# Patient Record
Sex: Female | Born: 1935 | Race: White | Hispanic: No | Smoking: Never smoker
Health system: Southern US, Community
[De-identification: ages and names within clinical notes are randomized; demographics above are authoritative.]

## PROBLEM LIST (undated history)

## (undated) DIAGNOSIS — I10 Essential (primary) hypertension: Secondary | ICD-10-CM

## (undated) DIAGNOSIS — R079 Chest pain, unspecified: Secondary | ICD-10-CM

## (undated) DIAGNOSIS — D126 Benign neoplasm of colon, unspecified: Secondary | ICD-10-CM

## (undated) DIAGNOSIS — R0602 Shortness of breath: Secondary | ICD-10-CM

## (undated) DIAGNOSIS — T8859XA Other complications of anesthesia, initial encounter: Secondary | ICD-10-CM

## (undated) DIAGNOSIS — K219 Gastro-esophageal reflux disease without esophagitis: Secondary | ICD-10-CM

## (undated) DIAGNOSIS — G4730 Sleep apnea, unspecified: Secondary | ICD-10-CM

## (undated) DIAGNOSIS — Z87442 Personal history of urinary calculi: Secondary | ICD-10-CM

## (undated) DIAGNOSIS — Z79899 Other long term (current) drug therapy: Secondary | ICD-10-CM

## (undated) DIAGNOSIS — L719 Rosacea, unspecified: Secondary | ICD-10-CM

## (undated) DIAGNOSIS — I519 Heart disease, unspecified: Secondary | ICD-10-CM

## (undated) DIAGNOSIS — I509 Heart failure, unspecified: Secondary | ICD-10-CM

## (undated) DIAGNOSIS — N200 Calculus of kidney: Principal | ICD-10-CM

## (undated) DIAGNOSIS — E785 Hyperlipidemia, unspecified: Secondary | ICD-10-CM

## (undated) DIAGNOSIS — J45909 Unspecified asthma, uncomplicated: Secondary | ICD-10-CM

## (undated) DIAGNOSIS — G5600 Carpal tunnel syndrome, unspecified upper limb: Secondary | ICD-10-CM

## (undated) DIAGNOSIS — IMO0002 Reserved for concepts with insufficient information to code with codable children: Secondary | ICD-10-CM

## (undated) DIAGNOSIS — E669 Obesity, unspecified: Secondary | ICD-10-CM

## (undated) HISTORY — DX: Heart disease, unspecified: I51.9

## (undated) HISTORY — DX: Carpal tunnel syndrome, unspecified upper limb: G56.00

## (undated) HISTORY — DX: Hyperlipidemia, unspecified: E78.5

## (undated) HISTORY — DX: Calculus of kidney: N20.0

## (undated) HISTORY — PX: TONSILLECTOMY: SUR1361

## (undated) HISTORY — DX: Reserved for concepts with insufficient information to code with codable children: IMO0002

## (undated) HISTORY — PX: ABDOMINAL HYSTERECTOMY: SHX81

## (undated) HISTORY — DX: Sleep apnea, unspecified: G47.30

## (undated) HISTORY — DX: Essential (primary) hypertension: I10

## (undated) HISTORY — DX: Gastro-esophageal reflux disease without esophagitis: K21.9

## (undated) HISTORY — DX: Benign neoplasm of colon, unspecified: D12.6

## (undated) HISTORY — DX: Unspecified asthma, uncomplicated: J45.909

## (undated) HISTORY — DX: Other long term (current) drug therapy: Z79.899

## (undated) HISTORY — DX: Obesity, unspecified: E66.9

## (undated) HISTORY — DX: Heart failure, unspecified: I50.9

## (undated) HISTORY — DX: Shortness of breath: R06.02

## (undated) HISTORY — DX: Rosacea, unspecified: L71.9

## (undated) HISTORY — DX: Chest pain, unspecified: R07.9

---

## 1998-03-27 HISTORY — PX: ESOPHAGOGASTRODUODENOSCOPY: SHX1529

## 1999-03-28 LAB — HM DEXA SCAN: HM Dexa Scan: NORMAL

## 2001-03-27 DIAGNOSIS — D126 Benign neoplasm of colon, unspecified: Secondary | ICD-10-CM

## 2001-03-27 HISTORY — DX: Benign neoplasm of colon, unspecified: D12.6

## 2002-04-27 ENCOUNTER — Encounter: Payer: Self-pay | Admitting: Family Medicine

## 2002-04-27 LAB — CONVERTED CEMR LAB

## 2002-05-27 LAB — HM MAMMOGRAPHY: HM Mammogram: NORMAL

## 2004-01-26 HISTORY — PX: UMBILICAL HERNIA REPAIR: SHX196

## 2004-01-28 ENCOUNTER — Ambulatory Visit: Payer: Self-pay | Admitting: General Surgery

## 2004-01-28 ENCOUNTER — Other Ambulatory Visit: Payer: Self-pay

## 2004-02-04 ENCOUNTER — Ambulatory Visit: Payer: Self-pay | Admitting: General Surgery

## 2004-03-03 ENCOUNTER — Ambulatory Visit: Payer: Self-pay | Admitting: Family Medicine

## 2004-03-31 ENCOUNTER — Ambulatory Visit: Payer: Self-pay | Admitting: Family Medicine

## 2004-05-17 ENCOUNTER — Ambulatory Visit: Payer: Self-pay | Admitting: Internal Medicine

## 2004-06-03 ENCOUNTER — Ambulatory Visit: Payer: Self-pay | Admitting: Internal Medicine

## 2004-10-03 ENCOUNTER — Ambulatory Visit: Payer: Self-pay | Admitting: Family Medicine

## 2005-02-15 ENCOUNTER — Ambulatory Visit: Payer: Self-pay | Admitting: Family Medicine

## 2005-02-27 ENCOUNTER — Ambulatory Visit: Payer: Self-pay | Admitting: Family Medicine

## 2005-03-27 ENCOUNTER — Ambulatory Visit: Payer: Self-pay | Admitting: Family Medicine

## 2005-04-12 ENCOUNTER — Ambulatory Visit: Payer: Self-pay | Admitting: Family Medicine

## 2005-04-27 ENCOUNTER — Ambulatory Visit: Payer: Self-pay | Admitting: Family Medicine

## 2005-05-26 ENCOUNTER — Ambulatory Visit: Payer: Self-pay | Admitting: Family Medicine

## 2005-06-07 ENCOUNTER — Ambulatory Visit: Payer: Self-pay | Admitting: Family Medicine

## 2005-07-20 ENCOUNTER — Ambulatory Visit: Payer: Self-pay | Admitting: Family Medicine

## 2005-10-03 ENCOUNTER — Ambulatory Visit: Payer: Self-pay | Admitting: Family Medicine

## 2005-10-12 ENCOUNTER — Ambulatory Visit: Payer: Self-pay | Admitting: Cardiology

## 2005-10-25 ENCOUNTER — Encounter: Payer: Self-pay | Admitting: Cardiology

## 2005-10-25 ENCOUNTER — Ambulatory Visit: Payer: Self-pay

## 2006-02-01 ENCOUNTER — Ambulatory Visit: Payer: Self-pay | Admitting: Family Medicine

## 2006-05-03 ENCOUNTER — Ambulatory Visit: Payer: Self-pay | Admitting: Family Medicine

## 2006-05-03 LAB — CONVERTED CEMR LAB
Albumin: 3.7 g/dL (ref 3.5–5.2)
Chloride: 101 meq/L (ref 96–112)
GFR calc Af Amer: 127 mL/min
GFR calc non Af Amer: 105 mL/min
Glucose, Bld: 126 mg/dL — ABNORMAL HIGH (ref 70–99)
Hgb A1c MFr Bld: 7.1 %
LDL Cholesterol: 53 mg/dL (ref 0–99)
Phosphorus: 2.8 mg/dL (ref 2.3–4.6)
Potassium: 3.6 meq/L (ref 3.5–5.1)
Sodium: 143 meq/L (ref 135–145)
Total CHOL/HDL Ratio: 2.9
Triglycerides: 151 mg/dL — ABNORMAL HIGH (ref 0–149)
VLDL: 30 mg/dL (ref 0–40)

## 2006-07-12 ENCOUNTER — Ambulatory Visit: Payer: Self-pay | Admitting: Family Medicine

## 2006-09-06 ENCOUNTER — Ambulatory Visit: Payer: Self-pay | Admitting: Family Medicine

## 2006-09-06 DIAGNOSIS — E1129 Type 2 diabetes mellitus with other diabetic kidney complication: Secondary | ICD-10-CM | POA: Insufficient documentation

## 2006-09-06 DIAGNOSIS — E119 Type 2 diabetes mellitus without complications: Secondary | ICD-10-CM

## 2006-09-10 ENCOUNTER — Encounter: Payer: Self-pay | Admitting: Family Medicine

## 2006-09-10 DIAGNOSIS — G4733 Obstructive sleep apnea (adult) (pediatric): Secondary | ICD-10-CM

## 2006-09-10 DIAGNOSIS — M5137 Other intervertebral disc degeneration, lumbosacral region: Secondary | ICD-10-CM

## 2006-09-10 DIAGNOSIS — J45909 Unspecified asthma, uncomplicated: Secondary | ICD-10-CM | POA: Insufficient documentation

## 2006-09-10 DIAGNOSIS — Z860100 Personal history of colon polyps, unspecified: Secondary | ICD-10-CM | POA: Insufficient documentation

## 2006-09-10 DIAGNOSIS — K219 Gastro-esophageal reflux disease without esophagitis: Secondary | ICD-10-CM

## 2006-09-10 DIAGNOSIS — E6609 Other obesity due to excess calories: Secondary | ICD-10-CM | POA: Insufficient documentation

## 2006-09-10 DIAGNOSIS — L719 Rosacea, unspecified: Secondary | ICD-10-CM

## 2006-09-10 DIAGNOSIS — E8770 Fluid overload, unspecified: Secondary | ICD-10-CM | POA: Insufficient documentation

## 2006-09-10 DIAGNOSIS — I10 Essential (primary) hypertension: Secondary | ICD-10-CM | POA: Insufficient documentation

## 2006-09-10 DIAGNOSIS — E66811 Obesity, class 1: Secondary | ICD-10-CM | POA: Insufficient documentation

## 2006-09-10 DIAGNOSIS — G5600 Carpal tunnel syndrome, unspecified upper limb: Secondary | ICD-10-CM

## 2006-09-10 DIAGNOSIS — I519 Heart disease, unspecified: Secondary | ICD-10-CM

## 2006-09-10 DIAGNOSIS — M51379 Other intervertebral disc degeneration, lumbosacral region without mention of lumbar back pain or lower extremity pain: Secondary | ICD-10-CM | POA: Insufficient documentation

## 2006-09-10 LAB — CONVERTED CEMR LAB
Calcium: 9.9 mg/dL (ref 8.4–10.5)
Chloride: 103 meq/L (ref 96–112)
Creatinine, Ser: 0.8 mg/dL (ref 0.4–1.2)
Glucose, Bld: 141 mg/dL — ABNORMAL HIGH (ref 70–99)
Sodium: 140 meq/L (ref 135–145)

## 2006-09-13 ENCOUNTER — Ambulatory Visit: Payer: Self-pay | Admitting: Family Medicine

## 2006-09-13 DIAGNOSIS — E1169 Type 2 diabetes mellitus with other specified complication: Secondary | ICD-10-CM

## 2006-09-13 DIAGNOSIS — E785 Hyperlipidemia, unspecified: Secondary | ICD-10-CM

## 2006-12-20 ENCOUNTER — Ambulatory Visit: Payer: Self-pay | Admitting: Family Medicine

## 2007-01-01 LAB — CONVERTED CEMR LAB
ALT: 23 units/L (ref 0–35)
AST: 18 units/L (ref 0–37)
Cholesterol: 111 mg/dL (ref 0–200)
Creatinine,U: 178.9 mg/dL
Hgb A1c MFr Bld: 6.7 % — ABNORMAL HIGH (ref 4.6–6.0)
Microalb, Ur: 2.4 mg/dL — ABNORMAL HIGH (ref 0.0–1.9)
Total CHOL/HDL Ratio: 2.6
Triglycerides: 112 mg/dL (ref 0–149)

## 2007-05-17 ENCOUNTER — Telehealth: Payer: Self-pay | Admitting: Family Medicine

## 2007-05-17 ENCOUNTER — Encounter: Payer: Self-pay | Admitting: Family Medicine

## 2007-10-07 ENCOUNTER — Ambulatory Visit: Payer: Self-pay | Admitting: Family Medicine

## 2007-10-08 LAB — CONVERTED CEMR LAB
ALT: 29 units/L (ref 0–35)
Albumin: 4.2 g/dL (ref 3.5–5.2)
Chloride: 108 meq/L (ref 96–112)
Creatinine,U: 128 mg/dL
Eosinophils Absolute: 0.2 10*3/uL (ref 0.0–0.7)
Eosinophils Relative: 2.6 % (ref 0.0–5.0)
GFR calc Af Amer: 106 mL/min
GFR calc non Af Amer: 88 mL/min
HCT: 41.4 % (ref 36.0–46.0)
HDL: 48.5 mg/dL (ref >39.0)
Hemoglobin: 14.4 g/dL (ref 12.0–15.0)
MCV: 93.1 fL (ref 78.0–100.0)
Microalb Creat Ratio: 37.5 mg/g — ABNORMAL HIGH (ref 0.0–30.0)
Microalb, Ur: 4.8 mg/dL — ABNORMAL HIGH (ref 0.0–1.9)
Monocytes Absolute: 0.4 10*3/uL (ref 0.1–1.0)
Monocytes Relative: 6.5 % (ref 3.0–12.0)
Neutro Abs: 3.9 10*3/uL (ref 1.4–7.7)
Phosphorus: 4.3 mg/dL (ref 2.3–4.6)
Platelets: 194 10*3/uL (ref 150–400)
Potassium: 4.1 meq/L (ref 3.5–5.1)
Sodium: 143 meq/L (ref 135–145)
Total CHOL/HDL Ratio: 2.5
Total Protein: 7 g/dL (ref 6.0–8.3)
Triglycerides: 101 mg/dL (ref 0–149)
WBC: 5.8 10*3/uL (ref 4.5–10.5)

## 2007-10-22 ENCOUNTER — Encounter: Payer: Self-pay | Admitting: Family Medicine

## 2007-10-31 ENCOUNTER — Telehealth (INDEPENDENT_AMBULATORY_CARE_PROVIDER_SITE_OTHER): Payer: Self-pay | Admitting: *Deleted

## 2007-12-10 ENCOUNTER — Ambulatory Visit: Payer: Self-pay | Admitting: Family Medicine

## 2008-01-14 ENCOUNTER — Ambulatory Visit: Payer: Self-pay | Admitting: Family Medicine

## 2008-01-15 LAB — CONVERTED CEMR LAB
Creatinine,U: 23.3 mg/dL
Hgb A1c MFr Bld: 6.7 % — ABNORMAL HIGH (ref 4.6–6.0)

## 2008-02-28 ENCOUNTER — Telehealth: Payer: Self-pay | Admitting: Family Medicine

## 2008-04-02 ENCOUNTER — Encounter: Payer: Self-pay | Admitting: Family Medicine

## 2008-05-25 ENCOUNTER — Encounter: Payer: Self-pay | Admitting: Family Medicine

## 2008-11-03 ENCOUNTER — Telehealth: Payer: Self-pay | Admitting: Family Medicine

## 2008-11-04 ENCOUNTER — Ambulatory Visit: Payer: Self-pay | Admitting: Family Medicine

## 2008-11-04 DIAGNOSIS — D126 Benign neoplasm of colon, unspecified: Secondary | ICD-10-CM | POA: Insufficient documentation

## 2008-11-05 LAB — CONVERTED CEMR LAB
ALT: 31 units/L (ref 0–35)
Bilirubin, Direct: 0.2 mg/dL (ref 0.0–0.3)
CO2: 32 meq/L (ref 19–32)
Chloride: 107 meq/L (ref 96–112)
Cholesterol: 123 mg/dL (ref 0–200)
Creatinine, Ser: 0.8 mg/dL (ref 0.4–1.2)
LDL Cholesterol: 44 mg/dL (ref 0–99)
Phosphorus: 3.4 mg/dL (ref 2.3–4.6)
Sodium: 144 meq/L (ref 135–145)
Total Bilirubin: 1.3 mg/dL — ABNORMAL HIGH (ref 0.3–1.2)
Total CHOL/HDL Ratio: 3
Triglycerides: 169 mg/dL — ABNORMAL HIGH (ref 0.0–149.0)

## 2008-11-06 ENCOUNTER — Telehealth: Payer: Self-pay | Admitting: Family Medicine

## 2008-12-04 ENCOUNTER — Encounter: Payer: Self-pay | Admitting: Family Medicine

## 2009-02-15 ENCOUNTER — Telehealth (INDEPENDENT_AMBULATORY_CARE_PROVIDER_SITE_OTHER): Payer: Self-pay | Admitting: *Deleted

## 2009-03-24 ENCOUNTER — Telehealth: Payer: Self-pay | Admitting: Family Medicine

## 2009-05-10 ENCOUNTER — Ambulatory Visit: Payer: Self-pay | Admitting: Family Medicine

## 2009-05-11 ENCOUNTER — Encounter (INDEPENDENT_AMBULATORY_CARE_PROVIDER_SITE_OTHER): Payer: Self-pay | Admitting: *Deleted

## 2009-05-11 LAB — CONVERTED CEMR LAB
Albumin: 3.9 g/dL (ref 3.5–5.2)
BUN: 13 mg/dL (ref 6–23)
Chloride: 109 meq/L (ref 96–112)
Creatinine, Ser: 0.8 mg/dL (ref 0.4–1.2)
GFR calc non Af Amer: 74.83 mL/min (ref >60)
Glucose, Bld: 156 mg/dL — ABNORMAL HIGH (ref 70–99)
Hgb A1c MFr Bld: 7.3 % — ABNORMAL HIGH (ref 4.6–6.5)
LDL Cholesterol: 44 mg/dL (ref 0–99)
Phosphorus: 3.5 mg/dL (ref 2.3–4.6)
Total CHOL/HDL Ratio: 2
Triglycerides: 113 mg/dL (ref 0.0–149.0)
VLDL: 22.6 mg/dL (ref 0.0–40.0)

## 2009-05-12 ENCOUNTER — Ambulatory Visit: Payer: Self-pay | Admitting: Internal Medicine

## 2009-05-12 ENCOUNTER — Encounter (INDEPENDENT_AMBULATORY_CARE_PROVIDER_SITE_OTHER): Payer: Self-pay | Admitting: *Deleted

## 2009-05-26 ENCOUNTER — Ambulatory Visit: Payer: Self-pay | Admitting: Internal Medicine

## 2009-05-28 ENCOUNTER — Encounter: Payer: Self-pay | Admitting: Internal Medicine

## 2009-06-24 ENCOUNTER — Telehealth: Payer: Self-pay | Admitting: Family Medicine

## 2009-07-21 ENCOUNTER — Telehealth: Payer: Self-pay | Admitting: Family Medicine

## 2009-07-22 ENCOUNTER — Encounter: Payer: Self-pay | Admitting: Family Medicine

## 2009-07-23 ENCOUNTER — Ambulatory Visit: Payer: Self-pay | Admitting: Family Medicine

## 2009-07-23 DIAGNOSIS — R079 Chest pain, unspecified: Secondary | ICD-10-CM

## 2009-07-23 DIAGNOSIS — R0602 Shortness of breath: Secondary | ICD-10-CM

## 2009-07-23 LAB — HM DIABETES FOOT EXAM

## 2009-07-29 ENCOUNTER — Ambulatory Visit: Payer: Self-pay | Admitting: Cardiovascular Disease

## 2009-07-29 DIAGNOSIS — I5032 Chronic diastolic (congestive) heart failure: Secondary | ICD-10-CM

## 2009-08-04 ENCOUNTER — Ambulatory Visit: Payer: Self-pay | Admitting: Family Medicine

## 2009-08-05 LAB — CONVERTED CEMR LAB: Potassium: 4.5 meq/L (ref 3.5–5.1)

## 2009-08-20 ENCOUNTER — Encounter: Payer: Self-pay | Admitting: Cardiovascular Disease

## 2009-08-20 ENCOUNTER — Ambulatory Visit: Payer: Self-pay

## 2009-08-20 ENCOUNTER — Ambulatory Visit (HOSPITAL_COMMUNITY): Admission: RE | Admit: 2009-08-20 | Discharge: 2009-08-20 | Payer: Self-pay | Admitting: Cardiovascular Disease

## 2009-08-20 ENCOUNTER — Ambulatory Visit: Payer: Self-pay | Admitting: Cardiovascular Disease

## 2009-08-25 ENCOUNTER — Encounter (INDEPENDENT_AMBULATORY_CARE_PROVIDER_SITE_OTHER): Payer: Self-pay | Admitting: *Deleted

## 2009-08-25 ENCOUNTER — Telehealth: Payer: Self-pay | Admitting: Family Medicine

## 2009-09-10 ENCOUNTER — Telehealth: Payer: Self-pay | Admitting: Family Medicine

## 2009-11-25 ENCOUNTER — Encounter: Payer: Self-pay | Admitting: Family Medicine

## 2010-04-24 LAB — CONVERTED CEMR LAB
Albumin: 3.8 g/dL (ref 3.5–5.2)
BUN: 12 mg/dL (ref 6–23)
CO2: 28 meq/L (ref 19–32)
Calcium: 9.5 mg/dL (ref 8.4–10.5)
Chloride: 107 meq/L (ref 96–112)
Creatinine, Ser: 0.7 mg/dL (ref 0.4–1.2)

## 2010-04-26 NOTE — Letter (Signed)
Summary: [facility] Instructions  [facility]  [address] [place], [place] [address]   Phone: [phone]  Fax: [phone]       Gabriella Howe    Aug 01, 1934    MRN: [id number]       Procedure Day Dorna Bloom:Wednesday 05/26/2009     Arrival Time: 8:00 am     Procedure Time:  9:00 am    Location of Procedure:                    _ x_  [facility] (4th Floor)   PREPARATION FOR COLONOSCOPY WITH MIRALAX  Starting 5 days prior to your procedure Friday 2/25 do not eat nuts, seeds, popcorn, corn, beans, peas,  salads, or any raw vegetables.  Do not take any fiber supplements (e.g. Metamucil, Citrucel, and Benefiber). ____________________________________________________________________________________________________   THE DAY BEFORE YOUR PROCEDURE         DATE:Tuesday 3/1  1   Drink clear liquids the entire day-NO SOLID FOOD  2   Do not drink anything colored red or purple.  Avoid juices with pulp.  No orange juice.  3   Drink at least 64 oz. (8 glasses) of fluid/clear liquids during the day to prevent dehydration and help the prep work efficiently.  CLEAR LIQUIDS INCLUDE: Water Jello Ice Popsicles Tea (sugar ok, no milk/cream) Powdered fruit flavored drinks Coffee (sugar ok, no milk/cream) Gatorade Juice: apple, white grape, white cranberry  Lemonade Clear bullion, consomm, broth Carbonated beverages (any kind) Strained chicken noodle soup Hard Candy  4   Mix the entire bottle of Miralax with 64 oz. of Gatorade/Powerade in the morning and put in the refrigerator to chill.  5   At 3:00 pm take 2 Dulcolax/Bisacodyl tablets.  6   At 4:30 pm take one Reglan/Metoclopramide tablet.  7  Starting at 5:00 pm drink one 8 oz glass of the Miralax mixture every 15-20 minutes until you have finished drinking the entire 64 oz.  You should finish drinking prep around 7:30 or 8:00 pm.  8   If you are nauseated, you may take the 2nd Reglan/Metoclopramide tablet at  6:30 pm.        9    At 8:00 pm take 2 more DULCOLAX/Bisacodyl tablets.     THE DAY OF YOUR PROCEDURE      DATE:  Wednesday 3/2  You may drink clear liquids until 7:00 am   (2 HOURS BEFORE PROCEDURE).   MEDICATION INSTRUCTIONS  Unless otherwise instructed, you should take regular prescription medications with a small sip of water as early as possible the morning of your procedure.  Diabetic patients - see separate instructions.    Additional medication instructions:  do not take Lasix morning of procedure.         OTHER INSTRUCTIONS  You will need a responsible adult at least 74 years of age to accompany you and drive you home.   This person must remain in the waiting room during your procedure.  Wear loose fitting clothing that is easily removed.  Leave jewelry and other valuables at home.  However, you may wish to bring a book to read or an iPod/MP3 player to listen to music as you wait for your procedure to start.  Remove all body piercing jewelry and leave at home.  Total time from sign-in until discharge is approximately 2-3 hours.  You should go home directly after your procedure and rest.  You can resume normal activities the day  after your procedure.  The day of your procedure you should not:   Drive   Make legal decisions   Operate machinery   Drink alcohol   Return to work  You will receive specific instructions about eating, activities and medications before you leave.   The above instructions have been reviewed and explained to me by  Ezra Sites RN  May 12, 2009 2:58 PM   I fully understand and can verbalize these instructions _____________________________ Date _______

## 2010-04-26 NOTE — Assessment & Plan Note (Signed)
Summary: RETAINING FLUID/DLO   Vital Signs:  Patient profile:   76 year old female Height:      63 inches Weight:      228.25 pounds BMI:     40.58 O2 Sat:      95 % on Room air Temp:     98.4 degrees F oral Pulse rate:   76 / minute Pulse rhythm:   regular Resp:     20 per minute BP sitting:   110 / 70  (left arm) Cuff size:   large  Vitals Entered By: Lewanda Rife LPN (July 23, 2009 11:32 AM)  O2 Flow:  Room air CC: retaining fluid in legs and chest. Pt states upon exertion trouble breathing and feels pressure in chest like elephant sitting on chest.   History of Present Illness: here for fluid retention and sob  this started slowly for at least 3 weeks -- kept building and building  big ankles and feet - fingers are swollen  gained 10 lb by her scales (4 by ours)  chest is feeling heavy - no pain at all   sob gets worse when she exerts herself   has hx of chf - takes 20 lasix per day-- did take 40 yesterday - and has helped some - breathing and pressure are quite improved  has not taken any lasix today - has to bring mother home from the hospital (kidney fxn )  last echol 07 mild diast dysf, with nl nuc study (with [place])  no sweating , nausea, dizziness or other symptoms    last lipids LDL 44 on simvastatin   bp good 110/70  wt is up 4 lb  DM - last AIC 7.3 in feb  sugars run very well when not stressed    does not know what brought this on  did not eat salty food  has not been drinking enough water    Allergies: 1)  ! * Td Shot 2)  * Antihistamines 3)  Vioxx  Past History:  Past Medical History: Last updated: 09/10/2006 Colonic polyps, hx of Congestive heart failure Diabetes mellitus, type II GERD Hypertension  Past Surgical History: Last updated: 09/10/2006 Hysterectomy- partial, fibroids EGD (2000) Colonoscopy- polyps (2000,  2003),   negative (05/2004) Dexa- ok per pt (2001) Umbiical hernia repair (01/2004) [place]- fatty liver  (2005) Echo- normal EF 65-70%, mild diastolic dys, nuclear study normal (10/2005)  Family History: Last updated: 09/10/2006 Father: killed in accident Mother: HTN, DM Siblings:   Social History: Last updated: 10/07/2007 Marital Status: widowed Children: 1 daughter Occupation: retired non smoker no alcohol  Risk Factors: Smoking Status: never (09/10/2006)  Review of Systems General:  Complains of fatigue; denies chills, fever, loss of appetite, malaise, sweats, weakness, and weight loss. Eyes:  Denies blurring and eye pain. CV:  Denies chest pain or discomfort, palpitations, and shortness of breath with exertion. Resp:  Complains of shortness of breath; denies cough, pleuritic, sputum productive, and wheezing. GI:  Denies abdominal pain, bloody stools, change in bowel habits, indigestion, loss of appetite, and nausea. MS:  Denies muscle aches, cramps, and muscle weakness. Derm:  Denies lesion(s), poor wound healing, and rash. Neuro:  Denies headaches, inability to speak, numbness, tingling, and weakness. Psych:  is stressed about caring for her mother . Endo:  Denies cold intolerance, excessive thirst, excessive urination, and heat intolerance. Heme:  Denies abnormal bruising and bleeding.  Physical Exam  General:  overwt but well app not sob on exam  Head:  normocephalic, atraumatic, and no abnormalities observed.   Eyes:  vision grossly intact, pupils equal, pupils round, and pupils reactive to light.  no conjunctival pallor, injection or icterus  Neck:  supple with full rom and no masses or thyromegally, no JVD or carotid bruit  Chest Wall:  No deformities, masses, or tenderness noted. Lungs:  Normal respiratory effort, chest expands symmetrically. Lungs are clear to auscultation, no crackles or wheezes. Heart:  Normal rate and regular rhythm. S1 and S2 normal without gallop, murmur, click, rub or other extra sounds. Abdomen:  Bowel sounds positive,abdomen soft and  non-tender without masses, organomegaly or hernias noted. no renal bruits  Msk:  No deformity or scoliosis noted of thoracic or lumbar spine.   Pulses:  R and L carotid,radial,femoral,dorsalis pedis and posterior tibial pulses are full and equal bilaterally Extremities:  one plus pedal edema - per pt this is improved from yesterday Neurologic:  sensation intact to light touch, gait normal, and DTRs symmetrical and normal.   Skin:  Intact without suspicious lesions or rashes Cervical Nodes:  No lymphadenopathy noted Psych:  normal affect, talkative and pleasant  is stressed over getting to hospital to take care of her mom- she voices this but does not feel overly anx  Diabetes Management Exam:    Foot Exam (with socks and/or shoes not present):       Sensory-Pinprick/Light touch:          Left medial foot (L-4): normal          Left dorsal foot (L-5): normal          Left lateral foot (S-1): normal          Right medial foot (L-4): normal          Right dorsal foot (L-5): normal          Right lateral foot (S-1): normal       Sensory-Monofilament:          Left foot: normal          Right foot: normal       Inspection:          Left foot: normal          Right foot: normal       Nails:          Left foot: normal          Right foot: normal   Impression & Recommendations:  Problem # 1:  DYSPNEA (ICD-786.05) Assessment New with leg swelling and hx of diastolic dysf in 07 (nl nuc stress test )  pt has multiple cardiovasc risk factors  going on for 3 weeks with some chest pressure overal improved after inc her lasix for one day --will continue the 40 mg dose over wkend lungs sound fairly clear with re- assuring cxr lab now incl bnp no acute changes on ekg  ref to cardiol asap (pt taking her mother home from hosp- cannot go today)  Orders: CXR- 2view (CXR) Venipuncture (45409) TLB-Renal Function Panel (80069-RENAL) TLB-BNP (B-Natriuretic Peptide) (83880-BNPR) Cardiology  Referral (Cardiology) EKG w/ Interpretation (93000)  Problem # 2:  CHEST PAIN (ICD-786.50) Assessment: New see above - pressure that is rel to her sob -- improved after dose of lasix  Orders: Cardiology Referral (Cardiology) EKG w/ Interpretation (93000)  Problem # 3:  HYPERTENSION (ICD-401.9) Assessment: Unchanged  good control today  labs done  Her updated medication list for this problem includes:    Lasix 40 Mg Tabs (  Furosemide) .Marland Kitchen... Take one  by mouth daily    Benazepril Hcl 20 Mg Tabs (Benazepril hcl) .Marland Kitchen... 1 by mouth once daily  BP today: 110/70 Prior BP: 128/76 (05/10/2009)  Labs Reviewed: K+: 4.0 (05/10/2009) Creat: : 0.8 (05/10/2009)   Chol: 122 (05/10/2009)   HDL: 55.60 (05/10/2009)   LDL: 44 (05/10/2009)   TG: 113.0 (05/10/2009)  Complete Medication List: 1)  Prilosec 20 Mg Cpdr (Omeprazole) .... Take one by mouth daily 2)  Lasix 40 Mg Tabs (Furosemide) .... Take one  by mouth daily 3)  Simvastatin 40 Mg Tabs (Simvastatin) .... Take one by mouth daily 4)  Fish Oil Caps (Omega-3 fatty acids caps) .... Take by mouth as directed 5)  Calcium-vitamin D Tabs (Calcium-vitamin d tabs) .... Take by mouth as directed 6)  Metformin Hcl 500 Mg Tabs (Metformin hcl) .Marland Kitchen.. 1 by mouth two times a day 7)  Multivitamins Tabs (Multiple vitamin) .... Take one by mouth daily 8)  Vitamin C 500 Mg Tabs (Ascorbic acid) .... Take one by mouth daily 9)  Vitamin E 400 Unit Caps (Vitamin e) .... Take one by mouth daily 10)  Benazepril Hcl 20 Mg Tabs (Benazepril hcl) .Marland Kitchen.. 1 by mouth once daily 11)  Metrogel 1 % Gel (Metronidazole) .... Apply to affected area on face once daily for rosacea  Patient Instructions: 1)  we will do cardiology referral at check out  2)  increase your lasix to 40 mg daily -- take it when you get home today 3)  labs today 4)  if you worsen or chest pain or fever or other symptoms - go to ER   Current Allergies (reviewed today): ! * TD SHOT *  ANTIHISTAMINES VIOXX   EKG  Procedure date:  07/23/2009  Findings:      NSR with few PVCs- rate of 79 no acute changes   CXR  Procedure date:  07/23/2009  Findings:      CXR pa and lat  no acute changes  scoliosis noted  no fluid seen in fissures/ no infiltrate seen pend rad eval

## 2010-04-26 NOTE — Letter (Signed)
Summary: Diabetic Instructions  [facility]  [address] [place], [place] [address]   Phone: [phone]  Fax: [phone]    SAMIAH RICKLEFS 1934-05-17 MRN: [id number]   _  _   ORAL DIABETIC MEDICATION INSTRUCTIONS  The day before your procedure:   Take your diabetic pill as you do normally  The day of your procedure:   Do not take your diabetic pill    We will check your blood sugar levels during the admission process and again in Recovery before discharging you home  ________________________________________________________________________

## 2010-04-26 NOTE — Letter (Signed)
Summary: Patient Notice- Polyp Results  [facility]  [address] [place], [place] [address]   Phone: [phone]  Fax: [phone]        May 28, 2009 MRN: [id number]    [facility] [address] [place], [place]  [address]    Dear Ms. Berzins,  I am pleased to inform you that the colon polyp(s) removed during your recent colonoscopy was (were) found to be benign (no cancer detected) upon pathologic examination.The polyp was adenomatous ( precancerous)  I recommend you have a repeat colonoscopy examination in 5_ years to look for recurrent polyps, as having colon polyps increases your risk for having recurrent polyps or even colon cancer in the future.  Should you develop new or worsening symptoms of abdominal pain, bowel habit changes or bleeding from the rectum or bowels, please schedule an evaluation with either your primary care physician or with me.  Additional information/recommendations:  _x_ No further action with gastroenterology is needed at this time. Please      follow-up with your primary care physician for your other healthcare      needs.  __ Please call [phone] to schedule a return visit to review your      situation.  __ Please keep your follow-up visit as already scheduled.  __ Continue treatment plan as outlined the day of your exam.  Please call us if you are having persistent problems or have questions about your condition that have not been fully answered at this time.  Sincerely,  Hart Carwin MD  This letter has been electronically signed by your physician.  Appended Document: Patient Notice- Polyp Results letter mailed 3.7.11

## 2010-04-26 NOTE — Progress Notes (Signed)
Summary: furosemide  Phone Note From Pharmacy   Caller: [facility] Summary of Call: Form from [facility] is on your desk.  They are requesting a new script for furosemide, Dr. Royden Purl pt. Initial call taken by: Lowella Petties CMA,  September 10, 2009 10:52 AM  Follow-up for Phone Call        on my desk. Ruthe Mannan MD  September 10, 2009 10:58 AM     Prescriptions: LASIX 40 MG  TABS (FUROSEMIDE) take one  by mouth daily  #90 x 0   Entered by:   Lowella Petties CMA   Authorized by:   Ruthe Mannan MD   Signed by:   Lowella Petties CMA on 09/10/2009   Method used:   Telephoned to ...       [facility]* (mail-order)       [address]       [place], [place]  [address]       Ph: [phone]       Fax: [phone]   RxID:   [id number]

## 2010-04-26 NOTE — Letter (Signed)
Summary: [facility]   Imported By: Sherian Rein 12/10/2009 11:42:37  _____________________________________________________________________  External Attachment:    Type:   Image     Comment:   External Document  Appended Document: [facility]    Clinical Lists Changes  Observations: Added new observation of DMEYEEXAMNXT: 11/2010 (12/12/2009 17:28) Added new observation of DMEYEEXMRES: normal (11/25/2009 17:28) Added new observation of EYE EXAM BY: Dr Druscilla Brownie (11/25/2009 17:28) Added new observation of DIAB EYE EX: normal (11/25/2009 17:28)        Diabetes Management Exam:    Eye Exam:       Eye Exam done elsewhere          Date: 11/25/2009          Results: normal          Done by: Dr Druscilla Brownie

## 2010-04-26 NOTE — Assessment & Plan Note (Signed)
Summary: np6/chest pain    Visit Type:  Initial Consult Primary Provider:  Colon Flattery Tower MD  CC:  Chest pressure and Sob. Legs and ankles edema.  History of Present Illness: 76 year-old woman presenting for initial evaluation of of chest pressure, edema, and shortness of breath. She was seen last week by Dr Milinda Antis, and the pt was felt to be volume overloaded. Treatment with lasix was started (dose increased from 20 to 40 mg) and the pt has noted some improvement since then.  She also noted a weight gain of approximately 10 pounds.  She describes a 3 week history of worsening leg edema before starting furosemide. She also had worsening shortness of breath and chest pressure, now improved with increased diuretics.   She was hospitalized in 2000 in [place] with CHF - reports she was taking Vioxx at that time and improved after diuresis in the hospital and discontinuing Vioxx. She had been on furosemide 20 mg daily for the past 8 or 9 years.  Review of records shows cardiac eval by Dr Antoine Poche in 2007 for similar symptoms - Myoview at that time showed no ischemia and echo showed preserved LV systolic function but did show Grade II diastolic dysfunction.  Current Medications (verified): 1)  Prilosec 20 Mg  Cpdr (Omeprazole) .... Take One By Mouth Daily 2)  Lasix 40 Mg  Tabs (Furosemide) .... Take One  By Mouth Daily 3)  Simvastatin 40 Mg  Tabs (Simvastatin) .... Take One By Mouth Daily 4)  Fish Oil   Caps (Omega-3 Fatty Acids Caps) .... Take By Mouth As Directed 5)  Calcium-Vitamin D   Tabs (Calcium-Vitamin D Tabs) .... Take By Mouth As Directed 6)  Metformin Hcl 500 Mg  Tabs (Metformin Hcl) .Marland Kitchen.. 1 By Mouth Two Times A Day 7)  Multivitamins   Tabs (Multiple Vitamin) .... Take One By Mouth Daily 8)  Vitamin C 500 Mg  Tabs (Ascorbic Acid) .... Take One By Mouth Daily 9)  Vitamin E 400 Unit  Caps (Vitamin E) .... Take One By Mouth Daily 10)  Benazepril Hcl 20 Mg  Tabs (Benazepril Hcl) .Marland Kitchen.. 1 By  Mouth Once Daily 11)  Metrogel 1 % Gel (Metronidazole) .... Apply To Affected Area On Face Once Daily For Rosacea  Allergies: 1)  ! * Td Shot 2)  * Antihistamines 3)  Vioxx  Past History:  Past medical, surgical, family and social histories (including risk factors) reviewed, and no changes noted (except as noted below).  Past Medical History: Current Problems:  CONGESTIVE HEART FAILURE (ICD-428.0), diastolic Hx of DIASTOLIC DYSFUNCTION (ICD-429.9) HYPERTENSION (ICD-401.9) HYPERCHOLESTEROLEMIA, PURE (ICD-272.0) CHEST PAIN (ICD-786.50) DYSPNEA (ICD-786.05) Hx of FLUID RETENTION (ICD-276.6) GERD (ICD-530.81) Hx of OBESITY (ICD-278.00) DIABETES MELLITUS, TYPE II (ICD-250.00) Hx of REACTIVE AIRWAY DISEASE (ICD-493.90) COLONIC POLYPS (ICD-211.3) Hx of SLEEP APNEA (ICD-780.57) Hx of CARPAL TUNNEL SYNDROME (ICD-354.0) Hx of ROSACEA (ICD-695.3) Hx of DEGENERATIVE DISC DISEASE (ICD-722.6) COLONIC POLYPS, HX OF (ICD-V12.72) AFTERCARE, LONG-TERM USE, MEDICATIONS NEC (ICD-V58.69)  Past Surgical History: Reviewed history from 07/28/2009 and no changes required. Hysterectomy- partial, fibroids EGD (2000) Colonoscopy- polyps (2000,  2003),   negative (05/2004) Dexa- ok per pt (2001) Umbiical hernia repair (01/2004) [place]- fatty liver (2005) Echo- normal EF 65-70%, mild diastolic dys, nuclear study normal (10/2005)  Family History: Reviewed history from 07/28/2009 and no changes required.  Noncontributory for early coronary artery disease.  All of  her relatives have diabetes and hypertension.  Her father was killed at 76, hit by a train.  Social History: Reviewed history from 07/28/2009 and no changes required. Marital Status: widowed Children: 1 daughter Occupation: retired non smoker no alcohol  Review of Systems       Negative except as per HPI   Vital Signs:  Patient profile:   76 year old female Height:      63 inches Weight:      224.75 pounds BMI:      39.96 Pulse rate:   77 / minute Pulse rhythm:   regular Resp:     20 per minute BP sitting:   138 / 79  (left arm) Cuff size:   large  Vitals Entered By: Vikki Ports (Jul 29, 2009 3:01 PM)  Physical Exam  General:  Pt is alert and oriented, obese woman, in no acute distress. HEENT: normal Neck: normal carotid upstrokes without bruits, JVP normal Lungs: CTA CV: RRR without murmur or gallop Abd: soft, NT, positive BS, no bruit, no organomegaly Ext: no clubbing, cyanosis, or edema. peripheral pulses 2+ and equal Skin: warm and dry without rash    EKG  Procedure date:  07/29/2009  Findings:      NSR, within normal limits, HR 77 bpm.  Echocardiogram  Procedure date:  10/25/2005  Findings:      SUMMARY   -  Overall left ventricular systolic function was vigorous. Left         ventricular ejection fraction was estimated , range being 65         % to 70 %. There was no diagnostic evidence of left         ventricular regional wall motion abnormalities. Left         ventricular wall thickness was mildly to moderately         increased. There was an increased relative contribution of         atrial contraction to left ventricular filling consitent with         grade 1-2 diastolic dysfunction.   -  The left atrium was mildly dilated.  Impression & Recommendations:  Problem # 1:  DIASTOLIC HEART FAILURE, CHRONIC (ICD-428.32) The patient has a typical history of CHF, with symptoms improving with diuretics. Her LVEF has been preserved in the past and ischemia eval was negative (normal Myoview scan 2007). Risk factors for diastolic HF include obesity, HTN, and Diabetes. She clinically appears well-compensated at present with controlled BP and appears euvolemic on exam. Recommend continue current medical therapy with daily lasix and benazepril. Would evaluate with an exercise stress echo to rule out inducible ischemia as it has been 4 years since her last ischemic eval and she has  diabetes.  She states she can walk on a treadmill for 15 minutes, so will try an exercise stress rather than pharmacologic.  Her updated medication list for this problem includes:    Lasix 40 Mg Tabs (Furosemide) .Marland Kitchen... Take one  by mouth daily    Benazepril Hcl 20 Mg Tabs (Benazepril hcl) .Marland Kitchen... 1 by mouth once daily  Problem # 2:  HYPERTENSION (ICD-401.9) BP high-normal today but was in good range at recent office visit with Dr Milinda Antis. Continue to follow.  Her updated medication list for this problem includes:    Lasix 40 Mg Tabs (Furosemide) .Marland Kitchen... Take one  by mouth daily    Benazepril Hcl 20 Mg Tabs (Benazepril hcl) .Marland Kitchen... 1 by mouth once daily  Orders: Stress Echo (Stress Echo)  BP today: 138/79 Prior BP: 110/70 (07/23/2009)  Labs Reviewed: K+:  4.0 (07/23/2009) Creat: : 0.7 (07/23/2009)   Chol: 122 (05/10/2009)   HDL: 55.60 (05/10/2009)   LDL: 44 (05/10/2009)   TG: 113.0 (05/10/2009)  Patient Instructions: 1)  Your physician has requested that you have a stress echocardiogram. For further information please visit [email or web address]  Please follow instruction sheet as given. 2)  Your physician recommends that you continue on your current medications as directed. Please refer to the Current Medication list given to you today. 3)  Your physician wants you to follow-up in:   6 MONTHS. You will receive a reminder letter in the mail two months in advance. If you don't receive a letter, please call our office to schedule the follow-up appointment.

## 2010-04-26 NOTE — Progress Notes (Signed)
Summary: refill request for lasix  Phone Note Refill Request Message from:  Fax from Pharmacy  Refills Requested: Medication #1:  LASIX 40 MG  TABS take one half by mouth daily Faxed form from [facility] is on your shelf.  Initial call taken by: Lowella Petties CMA,  June 24, 2009 9:54 AM  Follow-up for Phone Call        form done and in nurse in box   Follow-up by: Judith Part MD,  June 24, 2009 1:26 PM  Additional Follow-up for Phone Call Additional follow up Details #1::        Completed form faxed to [phone] by Lyla Son.Lewanda Rife LPN  June 25, 7369 8:26 AM     Prescriptions: LASIX 40 MG  TABS (FUROSEMIDE) take one half by mouth daily  #45 x 3   Entered and Authorized by:   Judith Part MD   Signed by:   Lewanda Rife LPN on 09/20/9483   Method used:   Historical   RxID:   [id number]

## 2010-04-26 NOTE — Progress Notes (Signed)
Summary: regarding metrogel  Phone Note From Pharmacy   Caller: [facility]  [phone] Summary of Call: Pharmacy is asking if they can change pt's metrogel script from 1% to .75%.  The one percent doesnt come in generic and is quite expensive, the .75 per cent does come in generic.  OK to change? Initial call taken by: Lowella Petties CMA,  August 25, 2009 12:47 PM  Follow-up for Phone Call        that is fine with me please change on med list  Follow-up by: Judith Part MD,  August 25, 2009 1:00 PM  Additional Follow-up for Phone Call Additional follow up Details #1::        Pharmacy advised. Additional Follow-up by: Delilah Shan CMA Duncan Dull),  August 25, 2009 2:47 PM

## 2010-04-26 NOTE — Miscellaneous (Signed)
  Clinical Lists Changes  Medications: Added new medication of METROGEL-VAGINAL 0.75 % GEL (METRONIDAZOLE) Apply to affected area on face once daily for Rosacea Removed medication of METROGEL 1 % GEL (METRONIDAZOLE) apply to affected area on face once daily for rosacea

## 2010-04-26 NOTE — Assessment & Plan Note (Signed)
Summary: F/U DLO   Vital Signs:  Patient profile:   76 year old female Height:      63 inches Weight:      224.25 pounds BMI:     39.87 Temp:     98.3 degrees F oral Pulse rate:   76 / minute Pulse rhythm:   regular BP sitting:   128 / 76  (left arm) Cuff size:   large  Vitals Entered By: Lewanda Rife LPN (May 10, 2009 9:19 AM)  History of Present Illness: here for f/u of lipids/ DM and HTN  is feeling ok - nothing new   wt is up 3 lb today over the holidays diet was not good  is better now - staying away from sweets- but that is her downfall   bp 128/76- well controlled/ no ha or swelling   gets on treadmills 3-4 times per week 15-20 min    imms ? last Td -- 20 years ago  is interested in flu shot  thinks she had pneumovax 6 years ago  opthy is up to date   sugars in am 120s-150  needs px for metrogel for rosacea  Allergies: 1)  ! * Td Shot 2)  * Antihistamines 3)  Vioxx  Past History:  Past Medical History: Last updated: 09/10/2006 Colonic polyps, hx of Congestive heart failure Diabetes mellitus, type II GERD Hypertension  Past Surgical History: Last updated: 09/10/2006 Hysterectomy- partial, fibroids EGD (2000) Colonoscopy- polyps (2000,  2003),   negative (05/2004) Dexa- ok per pt (2001) Umbiical hernia repair (01/2004) [place]- fatty liver (2005) Echo- normal EF 65-70%, mild diastolic dys, nuclear study normal (10/2005)  Family History: Last updated: 09/10/2006 Father: killed in accident Mother: HTN, DM Siblings:   Social History: Last updated: 10/07/2007 Marital Status: widowed Children: 1 daughter Occupation: retired non smoker no alcohol  Risk Factors: Smoking Status: never (09/10/2006)  Review of Systems General:  Denies fatigue, fever, loss of appetite, and malaise. Eyes:  Denies blurring and eye pain. CV:  Denies chest pain or discomfort, lightheadness, and palpitations. Resp:  Denies cough and wheezing. GI:  Denies  abdominal pain, bloody stools, change in bowel habits, and indigestion. GU:  Denies abnormal vaginal bleeding and dysuria. MS:  Denies joint pain, joint redness, joint swelling, and muscle aches. Derm:  Denies itching, lesion(s), poor wound healing, and rash. Neuro:  Denies numbness and tingling. Psych:  Denies anxiety and depression. Endo:  Denies cold intolerance, excessive thirst, excessive urination, and heat intolerance.  Physical Exam  General:  overwt but well app Head:  normocephalic, atraumatic, and no abnormalities observed.   Eyes:  vision grossly intact, pupils equal, pupils round, and pupils reactive to light.  no conjunctival pallor, injection or icterus  Mouth:  pharynx pink and moist.   Neck:  supple with full rom and no masses or thyromegally, no JVD or carotid bruit  Lungs:  Normal respiratory effort, chest expands symmetrically. Lungs are clear to auscultation, no crackles or wheezes. Heart:  Normal rate and regular rhythm. S1 and S2 normal without gallop, murmur, click, rub or other extra sounds. Abdomen:  soft and non-tender.  no renal bruits  Msk:  No deformity or scoliosis noted of thoracic or lumbar spine.   Pulses:  R and L carotid,radial,femoral,dorsalis pedis and posterior tibial pulses are full and equal bilaterally Extremities:  No clubbing, cyanosis, edema, or deformity noted with normal full range of motion of all joints.   Neurologic:  sensation intact to light touch, gait  normal, and DTRs symmetrical and normal.   Skin:  Intact without suspicious lesions or rashes Cervical Nodes:  No lymphadenopathy noted Psych:  normal affect, talkative and pleasant   Diabetes Management Exam:    Foot Exam (with socks and/or shoes not present):       Sensory-Pinprick/Light touch:          Left medial foot (L-4): normal          Left dorsal foot (L-5): normal          Left lateral foot (S-1): normal          Right medial foot (L-4): normal          Right dorsal foot  (L-5): normal          Right lateral foot (S-1): normal       Sensory-Monofilament:          Left foot: normal          Right foot: normal       Inspection:          Left foot: normal          Right foot: normal       Nails:          Left foot: normal          Right foot: normal   Impression & Recommendations:  Problem # 1:  HYPERCHOLESTEROLEMIA, PURE (ICD-272.0) Assessment Unchanged  has been well controlled with statin and diet rev low sat fat diet  urged to continue exercise  Her updated medication list for this problem includes:    Simvastatin 40 Mg Tabs (Simvastatin) .Marland Kitchen... Take one by mouth daily  Orders: Venipuncture ([address]) TLB-Lipid Panel (80061-LIPID) TLB-Renal Function Panel (80069-RENAL) TLB-ALT (SGPT) (84460-ALT) TLB-AST (SGOT) (84450-SGOT) TLB-A1C / Hgb A1C (Glycohemoglobin) (83036-A1C)  Labs Reviewed: SGOT: 27 (11/04/2008)   SGPT: 31 (11/04/2008)   HDL:45.30 (11/04/2008), 48.5 (10/07/2007)  LDL:44 (11/04/2008), 54 (10/07/2007)  Chol:123 (11/04/2008), 123 (10/07/2007)  Trig:169.0 (11/04/2008), 101 (10/07/2007)  Problem # 2:  HYPERTENSION (ICD-401.9) Assessment: Unchanged  bp continues to be well controlled lab and f/u 3 mo  lab today Her updated medication list for this problem includes:    Lasix 40 Mg Tabs (Furosemide) .Marland Kitchen... Take one half by mouth daily    Benazepril Hcl 20 Mg Tabs (Benazepril hcl) .Marland Kitchen... 1 by mouth once daily  Orders: Venipuncture ([address]) TLB-Lipid Panel (80061-LIPID) TLB-Renal Function Panel (80069-RENAL) TLB-ALT (SGPT) (84460-ALT) TLB-AST (SGOT) (84450-SGOT) TLB-A1C / Hgb A1C (Glycohemoglobin) (83036-A1C)  BP today: 128/76 Prior BP: 120/68 (11/04/2008)  Labs Reviewed: K+: 4.9 (11/04/2008) Creat: : 0.8 (11/04/2008)   Chol: 123 (11/04/2008)   HDL: 45.30 (11/04/2008)   LDL: 44 (11/04/2008)   TG: 169.0 (11/04/2008)  Problem # 3:  DIABETES MELLITUS, TYPE II (ICD-250.00) Assessment: Deteriorated overall - sugars are up a bit --  checkAIC and update asked pt to check 2 h pp sugars on some days and am on other days -- update at next visit disc healthy diet (low simple sugar/ choose complex carbs/ low sat fat) diet and exercise in detail  must stop sweets keep up exercise opthy is up to date wt loss needed  flu shot today Her updated medication list for this problem includes:    Metformin Hcl 500 Mg Tabs (Metformin hcl) .Marland Kitchen... 1 by mouth two times a day    Benazepril Hcl 20 Mg Tabs (Benazepril hcl) .Marland Kitchen... 1 by mouth once daily  Orders: Venipuncture ([address]) TLB-Lipid Panel (80061-LIPID) TLB-Renal Function Panel (  80069-RENAL) TLB-ALT (SGPT) (84460-ALT) TLB-AST (SGOT) (84450-SGOT) TLB-A1C / Hgb A1C (Glycohemoglobin) (83036-A1C)  Problem # 4:  Preventive Health Care (ICD-V70.0) Assessment: Comment Only pt cannot have Td due to rxn in past  Problem # 5:  Hx of ROSACEA (ICD-695.3) Assessment: Unchanged refil of metrogel- which has been helpful to her reminded to use her sunscreen  Complete Medication List: 1)  Prilosec 20 Mg Cpdr (Omeprazole) .... Take one by mouth daily 2)  Lasix 40 Mg Tabs (Furosemide) .... Take one half by mouth daily 3)  Simvastatin 40 Mg Tabs (Simvastatin) .... Take one by mouth daily 4)  Fish Oil Caps (Omega-3 fatty acids caps) .... Take by mouth as directed 5)  Calcium-vitamin D Tabs (Calcium-vitamin d tabs) .... Take by mouth as directed 6)  Metformin Hcl 500 Mg Tabs (Metformin hcl) .Marland Kitchen.. 1 by mouth two times a day 7)  Multivitamins Tabs (Multiple vitamin) .... Take one by mouth daily 8)  Vitamin C 500 Mg Tabs (Ascorbic acid) .... Take one by mouth daily 9)  Vitamin E 400 Unit Caps (Vitamin e) .... Take one by mouth daily 10)  Benazepril Hcl 20 Mg Tabs (Benazepril hcl) .Marland Kitchen.. 1 by mouth once daily 11)  Metrogel 1 % Gel (Metronidazole) .... Apply to affected area on face once daily for rosacea  Other Orders: Flu Vaccine 46yrs + ([address]) Admin 1st Vaccine ([address]) Admin 1st Vaccine [facility])  [id number])  Patient Instructions: 1)  flu shot today 2)  keep working on low sugar diet and weight loss 3)  keep up the good exercise  4)  schedule fasting labs and then follow up (30 min check up ) in 6 months  5)  lipid/ast/alt/AIC Judeth Porch Alyson Reedy 250.0, 272  Prescriptions: METROGEL 1 % GEL (METRONIDAZOLE) apply to affected area on face once daily for rosacea  #90 days x 3   Entered and Authorized by:   Judith Part MD   Signed by:   Judith Part MD on 05/10/2009   Method used:   Print then Give to Patient   RxID:   [id number]   Current Allergies (reviewed today): ! * TD SHOT * ANTIHISTAMINES VIOXX   Influenza Vaccine    Vaccine Type: Fluvax 3+    Site: left deltoid    Mfr: GlaxoSmithKline    Dose: 0.5 ml    Route: IM    Given by: Lewanda Rife LPN    Exp. Date: 09/23/2009    Lot #: [id number]    VIS given: 10/18/06 version given May 10, 2009.  Flu Vaccine Consent Questions    Do you have a history of severe allergic reactions to this vaccine? no    Any prior history of allergic reactions to egg and/or gelatin? no    Do you have a sensitivity to the preservative Thimersol? no    Do you have a past history of Guillan-Barre Syndrome? no    Do you currently have an acute febrile illness? no    Have you ever had a severe reaction to latex? no    Vaccine information given and explained to patient? yes    Are you currently pregnant? no    Preventive Care Screening  Last Pneumovax:    Date:  03/28/2003    Results:  given      had reaction to TD

## 2010-04-26 NOTE — Progress Notes (Signed)
Summary: Retaining fluid  Phone Note Call from Patient Call back at Home Phone [phone]   Caller: Patient Call For: Judith Part MD Summary of Call: Patient says that she feels tired all of the time and if she walks across the floor she is out of breath.  This had been going on for about two weeks now.  Wants to know if she can increase her Lasix from 20mg  daily to 40mg  daily until she comes in on Friday to see Dr. Milinda Antis.  She is retaining a lot fluid in her ankles, hands, and fingers.  Feeling a lot of pressure in her chest as well. Initial call taken by: Linde Gillis CMA Duncan Dull),  July 21, 2009 11:04 AM  Follow-up for Phone Call        go ahead and increase lasix until appt- but if symptoms worsen update me or go to ER if after hours  Follow-up by: Judith Part MD,  July 21, 2009 12:21 PM  Additional Follow-up for Phone Call Additional follow up Details #1::        Left message for patient to call back. Lewanda Rife LPN  July 21, 2009 12:51 PM   Patient notified as instructed via telephone. Additional Follow-up by: Linde Gillis CMA Duncan Dull),  July 21, 2009 3:15 PM

## 2010-04-26 NOTE — Miscellaneous (Signed)
Summary: [facility]  Clinical Lists Changes  Medications: Added new medication of MIRALAX   POWD (POLYETHYLENE GLYCOL 3350) As per prep  instructions. - Signed Added new medication of DULCOLAX 5 MG  TBEC (BISACODYL) Day before procedure take 2 at 3pm and 2 at 8pm. - Signed Added new medication of REGLAN 10 MG  TABS (METOCLOPRAMIDE HCL) As per prep instructions. - Signed Rx of MIRALAX   POWD (POLYETHYLENE GLYCOL 3350) As per prep  instructions.;  #255gm x 0;  Signed;  Entered by: Ezra Sites RN;  Authorized by: Hart Carwin MD;  Method used: Electronically to [facility] [address], [place], [place], [place]  [address], Ph: [phone], Fax: [phone] Rx of DULCOLAX 5 MG  TBEC (BISACODYL) Day before procedure take 2 at 3pm and 2 at 8pm.;  #4 x 0;  Signed;  Entered by: Ezra Sites RN;  Authorized by: Hart Carwin MD;  Method used: Electronically to [facility]  [phone], [place], [place], [place]  [address], Ph: [phone], Fax: [phone] Rx of REGLAN 10 MG  TABS (METOCLOPRAMIDE HCL) As per prep instructions.;  #2 x 0;  Signed;  Entered by: Ezra Sites RN;  Authorized by: Hart Carwin MD;  Method used: Electronically to [facility] [address], [place], [place], [place]  [address], Ph: [phone], Fax: [phone] Observations: Added new observation of ALLERGY REV: Done (05/12/2009 14:33)    Prescriptions: REGLAN 10 MG  TABS (METOCLOPRAMIDE HCL) As per prep instructions.  #2 x 0   Entered by:   Ezra Sites RN   Authorized by:   Hart Carwin MD   Signed by:   Ezra Sites RN on 05/12/2009   Method used:   Electronically to        [facility] [address] (retail)       [address], [address]       [place], [place]  [address]       Ph: [phone]       Fax: [phone]   RxID:   [id number] DULCOLAX 5 MG  TBEC (BISACODYL) Day before procedure take  2 at 3pm and 2 at 8pm.  #4 x 0   Entered by:   Ezra Sites RN   Authorized by:   Hart Carwin MD   Signed by:   Ezra Sites RN on 05/12/2009   Method used:   Electronically to        [facility] [address] (retail)       [address], [address]       [place], [place]  [address]       Ph: [phone]       Fax: [phone]   RxID:   [id number] MIRALAX   POWD (POLYETHYLENE GLYCOL 3350) As per prep  instructions.  #255gm x 0   Entered by:   Ezra Sites RN   Authorized by:   Hart Carwin MD   Signed by:   Ezra Sites RN on 05/12/2009   Method used:   Electronically to        [facility] [address] (retail)       [address]       [place], [place]  [address]       Ph: [phone]       Fax: [phone]   RxID:   [id number]

## 2010-04-26 NOTE — Procedures (Signed)
Summary: Colonoscopy  Patient: Gabriella Howe Note: All result statuses are Final unless otherwise noted.  Tests: (1) Colonoscopy (COL)   COL Colonoscopy           DONE     [facility]     [address]     [place], [place]  [address]           COLONOSCOPY PROCEDURE REPORT           PATIENT:  [name], [name]  MR#:  [id number]     BIRTHDATE:  May 15, 1934, 74 yrs. old  GENDER:  female           ENDOSCOPIST:  Hedwig Morton. Juanda Chance, MD     Referred by:           PROCEDURE DATE:  05/26/2009     PROCEDURE:  Colonoscopy [address]     ASA CLASS:  Class II     INDICATIONS:  colorectal cancer screening, average risk     adenomatous polyp 2003           MEDICATIONS:   Versed 7 mg, Fentanyl 50 mcg           DESCRIPTION OF PROCEDURE:   After the risks benefits and     alternatives of the procedure were thoroughly explained, informed     consent was obtained.  Digital rectal exam was performed and     revealed no rectal masses.   The LB [id number]-[id number] endoscope     was introduced through the anus and advanced to the cecum, which     was identified by both the appendix and ileocecal valve, without     limitations.  The quality of the prep was good, using MiraLax.     The instrument was then slowly withdrawn as the colon was fully     examined.     <<PROCEDUREIMAGES>>           FINDINGS:  There were multiple polyps identified and removed. in     the ascending colon. 4 diminutive polyps removed from right colon     The polyps were removed using cold biopsy forceps (see image2,     image1, and image6).  This was otherwise a normal examination of     the colon (see image7, image5, and image4).   Retroflexed views in     the rectum revealed no abnormalities.    The scope was then     withdrawn from the patient and the procedure completed.           COMPLICATIONS:  None           ENDOSCOPIC IMPRESSION:     1) Polyps, multiple in the ascending colon     2) Otherwise normal examination  RECOMMENDATIONS:     1) Await pathology results           REPEAT EXAM:  In 7 year(s) for.           ______________________________     Hedwig Morton. Juanda Chance, MD           CC:           n.     eSIGNED:   Hedwig Morton. Aubreigh Fuerte at 05/26/2009 09:43 AM           Everlean Alstrom, [id number]  Note: An exclamation mark (!) indicates a result that was not dispersed into the flowsheet. Document Creation Date: 05/26/2009 9:44 AM _______________________________________________________________________  (1) Order result status: Final Collection or  observation date-time: 05/26/2009 09:36 Requested date-time:  Receipt date-time:  Reported date-time:  Referring Physician:   Ordering Physician: Lina Sar [phone]) Specimen Source:  Source: Launa Grill Order Number: [id number] Lab site:   Appended Document: Colonoscopy recall in 5 yrs     Procedures Next Due Date:    Colonoscopy: 04/2014

## 2010-06-20 LAB — GLUCOSE, CAPILLARY: Glucose-Capillary: 134 mg/dL — ABNORMAL HIGH (ref 70–99)

## 2010-07-23 ENCOUNTER — Encounter: Payer: Self-pay | Admitting: Family Medicine

## 2010-08-02 ENCOUNTER — Telehealth: Payer: Self-pay | Admitting: Family Medicine

## 2010-08-02 DIAGNOSIS — E119 Type 2 diabetes mellitus without complications: Secondary | ICD-10-CM

## 2010-08-02 DIAGNOSIS — K219 Gastro-esophageal reflux disease without esophagitis: Secondary | ICD-10-CM

## 2010-08-02 DIAGNOSIS — E7800 Pure hypercholesterolemia, unspecified: Secondary | ICD-10-CM

## 2010-08-02 DIAGNOSIS — I10 Essential (primary) hypertension: Secondary | ICD-10-CM

## 2010-08-02 NOTE — Telephone Encounter (Signed)
Message copied by Roxy Manns on Tue Aug 02, 2010  9:16 PM ------      Message from: Liane Comber      Created: Tue Aug 02, 2010 11:40 AM      Regarding: Cpx labs Fri       Please order  future cpx labs for pt's upcomming lab appt.      Thanks      Rodney Booze

## 2010-08-05 ENCOUNTER — Other Ambulatory Visit (INDEPENDENT_AMBULATORY_CARE_PROVIDER_SITE_OTHER): Payer: Medicare Other | Admitting: Family Medicine

## 2010-08-05 DIAGNOSIS — E7800 Pure hypercholesterolemia, unspecified: Secondary | ICD-10-CM

## 2010-08-05 DIAGNOSIS — K219 Gastro-esophageal reflux disease without esophagitis: Secondary | ICD-10-CM

## 2010-08-05 DIAGNOSIS — E119 Type 2 diabetes mellitus without complications: Secondary | ICD-10-CM

## 2010-08-05 DIAGNOSIS — I10 Essential (primary) hypertension: Secondary | ICD-10-CM

## 2010-08-05 LAB — COMPREHENSIVE METABOLIC PANEL
AST: 23 U/L (ref 0–37)
Albumin: 3.9 g/dL (ref 3.5–5.2)
Alkaline Phosphatase: 63 U/L (ref 39–117)
BUN: 14 mg/dL (ref 6–23)
Calcium: 9.4 mg/dL (ref 8.4–10.5)
Chloride: 108 mEq/L (ref 96–112)
Creatinine, Ser: 0.8 mg/dL (ref 0.4–1.2)
Glucose, Bld: 148 mg/dL — ABNORMAL HIGH (ref 70–99)
Potassium: 4.5 mEq/L (ref 3.5–5.1)

## 2010-08-05 LAB — CBC WITH DIFFERENTIAL/PLATELET
Basophils Relative: 0.4 % (ref 0.0–3.0)
Eosinophils Absolute: 0.2 10*3/uL (ref 0.0–0.7)
Eosinophils Relative: 3.7 % (ref 0.0–5.0)
Hemoglobin: 14.3 g/dL (ref 12.0–15.0)
MCHC: 34.6 g/dL (ref 30.0–36.0)
MCV: 93.5 fl (ref 78.0–100.0)
Monocytes Absolute: 0.4 10*3/uL (ref 0.1–1.0)
Neutro Abs: 3.8 10*3/uL (ref 1.4–7.7)
RBC: 4.4 Mil/uL (ref 3.87–5.11)
WBC: 6.2 10*3/uL (ref 4.5–10.5)

## 2010-08-05 LAB — LIPID PANEL
Cholesterol: 117 mg/dL (ref 0–200)
LDL Cholesterol: 48 mg/dL (ref 0–99)
Triglycerides: 132 mg/dL (ref 0.0–149.0)

## 2010-08-05 LAB — TSH: TSH: 1.14 u[IU]/mL (ref 0.35–5.50)

## 2010-08-10 ENCOUNTER — Ambulatory Visit (INDEPENDENT_AMBULATORY_CARE_PROVIDER_SITE_OTHER): Payer: Medicare Other | Admitting: Family Medicine

## 2010-08-10 ENCOUNTER — Encounter: Payer: Self-pay | Admitting: Family Medicine

## 2010-08-10 DIAGNOSIS — I10 Essential (primary) hypertension: Secondary | ICD-10-CM

## 2010-08-10 DIAGNOSIS — Z860100 Personal history of colon polyps, unspecified: Secondary | ICD-10-CM

## 2010-08-10 DIAGNOSIS — E7800 Pure hypercholesterolemia, unspecified: Secondary | ICD-10-CM

## 2010-08-10 DIAGNOSIS — Z1231 Encounter for screening mammogram for malignant neoplasm of breast: Secondary | ICD-10-CM

## 2010-08-10 DIAGNOSIS — E119 Type 2 diabetes mellitus without complications: Secondary | ICD-10-CM

## 2010-08-10 MED ORDER — OMEPRAZOLE 20 MG PO CPDR: 20.0000 mg | DELAYED_RELEASE_CAPSULE | Freq: Every day | ORAL | Status: DC

## 2010-08-10 MED ORDER — METFORMIN HCL 1000 MG PO TABS: 1000.0000 mg | ORAL_TABLET | Freq: Two times a day (BID) | ORAL | Status: DC

## 2010-08-10 MED ORDER — METRONIDAZOLE 0.75 % EX GEL: Freq: Every day | CUTANEOUS | Status: DC

## 2010-08-10 MED ORDER — BENAZEPRIL HCL 20 MG PO TABS: 20.0000 mg | ORAL_TABLET | Freq: Every day | ORAL | Status: DC

## 2010-08-10 MED ORDER — SIMVASTATIN 40 MG PO TABS: 40.0000 mg | ORAL_TABLET | Freq: Every day | ORAL | Status: DC

## 2010-08-10 MED ORDER — FUROSEMIDE 40 MG PO TABS: 20.0000 mg | ORAL_TABLET | Freq: Every day | ORAL | Status: DC

## 2010-08-10 NOTE — Assessment & Plan Note (Signed)
Fair control Expect imp with more exercise Refilled meds

## 2010-08-10 NOTE — Patient Instructions (Addendum)
If you are interested in shingles vaccine in future - call your insurance company to see how coverage is and call us to schedule  We will schedule mammogram at check out  We will do diabetes education referral at check out  Schedule follow up in 3 months with labs prior  If you are interested in shingles vaccine in future - call your insurance company to see how coverage is and call us to schedule  Increase metformin to 1000 mg twice daily

## 2010-08-10 NOTE — Progress Notes (Signed)
Subjective:    Patient ID: Gabriella Howe, female    DOB: Oct 25, 1934, 76 y.o.   MRN: [id number]  HPI Here for check up of chronic medical problems and to review health mt list  Has been feeling pretty good  Nothing new   Her eyes are driving her nuts with cataracts - about ready to get them off    Cannot take tetnus shot   Zoster status- has had shingles 3 times - long time ago  Forgot flu shot  This year  Pneumovax 05   No pap - since she had hysterectomy No symtpoms or problems   Mam 04-- does not get them  Self exam - no lumps or problems   opthy in 9/11    dexa 01  - in [place] and it was fine -- and does not want any more  Is taking ca and vit D Some exercise - walks on treadmill and some exercises  [facility] to [facility] [employer]    DM is worse with a1c up from 7.3 to 8.1 This is bad  Thinks it is from her diet -- is eating junk and bread -- now getting off of it  Ate for boredom  Knows what she is supposed to do  Does not want to go on insulin Is interested in DM teaching  150-160s in ams and 200 in eve pp   Wt is up 3 lb with bmi of 42   Lipids very well controlled  Lab Results  Component Value Date   CHOL 117 08/05/2010   CHOL 122 05/10/2009   CHOL 123 11/04/2008   Lab Results  Component Value Date   HDL 42.90 08/05/2010   HDL 82.95 05/10/2009   HDL 62.13 11/04/2008   Lab Results  Component Value Date   LDLCALC 48 08/05/2010   LDLCALC 44 05/10/2009   LDLCALC 44 11/04/2008   Lab Results  Component Value Date   TRIG 132.0 08/05/2010   TRIG 113.0 05/10/2009   TRIG 169.0* 11/04/2008   Lab Results  Component Value Date   CHOLHDL 3 08/05/2010   CHOLHDL 2 05/10/2009   CHOLHDL 3 11/04/2008   No results found for this basename: LDLDIRECT    HTN in good control 134/76 No recent cp or sob or edema   Past Medical History  Diagnosis Date  . CHF (congestive heart failure)     diastolic  . Heart disease, unspecified   . Hypertension   . Hyperlipidemia     . Chest pain, unspecified   . Shortness of breath   . Fluid overload   . GERD (gastroesophageal reflux disease)   . Obesity, unspecified   . Diabetes mellitus     type II  . Unspecified asthma   . Benign neoplasm of colon   . Unspecified sleep apnea   . Carpal tunnel syndrome   . Rosacea   . Degeneration of intervertebral disc, site unspecified   . Personal history of colonic polyps   . Encounter for long-term (current) use of other medications     History   Social History  . Marital Status: Widowed    Spouse Name: N/A    Number of Children: N/A  . Years of Education: N/A   Occupational History  . Not on file.   Social History Main Topics  . Smoking status: Never Smoker   . Smokeless tobacco: Not on file  . Alcohol Use: No  . Drug Use:   . Sexually Active:  Other Topics Concern  . Not on file   Social History Narrative  . No narrative on file   No family history on file. . Allergies  Allergen Reactions  . Rofecoxib     REACTION: reaction not known  . Tetanus-Diphtheria Toxoids     REACTION: sick with fever         Review of Systems Review of Systems  Constitutional: Negative for fever, appetite change,  and unexpected weight change. pos for fatigue  Eyes: Negative for pain and pos for blurred vision from cataract  Respiratory: Negative for cough and shortness of breath.   Cardiovascular: Negative.  for cp or sob  Gastrointestinal: Negative for nausea, diarrhea and constipation. or blood in stool  Genitourinary: Negative for urgency and frequency.  Skin: Negative for pallor.  Neurological: Negative for weakness, light-headedness, numbness and headaches.  Hematological: Negative for adenopathy. Does not bruise/bleed easily.  Psychiatric/Behavioral: Negative for dysphoric mood. The patient is not nervous/anxious.          Objective:   Physical Exam  Constitutional: She appears well-developed and well-nourished. No distress.       Obese and well  appearing   HENT:  Head: Normocephalic and atraumatic.  Right Ear: External ear normal.  Left Ear: External ear normal.  Nose: Nose normal.  Mouth/Throat: Oropharynx is clear and moist.  Eyes: Conjunctivae and EOM are normal. Pupils are equal, round, and reactive to light.  Neck: Normal range of motion. Neck supple. No JVD present. Carotid bruit is not present. No thyromegaly present.  Cardiovascular: Normal rate, regular rhythm and normal heart sounds.   Pulmonary/Chest: Effort normal and breath sounds normal. No respiratory distress. She has no wheezes.  Abdominal: Soft. Bowel sounds are normal. She exhibits no distension, no abdominal bruit and no mass. There is no tenderness.  Genitourinary: No breast swelling, tenderness, discharge or bleeding.  Musculoskeletal: Normal range of motion. She exhibits no edema and no tenderness.  Lymphadenopathy:    She has no cervical adenopathy.  Neurological: She is alert. She displays normal reflexes. No cranial nerve deficit. She exhibits normal muscle tone. Coordination normal.  Skin: Skin is warm and dry. No rash noted. No erythema. No pallor.  Psychiatric: She has a normal mood and affect.          Assessment & Plan:

## 2010-08-10 NOTE — Assessment & Plan Note (Signed)
Mam sched Breast exam done-nl  Enc self exams

## 2010-08-10 NOTE — Assessment & Plan Note (Signed)
Good control with statin Rev labs Rev low satfat diet

## 2010-08-10 NOTE — Assessment & Plan Note (Signed)
Out of control Inc metformin to 1000 bid Ref to DM teaching  Rev low glycemic diet Wt loss disc a1c and f/u 3 mo  opthy up to date

## 2010-08-12 NOTE — Assessment & Plan Note (Signed)
[facility]                              CARDIOLOGY OFFICE NOTE   Gabriella, Howe                      MRN:          [id number]  DATE:10/12/2005                            DOB:          1935/03/18    PRIMARY:  Marne A. Tower, MD.   REASON FOR REFERRAL:  Evaluate patient with chest pressure and shortness of  breath.   HISTORY OF PRESENT ILLNESS:  The patient is a pleasant 76 year old white  female with a history of heart failure apparently with a preserved ejection  fraction.  She was hospitalized in 2000 in [place].  She reports a negative  stress test and echocardiogram at that time and has not seen a cardiologist  since.  She did relatively well, though she is very sedentary just because  of lack of motivation.  She still works in office work.  With activities  over the last 3-4 weeks she has noticed increasing dyspnea.  She has had a  cough that has been nonproductive.  She has noticed her ankles swelling.  She felt like she could not take a deep breath.  She has had chest pressure,  some of it reproducible with exertion and some of it there sporadically  through the day.  She is not describing any jaw or arm discomfort.  The  pressure seems to be mild or moderate.  She had this back in 2000 as well.  It goes away on its own.  She did see Dr. Milinda Antis and had Lodine stopped and a  dose of diuretic increased.  She did have blood work to include a BNP which  was normal at 21.   PAST MEDICAL HISTORY:  1.  Sleep apnea.  2.  Hypertension since 1998.  3.  Diabetes mellitus, diet controlled.  4.  Apparent reactive airway disease, per the patient.  5.  Rosacea.  6.  Lumbar spine degenerative disease.  7.  Colonic polyps.  8.  Gastroesophageal reflux disease.   PAST SURGICAL HISTORY:  1.  Partial hysterectomy.  2.  Umbilical hernia repair.   ALLERGIES:  1.  SULFA.  2.  VIOXX.  3.  ANTIHISTAMINES.   MEDICATIONS:  1.  Prilosec 20 mg a  day.  2.  Lasix 40 daily.  3.  Benicar 20 mg daily.  4.  Lipitor 10 mg daily.  5.  Multivitamin.  6.  Fish oil.  7.  Vitamin B6 and B12.  8.  Folic acid.  9.  Vitamin C.  10. Calcium.   SOCIAL HISTORY:  The patient works in office work.  She is a widow.  She has  1 daughter, 3 grandchildren and 3 great-grandchildren (one was born  yesterday).   FAMILY HISTORY:  Noncontributory for early coronary artery disease.  All of  her relatives have diabetes and hypertension.  Her father was killed at 36,  hit by a train.   REVIEW OF SYSTEMS:  Are stated in the HPI and positive for rhinitis and back  pain.  Negative for other systems.   PHYSICAL EXAMINATION:  Patient is in no distress.  Blood pressure 124/79,  heart rate 90 and regular, weight 226 pounds.  HEENT:  Eyes unremarkable, pupils equal, round, react to light, fundi not  well visualized secondary to lens opacifications, probable cataracts, oral  mucosa unremarkable.  NECK:  No jugular venous distention, wave form within normal limits, carotid  upstroke brisk and symmetric, no bruits, no thyromegaly.  LYMPHATICS:  No cervical, axillary or inguinal adenopathy.  LUNGS:  Clear to auscultation bilaterally.  BACK:  No costovertebral angle tenderness.  CHEST:  Unremarkable.  HEART:  PMI not displaced or sustained, S1 and S2 within normal limits, no  S3, no S4, no murmurs.  ABDOMEN:  Obese, positive bowel sounds, normal in frequency and pitch, no  bruits, no rebound, no guarding, no midline pulses or masses, no  hepatomegaly, splenomegaly.  SKIN:  No rashes, __________.  EXTREMITIES:  Pulses 2+, trace bilateral lower extremity edema.  NEURO:  Oriented to person, place and time, cranial nerves II-XII grossly  intact, motor grossly intact.   EKG:  Sinus rhythm, rate 86, incomplete __________ V1 and V2, no acute ST-T  wave changes.   ASSESSMENT AND PLAN:  1.  Dyspnea.  The patient's dyspnea might be cardiac and there is a       possibility of ischemia.  She would not be able to walk on a treadmill      because of this.  Therefore, she will have adenosine Cardiolite.  The      BNP would argue against diastolic or systolic heart failure, though it      can be low in patients with obesity.  Will get an echocardiogram to look      for both of these possibilities.  If her echo and Cardiolite are      unremarkable then I would suggest referral to a pulmonologist.      Certainly, some of her complaints are related to a sedentary      lifestyle and obesity.  She will remain on the medications as listed.  2.  Followup will be based on the results of the above.                               Rollene Rotunda, MD, [facility]    JH/MedQ  DD:  10/12/2005  DT:  10/12/2005  Job #:  [id number]   cc:   Marne A. Milinda Antis, MD

## 2010-08-23 ENCOUNTER — Telehealth: Payer: Self-pay | Admitting: *Deleted

## 2010-08-23 MED ORDER — FUROSEMIDE 40 MG PO TABS: 20.0000 mg | ORAL_TABLET | Freq: Every day | ORAL | Status: DC

## 2010-08-23 NOTE — Telephone Encounter (Signed)
Completed form and rx faxed to [phone] as instructed.

## 2010-08-23 NOTE — Telephone Encounter (Signed)
Pharmacy is asking for clarification on furosemide script from 5/16.  Form is on your shelf.

## 2010-08-23 NOTE — Telephone Encounter (Signed)
That is right- I had 90 instead  Of 45 I changed and printed it -thanks

## 2010-08-31 ENCOUNTER — Ambulatory Visit: Payer: Self-pay | Admitting: Family Medicine

## 2010-09-02 ENCOUNTER — Ambulatory Visit: Payer: Self-pay | Admitting: Family Medicine

## 2010-09-02 LAB — HM MAMMOGRAPHY: HM Mammogram: NEGATIVE

## 2010-09-10 ENCOUNTER — Telehealth: Payer: Self-pay

## 2010-09-10 NOTE — Telephone Encounter (Signed)
Quick abstraction for add'l views of mammogram. Sent to be scanned. Patient notified as instructed by telephone.

## 2010-09-30 ENCOUNTER — Telehealth: Payer: Self-pay | Admitting: *Deleted

## 2010-09-30 NOTE — Telephone Encounter (Signed)
Form faxed

## 2010-09-30 NOTE — Telephone Encounter (Signed)
Form for diabetic supplies is on your shelf.  I checked with the patient and she does want these supplies.

## 2010-09-30 NOTE — Telephone Encounter (Signed)
Form done and in IN box 

## 2010-11-03 LAB — HM DIABETES EYE EXAM

## 2010-11-07 ENCOUNTER — Other Ambulatory Visit (INDEPENDENT_AMBULATORY_CARE_PROVIDER_SITE_OTHER): Payer: Medicare Other | Admitting: Family Medicine

## 2010-11-07 DIAGNOSIS — E119 Type 2 diabetes mellitus without complications: Secondary | ICD-10-CM

## 2010-11-09 ENCOUNTER — Other Ambulatory Visit: Payer: Medicare Other

## 2010-11-14 ENCOUNTER — Ambulatory Visit (INDEPENDENT_AMBULATORY_CARE_PROVIDER_SITE_OTHER): Payer: Medicare Other | Admitting: Family Medicine

## 2010-11-14 ENCOUNTER — Encounter: Payer: Self-pay | Admitting: Family Medicine

## 2010-11-14 DIAGNOSIS — I10 Essential (primary) hypertension: Secondary | ICD-10-CM

## 2010-11-14 DIAGNOSIS — E7800 Pure hypercholesterolemia, unspecified: Secondary | ICD-10-CM

## 2010-11-14 DIAGNOSIS — E119 Type 2 diabetes mellitus without complications: Secondary | ICD-10-CM

## 2010-11-14 NOTE — Assessment & Plan Note (Signed)
bp better on 2nd check Pt is a bit nervous Hope with exercise will come down further Re check 6 mo  If not at goal consider dose adj

## 2010-11-14 NOTE — Assessment & Plan Note (Signed)
Much improved  Tolerating metformin / better diet and also wt loss  opthy up to date  No change in meds Rev low glycemic diet  F/u 6 mo after labs

## 2010-11-14 NOTE — Patient Instructions (Signed)
Sugar control is much better-good job Keep up the good diet and [facility] and [facility] for [facility] is 5 days per week  Blood pressure was better on 2nd check  Lab and then follow up in 6 months

## 2010-11-14 NOTE — Progress Notes (Signed)
Subjective:    Patient ID: Gabriella Howe, female    DOB: 03-27-1935, 76 y.o.   MRN: [id number]  HPI Here for f/u of DM and HTN   Last visit inc metformin to 1000 bid for high a1c of 8.1 No side effects  Is eating a lot better - no more sweets or junk food or sodas or processed dinners  Feels a lot better  Back even feels better  Since then lost 13 lb Also a1c down to 6.7!-- great!! opthy was on aug 9th- no retinopathy  13 lb wt loss By her scale -- about the same   Some exercise- -not enough  [facility] - will [facility] to [facility]  Has a treadmill at home     Lipids good with statin in may- is at goal  bp high 142/76 first check today on ace  No cp or palpitations or edema or headache Feels fine  Patient Active Problem List  Diagnoses  . DIABETES MELLITUS, TYPE II  . HYPERCHOLESTEROLEMIA, PURE  . FLUID RETENTION  . OBESITY  . CARPAL TUNNEL SYNDROME  . HYPERTENSION  . DIASTOLIC HEART FAILURE, CHRONIC  . DIASTOLIC DYSFUNCTION  . REACTIVE AIRWAY DISEASE  . GERD  . ROSACEA  . DEGENERATIVE DISC DISEASE  . SLEEP APNEA  . DYSPNEA  . COLONIC POLYPS, HX OF  . Other screening mammogram   Past Medical History  Diagnosis Date  . CHF (congestive heart failure)     diastolic  . Heart disease, unspecified   . Hypertension   . Hyperlipidemia   . Chest pain, unspecified   . Shortness of breath   . Fluid overload   . GERD (gastroesophageal reflux disease)   . Obesity, unspecified   . Diabetes mellitus     type II  . Unspecified asthma   . Benign neoplasm of colon   . Unspecified sleep apnea   . Carpal tunnel syndrome   . Rosacea   . Degeneration of intervertebral disc, site unspecified   . Personal history of colonic polyps   . Encounter for long-term (current) use of other medications    Past Surgical History  Procedure Date  . Abdominal hysterectomy     partial, fibroids  . Esophagogastroduodenoscopy 2000  . Umbilical hernia repair  01/2004   History  Substance Use Topics  . Smoking status: Never Smoker   . Smokeless tobacco: Not on file  . Alcohol Use: No   No family history on file. Allergies  Allergen Reactions  . Rofecoxib     REACTION: reaction not known  . Tetanus-Diphtheria Toxoids     REACTION: sick with fever   Current Outpatient Prescriptions on File Prior to Visit  Medication Sig Dispense Refill  . Ascorbic Acid (VITAMIN C) 500 MG tablet Take 500 mg by mouth daily.        . benazepril (LOTENSIN) 20 MG tablet Take 1 tablet (20 mg total) by mouth daily.  90 tablet  3  . CALCIUM-VITAMIN D PO Take 1 tablet by mouth 2 (two) times daily.       . furosemide (LASIX) 40 MG tablet Take 0.5 tablets (20 mg total) by mouth daily.  45 tablet  3  . metFORMIN (GLUCOPHAGE) 1000 MG tablet Take 1 tablet (1,000 mg total) by mouth 2 (two) times daily with a meal.  180 tablet  3  . metroNIDAZOLE (METROGEL) 0.75 % gel Apply topically daily. To affected area on face.Rosacea.  45 g  3  . Multiple Vitamin (MULTIVITAMIN) capsule Take 1 capsule by mouth daily.        . Omega-3 Fatty Acids (FISH OIL PO) Take 1 capsule by mouth daily.       Marland Kitchen omeprazole (PRILOSEC) 20 MG capsule Take 1 capsule (20 mg total) by mouth daily.  90 capsule  3  . Pseudoephedrine HCl (SINUS & ALLERGY 12 HOUR PO) Take by mouth as directed.        . simvastatin (ZOCOR) 40 MG tablet Take 1 tablet (40 mg total) by mouth at bedtime.  90 tablet  3  . vitamin E 400 UNIT capsule Take 400 Units by mouth daily.            Review of Systems Review of Systems  Constitutional: Negative for fever, appetite change, fatigue and unexpected weight change.  Eyes: Negative for pain and visual disturbance.  Respiratory: Negative for cough and shortness of breath.   Cardiovascular: Negative. For cp or palpitations  Gastrointestinal: Negative for nausea, diarrhea and constipation.  Genitourinary: Negative for urgency and frequency.  Skin: Negative for pallor. or rash    Neurological: Negative for weakness, light-headedness, numbness and headaches.  Hematological: Negative for adenopathy. Does not bruise/bleed easily.  Psychiatric/Behavioral: Negative for dysphoric mood. The patient is not nervous/anxious.          Objective:   Physical Exam  Constitutional: She appears well-developed and well-nourished. No distress.       overwt and well appearing   HENT:  Head: Normocephalic and atraumatic.  Mouth/Throat: Oropharynx is clear and moist.  Eyes: Conjunctivae and EOM are normal. Pupils are equal, round, and reactive to light. No scleral icterus.  Neck: Normal range of motion. Neck supple. No JVD present. Carotid bruit is not present. No thyromegaly present.  Cardiovascular: Normal rate, regular rhythm, normal heart sounds and intact distal pulses.   Pulmonary/Chest: Effort normal and breath sounds normal. No respiratory distress. She has no wheezes.  Abdominal: Soft. Bowel sounds are normal. She exhibits no distension, no abdominal bruit and no mass. There is no tenderness.  Musculoskeletal: She exhibits no edema.  Lymphadenopathy:    She has no cervical adenopathy.  Neurological: She is alert. She has normal reflexes. Coordination normal.  Skin: Skin is warm and dry. No rash noted. No erythema. No pallor.  Psychiatric: She has a normal mood and affect.          Assessment & Plan:

## 2011-01-02 ENCOUNTER — Encounter: Payer: Self-pay | Admitting: Family Medicine

## 2011-01-24 ENCOUNTER — Ambulatory Visit (INDEPENDENT_AMBULATORY_CARE_PROVIDER_SITE_OTHER)
Admission: RE | Admit: 2011-01-24 | Discharge: 2011-01-24 | Disposition: A | Discharge status: Home / Self Care | Payer: Medicare Other | Source: Ambulatory Visit | Attending: Family Medicine | Admitting: Family Medicine

## 2011-01-24 ENCOUNTER — Ambulatory Visit (INDEPENDENT_AMBULATORY_CARE_PROVIDER_SITE_OTHER): Payer: Medicare Other | Admitting: Family Medicine

## 2011-01-24 ENCOUNTER — Encounter: Payer: Self-pay | Admitting: Family Medicine

## 2011-01-24 VITALS — BP 140/84 | HR 68 | Temp 98.1°F | Ht 61.5 in | Wt 209.2 lb

## 2011-01-24 DIAGNOSIS — M545 Low back pain, unspecified: Secondary | ICD-10-CM

## 2011-01-24 DIAGNOSIS — M25559 Pain in unspecified hip: Secondary | ICD-10-CM

## 2011-01-24 DIAGNOSIS — M25552 Pain in left hip: Secondary | ICD-10-CM

## 2011-01-24 DIAGNOSIS — Z23 Encounter for immunization: Secondary | ICD-10-CM

## 2011-01-24 MED ORDER — TRAMADOL HCL 50 MG PO TABS: 50.0000 mg | ORAL_TABLET | Freq: Three times a day (TID) | ORAL | Status: AC | PRN

## 2011-01-24 NOTE — Progress Notes (Signed)
Subjective:    Patient ID: Gabriella Howe, female    DOB: [birth year], 76 y.o.   MRN: [id number]  HPI Here with low back pain - (chronic)- that has been worse for 4 days- now running down her L leg when weight bearing Has not done that before  In remote past - happened in R leg and got better   Feels like muscle pain -- more of a dull ache  Sitting is ok  Walking hurts  Does not keep her up at night  No trauma or falls  No numbness in leg or foot ? Weakness- feels like it could possibly give way   Does have hx of deg disk dz Never seen ortho or neurosurg  Last x ray was probably 14 years ago  Patient Active Problem List  Diagnoses  . DIABETES MELLITUS, TYPE II  . HYPERCHOLESTEROLEMIA, PURE  . FLUID RETENTION  . OBESITY  . CARPAL TUNNEL SYNDROME  . HYPERTENSION  . DIASTOLIC HEART FAILURE, CHRONIC  . DIASTOLIC DYSFUNCTION  . REACTIVE AIRWAY DISEASE  . GERD  . ROSACEA  . DEGENERATIVE DISC DISEASE  . SLEEP APNEA  . DYSPNEA  . COLONIC POLYPS, HX OF  . Other screening mammogram  . Low back pain  . Left hip pain   Past Medical History  Diagnosis Date  . CHF (congestive heart failure)     diastolic  . Heart disease, unspecified   . Hypertension   . Hyperlipidemia   . Chest pain, unspecified   . Shortness of breath   . Fluid overload   . GERD (gastroesophageal reflux disease)   . Obesity, unspecified   . Diabetes mellitus     type II  . Unspecified asthma   . Benign neoplasm of colon   . Unspecified sleep apnea   . Carpal tunnel syndrome   . Rosacea   . Degeneration of intervertebral disc, site unspecified   . Personal history of colonic polyps   . Encounter for long-term (current) use of other medications    Past Surgical History  Procedure Date  . Abdominal hysterectomy     partial, fibroids  . Esophagogastroduodenoscopy 2000  . Umbilical hernia repair 01/2004   History  Substance Use Topics  . Smoking status: Never Smoker   . Smokeless tobacco: Not  on file  . Alcohol Use: No   No family history on file. Allergies  Allergen Reactions  . Rofecoxib     REACTION: reaction not known  . Tetanus-Diphtheria Toxoids     REACTION: sick with fever   Current Outpatient Prescriptions on File Prior to Visit  Medication Sig Dispense Refill  . Ascorbic Acid (VITAMIN C) 500 MG tablet Take 500 mg by mouth daily.        . benazepril (LOTENSIN) 20 MG tablet Take 1 tablet (20 mg total) by mouth daily.  90 tablet  3  . CALCIUM-VITAMIN D PO Take 1 tablet by mouth 2 (two) times daily.       . furosemide (LASIX) 40 MG tablet Take 0.5 tablets (20 mg total) by mouth daily.  45 tablet  3  . metFORMIN (GLUCOPHAGE) 1000 MG tablet Take 1 tablet (1,000 mg total) by mouth 2 (two) times daily with a meal.  180 tablet  3  . metroNIDAZOLE (METROGEL) 0.75 % gel Apply topically daily. To affected area on face.Rosacea.  45 g  3  . Multiple Vitamin (MULTIVITAMIN) capsule Take 1 capsule by mouth daily.        Marland Kitchen  Omega-3 Fatty Acids (FISH OIL PO) Take 1 capsule by mouth daily.       Marland Kitchen omeprazole (PRILOSEC) 20 MG capsule Take 1 capsule (20 mg total) by mouth daily.  90 capsule  3  . Pseudoephedrine HCl (SINUS & ALLERGY 12 HOUR PO) Take by mouth as directed.        . simvastatin (ZOCOR) 40 MG tablet Take 1 tablet (40 mg total) by mouth at bedtime.  90 tablet  3  . vitamin E 400 UNIT capsule Take 400 Units by mouth daily.             Review of Systems Review of Systems  Constitutional: Negative for fever, appetite change, fatigue and unexpected weight change.  Eyes: Negative for pain and visual disturbance.  Respiratory: Negative for cough and shortness of breath.   Cardiovascular: Negative for cp or palpitations    Gastrointestinal: Negative for nausea, diarrhea and constipation.  Genitourinary: Negative for urgency and frequency.  Skin: Negative for pallor or rash  MSK pos for back and L leg pain , no acute joint changes   Neurological: Negative for weakness,  light-headedness, numbness and headaches.  Hematological: Negative for adenopathy. Does not bruise/bleed easily.  Psychiatric/Behavioral: Negative for dysphoric mood. The patient is not nervous/anxious.          Objective:   Physical Exam  Constitutional: She appears well-developed and well-nourished. No distress.  HENT:  Head: Normocephalic and atraumatic.  Mouth/Throat: Oropharynx is clear and moist.  Eyes: Conjunctivae and EOM are normal. Pupils are equal, round, and reactive to light.  Neck: Normal range of motion. Neck supple.  Cardiovascular: Normal rate, regular rhythm, normal heart sounds and intact distal pulses.   Pulmonary/Chest: Effort normal and breath sounds normal. No respiratory distress. She has no wheezes.  Abdominal: Soft. Bowel sounds are normal. She exhibits no distension. There is no tenderness.       No suprapubic tenderness    Musculoskeletal: She exhibits tenderness. She exhibits no edema.       Some lower spinal bony tenderness  L4- S1 Tight/ tender paraspinal muscles on the L  Pos SLR to pain in leg  Nl rom R hip, int of L hip is painful Spine- flex 30 deg/ ext none/ L lat bend is painful  Lymphadenopathy:    She has no cervical adenopathy.  Neurological: She is alert. She has normal strength and normal reflexes. No sensory deficit. She exhibits normal muscle tone. Coordination normal.  Skin: Skin is warm and dry. No rash noted. No erythema. No pallor.  Psychiatric: She has a normal mood and affect.          Assessment & Plan:

## 2011-01-24 NOTE — Patient Instructions (Signed)
Xray of low back and hip today  Will update you with results and plan  Use tramadol for pain as needed -sent to [facility] Update me if pain worsens

## 2011-01-25 ENCOUNTER — Telehealth: Payer: Self-pay

## 2011-01-25 NOTE — Telephone Encounter (Signed)
Pt would like lt hip and LS spine results done on 01/25/11. Pt said call back on 01/26/11 would be OK.

## 2011-01-26 ENCOUNTER — Telehealth: Payer: Self-pay | Admitting: Family Medicine

## 2011-01-26 DIAGNOSIS — M545 Low back pain: Secondary | ICD-10-CM

## 2011-01-26 DIAGNOSIS — M25552 Pain in left hip: Secondary | ICD-10-CM

## 2011-01-26 NOTE — Telephone Encounter (Signed)
Patient notified as instructed by telephone. Please see result note for more details.

## 2011-01-26 NOTE — Assessment & Plan Note (Signed)
With back and leg pain  In light of pain in the groin with rom of hip- will check x ray

## 2011-01-26 NOTE — Telephone Encounter (Signed)
Message copied by Judy Pimple on Thu Jan 26, 2011  8:43 PM ------      Message from: Patience Musca      Created: Thu Jan 26, 2011  2:56 PM       Patient notified as instructed by telephone. Pt is agreeable to see orthopedic as soon as possible; pt said she is still hurting. Pt will wait to hear from pt care coordinator. Pt does not care if referral is in [place] or [place].

## 2011-01-26 NOTE — Assessment & Plan Note (Signed)
Ongoing but worse lately , now with some radicular symptoms but no neurologic red flags Xray today  Tramadol for pain  Gentle heat/ walking recommended  Update if worse Will make plan based on xray

## 2011-01-26 NOTE — Telephone Encounter (Signed)
See comments on x rays themselves- routed to [facility]

## 2011-01-26 NOTE — Telephone Encounter (Signed)
Left message for pt to call back  °

## 2011-02-03 ENCOUNTER — Telehealth: Payer: Self-pay | Admitting: Internal Medicine

## 2011-02-03 NOTE — Telephone Encounter (Signed)
Velna Hatchet at [facility] notified as instructed by telephone. Velna Hatchet said she would let pt know about sugar going up on Prednisone because they have not called med in yet.

## 2011-02-03 NOTE — Telephone Encounter (Signed)
Shelia from [facility] called and stated patient was seen for hip pain today and the MD wanted to know if patients blood sugar was under good enough control to be given a 6 day dose pack of Prednisone.  Please advise.

## 2011-02-03 NOTE — Telephone Encounter (Signed)
Good question- yes her a1c was pretty good in august -- just let pt know beforehand that her sugar will go up with the prednisone and then return to baseline thanks

## 2011-04-28 HISTORY — PX: URETERAL STENT PLACEMENT: SHX822

## 2011-05-11 ENCOUNTER — Other Ambulatory Visit: Payer: Medicare Other

## 2011-05-15 ENCOUNTER — Other Ambulatory Visit (INDEPENDENT_AMBULATORY_CARE_PROVIDER_SITE_OTHER): Payer: Medicare Other

## 2011-05-15 DIAGNOSIS — E119 Type 2 diabetes mellitus without complications: Secondary | ICD-10-CM

## 2011-05-15 DIAGNOSIS — E7800 Pure hypercholesterolemia, unspecified: Secondary | ICD-10-CM

## 2011-05-15 DIAGNOSIS — I10 Essential (primary) hypertension: Secondary | ICD-10-CM

## 2011-05-15 LAB — RENAL FUNCTION PANEL
CO2: 30 mEq/L (ref 19–32)
Creatinine, Ser: 0.8 mg/dL (ref 0.4–1.2)
GFR: 78.96 mL/min (ref >60.00)
Potassium: 4.2 mEq/L (ref 3.5–5.1)
Sodium: 142 mEq/L (ref 135–145)

## 2011-05-15 LAB — LIPID PANEL
Cholesterol: 118 mg/dL (ref 0–200)
Triglycerides: 97 mg/dL (ref 0.0–149.0)
VLDL: 19.4 mg/dL (ref 0.0–40.0)

## 2011-05-15 LAB — HEMOGLOBIN A1C: Hgb A1c MFr Bld: 6.7 % — ABNORMAL HIGH (ref 4.6–6.5)

## 2011-05-17 ENCOUNTER — Ambulatory Visit (INDEPENDENT_AMBULATORY_CARE_PROVIDER_SITE_OTHER): Payer: Medicare Other | Admitting: Family Medicine

## 2011-05-17 ENCOUNTER — Encounter: Payer: Self-pay | Admitting: Family Medicine

## 2011-05-17 VITALS — BP 120/68 | HR 68 | Temp 98.2°F | Ht 61.5 in | Wt 204.2 lb

## 2011-05-17 DIAGNOSIS — I10 Essential (primary) hypertension: Secondary | ICD-10-CM

## 2011-05-17 DIAGNOSIS — E119 Type 2 diabetes mellitus without complications: Secondary | ICD-10-CM

## 2011-05-17 DIAGNOSIS — E669 Obesity, unspecified: Secondary | ICD-10-CM

## 2011-05-17 DIAGNOSIS — E7800 Pure hypercholesterolemia, unspecified: Secondary | ICD-10-CM

## 2011-05-17 MED ORDER — BENAZEPRIL HCL 20 MG PO TABS: 20.0000 mg | ORAL_TABLET | Freq: Every day | ORAL | Status: DC

## 2011-05-17 MED ORDER — OMEPRAZOLE 20 MG PO CPDR: 20.0000 mg | DELAYED_RELEASE_CAPSULE | Freq: Every day | ORAL | Status: DC

## 2011-05-17 MED ORDER — FUROSEMIDE 40 MG PO TABS: 20.0000 mg | ORAL_TABLET | Freq: Every day | ORAL | Status: DC

## 2011-05-17 MED ORDER — METFORMIN HCL 1000 MG PO TABS: 1000.0000 mg | ORAL_TABLET | Freq: Two times a day (BID) | ORAL | Status: DC

## 2011-05-17 MED ORDER — SIMVASTATIN 40 MG PO TABS: 40.0000 mg | ORAL_TABLET | Freq: Every day | ORAL | Status: DC

## 2011-05-17 NOTE — Assessment & Plan Note (Signed)
Good control zocor and diet Disc goals for lipids and reasons to control them Rev labs with pt Rev low sat fat diet in detail

## 2011-05-17 NOTE — Assessment & Plan Note (Signed)
Stable and well controlled with metformin and good diet  Enc to keep up wt loss and add exercise  Rev low glycemic diet Rev labs

## 2011-05-17 NOTE — Patient Instructions (Addendum)
Keep up the good work with weight loss Add exercise 5 days per week - work up to 30 minutes  No change in medicines  Follow up in 6 months for annual exam with labs prior

## 2011-05-17 NOTE — Assessment & Plan Note (Signed)
Discussed how this problem influences overall health and the risks it imposes  Reviewed plan for weight loss with lower calorie diet (via better food choices and also portion control or program like weight watchers) and exercise building up to or more than 30 minutes 5 days per week including some aerobic activity    Good effort so far- needs to exercise

## 2011-05-17 NOTE — Assessment & Plan Note (Signed)
bp in fair control at this time  No changes needed  Disc lifstyle change with low sodium diet and exercise   

## 2011-05-17 NOTE — Progress Notes (Signed)
Subjective:    Patient ID: Gabriella Howe, female    DOB: 14-Aug-1934, 77 y.o.   MRN: [id number]  HPI Here for f/u of chronic issues Is doing well overall   bp is  120/68   Today No cp or palpitations or headaches or edema  No side effects to medicines    Obesity Wt is down 5 lb ! - she is working on it , by her scale has lost total over 30 lb  bmi 37 Diet-eating better and smarter- quit sweets and cut portions  Exercise-- not much ,  Has a treadmill, just not motivated  Hit a wall with wt loss    Diabetes Home sugar results -- pretty good - as low as 80s-105 , highest 125 -- just about perfect  Feels a little shaky at 10 am  DM diet - getting better  Exercise -- will get back to Symptoms-no thirst or numbness  A1C last 6.7- stable from last time  No problems with medications -metformin Renal protection- is on ace  Last eye exam 8/12 normal    Lipids Takes zocor and watches diet Lab Results  Component Value Date   CHOL 118 05/15/2011   CHOL 117 08/05/2010   CHOL 122 05/10/2009   Lab Results  Component Value Date   HDL 52.40 05/15/2011   HDL 01.02 08/05/2010   HDL 72.53 05/10/2009   Lab Results  Component Value Date   LDLCALC 46 05/15/2011   LDLCALC 48 08/05/2010   LDLCALC 44 05/10/2009   Lab Results  Component Value Date   TRIG 97.0 05/15/2011   TRIG 132.0 08/05/2010   TRIG 113.0 05/10/2009   Lab Results  Component Value Date   CHOLHDL 2 05/15/2011   CHOLHDL 3 08/05/2010   CHOLHDL 2 05/10/2009   No results found for this basename: LDLDIRECT   good control  Zoster status -- ins[employer]ace will not pay - so cannot afford   Patient Active Problem List  Diagnoses  . DIABETES MELLITUS, TYPE II  . HYPERCHOLESTEROLEMIA, PURE  . FLUID RETENTION  . OBESITY  . CARPAL TUNNEL SYNDROME  . HYPERTENSION  . DIASTOLIC HEART FAILURE, CHRONIC  . DIASTOLIC DYSFUNCTION  . REACTIVE AIRWAY DISEASE  . GERD  . ROSACEA  . DEGENERATIVE DISC DISEASE  . SLEEP APNEA  . DYSPNEA  .  COLONIC POLYPS, HX OF  . Other screening mammogram  . Low back pain  . Left hip pain   Past Medical History  Diagnosis Date  . CHF (congestive heart failure)     diastolic  . Heart disease, unspecified   . Hypertension   . Hyperlipidemia   . Chest pain, unspecified   . Shortness of breath   . Fluid overload   . GERD (gastroesophageal reflux disease)   . Obesity, unspecified   . Diabetes mellitus     type II  . Unspecified asthma   . Benign neoplasm of colon   . Unspecified sleep apnea   . Carpal tunnel syndrome   . Rosacea   . Degeneration of intervertebral disc, site unspecified   . Personal history of colonic polyps   . Encounter for long-term (current) use of other medications    Past Surgical History  Procedure Date  . Abdominal hysterectomy     partial, fibroids  . Esophagogastroduodenoscopy 2000  . Umbilical hernia repair 01/2004   History  Substance Use Topics  . Smoking status: Never Smoker   . Smokeless tobacco: Not on file  . Alcohol  Use: No   No family history on file. Allergies  Allergen Reactions  . Rofecoxib     REACTION: reaction not known  . Tetanus-Diphtheria Toxoids     REACTION: sick with fever   Current Outpatient Prescriptions on File Prior to Visit  Medication Sig Dispense Refill  . Ascorbic Acid (VITAMIN C) 500 MG tablet Take 500 mg by mouth daily.        . Aspirin-Caffeine (BC FAST PAIN RELIEF PO) Take by mouth as directed.        Marland Kitchen CALCIUM-VITAMIN D PO Take 1 tablet by mouth 2 (two) times daily.       Marland Kitchen ibuprofen (ADVIL,MOTRIN) 200 MG tablet Take 400 mg by mouth every 6 (six) hours as needed.        . metroNIDAZOLE (METROGEL) 0.75 % gel Apply topically daily. To affected area on face.Rosacea.  45 g  3  . Multiple Vitamin (MULTIVITAMIN) capsule Take 1 capsule by mouth daily.        . Omega-3 Fatty Acids (FISH OIL PO) Take 1 capsule by mouth daily.       . Pseudoephedrine HCl (SINUS & ALLERGY 12 HOUR PO) Take by mouth as directed.         . traMADol (ULTRAM) 50 MG tablet Take 1 tablet (50 mg total) by mouth every 8 (eight) hours as needed for pain (watch out for sedation ). Maximum dose= 8 tablets per day  30 tablet  0  . vitamin E 400 UNIT capsule Take 400 Units by mouth daily.             Review of Systems Review of Systems  Constitutional: Negative for fever, appetite change, fatigue and unexpected weight change.  Eyes: Negative for pain and visual disturbance.  Respiratory: Negative for cough and shortness of breath.   Cardiovascular: Negative for cp or palpitations    Gastrointestinal: Negative for nausea, diarrhea and constipation.  Genitourinary: Negative for urgency and frequency.  Skin: Negative for pallor or rash   Neurological: Negative for weakness, light-headedness, numbness and headaches.  Hematological: Negative for adenopathy. Does not bruise/bleed easily.  Psychiatric/Behavioral: Negative for dysphoric mood. The patient is not nervous/anxious.          Objective:   Physical Exam  Constitutional: She appears well-developed and well-nourished. No distress.       Obese and well appearing   HENT:  Head: Normocephalic and atraumatic.  Mouth/Throat: Oropharynx is clear and moist.  Eyes: Conjunctivae and EOM are normal. Pupils are equal, round, and reactive to light. No scleral icterus.  Neck: Normal range of motion. Neck supple. No JVD present. Carotid bruit is not present. No thyromegaly present.  Cardiovascular: Normal rate, regular rhythm, normal heart sounds and intact distal pulses.  Exam reveals no gallop.   Pulmonary/Chest: Effort normal and breath sounds normal. No respiratory distress. She has no wheezes.  Abdominal: Soft. Bowel sounds are normal. She exhibits no distension, no abdominal bruit and no mass. There is no tenderness.       Obese abdomen  Musculoskeletal: Normal range of motion. She exhibits no edema and no tenderness.  Lymphadenopathy:    She has no cervical adenopathy.    Neurological: She is alert. She has normal reflexes. No cranial nerve deficit. She exhibits normal muscle tone. Coordination normal.  Skin: Skin is warm and dry. No rash noted. No erythema. No pallor.  Psychiatric: She has a normal mood and affect.          Assessment &  Plan:

## 2011-05-19 ENCOUNTER — Emergency Department: Payer: Self-pay | Admitting: Unknown Physician Specialty

## 2011-05-19 LAB — URINALYSIS, COMPLETE
Bilirubin,UR: NEGATIVE
Glucose,UR: NEGATIVE mg/dL (ref 0–75)
Hyaline Cast: 1
Protein: NEGATIVE
RBC,UR: 16 /HPF (ref 0–5)
Squamous Epithelial: NONE SEEN
WBC UR: 41 /HPF (ref 0–5)

## 2011-05-19 LAB — COMPREHENSIVE METABOLIC PANEL
Alkaline Phosphatase: 58 U/L (ref 50–136)
Anion Gap: 13 (ref 7–16)
BUN: 19 mg/dL — ABNORMAL HIGH (ref 7–18)
Calcium, Total: 10.1 mg/dL (ref 8.5–10.1)
Chloride: 104 mmol/L (ref 98–107)
Co2: 26 mmol/L (ref 21–32)
EGFR (African American): >60
EGFR (Non-African Amer.): 50 — ABNORMAL LOW
Potassium: 3.9 mmol/L (ref 3.5–5.1)
SGOT(AST): 25 U/L (ref 15–37)
SGPT (ALT): 29 U/L
Total Protein: 7.6 g/dL (ref 6.4–8.2)

## 2011-05-19 LAB — MAGNESIUM: Magnesium: 1.5 mg/dL — ABNORMAL LOW

## 2011-05-19 LAB — CBC
MCV: 92 fL (ref 80–100)
WBC: 13.4 10*3/uL — ABNORMAL HIGH (ref 3.6–11.0)

## 2011-05-21 LAB — URINE CULTURE

## 2011-05-22 ENCOUNTER — Inpatient Hospital Stay: Payer: Self-pay | Admitting: Internal Medicine

## 2011-05-22 LAB — COMPREHENSIVE METABOLIC PANEL
Albumin: 2.9 g/dL — ABNORMAL LOW (ref 3.4–5.0)
Bilirubin,Total: 1.9 mg/dL — ABNORMAL HIGH (ref 0.2–1.0)
Calcium, Total: 9.8 mg/dL (ref 8.5–10.1)
Chloride: 102 mmol/L (ref 98–107)
EGFR (African American): >60
Glucose: 164 mg/dL — ABNORMAL HIGH (ref 65–99)
Osmolality: 281 (ref 275–301)
Potassium: 3.7 mmol/L (ref 3.5–5.1)
SGOT(AST): 18 U/L (ref 15–37)
Sodium: 137 mmol/L (ref 136–145)
Total Protein: 6.9 g/dL (ref 6.4–8.2)

## 2011-05-22 LAB — URINALYSIS, COMPLETE
Bilirubin,UR: NEGATIVE
Glucose,UR: NEGATIVE mg/dL (ref 0–75)
Nitrite: POSITIVE
RBC,UR: 1 /HPF (ref 0–5)
Specific Gravity: 1.012 (ref 1.003–1.030)
Squamous Epithelial: <1
WBC UR: 33 /HPF (ref 0–5)

## 2011-05-22 LAB — CBC
Platelet: 152 10*3/uL (ref 150–440)
RBC: 4.16 10*6/uL (ref 3.80–5.20)
WBC: 12.6 10*3/uL — ABNORMAL HIGH (ref 3.6–11.0)

## 2011-05-22 LAB — CULTURE, BLOOD (SINGLE)

## 2011-05-23 LAB — COMPREHENSIVE METABOLIC PANEL
Alkaline Phosphatase: 65 U/L (ref 50–136)
BUN: 13 mg/dL (ref 7–18)
Bilirubin,Total: 1 mg/dL (ref 0.2–1.0)
Calcium, Total: 9.2 mg/dL (ref 8.5–10.1)
Chloride: 107 mmol/L (ref 98–107)
Co2: 27 mmol/L (ref 21–32)
EGFR (African American): >60
EGFR (Non-African Amer.): 53 — ABNORMAL LOW
SGOT(AST): 10 U/L — ABNORMAL LOW (ref 15–37)
SGPT (ALT): 12 U/L
Total Protein: 6.2 g/dL — ABNORMAL LOW (ref 6.4–8.2)

## 2011-05-23 LAB — CBC WITH DIFFERENTIAL/PLATELET
Basophil #: 0 10*3/uL (ref 0.0–0.1)
Eosinophil #: 0 10*3/uL (ref 0.0–0.7)
Eosinophil %: 0.5 %
HCT: 34.6 % — ABNORMAL LOW (ref 35.0–47.0)
Lymphocyte #: 0.6 10*3/uL — ABNORMAL LOW (ref 1.0–3.6)
MCH: 31 pg (ref 26.0–34.0)
MCHC: 33.5 g/dL (ref 32.0–36.0)
MCV: 92 fL (ref 80–100)
Monocyte #: 0.5 10*3/uL (ref 0.0–0.7)
Monocyte %: 6.4 %
Neutrophil #: 6.6 10*3/uL — ABNORMAL HIGH (ref 1.4–6.5)
Neutrophil %: 85 %
RBC: 3.75 10*6/uL — ABNORMAL LOW (ref 3.80–5.20)
RDW: 12.9 % (ref 11.5–14.5)

## 2011-05-23 LAB — HEMOGLOBIN A1C: Hemoglobin A1C: 7.2 % — ABNORMAL HIGH (ref 4.2–6.3)

## 2011-06-01 ENCOUNTER — Ambulatory Visit: Payer: Self-pay | Admitting: Urology

## 2011-06-06 ENCOUNTER — Ambulatory Visit: Payer: Self-pay | Admitting: Urology

## 2011-06-07 ENCOUNTER — Ambulatory Visit (INDEPENDENT_AMBULATORY_CARE_PROVIDER_SITE_OTHER): Payer: Medicare Other | Admitting: Family Medicine

## 2011-06-07 ENCOUNTER — Encounter: Payer: Self-pay | Admitting: Family Medicine

## 2011-06-07 VITALS — BP 120/64 | HR 88 | Temp 97.9°F | Ht 61.5 in | Wt 196.5 lb

## 2011-06-07 DIAGNOSIS — R5383 Other fatigue: Secondary | ICD-10-CM | POA: Insufficient documentation

## 2011-06-07 DIAGNOSIS — N390 Urinary tract infection, site not specified: Secondary | ICD-10-CM

## 2011-06-07 DIAGNOSIS — N200 Calculus of kidney: Secondary | ICD-10-CM

## 2011-06-07 DIAGNOSIS — N1330 Unspecified hydronephrosis: Secondary | ICD-10-CM | POA: Insufficient documentation

## 2011-06-07 LAB — RENAL FUNCTION PANEL
BUN: 17 mg/dL (ref 6–23)
CO2: 30 mEq/L (ref 19–32)
Creatinine, Ser: 0.9 mg/dL (ref 0.4–1.2)
GFR: 64.13 mL/min (ref >60.00)
Glucose, Bld: 129 mg/dL — ABNORMAL HIGH (ref 70–99)
Sodium: 139 mEq/L (ref 135–145)

## 2011-06-07 LAB — CBC WITH DIFFERENTIAL/PLATELET
Basophils Absolute: 0.1 10*3/uL (ref 0.0–0.1)
HCT: 41.1 % (ref 36.0–46.0)
Hemoglobin: 13.6 g/dL (ref 12.0–15.0)
Lymphs Abs: 1.7 10*3/uL (ref 0.7–4.0)
MCHC: 33.1 g/dL (ref 30.0–36.0)
MCV: 92.6 fl (ref 78.0–100.0)
Monocytes Absolute: 0.5 10*3/uL (ref 0.1–1.0)
Monocytes Relative: 5.2 % (ref 3.0–12.0)
Neutro Abs: 7.2 10*3/uL (ref 1.4–7.7)
Platelets: 328 10*3/uL (ref 150.0–400.0)
RDW: 13.6 % (ref 11.5–14.6)

## 2011-06-07 NOTE — Assessment & Plan Note (Signed)
L ureteral stone is almost passed For stent removal with Dr Achilles Dunk on 3/22 Well hydrated and doing better

## 2011-06-07 NOTE — Assessment & Plan Note (Signed)
S/p hosp with ecoli sepsis (?per report neg BC) Finishing cipro - which is making her feel blah Overall improved  Re check cbc with diff today - esp in light of fatigue  For f/u Dr Achilles Dunk to remove stent/ kidney stone

## 2011-06-07 NOTE — Assessment & Plan Note (Signed)
Suspect due to recent hosp and sepsis  Check cbc - slt anemic in past  Will update  Also renal panel

## 2011-06-07 NOTE — Patient Instructions (Signed)
Keep drinking lots of water and continue to follow up with Dr Achilles Dunk Keep eating yogurt and also use over the counter monistat for yeast (Update if not starting to improve in a week or if worsening  ) Labs today  Gradually increase your activity -it will take a while to get back to normal

## 2011-06-07 NOTE — Progress Notes (Signed)
Subjective:    Patient ID: Gabriella Howe, female    DOB: 03-20-1935, 77 y.o.   MRN: [id number]  HPI Here for f/u after hosp 2/25-2/27 at [facility] for uri with sepsis (though BC were neg)  (e coli uti) as well as kidney stone in ureter that requred stenting  Still has the stent and the stone did not pass yet    Sees Dr Achilles Dunk- had an xray yesterday - stone moved some but not all the way to the bladder  Stone passed through the stent -- almost to the bladder Drinking lots of water Will remove the stent 3/22  Hopes stone will pass by then   Still feeling very tired 2 more abx doses to go  cipro ua was clear last Thursday   Has beginning of yeast infection - little vaginal itching - will use otc meds Needs to get strength back   Nothing tastes right-? From abx   Sugars are running ok- 120s-130s   Patient Active Problem List  Diagnoses  . DIABETES MELLITUS, TYPE II  . HYPERCHOLESTEROLEMIA, PURE  . FLUID RETENTION  . OBESITY  . CARPAL TUNNEL SYNDROME  . HYPERTENSION  . DIASTOLIC HEART FAILURE, CHRONIC  . DIASTOLIC DYSFUNCTION  . REACTIVE AIRWAY DISEASE  . GERD  . ROSACEA  . DEGENERATIVE DISC DISEASE  . SLEEP APNEA  . DYSPNEA  . COLONIC POLYPS, HX OF  . Other screening mammogram  . Low back pain  . Left hip pain  . Kidney stone  . UTI (lower urinary tract infection)  . Fatigue   Past Medical History  Diagnosis Date  . CHF (congestive heart failure)     diastolic  . Heart disease, unspecified   . Hypertension   . Hyperlipidemia   . Chest pain, unspecified   . Shortness of breath   . Fluid overload   . GERD (gastroesophageal reflux disease)   . Obesity, unspecified   . Diabetes mellitus     type II  . Unspecified asthma   . Benign neoplasm of colon   . Unspecified sleep apnea   . Carpal tunnel syndrome   . Rosacea   . Degeneration of intervertebral disc, site unspecified   . Personal history of colonic polyps   . Encounter for long-term (current) use of  other medications   . Kidney stone 3/12    hosp/ with stent   Past Surgical History  Procedure Date  . Abdominal hysterectomy     partial, fibroids  . Esophagogastroduodenoscopy 2000  . Umbilical hernia repair 01/2004  . Ureteral stent placement 2/13    for L sided stone/ ecoli sepsis   History  Substance Use Topics  . Smoking status: Never Smoker   . Smokeless tobacco: Not on file  . Alcohol Use: No   No family history on file. Allergies  Allergen Reactions  . Rofecoxib     REACTION: reaction not known  . Tetanus-Diphtheria Toxoids     REACTION: sick with fever   Current Outpatient Prescriptions on File Prior to Visit  Medication Sig Dispense Refill  . Ascorbic Acid (VITAMIN C) 500 MG tablet Take 500 mg by mouth daily.        . Aspirin-Caffeine (BC FAST PAIN RELIEF PO) Take by mouth as directed.        . benazepril (LOTENSIN) 20 MG tablet Take 1 tablet (20 mg total) by mouth daily.  90 tablet  3  . CALCIUM-VITAMIN D PO Take 1 tablet by mouth 2 (two)  times daily.       . furosemide (LASIX) 40 MG tablet Take 0.5 tablets (20 mg total) by mouth daily.  45 tablet  3  . ibuprofen (ADVIL,MOTRIN) 200 MG tablet Take 400 mg by mouth every 6 (six) hours as needed.        . metFORMIN (GLUCOPHAGE) 1000 MG tablet Take 1 tablet (1,000 mg total) by mouth 2 (two) times daily with a meal.  180 tablet  3  . metroNIDAZOLE (METROGEL) 0.75 % gel Apply topically daily. To affected area on face.Rosacea.  45 g  3  . Multiple Vitamin (MULTIVITAMIN) capsule Take 1 capsule by mouth daily.        . Omega-3 Fatty Acids (FISH OIL PO) Take 1 capsule by mouth daily.       Marland Kitchen omeprazole (PRILOSEC) 20 MG capsule Take 1 capsule (20 mg total) by mouth daily.  90 capsule  3  . Pseudoephedrine HCl (SINUS & ALLERGY 12 HOUR PO) Take by mouth as directed.        . simvastatin (ZOCOR) 40 MG tablet Take 1 tablet (40 mg total) by mouth at bedtime.  90 tablet  3  . traMADol (ULTRAM) 50 MG tablet Take 1 tablet (50 mg  total) by mouth every 8 (eight) hours as needed for pain (watch out for sedation ). Maximum dose= 8 tablets per day  30 tablet  0  . vitamin E 400 UNIT capsule Take 400 Units by mouth daily.                  Review of Systems Review of Systems  Constitutional: Negative for fever, appetite change,  and unexpected weight change. pos for fatigue  Eyes: Negative for pain and visual disturbance.  Respiratory: Negative for cough and shortness of breath.   Cardiovascular: Negative for cp or palpitations    Gastrointestinal: Negative for nausea, diarrhea and constipation.  Genitourinary: Negative for urgency and frequency. pos for mild flank pain that is overall much improved  Skin: Negative for pallor or rash   Neurological: Negative for weakness, light-headedness, numbness and headaches.  Hematological: Negative for adenopathy. Does not bruise/bleed easily.  Psychiatric/Behavioral: Negative for dysphoric mood. The patient is not nervous/anxious.          Objective:   Physical Exam  Constitutional: She appears well-developed and well-nourished. No distress.       Obese and well appearing   HENT:  Head: Normocephalic and atraumatic.  Right Ear: External ear normal.  Left Ear: External ear normal.  Mouth/Throat: Oropharynx is clear and moist.  Eyes: Conjunctivae and EOM are normal. Pupils are equal, round, and reactive to light. No scleral icterus.  Neck: Normal range of motion. Neck supple. No JVD present. Carotid bruit is not present. No thyromegaly present.  Cardiovascular: Normal rate, regular rhythm, normal heart sounds and intact distal pulses.  Exam reveals no gallop.   Pulmonary/Chest: Effort normal and breath sounds normal. No respiratory distress. She has no wheezes.  Abdominal: Soft. Bowel sounds are normal. She exhibits no distension, no abdominal bruit and no mass. There is tenderness. There is no rebound and no guarding.       Mild LLQ and L flank tenderness - no M felt    Musculoskeletal: She exhibits no edema.  Lymphadenopathy:    She has no cervical adenopathy.  Neurological: She is alert. She has normal reflexes. No cranial nerve deficit. She exhibits normal muscle tone. Coordination normal.  Skin: Skin is warm and dry. No  rash noted. No erythema. No pallor.  Psychiatric: She has a normal mood and affect.          Assessment & Plan:

## 2011-06-16 ENCOUNTER — Ambulatory Visit: Payer: Self-pay | Admitting: Urology

## 2011-10-19 ENCOUNTER — Ambulatory Visit: Payer: Self-pay | Admitting: Urology

## 2011-11-13 ENCOUNTER — Telehealth: Payer: Self-pay | Admitting: Family Medicine

## 2011-11-13 DIAGNOSIS — E7800 Pure hypercholesterolemia, unspecified: Secondary | ICD-10-CM

## 2011-11-13 DIAGNOSIS — I10 Essential (primary) hypertension: Secondary | ICD-10-CM

## 2011-11-13 DIAGNOSIS — E119 Type 2 diabetes mellitus without complications: Secondary | ICD-10-CM

## 2011-11-13 DIAGNOSIS — K219 Gastro-esophageal reflux disease without esophagitis: Secondary | ICD-10-CM

## 2011-11-13 LAB — HM DIABETES EYE EXAM: HM Diabetic Eye Exam: NORMAL

## 2011-11-13 NOTE — Telephone Encounter (Signed)
Message copied by Judy Pimple on Mon Nov 13, 2011 10:10 PM ------      Message from: Alvina Chou      Created: Thu Nov 09, 2011  4:15 PM      Regarding: lab orders for Tue, 8.20.13       Patient is scheduled for CPX labs, please order future labs, Thanks , Camelia Eng

## 2011-11-14 ENCOUNTER — Other Ambulatory Visit (INDEPENDENT_AMBULATORY_CARE_PROVIDER_SITE_OTHER): Payer: Medicare Other

## 2011-11-14 DIAGNOSIS — I10 Essential (primary) hypertension: Secondary | ICD-10-CM

## 2011-11-14 DIAGNOSIS — E119 Type 2 diabetes mellitus without complications: Secondary | ICD-10-CM

## 2011-11-14 DIAGNOSIS — K219 Gastro-esophageal reflux disease without esophagitis: Secondary | ICD-10-CM

## 2011-11-14 DIAGNOSIS — E7800 Pure hypercholesterolemia, unspecified: Secondary | ICD-10-CM

## 2011-11-14 LAB — CBC WITH DIFFERENTIAL/PLATELET
Basophils Absolute: 0 10*3/uL (ref 0.0–0.1)
Eosinophils Relative: 3.5 % (ref 0.0–5.0)
MCV: 92.5 fl (ref 78.0–100.0)
Monocytes Absolute: 0.3 10*3/uL (ref 0.1–1.0)
Monocytes Relative: 5.8 % (ref 3.0–12.0)
Neutrophils Relative %: 55.4 % (ref 43.0–77.0)
Platelets: 184 10*3/uL (ref 150.0–400.0)
RDW: 13.2 % (ref 11.5–14.6)
WBC: 5.7 10*3/uL (ref 4.5–10.5)

## 2011-11-14 LAB — COMPREHENSIVE METABOLIC PANEL
AST: 19 U/L (ref 0–37)
Albumin: 4.1 g/dL (ref 3.5–5.2)
Alkaline Phosphatase: 65 U/L (ref 39–117)
Potassium: 4.1 mEq/L (ref 3.5–5.1)
Sodium: 142 mEq/L (ref 135–145)
Total Protein: 7.1 g/dL (ref 6.0–8.3)

## 2011-11-14 LAB — LIPID PANEL
HDL: 56.8 mg/dL (ref >39.00)
Total CHOL/HDL Ratio: 2
Triglycerides: 93 mg/dL (ref 0.0–149.0)
VLDL: 18.6 mg/dL (ref 0.0–40.0)

## 2011-11-17 ENCOUNTER — Encounter: Payer: Self-pay | Admitting: Family Medicine

## 2011-11-17 ENCOUNTER — Ambulatory Visit (INDEPENDENT_AMBULATORY_CARE_PROVIDER_SITE_OTHER): Payer: Medicare Other | Admitting: Family Medicine

## 2011-11-17 VITALS — BP 118/64 | HR 68 | Temp 97.9°F | Ht 62.0 in | Wt 200.2 lb

## 2011-11-17 DIAGNOSIS — Z1231 Encounter for screening mammogram for malignant neoplasm of breast: Secondary | ICD-10-CM

## 2011-11-17 DIAGNOSIS — Z860100 Personal history of colon polyps, unspecified: Secondary | ICD-10-CM

## 2011-11-17 DIAGNOSIS — E119 Type 2 diabetes mellitus without complications: Secondary | ICD-10-CM

## 2011-11-17 DIAGNOSIS — I10 Essential (primary) hypertension: Secondary | ICD-10-CM

## 2011-11-17 DIAGNOSIS — E7800 Pure hypercholesterolemia, unspecified: Secondary | ICD-10-CM

## 2011-11-17 NOTE — Assessment & Plan Note (Signed)
Mam ordered  She declines exam today- states she does her own and no changes

## 2011-11-17 NOTE — Patient Instructions (Addendum)
If you are interested in a shingles/zoster vaccine - call your insurance to check on coverage,( you should not get it within 1 month of other vaccines) , then call us for a prescription  for it to take to a pharmacy that gives the shot   We will refer you for a mammogram at check out  Call and let me know what brand of diabetic supplies (what you need) -- to check sugar several times per week (or call the pharmacy and have them send [place] a request) Take metformin 500 mg twice daily  Follow up with me in 6 months with labs prior

## 2011-11-17 NOTE — Assessment & Plan Note (Signed)
utd colonosc 

## 2011-11-17 NOTE — Assessment & Plan Note (Signed)
Good control  Pt will start splitting metformin to 500 bid and let me know if problems Disc low glycemic diet and exercise Nl foot exam  opthy utd Will call with brand of DM supplies - checks qd or less often

## 2011-11-17 NOTE — Assessment & Plan Note (Signed)
bp in fair control at this time  No changes needed  Disc lifstyle change with low sodium diet and exercise  Labs rev with pt 

## 2011-11-17 NOTE — Assessment & Plan Note (Signed)
Disc goals for lipids and reasons to control them Rev labs with pt Rev low sat fat diet in detail  Good control with statin and diet  

## 2011-11-17 NOTE — Progress Notes (Signed)
Subjective:    Patient ID: Gabriella Howe, female    DOB: [birth year], 77 y.o.   MRN: [id number]  HPI Here for check up of chronic medical conditions and to review health mt list   Has been feeling pretty good   bp is stable today  No cp or palpitations or headaches or edema  No side effects to medicines  BP Readings from Last 3 Encounters:  11/17/11 118/64  06/07/11 120/64  05/17/11 120/68      Wt is up 5 lb with bmi of 36 On her scale at home has not gained at all  ? Some fluid retention - has not taken her fluid pill yet today  Diabetes Home sugar results  DM diet - watches her diet very carefully  Exercise -- not regular , is generally active , likes to walk on treadmill- back really bothers her Symptoms A1C last  Lab Results  Component Value Date   HGBA1C 6.6* 11/14/2011  that is down from 6.7  No problems with medications - does want to cut metformin to 500 bid  Renal protection- on ace Last eye exam - was this mo, no retiop or changes   dexa nl 01- no problems  No fam hx of bone loss Takes her ca and D Does not think she needs another dexa   Hx of kidney stones Takes vit C- ? About that   Lipids- on zocor Lab Results  Component Value Date   CHOL 128 11/14/2011   CHOL 118 05/15/2011   CHOL 117 08/05/2010   Lab Results  Component Value Date   HDL 56.80 11/14/2011   HDL 59.56 05/15/2011   HDL 38.75 08/05/2010   Lab Results  Component Value Date   LDLCALC 53 11/14/2011   LDLCALC 46 05/15/2011   LDLCALC 48 08/05/2010   Lab Results  Component Value Date   TRIG 93.0 11/14/2011   TRIG 97.0 05/15/2011   TRIG 132.0 08/05/2010   Lab Results  Component Value Date   CHOLHDL 2 11/14/2011   CHOLHDL 2 05/15/2011   CHOLHDL 3 08/05/2010   No results found for this basename: LDLDIRECT  at goal Has changed her diet a lot -- no sat fats   Allergic to Td utd ptx Zoster status- has had shingles 3 times  Ins covers part of her shot - will consider that colonosc - up  to date   Patient Active Problem List  Diagnosis  . DIABETES MELLITUS, TYPE II  . HYPERCHOLESTEROLEMIA, PURE  . FLUID RETENTION  . OBESITY  . CARPAL TUNNEL SYNDROME  . HYPERTENSION  . DIASTOLIC HEART FAILURE, CHRONIC  . DIASTOLIC DYSFUNCTION  . REACTIVE AIRWAY DISEASE  . GERD  . ROSACEA  . DEGENERATIVE DISC DISEASE  . SLEEP APNEA  . DYSPNEA  . COLONIC POLYPS, HX OF  . Other screening mammogram  . Low back pain  . Left hip pain  . Kidney stone  . UTI (lower urinary tract infection)  . Fatigue   Past Medical History  Diagnosis Date  . CHF (congestive heart failure)     diastolic  . Heart disease, unspecified   . Hypertension   . Hyperlipidemia   . Chest pain, unspecified   . Shortness of breath   . Fluid overload   . GERD (gastroesophageal reflux disease)   . Obesity, unspecified   . Diabetes mellitus     type II  . Unspecified asthma   . Benign neoplasm of colon   . Unspecified  sleep apnea   . Carpal tunnel syndrome   . Rosacea   . Degeneration of intervertebral disc, site unspecified   . Personal history of colonic polyps   . Encounter for long-term (current) use of other medications   . Kidney stone 3/12    hosp/ with stent   Past Surgical History  Procedure Date  . Abdominal hysterectomy     partial, fibroids  . Esophagogastroduodenoscopy 2000  . Umbilical hernia repair 01/2004  . Ureteral stent placement 2/13    for L sided stone/ ecoli sepsis   History  Substance Use Topics  . Smoking status: Never Smoker   . Smokeless tobacco: Not on file  . Alcohol Use: No   No family history on file. Allergies  Allergen Reactions  . Rofecoxib     REACTION: reaction not known  . Tetanus-Diphtheria Toxoids Td     REACTION: sick with fever  . Antihistamines, Chlorpheniramine-Type Palpitations  . Sulfa Antibiotics Rash   Current Outpatient Prescriptions on File Prior to Visit  Medication Sig Dispense Refill  . Ascorbic Acid (VITAMIN C) 500 MG tablet  Take 500 mg by mouth daily.        . Aspirin-Caffeine (BC FAST PAIN RELIEF PO) Take by mouth as directed.        . benazepril (LOTENSIN) 20 MG tablet Take 1 tablet (20 mg total) by mouth daily.  90 tablet  3  . CALCIUM-VITAMIN D PO Take 1 tablet by mouth 2 (two) times daily.       . furosemide (LASIX) 40 MG tablet Take 0.5 tablets (20 mg total) by mouth daily.  45 tablet  3  . ibuprofen (ADVIL,MOTRIN) 200 MG tablet Take 400 mg by mouth every 6 (six) hours as needed.        . metFORMIN (GLUCOPHAGE) 1000 MG tablet Take 500 mg by mouth 2 (two) times daily with a meal.      . metroNIDAZOLE (METROGEL) 0.75 % gel Apply topically daily. To affected area on face.Rosacea.  45 g  3  . Multiple Vitamin (MULTIVITAMIN) capsule Take 1 capsule by mouth daily.        . Omega-3 Fatty Acids (FISH OIL PO) Take 1 capsule by mouth daily.       Marland Kitchen omeprazole (PRILOSEC) 20 MG capsule Take 1 capsule (20 mg total) by mouth daily.  90 capsule  3  . Pseudoephedrine HCl (SINUS & ALLERGY 12 HOUR PO) Take by mouth as directed.        . simvastatin (ZOCOR) 40 MG tablet Take 1 tablet (40 mg total) by mouth at bedtime.  90 tablet  3  . vitamin E 400 UNIT capsule Take 400 Units by mouth daily.        . ciprofloxacin (CIPRO) 750 MG tablet Take 1 tablet by mouth Twice daily.      Marland Kitchen HYDROcodone-acetaminophen (NORCO) 5-325 MG per tablet Take 1 tablet by mouth. Every  8 hours as needed      . traMADol (ULTRAM) 50 MG tablet Take 1 tablet (50 mg total) by mouth every 8 (eight) hours as needed for pain (watch out for sedation ). Maximum dose= 8 tablets per day  30 tablet  0        Review of Systems Review of Systems  Constitutional: Negative for fever, appetite change, fatigue and unexpected weight change.  Eyes: Negative for pain and visual disturbance.  Respiratory: Negative for cough and shortness of breath.   Cardiovascular: Negative for cp or palpitations  Gastrointestinal: Negative for nausea, diarrhea and constipation.    Genitourinary: Negative for urgency and frequency.  Skin: Negative for pallor or rash   Neurological: Negative for weakness, light-headedness, numbness and headaches.  Hematological: Negative for adenopathy. Does not bruise/bleed easily.  Psychiatric/Behavioral: Negative for dysphoric mood. The patient is not nervous/anxious.         Objective:   Physical Exam  Constitutional: She appears well-developed and well-nourished. No distress.       obese and well appearing   HENT:  Head: Normocephalic and atraumatic.  Right Ear: External ear normal.  Left Ear: External ear normal.  Nose: Nose normal.  Mouth/Throat: Oropharynx is clear and moist.  Eyes: Conjunctivae and EOM are normal. Pupils are equal, round, and reactive to light. No scleral icterus.  Neck: Normal range of motion. Neck supple. No JVD present. Carotid bruit is not present. No thyromegaly present.  Cardiovascular: Normal rate, regular rhythm, normal heart sounds and intact distal pulses.  Exam reveals no gallop.   Pulmonary/Chest: Effort normal and breath sounds normal. No respiratory distress. She has no wheezes.  Abdominal: Soft. Bowel sounds are normal. She exhibits no distension, no abdominal bruit and no mass. There is no tenderness.  Genitourinary:       Declines breast exam today  Musculoskeletal: Normal range of motion. She exhibits no edema and no tenderness.  Lymphadenopathy:    She has no cervical adenopathy.  Neurological: She is alert. She has normal reflexes. No cranial nerve deficit. Coordination normal.  Skin: Skin is warm and dry. No rash noted. No erythema. No pallor.  Psychiatric: She has a normal mood and affect.          Assessment & Plan:

## 2011-11-20 ENCOUNTER — Encounter: Payer: Medicare Other | Admitting: Family Medicine

## 2011-12-12 ENCOUNTER — Ambulatory Visit
Admission: RE | Admit: 2011-12-12 | Discharge: 2011-12-12 | Disposition: A | Discharge status: Home / Self Care | Payer: Medicare Other | Source: Ambulatory Visit | Attending: Family Medicine | Admitting: Family Medicine

## 2011-12-12 DIAGNOSIS — Z1231 Encounter for screening mammogram for malignant neoplasm of breast: Secondary | ICD-10-CM

## 2011-12-21 ENCOUNTER — Telehealth: Payer: Self-pay | Admitting: Family Medicine

## 2011-12-21 DIAGNOSIS — R928 Other abnormal and inconclusive findings on diagnostic imaging of breast: Secondary | ICD-10-CM | POA: Insufficient documentation

## 2011-12-21 NOTE — Telephone Encounter (Signed)
Ref for [place] and mam L breast done

## 2011-12-21 NOTE — Telephone Encounter (Signed)
Message copied by Judy Pimple on Thu Dec 21, 2011  5:22 PM ------      Message from: Shon Millet      Created: Thu Dec 21, 2011  8:50 AM       Notified pt follow up mammo and ultrasound of L breast is reccomended, pt hasn't made an appt. and she said she needs a referral

## 2011-12-28 ENCOUNTER — Ambulatory Visit
Admission: RE | Admit: 2011-12-28 | Discharge: 2011-12-28 | Disposition: A | Discharge status: Home / Self Care | Payer: Medicare Other | Source: Ambulatory Visit | Attending: Family Medicine | Admitting: Family Medicine

## 2011-12-28 DIAGNOSIS — R928 Other abnormal and inconclusive findings on diagnostic imaging of breast: Secondary | ICD-10-CM

## 2012-03-01 ENCOUNTER — Other Ambulatory Visit: Payer: Self-pay | Admitting: *Deleted

## 2012-03-01 MED ORDER — SIMVASTATIN 40 MG PO TABS: 40.0000 mg | ORAL_TABLET | Freq: Every day | ORAL | Status: DC

## 2012-03-01 MED ORDER — FUROSEMIDE 40 MG PO TABS: 20.0000 mg | ORAL_TABLET | Freq: Every day | ORAL | Status: DC

## 2012-03-01 MED ORDER — BENAZEPRIL HCL 20 MG PO TABS: 20.0000 mg | ORAL_TABLET | Freq: Every day | ORAL | Status: DC

## 2012-03-01 MED ORDER — OMEPRAZOLE 20 MG PO CPDR: 20.0000 mg | DELAYED_RELEASE_CAPSULE | Freq: Every day | ORAL | Status: DC

## 2012-05-13 ENCOUNTER — Telehealth: Payer: Self-pay | Admitting: Family Medicine

## 2012-05-13 DIAGNOSIS — I10 Essential (primary) hypertension: Secondary | ICD-10-CM

## 2012-05-13 DIAGNOSIS — K219 Gastro-esophageal reflux disease without esophagitis: Secondary | ICD-10-CM

## 2012-05-13 DIAGNOSIS — E119 Type 2 diabetes mellitus without complications: Secondary | ICD-10-CM

## 2012-05-13 DIAGNOSIS — E7800 Pure hypercholesterolemia, unspecified: Secondary | ICD-10-CM

## 2012-05-13 NOTE — Telephone Encounter (Signed)
Message copied by Judy Pimple on Mon May 13, 2012  9:15 PM ------      Message from: Joaquim Nam      Created: Wed May 08, 2012  2:22 PM      Regarding: FW: Lab orders for Tuesday, 2.18.14       To PCP      ----- Message -----         From: Alvina Chou         Sent: 05/07/2012   3:32 PM           To: Joaquim Nam, MD      Subject: Lab orders for Tuesday, 2.18.14                          Patient is scheduled for CPX labs, please order future labs, Thanks , Terri             ------

## 2012-05-13 NOTE — Telephone Encounter (Signed)
Message copied by Judy Pimple on Mon May 13, 2012  9:17 PM ------      Message from: Alvina Chou      Created: Tue May 07, 2012  3:31 PM      Regarding: Lab orders for Tuesday, 2.18.14       Labs for a f/u appt ------

## 2012-05-14 ENCOUNTER — Other Ambulatory Visit (INDEPENDENT_AMBULATORY_CARE_PROVIDER_SITE_OTHER): Payer: Medicare Other

## 2012-05-14 DIAGNOSIS — E7800 Pure hypercholesterolemia, unspecified: Secondary | ICD-10-CM

## 2012-05-14 DIAGNOSIS — I10 Essential (primary) hypertension: Secondary | ICD-10-CM

## 2012-05-14 DIAGNOSIS — E119 Type 2 diabetes mellitus without complications: Secondary | ICD-10-CM

## 2012-05-14 DIAGNOSIS — K219 Gastro-esophageal reflux disease without esophagitis: Secondary | ICD-10-CM

## 2012-05-14 LAB — LIPID PANEL
Cholesterol: 129 mg/dL (ref 0–200)
LDL Cholesterol: 48 mg/dL (ref 0–99)
Triglycerides: 130 mg/dL (ref 0.0–149.0)

## 2012-05-14 LAB — COMPREHENSIVE METABOLIC PANEL
AST: 21 U/L (ref 0–37)
BUN: 24 mg/dL — ABNORMAL HIGH (ref 6–23)
Calcium: 9.8 mg/dL (ref 8.4–10.5)
Chloride: 106 mEq/L (ref 96–112)
Creatinine, Ser: 0.8 mg/dL (ref 0.4–1.2)
GFR: 76.42 mL/min (ref >60.00)

## 2012-05-14 LAB — CBC WITH DIFFERENTIAL/PLATELET
Basophils Relative: 0.4 % (ref 0.0–3.0)
Eosinophils Absolute: 0.2 10*3/uL (ref 0.0–0.7)
HCT: 42.1 % (ref 36.0–46.0)
Hemoglobin: 14.3 g/dL (ref 12.0–15.0)
Lymphocytes Relative: 27.6 % (ref 12.0–46.0)
Lymphs Abs: 1.8 10*3/uL (ref 0.7–4.0)
MCHC: 33.8 g/dL (ref 30.0–36.0)
MCV: 91.4 fl (ref 78.0–100.0)
Neutro Abs: 4 10*3/uL (ref 1.4–7.7)
RBC: 4.6 Mil/uL (ref 3.87–5.11)

## 2012-05-20 ENCOUNTER — Encounter: Payer: Self-pay | Admitting: Family Medicine

## 2012-05-20 ENCOUNTER — Ambulatory Visit (INDEPENDENT_AMBULATORY_CARE_PROVIDER_SITE_OTHER): Payer: Medicare Other | Admitting: Family Medicine

## 2012-05-20 VITALS — BP 136/68 | HR 88 | Temp 97.9°F | Ht 62.0 in | Wt 206.2 lb

## 2012-05-20 DIAGNOSIS — I10 Essential (primary) hypertension: Secondary | ICD-10-CM

## 2012-05-20 DIAGNOSIS — E119 Type 2 diabetes mellitus without complications: Secondary | ICD-10-CM

## 2012-05-20 MED ORDER — SIMVASTATIN 40 MG PO TABS: 40.0000 mg | ORAL_TABLET | Freq: Every day | ORAL | Status: DC

## 2012-05-20 MED ORDER — METFORMIN HCL 1000 MG PO TABS: 500.0000 mg | ORAL_TABLET | Freq: Two times a day (BID) | ORAL | Status: DC

## 2012-05-20 MED ORDER — FUROSEMIDE 40 MG PO TABS: 20.0000 mg | ORAL_TABLET | Freq: Every day | ORAL | Status: DC

## 2012-05-20 MED ORDER — BENAZEPRIL HCL 20 MG PO TABS: 20.0000 mg | ORAL_TABLET | Freq: Every day | ORAL | Status: DC

## 2012-05-20 MED ORDER — OMEPRAZOLE 20 MG PO CPDR: 20.0000 mg | DELAYED_RELEASE_CAPSULE | Freq: Every day | ORAL | Status: DC

## 2012-05-20 NOTE — Patient Instructions (Addendum)
Work hard on diet and exercise for weight loss and diabetes  Labs are stable  Follow up in 6 months for annual exam with labs prior

## 2012-05-20 NOTE — Progress Notes (Signed)
Subjective:    Patient ID: Gabriella Howe, female    DOB: Jan 23, 1935, 78 y.o.   MRN: [id number]  HPI Here for f/u of chronic conditions  Has been feeling pretty good   Wt is up 6 lb with bmi of 37- is trying to get back on track with diet  She was working a lot and eating out for a while   Lipid control is good zocor and diet Lab Results  Component Value Date   CHOL 129 05/14/2012   HDL 54.80 05/14/2012   LDLCALC 48 05/14/2012   TRIG 130.0 05/14/2012   CHOLHDL 2 05/14/2012    Diabetes Home sugar results - doing well/ doing fine , no hypoglycemia if she eats properly and does not skip meals  DM diet -not the best  Exercise not a lot - tries to walk , she does housework and chores  Symptoms-none  A1C last  Lab Results  Component Value Date   HGBA1C 6.7* 05/14/2012   That is up from 6.6 No problems with medications - splitting metformin 500 bid now  Renal protection- ace Last eye exam up to date   bp is stable today  No cp or palpitations or headaches or edema  No side effects to medicines  BP Readings from Last 3 Encounters:  05/20/12 136/68  11/17/11 118/64  06/07/11 120/64       Patient Active Problem List  Diagnosis  . DIABETES MELLITUS, TYPE II  . HYPERCHOLESTEROLEMIA, PURE  . FLUID RETENTION  . OBESITY  . CARPAL TUNNEL SYNDROME  . HYPERTENSION  . DIASTOLIC HEART FAILURE, CHRONIC  . DIASTOLIC DYSFUNCTION  . REACTIVE AIRWAY DISEASE  . GERD  . ROSACEA  . DEGENERATIVE DISC DISEASE  . SLEEP APNEA  . DYSPNEA  . COLONIC POLYPS, HX OF  . Other screening mammogram  . Kidney stone  . Abnormal mammogram   Past Medical History  Diagnosis Date  . CHF (congestive heart failure)     diastolic  . Heart disease, unspecified   . Hypertension   . Hyperlipidemia   . Chest pain, unspecified   . Shortness of breath   . Fluid overload   . GERD (gastroesophageal reflux disease)   . Obesity, unspecified   . Diabetes mellitus     type II  . Unspecified asthma    . Benign neoplasm of colon   . Unspecified sleep apnea   . Carpal tunnel syndrome   . Rosacea   . Degeneration of intervertebral disc, site unspecified   . Personal history of colonic polyps   . Encounter for long-term (current) use of other medications   . Kidney stone 3/12    hosp/ with stent   Past Surgical History  Procedure Laterality Date  . Abdominal hysterectomy      partial, fibroids  . Esophagogastroduodenoscopy  2000  . Umbilical hernia repair  01/2004  . Ureteral stent placement  2/13    for L sided stone/ ecoli sepsis   History  Substance Use Topics  . Smoking status: Never Smoker   . Smokeless tobacco: Not on file  . Alcohol Use: No   No family history on file. Allergies  Allergen Reactions  . Rofecoxib     REACTION: reaction not known  . Tetanus-Diphtheria Toxoids Td     REACTION: sick with fever  . Antihistamines, Chlorpheniramine-Type Palpitations  . Sulfa Antibiotics Rash   Current Outpatient Prescriptions on File Prior to Visit  Medication Sig Dispense Refill  . Ascorbic Acid (  VITAMIN C) 500 MG tablet Take 500 mg by mouth daily.        . Aspirin-Caffeine (BC FAST PAIN RELIEF PO) Take by mouth as directed.        . benazepril (LOTENSIN) 20 MG tablet Take 1 tablet (20 mg total) by mouth daily.  90 tablet  0  . CALCIUM-VITAMIN D PO Take 1 tablet by mouth 2 (two) times daily.       . furosemide (LASIX) 40 MG tablet Take 0.5 tablets (20 mg total) by mouth daily.  45 tablet  1  . HYDROcodone-acetaminophen (NORCO) 5-325 MG per tablet Take 1 tablet by mouth. Every  8 hours as needed      . ibuprofen (ADVIL,MOTRIN) 200 MG tablet Take 400 mg by mouth every 6 (six) hours as needed.        . Magnesium 250 MG TABS Take by mouth daily.      . metFORMIN (GLUCOPHAGE) 1000 MG tablet Take 500 mg by mouth 2 (two) times daily with a meal.      . metroNIDAZOLE (METROGEL) 0.75 % gel Apply topically daily. To affected area on face.Rosacea.  45 g  3  . Multiple Vitamin  (MULTIVITAMIN) capsule Take 1 capsule by mouth daily.        . Omega-3 Fatty Acids (FISH OIL PO) Take 1 capsule by mouth daily.       Marland Kitchen omeprazole (PRILOSEC) 20 MG capsule Take 1 capsule (20 mg total) by mouth daily.  90 capsule  1  . Pseudoephedrine HCl (SINUS & ALLERGY 12 HOUR PO) Take by mouth as directed.        . simvastatin (ZOCOR) 40 MG tablet Take 1 tablet (40 mg total) by mouth at bedtime.  90 tablet  1  . vitamin E 400 UNIT capsule Take 400 Units by mouth daily.         No current facility-administered medications on file prior to visit.      Review of Systems Review of Systems  Constitutional: Negative for fever, appetite change, fatigue and unexpected weight change.  Eyes: Negative for pain and visual disturbance.  Respiratory: Negative for cough and shortness of breath.   Cardiovascular: Negative for cp or palpitations    Gastrointestinal: Negative for nausea, diarrhea and constipation.  Genitourinary: Negative for urgency and frequency.  Skin: Negative for pallor or rash   MSK pos for chronic back pain  Neurological: Negative for weakness, light-headedness, numbness and headaches.  Hematological: Negative for adenopathy. Does not bruise/bleed easily.  Psychiatric/Behavioral: Negative for dysphoric mood. The patient is not nervous/anxious.         Objective:   Physical Exam  Constitutional: She appears well-developed and well-nourished. No distress.  obese and well appearing   HENT:  Head: Normocephalic and atraumatic.  Mouth/Throat: Oropharynx is clear and moist.  Eyes: Conjunctivae and EOM are normal. Pupils are equal, round, and reactive to light. No scleral icterus.  Neck: Normal range of motion. Neck supple. Carotid bruit is not present. No thyromegaly present.  Cardiovascular: Normal rate, regular rhythm, normal heart sounds and intact distal pulses.  Exam reveals no gallop.   Pulmonary/Chest: Breath sounds normal. No respiratory distress. She has no wheezes.   Abdominal: She exhibits no abdominal bruit.  Musculoskeletal: She exhibits no edema.  Lymphadenopathy:    She has no cervical adenopathy.  Neurological: She is alert. She has normal reflexes.  Skin: Skin is warm and dry. No rash noted. No erythema. No pallor.  Psychiatric: She has  a normal mood and affect.          Assessment & Plan:

## 2012-05-20 NOTE — Assessment & Plan Note (Signed)
Stable control on 500 metformin bid Did gain wt  Rev low glycemic diet and exercise for wt loss-pt is motivated F/u 6 mo

## 2012-05-20 NOTE — Assessment & Plan Note (Signed)
bp in fair control at this time  No changes needed  Disc lifstyle change with low sodium diet and exercise   Labs reviewed F/u 6 mo

## 2012-11-08 ENCOUNTER — Inpatient Hospital Stay: Payer: Self-pay | Admitting: Internal Medicine

## 2012-11-08 ENCOUNTER — Ambulatory Visit: Payer: Self-pay | Admitting: Urology

## 2012-11-08 LAB — BASIC METABOLIC PANEL
Anion Gap: 9 (ref 7–16)
Chloride: 107 mmol/L (ref 98–107)
Co2: 22 mmol/L (ref 21–32)
EGFR (African American): 55 — ABNORMAL LOW
EGFR (Non-African Amer.): 48 — ABNORMAL LOW
Glucose: 172 mg/dL — ABNORMAL HIGH (ref 65–99)
Osmolality: 282 (ref 275–301)
Potassium: 4.1 mmol/L (ref 3.5–5.1)

## 2012-11-08 LAB — URINALYSIS, COMPLETE
Bilirubin,UR: NEGATIVE
Blood: NEGATIVE
Glucose,UR: 50 mg/dL (ref 0–75)
Squamous Epithelial: 1
WBC UR: 24 /HPF (ref 0–5)

## 2012-11-08 LAB — CBC
HCT: 42.1 % (ref 35.0–47.0)
HGB: 14.6 g/dL (ref 12.0–16.0)
MCH: 31.1 pg (ref 26.0–34.0)
MCHC: 34.7 g/dL (ref 32.0–36.0)
MCV: 90 fL (ref 80–100)
Platelet: 145 10*3/uL — ABNORMAL LOW (ref 150–440)
RDW: 13.2 % (ref 11.5–14.5)
WBC: 4.8 10*3/uL (ref 3.6–11.0)

## 2012-11-08 LAB — HEPATIC FUNCTION PANEL A (ARMC)
Alkaline Phosphatase: 85 U/L (ref 50–136)
Bilirubin,Total: 1.2 mg/dL — ABNORMAL HIGH (ref 0.2–1.0)
SGOT(AST): 27 U/L (ref 15–37)
SGPT (ALT): 27 U/L (ref 12–78)
Total Protein: 7.2 g/dL (ref 6.4–8.2)

## 2012-11-08 LAB — HEMOGLOBIN A1C: Hemoglobin A1C: 7.4 % — ABNORMAL HIGH (ref 4.2–6.3)

## 2012-11-09 LAB — CBC WITH DIFFERENTIAL/PLATELET
Basophil #: 0 10*3/uL (ref 0.0–0.1)
Basophil %: 0.2 %
Eosinophil %: 0 %
HGB: 12.8 g/dL (ref 12.0–16.0)
Lymphocyte %: 3.7 %
MCH: 30.9 pg (ref 26.0–34.0)
MCHC: 34.4 g/dL (ref 32.0–36.0)
Monocyte #: 0.5 x10 3/mm (ref 0.2–0.9)
Monocyte %: 2.9 %
Neutrophil %: 93.2 %
Platelet: 149 10*3/uL — ABNORMAL LOW (ref 150–440)
RBC: 4.12 10*6/uL (ref 3.80–5.20)
RDW: 13.4 % (ref 11.5–14.5)
WBC: 17.7 10*3/uL — ABNORMAL HIGH (ref 3.6–11.0)

## 2012-11-09 LAB — BASIC METABOLIC PANEL
Anion Gap: 5 — ABNORMAL LOW (ref 7–16)
BUN: 25 mg/dL — ABNORMAL HIGH (ref 7–18)
Chloride: 106 mmol/L (ref 98–107)
Co2: 27 mmol/L (ref 21–32)
Creatinine: 1.49 mg/dL — ABNORMAL HIGH (ref 0.60–1.30)
EGFR (African American): 39 — ABNORMAL LOW
EGFR (Non-African Amer.): 34 — ABNORMAL LOW
Glucose: 174 mg/dL — ABNORMAL HIGH (ref 65–99)
Potassium: 4.3 mmol/L (ref 3.5–5.1)
Sodium: 138 mmol/L (ref 136–145)

## 2012-11-10 LAB — BASIC METABOLIC PANEL
Anion Gap: 7 (ref 7–16)
Calcium, Total: 8.2 mg/dL — ABNORMAL LOW (ref 8.5–10.1)
Chloride: 108 mmol/L — ABNORMAL HIGH (ref 98–107)
Co2: 23 mmol/L (ref 21–32)
Creatinine: 1.55 mg/dL — ABNORMAL HIGH (ref 0.60–1.30)
Osmolality: 283 (ref 275–301)
Potassium: 4 mmol/L (ref 3.5–5.1)
Sodium: 138 mmol/L (ref 136–145)

## 2012-11-10 LAB — CBC WITH DIFFERENTIAL/PLATELET
Eosinophil #: 0 10*3/uL (ref 0.0–0.7)
Eosinophil %: 0.2 %
Lymphocyte #: 1.5 10*3/uL (ref 1.0–3.6)
Lymphocyte %: 7.7 %
MCH: 31 pg (ref 26.0–34.0)
MCV: 90 fL (ref 80–100)
Monocyte #: 0.7 x10 3/mm (ref 0.2–0.9)
Monocyte %: 3.6 %
Neutrophil #: 17.2 10*3/uL — ABNORMAL HIGH (ref 1.4–6.5)
Neutrophil %: 88.3 %
Platelet: 102 10*3/uL — ABNORMAL LOW (ref 150–440)
RDW: 13.9 % (ref 11.5–14.5)

## 2012-11-11 ENCOUNTER — Telehealth: Payer: Self-pay | Admitting: Family Medicine

## 2012-11-11 DIAGNOSIS — K219 Gastro-esophageal reflux disease without esophagitis: Secondary | ICD-10-CM

## 2012-11-11 DIAGNOSIS — E7800 Pure hypercholesterolemia, unspecified: Secondary | ICD-10-CM

## 2012-11-11 DIAGNOSIS — E119 Type 2 diabetes mellitus without complications: Secondary | ICD-10-CM

## 2012-11-11 DIAGNOSIS — I10 Essential (primary) hypertension: Secondary | ICD-10-CM

## 2012-11-11 LAB — BASIC METABOLIC PANEL
Anion Gap: 5 — ABNORMAL LOW (ref 7–16)
BUN: 19 mg/dL — ABNORMAL HIGH (ref 7–18)
Calcium, Total: 8.7 mg/dL (ref 8.5–10.1)
Chloride: 109 mmol/L — ABNORMAL HIGH (ref 98–107)
Co2: 26 mmol/L (ref 21–32)
Creatinine: 0.94 mg/dL (ref 0.60–1.30)
EGFR (Non-African Amer.): 59 — ABNORMAL LOW
Glucose: 128 mg/dL — ABNORMAL HIGH (ref 65–99)
Osmolality: 283 (ref 275–301)
Potassium: 3.7 mmol/L (ref 3.5–5.1)
Sodium: 140 mmol/L (ref 136–145)

## 2012-11-11 LAB — URINE CULTURE

## 2012-11-11 NOTE — Telephone Encounter (Signed)
Message copied by Judy Pimple on Mon Nov 11, 2012  7:43 PM ------      Message from: Alvina Chou      Created: Thu Nov 07, 2012  9:23 AM      Regarding: Lab orders for Tuesday, 8.19.14       Patient is scheduled for CPX labs, please order future labs, Thanks , Terri       ------

## 2012-11-12 ENCOUNTER — Other Ambulatory Visit: Payer: Medicare Other

## 2012-11-12 LAB — BASIC METABOLIC PANEL
Anion Gap: 5 — ABNORMAL LOW (ref 7–16)
BUN: 13 mg/dL (ref 7–18)
Calcium, Total: 9.1 mg/dL (ref 8.5–10.1)
Chloride: 111 mmol/L — ABNORMAL HIGH (ref 98–107)
Co2: 26 mmol/L (ref 21–32)
Creatinine: 0.79 mg/dL (ref 0.60–1.30)
EGFR (African American): >60
EGFR (Non-African Amer.): >60
Osmolality: 285 (ref 275–301)

## 2012-11-12 LAB — WBC: WBC: 10.5 10*3/uL (ref 3.6–11.0)

## 2012-11-13 LAB — CULTURE, BLOOD (SINGLE)

## 2012-11-15 ENCOUNTER — Emergency Department: Payer: Self-pay | Admitting: Emergency Medicine

## 2012-11-15 LAB — COMPREHENSIVE METABOLIC PANEL
Albumin: 3 g/dL — ABNORMAL LOW (ref 3.4–5.0)
Alkaline Phosphatase: 94 U/L (ref 50–136)
Anion Gap: 8 (ref 7–16)
BUN: 13 mg/dL (ref 7–18)
Chloride: 109 mmol/L — ABNORMAL HIGH (ref 98–107)
Co2: 24 mmol/L (ref 21–32)
EGFR (African American): >60
EGFR (Non-African Amer.): >60
Glucose: 161 mg/dL — ABNORMAL HIGH (ref 65–99)
Osmolality: 285 (ref 275–301)
Potassium: 3.8 mmol/L (ref 3.5–5.1)
Sodium: 141 mmol/L (ref 136–145)
Total Protein: 6.5 g/dL (ref 6.4–8.2)

## 2012-11-15 LAB — CBC
HCT: 36.2 % (ref 35.0–47.0)
HGB: 12.5 g/dL (ref 12.0–16.0)
MCHC: 34.5 g/dL (ref 32.0–36.0)
RBC: 4.09 10*6/uL (ref 3.80–5.20)

## 2012-11-15 LAB — TROPONIN I: Troponin-I: 0.02 ng/mL

## 2012-11-19 ENCOUNTER — Other Ambulatory Visit (INDEPENDENT_AMBULATORY_CARE_PROVIDER_SITE_OTHER): Payer: Medicare Other

## 2012-11-19 ENCOUNTER — Ambulatory Visit (INDEPENDENT_AMBULATORY_CARE_PROVIDER_SITE_OTHER): Payer: Medicare Other | Admitting: Family Medicine

## 2012-11-19 ENCOUNTER — Encounter: Payer: Self-pay | Admitting: Family Medicine

## 2012-11-19 VITALS — BP 124/64 | HR 85 | Temp 98.0°F | Ht 61.0 in | Wt 208.2 lb

## 2012-11-19 DIAGNOSIS — I10 Essential (primary) hypertension: Secondary | ICD-10-CM

## 2012-11-19 DIAGNOSIS — E119 Type 2 diabetes mellitus without complications: Secondary | ICD-10-CM

## 2012-11-19 DIAGNOSIS — Z0000 Encounter for general adult medical examination without abnormal findings: Secondary | ICD-10-CM

## 2012-11-19 DIAGNOSIS — E669 Obesity, unspecified: Secondary | ICD-10-CM

## 2012-11-19 DIAGNOSIS — E8779 Other fluid overload: Secondary | ICD-10-CM

## 2012-11-19 DIAGNOSIS — E7800 Pure hypercholesterolemia, unspecified: Secondary | ICD-10-CM

## 2012-11-19 DIAGNOSIS — I5032 Chronic diastolic (congestive) heart failure: Secondary | ICD-10-CM

## 2012-11-19 DIAGNOSIS — IMO0002 Reserved for concepts with insufficient information to code with codable children: Secondary | ICD-10-CM

## 2012-11-19 DIAGNOSIS — Z87442 Personal history of urinary calculi: Secondary | ICD-10-CM

## 2012-11-19 DIAGNOSIS — I519 Heart disease, unspecified: Secondary | ICD-10-CM

## 2012-11-19 LAB — LIPID PANEL
Cholesterol: 120 mg/dL (ref 0–200)
LDL Cholesterol: 47 mg/dL (ref 0–99)
Triglycerides: 174 mg/dL — ABNORMAL HIGH (ref 0.0–149.0)
VLDL: 34.8 mg/dL (ref 0.0–40.0)

## 2012-11-19 LAB — CBC WITH DIFFERENTIAL/PLATELET
Basophils Relative: 0.3 % (ref 0.0–3.0)
Eosinophils Absolute: 0.2 10*3/uL (ref 0.0–0.7)
Hemoglobin: 12.7 g/dL (ref 12.0–15.0)
Lymphs Abs: 1.8 10*3/uL (ref 0.7–4.0)
MCHC: 33.5 g/dL (ref 30.0–36.0)
MCV: 90.3 fl (ref 78.0–100.0)
Monocytes Absolute: 0.3 10*3/uL (ref 0.1–1.0)
Neutro Abs: 5.3 10*3/uL (ref 1.4–7.7)
RBC: 4.19 Mil/uL (ref 3.87–5.11)

## 2012-11-19 LAB — COMPREHENSIVE METABOLIC PANEL
AST: 19 U/L (ref 0–37)
Alkaline Phosphatase: 63 U/L (ref 39–117)
BUN: 18 mg/dL (ref 6–23)
Creatinine, Ser: 0.8 mg/dL (ref 0.4–1.2)

## 2012-11-19 NOTE — Progress Notes (Signed)
Subjective:    Patient ID: Gabriella Howe, female    DOB: July 09, 1934, 78 y.o.   MRN: [id number]  HPI I have personally reviewed the Medicare Annual Wellness questionnaire and have noted 1. The patient's medical and social history 2. Their use of alcohol, tobacco or illicit drugs 3. Their current medications and supplements 4. The patient's functional ability including ADL's, fall risks, home safety risks and hearing or visual             impairment. 5. Diet and physical activities 6. Evidence for depression or mood disorders  The patients weight, height, BMI have been recorded in the chart and visual acuity is per eye clinic.  I have made referrals, counseling and provided education to the patient based review of the above and I have provided the pt with a written personalized care plan for preventive services.  See scanned forms.  Routine anticipatory guidance given to patient.  See health maintenance. Flu vaccine - last fall  Shingles declines vaccine (insurance does not pay for it )- she has had shingles before  PNA- was 05 vaccine  Tetanus- allergic to vaccine  Colon 3/11- 5 year recall (was 7 year originally)  Breast cancer screening 9/13 - was stable - will make own appt  No lumps on self exam Advance directive- has a living will set up  Cognitive function addressed- see scanned forms- and if abnormal then additional documentation follows.  Her memory is not as good as it used to be -just for names - otherwise no problems She still works (for [employer])  Falls  -none in the past year   Mood - is good overall - has a positive outlook (has frustration when computer crashes) - working at home  Recent hosp for kidney stone/ stent and pyelonephritis at [facility]- has urology f/u upcoming  Feels better- just tired   Diabetes Home sugar results  DM diet - is fairly good  Exercise -- not enough overall - is going to really work on that (back is her biggest problem)   Symptoms-none  A1C last  Lab Results  Component Value Date   HGBA1C 6.7* 05/14/2012  up from 6.6   No problems with medications  Renal protection-on ace  Last eye exam 8/13- has it planned on Thursday   bp is stable today  No cp or palpitations or headaches or edema  No side effects to medicines  BP Readings from Last 3 Encounters:  11/19/12 124/64  05/20/12 136/68  11/17/11 118/64      Hyperlipidemia zocor and diet Lab Results  Component Value Date   CHOL 129 05/14/2012   CHOL 128 11/14/2011   CHOL 118 05/15/2011   Lab Results  Component Value Date   HDL 54.80 05/14/2012   HDL 40.98 11/14/2011   HDL 11.91 05/15/2011   Lab Results  Component Value Date   LDLCALC 48 05/14/2012   LDLCALC 53 11/14/2011   LDLCALC 46 05/15/2011   Lab Results  Component Value Date   TRIG 130.0 05/14/2012   TRIG 93.0 11/14/2011   TRIG 97.0 05/15/2011   Lab Results  Component Value Date   CHOLHDL 2 05/14/2012   CHOLHDL 2 11/14/2011   CHOLHDL 2 05/15/2011   No results found for this basename: LDLDIRECT     very well controlled   PMH and SH reviewed  Meds, vitals, and allergies reviewed.   ROS: See HPI.  Otherwise negative.     Patient Active Problem List   Diagnosis  Date Noted  . Encounter for Medicare annual wellness exam 11/19/2012  . History of kidney stones 11/19/2012  . Abnormal mammogram 12/21/2011  . Kidney stone 06/07/2011  . Other screening mammogram 08/10/2010  . DIASTOLIC HEART FAILURE, CHRONIC 07/29/2009  . DYSPNEA 07/23/2009  . HYPERCHOLESTEROLEMIA, PURE 09/13/2006  . FLUID RETENTION 09/10/2006  . OBESITY 09/10/2006  . CARPAL TUNNEL SYNDROME 09/10/2006  . HYPERTENSION 09/10/2006  . DIASTOLIC DYSFUNCTION 09/10/2006  . REACTIVE AIRWAY DISEASE 09/10/2006  . GERD 09/10/2006  . ROSACEA 09/10/2006  . DEGENERATIVE DISC DISEASE 09/10/2006  . SLEEP APNEA 09/10/2006  . COLONIC POLYPS, HX OF 09/10/2006  . DIABETES MELLITUS, TYPE II 09/06/2006   Past Medical History   Diagnosis Date  . CHF (congestive heart failure)     diastolic  . Heart disease, unspecified   . Hypertension   . Hyperlipidemia   . Chest pain, unspecified   . Shortness of breath   . Fluid overload   . GERD (gastroesophageal reflux disease)   . Obesity, unspecified   . Diabetes mellitus     type II  . Unspecified asthma(493.90)   . Benign neoplasm of colon   . Unspecified sleep apnea   . Carpal tunnel syndrome   . Rosacea   . Degeneration of intervertebral disc, site unspecified   . Personal history of colonic polyps   . Encounter for long-term (current) use of other medications   . Kidney stone 3/12    hosp/ with stent   Past Surgical History  Procedure Laterality Date  . Abdominal hysterectomy      partial, fibroids  . Esophagogastroduodenoscopy  2000  . Umbilical hernia repair  01/2004  . Ureteral stent placement  2/13    for L sided stone/ ecoli sepsis   History  Substance Use Topics  . Smoking status: Never Smoker   . Smokeless tobacco: Not on file  . Alcohol Use: No   No family history on file. Allergies  Allergen Reactions  . Rofecoxib     REACTION: reaction not known  . Tetanus-Diphtheria Toxoids Td     REACTION: sick with fever  . Antihistamines, Chlorpheniramine-Type Palpitations  . Sulfa Antibiotics Rash   Current Outpatient Prescriptions on File Prior to Visit  Medication Sig Dispense Refill  . Ascorbic Acid (VITAMIN C) 500 MG tablet Take 500 mg by mouth daily.        . Aspirin-Caffeine (BC FAST PAIN RELIEF PO) Take by mouth as directed.        . benazepril (LOTENSIN) 20 MG tablet Take 1 tablet (20 mg total) by mouth daily.  90 tablet  3  . CALCIUM-VITAMIN D PO Take 1 tablet by mouth 2 (two) times daily.       . furosemide (LASIX) 40 MG tablet Take 0.5 tablets (20 mg total) by mouth daily.  45 tablet  3  . Magnesium 250 MG TABS Take by mouth daily.      . metFORMIN (GLUCOPHAGE) 1000 MG tablet Take 0.5 tablets (500 mg total) by mouth 2 (two)  times daily with a meal.  90 tablet  3  . metroNIDAZOLE (METROGEL) 0.75 % gel Apply topically daily. To affected area on face.Rosacea.  45 g  3  . Multiple Vitamin (MULTIVITAMIN) capsule Take 1 capsule by mouth daily.        . Omega-3 Fatty Acids (FISH OIL PO) Take 1 capsule by mouth daily.       Marland Kitchen omeprazole (PRILOSEC) 20 MG capsule Take 1 capsule (20 mg total)  by mouth daily.  90 capsule  3  . Pseudoephedrine HCl (SINUS & ALLERGY 12 HOUR PO) Take by mouth as directed.        . simvastatin (ZOCOR) 40 MG tablet Take 1 tablet (40 mg total) by mouth at bedtime.  90 tablet  3  . vitamin E 400 UNIT capsule Take 400 Units by mouth daily.         No current facility-administered medications on file prior to visit.    Review of Systems    Review of Systems  Constitutional: Negative for fever, appetite change,  and unexpected weight change. pos for fatigue post hospital that is improving  Eyes: Negative for pain and visual disturbance.  Respiratory: Negative for cough and shortness of breath.   Cardiovascular: Negative for cp or palpitations    Gastrointestinal: Negative for nausea, diarrhea and constipation.  Genitourinary: Negative for urgency and frequency. neg for hematuria or flank pain (resolved) Skin: Negative for pallor or rash   Neurological: Negative for weakness, light-headedness, numbness and headaches.  Hematological: Negative for adenopathy. Does not bruise/bleed easily.  Psychiatric/Behavioral: Negative for dysphoric mood. The patient is not nervous/anxious.      Objective:   Physical Exam  Constitutional: She appears well-developed and well-nourished. No distress.  obese and well appearing   HENT:  Head: Normocephalic and atraumatic.  Right Ear: External ear normal.  Left Ear: External ear normal.  Nose: Nose normal.  Mouth/Throat: Oropharynx is clear and moist.  Eyes: Conjunctivae and EOM are normal. Pupils are equal, round, and reactive to light. No scleral icterus.   Neck: Normal range of motion. Neck supple. No JVD present. Carotid bruit is not present. No thyromegaly present.  Cardiovascular: Normal rate, regular rhythm, normal heart sounds and intact distal pulses.  Exam reveals no gallop.   Pulmonary/Chest: Effort normal and breath sounds normal. No respiratory distress. She has no wheezes. She has no rales.  Abdominal: Soft. Bowel sounds are normal. She exhibits no distension, no abdominal bruit and no mass. There is no tenderness.  Genitourinary: No breast swelling, tenderness, discharge or bleeding.  Breast exam: No mass, nodules, thickening, tenderness, bulging, retraction, inflamation, nipple discharge or skin changes noted.  No axillary or clavicular LA.  Chaperoned exam.    Musculoskeletal: Normal range of motion. She exhibits no edema and no tenderness.  Lymphadenopathy:    She has no cervical adenopathy.  Neurological: She is alert. She has normal reflexes. No cranial nerve deficit. She exhibits normal muscle tone. Coordination normal.  Skin: Skin is warm and dry. No rash noted. No erythema. No pallor.  SKs noted with lentigos  Psychiatric: She has a normal mood and affect.          Assessment & Plan:

## 2012-11-19 NOTE — Assessment & Plan Note (Signed)
Rev recent hosp visit  Has stent and doing much better  Has f/u with urologist planned

## 2012-11-19 NOTE — Assessment & Plan Note (Signed)
Overall stable Lab Results  Component Value Date   HGBA1C 7.4* 11/19/2012    opth exam planned next week Disc lifestyle change and goals for tx F/u 6 mo

## 2012-11-19 NOTE — Assessment & Plan Note (Signed)
More problems lately affecting her mobility and ability to exercise Offered to refer her for orthopedic eval - she will wait a bit and then call back

## 2012-11-19 NOTE — Patient Instructions (Addendum)
Don't forget to get your flu shot in the fall  Take care of yourself Don't forget to schedule your mammogram for next month  Stay as active as you can be and try to loose weight  Follow up with me in 6 months with labs prior  Call us when you are ready to get an orthopedic referral for your back

## 2012-11-19 NOTE — Assessment & Plan Note (Signed)
Disc goals for lipids and reasons to control them Stable on zocor and diet  Rev labs with pt Rev low sat fat diet in detail

## 2012-11-19 NOTE — Assessment & Plan Note (Signed)
Discussed how this problem influences overall health and the risks it imposes  Reviewed plan for weight loss with lower calorie diet (via better food choices and also portion control or program like weight watchers) and exercise building up to or more than 30 minutes 5 days per week including some aerobic activity    

## 2012-11-19 NOTE — Assessment & Plan Note (Signed)
Reviewed health habits including diet and exercise and skin cancer prevention Also reviewed health mt list, fam hx and immunizations  See HPI Enc to work on living will Wellness labs reviewed

## 2012-11-19 NOTE — Assessment & Plan Note (Signed)
bp in fair control at this time  No changes needed  Disc lifstyle change with low sodium diet and exercise  Labs reviewed  

## 2012-11-28 DIAGNOSIS — R350 Frequency of micturition: Secondary | ICD-10-CM | POA: Insufficient documentation

## 2012-12-04 ENCOUNTER — Other Ambulatory Visit: Payer: Self-pay | Admitting: *Deleted

## 2012-12-05 MED ORDER — GLUCOSE BLOOD VI STRP: ORAL_STRIP | Status: DC

## 2012-12-05 MED ORDER — MICROLET LANCETS MISC: Status: DC

## 2012-12-09 ENCOUNTER — Ambulatory Visit: Payer: Self-pay | Admitting: Urology

## 2012-12-12 ENCOUNTER — Ambulatory Visit: Payer: Self-pay | Admitting: Urology

## 2012-12-16 ENCOUNTER — Ambulatory Visit: Payer: Self-pay | Admitting: Urology

## 2012-12-19 ENCOUNTER — Ambulatory Visit: Payer: Self-pay | Admitting: Urology

## 2012-12-25 ENCOUNTER — Ambulatory Visit: Payer: Self-pay | Admitting: Urology

## 2013-05-13 ENCOUNTER — Telehealth: Payer: Self-pay | Admitting: Family Medicine

## 2013-05-13 DIAGNOSIS — E119 Type 2 diabetes mellitus without complications: Secondary | ICD-10-CM

## 2013-05-13 NOTE — Telephone Encounter (Signed)
Message copied by Abner Greenspan on Tue May 13, 2013  4:15 PM ------      Message from: Ellamae Sia      Created: Fri May 09, 2013  4:43 PM      Regarding: Lab orders for Friday, 2.20.15       Lab orders for a 6 month f/u ------

## 2013-05-16 ENCOUNTER — Other Ambulatory Visit: Payer: Medicare Other

## 2013-05-19 ENCOUNTER — Other Ambulatory Visit (INDEPENDENT_AMBULATORY_CARE_PROVIDER_SITE_OTHER): Payer: Medicare Other

## 2013-05-19 DIAGNOSIS — E119 Type 2 diabetes mellitus without complications: Secondary | ICD-10-CM

## 2013-05-19 LAB — HEMOGLOBIN A1C: HEMOGLOBIN A1C: 7.6 % — AB (ref 4.6–6.5)

## 2013-05-23 ENCOUNTER — Ambulatory Visit: Payer: Medicare Other | Admitting: Family Medicine

## 2013-05-26 ENCOUNTER — Encounter: Payer: Self-pay | Admitting: Family Medicine

## 2013-05-26 ENCOUNTER — Ambulatory Visit (INDEPENDENT_AMBULATORY_CARE_PROVIDER_SITE_OTHER): Payer: Medicare Other | Admitting: Family Medicine

## 2013-05-26 VITALS — BP 118/78 | HR 70 | Temp 98.1°F | Ht 61.0 in | Wt 208.0 lb

## 2013-05-26 DIAGNOSIS — I10 Essential (primary) hypertension: Secondary | ICD-10-CM

## 2013-05-26 DIAGNOSIS — IMO0002 Reserved for concepts with insufficient information to code with codable children: Secondary | ICD-10-CM

## 2013-05-26 DIAGNOSIS — E119 Type 2 diabetes mellitus without complications: Secondary | ICD-10-CM

## 2013-05-26 DIAGNOSIS — E669 Obesity, unspecified: Secondary | ICD-10-CM

## 2013-05-26 MED ORDER — FUROSEMIDE 40 MG PO TABS: 20.0000 mg | ORAL_TABLET | Freq: Every day | ORAL | Status: DC

## 2013-05-26 MED ORDER — BENAZEPRIL HCL 20 MG PO TABS: 20.0000 mg | ORAL_TABLET | Freq: Every day | ORAL | Status: DC

## 2013-05-26 MED ORDER — SIMVASTATIN 40 MG PO TABS: 40.0000 mg | ORAL_TABLET | Freq: Every day | ORAL | Status: DC

## 2013-05-26 MED ORDER — OMEPRAZOLE 20 MG PO CPDR: 20.0000 mg | DELAYED_RELEASE_CAPSULE | Freq: Every day | ORAL | Status: DC

## 2013-05-26 MED ORDER — METFORMIN HCL 1000 MG PO TABS: 1000.0000 mg | ORAL_TABLET | Freq: Two times a day (BID) | ORAL | Status: DC

## 2013-05-26 NOTE — Assessment & Plan Note (Signed)
Symptoms have become worse and affect gait Ref to orthopedics for eval

## 2013-05-26 NOTE — Progress Notes (Signed)
Pre visit review using our clinic review tool, if applicable. No additional management support is needed unless otherwise documented below in the visit note. 

## 2013-05-26 NOTE — Progress Notes (Signed)
Subjective:    Patient ID: Gabriella Howe, female    DOB: 1934/05/02, 79 y.o.   MRN: [id number]  HPI Here for f/u of chronic medical problems  Is feeling ok overall   Has chronic back and leg pain  Hx of deg disc dz  Now ref pain to R leg  She does not have orthopedic doctor  She cannot exercise / walk or stand anymore   Wt is stable with bmi of 39- obese   bp is stable today  No cp or palpitations or headaches or edema  No side effects to medicines  BP Readings from Last 3 Encounters:  05/26/13 118/78  11/19/12 124/64  05/20/12 136/68     Diabetes Home sugar results - 2 hours after meals 140-150 and in am she is around 105-110  DM diet - she follows DM diet except for too much bread  Exercise -none  Symptoms-none  A1C last  Lab Results  Component Value Date   HGBA1C 7.6* 05/19/2013  this is up from 7.4  No problems with medications -- taking metformin - is at 500 , can go up to 1000  In the past loosing wt helps  Renal protection on ace  Last eye exam - opthy was in aug  Did get flu vaccine in the fall    Patient Active Problem List   Diagnosis Date Noted  . Encounter for Medicare annual wellness exam 11/19/2012  . History of kidney stones 11/19/2012  . Abnormal mammogram 12/21/2011  . Kidney stone 06/07/2011  . Other screening mammogram 08/10/2010  . DIASTOLIC HEART FAILURE, CHRONIC 07/29/2009  . DYSPNEA 07/23/2009  . HYPERCHOLESTEROLEMIA, PURE 09/13/2006  . FLUID RETENTION 09/10/2006  . OBESITY 09/10/2006  . CARPAL TUNNEL SYNDROME 09/10/2006  . HYPERTENSION 09/10/2006  . DIASTOLIC DYSFUNCTION 27/74/1287  . REACTIVE AIRWAY DISEASE 09/10/2006  . GERD 09/10/2006  . ROSACEA 09/10/2006  . DEGENERATIVE DISC DISEASE 09/10/2006  . SLEEP APNEA 09/10/2006  . COLONIC POLYPS, HX OF 09/10/2006  . DIABETES MELLITUS, TYPE II 09/06/2006   Past Medical History  Diagnosis Date  . CHF (congestive heart failure)     diastolic  . Heart disease, unspecified   .  Hypertension   . Hyperlipidemia   . Chest pain, unspecified   . Shortness of breath   . Fluid overload   . GERD (gastroesophageal reflux disease)   . Obesity, unspecified   . Diabetes mellitus     type II  . Unspecified asthma(493.90)   . Benign neoplasm of colon   . Unspecified sleep apnea   . Carpal tunnel syndrome   . Rosacea   . Degeneration of intervertebral disc, site unspecified   . Personal history of colonic polyps   . Encounter for long-term (current) use of other medications   . Kidney stone 3/12    hosp/ with stent   Past Surgical History  Procedure Laterality Date  . Abdominal hysterectomy      partial, fibroids  . Esophagogastroduodenoscopy  2000  . Umbilical hernia repair  01/2004  . Ureteral stent placement  2/13    for L sided stone/ ecoli sepsis   History  Substance Use Topics  . Smoking status: Never Smoker   . Smokeless tobacco: Not on file  . Alcohol Use: No   No family history on file. Allergies  Allergen Reactions  . Rofecoxib     REACTION: reaction not known  . Tetanus-Diphtheria Toxoids Td     REACTION: sick with fever  .  Antihistamines, Chlorpheniramine-Type Palpitations  . Sulfa Antibiotics Rash   Current Outpatient Prescriptions on File Prior to Visit  Medication Sig Dispense Refill  . Ascorbic Acid (VITAMIN C) 500 MG tablet Take 500 mg by mouth daily.        . Aspirin-Caffeine (BC FAST PAIN RELIEF PO) Take by mouth as directed.        Marland Kitchen CALCIUM-VITAMIN D PO Take 1 tablet by mouth 2 (two) times daily.       Marland Kitchen glucose blood (BAYER CONTOUR TEST) test strip Use to check blood sugar once daily as directed for DM 250.0  100 each  1  . Magnesium 250 MG TABS Take by mouth daily.      . metroNIDAZOLE (METROGEL) 0.75 % gel Apply topically daily. To affected area on face.Rosacea.  45 g  3  . MICROLET LANCETS MISC Check blood sugar once daily as directed for DM 250.0  100 each  1  . Multiple Vitamin (MULTIVITAMIN) capsule Take 1 capsule by mouth  daily.        . Omega-3 Fatty Acids (FISH OIL PO) Take 1 capsule by mouth daily.       . Pseudoephedrine HCl (SINUS & ALLERGY 12 HOUR PO) Take by mouth as directed.        . vitamin E 400 UNIT capsule Take 400 Units by mouth daily.         No current facility-administered medications on file prior to visit.     Review of Systems Review of Systems  Constitutional: Negative for fever, appetite change, fatigue and unexpected weight change.  Eyes: Negative for pain and visual disturbance.  Respiratory: Negative for cough and shortness of breath.   Cardiovascular: Negative for cp or palpitations    Gastrointestinal: Negative for nausea, diarrhea and constipation.  Genitourinary: Negative for urgency and frequency.  Skin: Negative for pallor or rash  MSK pos for low back and leg pain   Neurological: Negative for weakness, light-headedness, numbness and headaches.  Hematological: Negative for adenopathy. Does not bruise/bleed easily.  Psychiatric/Behavioral: Negative for dysphoric mood. The patient is not nervous/anxious.         Objective:   Physical Exam  Constitutional: She appears well-developed and well-nourished. No distress.  obese and well appearing   HENT:  Head: Normocephalic and atraumatic.  Right Ear: External ear normal.  Left Ear: External ear normal.  Nose: Nose normal.  Mouth/Throat: Oropharynx is clear and moist.  Eyes: Conjunctivae and EOM are normal. Pupils are equal, round, and reactive to light. Right eye exhibits no discharge. Left eye exhibits no discharge. No scleral icterus.  Neck: Normal range of motion. Neck supple. No JVD present. Carotid bruit is not present. No thyromegaly present.  Cardiovascular: Normal rate, regular rhythm, normal heart sounds and intact distal pulses.  Exam reveals no gallop.   Pulmonary/Chest: Effort normal and breath sounds normal. No respiratory distress. She has no wheezes. She has no rales.  Abdominal: Soft. Bowel sounds are  normal. She exhibits no distension, no abdominal bruit and no mass. There is no tenderness.  Musculoskeletal: She exhibits no edema and no tenderness.  Lymphadenopathy:    She has no cervical adenopathy.  Neurological: She is alert. She has normal reflexes. No cranial nerve deficit. She exhibits normal muscle tone. Coordination normal.  Skin: Skin is warm and dry. No rash noted. No erythema. No pallor.  Psychiatric: She has a normal mood and affect.          Assessment & Plan:

## 2013-05-26 NOTE — Assessment & Plan Note (Signed)
BP: 118/78 mmHg  bp in fair control at this time  No changes needed Disc lifstyle change with low sodium diet and exercise  Lab rev F/u 3 mo

## 2013-05-26 NOTE — Assessment & Plan Note (Signed)
Discussed how this problem influences overall health and the risks it imposes  Reviewed plan for weight loss with lower calorie diet (via better food choices and also portion control or program like weight watchers) and exercise building up to or more than 30 minutes 5 days per week including some aerobic activity   Suggested chair exercise program at home

## 2013-05-26 NOTE — Patient Instructions (Addendum)
Increase metformin to 1000 mg twice daily  Eat a diabetic diet and work on weight loss  Stay as active as you can be  Stop up front for your orthopedic referral  Follow up in 3 months with labs prior

## 2013-05-26 NOTE — Assessment & Plan Note (Signed)
Lab Results  Component Value Date   HGBA1C 7.6* 05/19/2013   Inc metformin to 1000 mg bid  Low glycemic diet reviewed  Exercise disc -as tol/ chair exercise F/u 3 mo with lab prior

## 2013-05-27 ENCOUNTER — Telehealth: Payer: Self-pay | Admitting: Family Medicine

## 2013-05-27 NOTE — Telephone Encounter (Signed)
Relevant patient education assigned to patient using Emmi. ° °

## 2013-05-28 ENCOUNTER — Telehealth: Payer: Self-pay

## 2013-05-28 NOTE — Telephone Encounter (Signed)
Relevant patient education assigned to patient using Emmi. ° °

## 2013-06-10 ENCOUNTER — Ambulatory Visit: Payer: Self-pay | Admitting: Orthopedic Surgery

## 2013-07-15 ENCOUNTER — Ambulatory Visit: Payer: Self-pay | Admitting: Ophthalmology

## 2013-07-15 LAB — POTASSIUM: POTASSIUM: 4.4 mmol/L (ref 3.5–5.1)

## 2013-07-22 ENCOUNTER — Ambulatory Visit: Payer: Self-pay | Admitting: Ophthalmology

## 2013-08-05 ENCOUNTER — Ambulatory Visit: Payer: Self-pay | Admitting: Ophthalmology

## 2013-08-19 ENCOUNTER — Other Ambulatory Visit (INDEPENDENT_AMBULATORY_CARE_PROVIDER_SITE_OTHER): Payer: Medicare Other

## 2013-08-19 DIAGNOSIS — E119 Type 2 diabetes mellitus without complications: Secondary | ICD-10-CM

## 2013-08-19 LAB — HEMOGLOBIN A1C: Hgb A1c MFr Bld: 7.3 % — ABNORMAL HIGH (ref 4.6–6.5)

## 2013-08-26 ENCOUNTER — Ambulatory Visit: Payer: Medicare Other | Admitting: Family Medicine

## 2013-08-26 ENCOUNTER — Encounter: Payer: Self-pay | Admitting: Family Medicine

## 2013-08-26 ENCOUNTER — Ambulatory Visit (INDEPENDENT_AMBULATORY_CARE_PROVIDER_SITE_OTHER): Payer: Medicare Other | Admitting: Family Medicine

## 2013-08-26 VITALS — BP 118/70 | HR 79 | Temp 97.8°F | Ht 61.0 in | Wt 209.5 lb

## 2013-08-26 DIAGNOSIS — E119 Type 2 diabetes mellitus without complications: Secondary | ICD-10-CM

## 2013-08-26 DIAGNOSIS — E669 Obesity, unspecified: Secondary | ICD-10-CM

## 2013-08-26 NOTE — Progress Notes (Signed)
Subjective:    Patient ID: Gabriella Howe, female    DOB: July 09, 1934, 79 y.o.   MRN: [id number]  HPI  Here for f/u of DM  Diabetes Home sugar results - max 160-170 after meal/ in am usually 100-110  DM diet - she has changed the way she eats - but still too much junk food (occ sweets)  Exercise - sometimes tries chair exercise programs/ she can walk on treadmill if holding on  Symptoms- none  A1C last  Lab Results  Component Value Date   HGBA1C 7.3* 08/19/2013  down from 7.6  No problems with medications - inc her metformin to 1000 mg bid (no side eff)  Renal protection ace  Last eye exam 8/14 and then had cataract surgery on both eyes   Patient Active Problem List   Diagnosis Date Noted  . Encounter for Medicare annual wellness exam 11/19/2012  . History of kidney stones 11/19/2012  . Abnormal mammogram 12/21/2011  . Kidney stone 06/07/2011  . Other screening mammogram 08/10/2010  . DIASTOLIC HEART FAILURE, CHRONIC 07/29/2009  . DYSPNEA 07/23/2009  . HYPERCHOLESTEROLEMIA, PURE 09/13/2006  . FLUID RETENTION 09/10/2006  . OBESITY 09/10/2006  . CARPAL TUNNEL SYNDROME 09/10/2006  . HYPERTENSION 09/10/2006  . DIASTOLIC DYSFUNCTION 27/78/2423  . REACTIVE AIRWAY DISEASE 09/10/2006  . GERD 09/10/2006  . ROSACEA 09/10/2006  . DEGENERATIVE DISC DISEASE 09/10/2006  . SLEEP APNEA 09/10/2006  . COLONIC POLYPS, HX OF 09/10/2006  . DIABETES MELLITUS, TYPE II 09/06/2006   Past Medical History  Diagnosis Date  . CHF (congestive heart failure)     diastolic  . Heart disease, unspecified   . Hypertension   . Hyperlipidemia   . Chest pain, unspecified   . Shortness of breath   . Fluid overload   . GERD (gastroesophageal reflux disease)   . Obesity, unspecified   . Diabetes mellitus     type II  . Unspecified asthma(493.90)   . Benign neoplasm of colon   . Unspecified sleep apnea   . Carpal tunnel syndrome   . Rosacea   . Degeneration of intervertebral disc, site  unspecified   . Personal history of colonic polyps   . Encounter for long-term (current) use of other medications   . Kidney stone 3/12    hosp/ with stent   Past Surgical History  Procedure Laterality Date  . Abdominal hysterectomy      partial, fibroids  . Esophagogastroduodenoscopy  2000  . Umbilical hernia repair  01/2004  . Ureteral stent placement  2/13    for L sided stone/ ecoli sepsis   History  Substance Use Topics  . Smoking status: Never Smoker   . Smokeless tobacco: Not on file  . Alcohol Use: No   No family history on file. Allergies  Allergen Reactions  . Rofecoxib     REACTION: reaction not known  . Tetanus-Diphtheria Toxoids Td     REACTION: sick with fever  . Antihistamines, Chlorpheniramine-Type Palpitations  . Sulfa Antibiotics Rash   Current Outpatient Prescriptions on File Prior to Visit  Medication Sig Dispense Refill  . Ascorbic Acid (VITAMIN C) 500 MG tablet Take 500 mg by mouth daily.        . Aspirin-Caffeine (BC FAST PAIN RELIEF PO) Take by mouth as directed.        . benazepril (LOTENSIN) 20 MG tablet Take 1 tablet (20 mg total) by mouth daily.  90 tablet  3  . CALCIUM-VITAMIN D PO Take 1 tablet  by mouth 2 (two) times daily.       . furosemide (LASIX) 40 MG tablet Take 0.5 tablets (20 mg total) by mouth daily.  45 tablet  3  . glucose blood (BAYER CONTOUR TEST) test strip Use to check blood sugar once daily as directed for DM 250.0  100 each  1  . Magnesium 250 MG TABS Take by mouth daily.      . metFORMIN (GLUCOPHAGE) 1000 MG tablet Take 1 tablet (1,000 mg total) by mouth 2 (two) times daily with a meal.  90 tablet  3  . metroNIDAZOLE (METROGEL) 0.75 % gel Apply topically daily. To affected area on face.Rosacea.  45 g  3  . MICROLET LANCETS MISC Check blood sugar once daily as directed for DM 250.0  100 each  1  . Multiple Vitamin (MULTIVITAMIN) capsule Take 1 capsule by mouth daily.        . Omega-3 Fatty Acids (FISH OIL PO) Take 1 capsule by  mouth daily.       Marland Kitchen omeprazole (PRILOSEC) 20 MG capsule Take 1 capsule (20 mg total) by mouth daily.  90 capsule  3  . Pseudoephedrine HCl (SINUS & ALLERGY 12 HOUR PO) Take by mouth as directed.        . simvastatin (ZOCOR) 40 MG tablet Take 1 tablet (40 mg total) by mouth at bedtime.  90 tablet  3  . vitamin E 400 UNIT capsule Take 400 Units by mouth daily.         No current facility-administered medications on file prior to visit.     Review of Systems Review of Systems  Constitutional: Negative for fever, appetite change, fatigue and unexpected weight change.  Eyes: Negative for pain and visual disturbance. (is getting her glasses adj currently) Respiratory: Negative for cough and shortness of breath.   Cardiovascular: Negative for cp or palpitations    Gastrointestinal: Negative for nausea, diarrhea and constipation.  Genitourinary: Negative for urgency and frequency.  Skin: Negative for pallor or rash   Neurological: Negative for weakness, light-headedness, numbness and headaches.  Hematological: Negative for adenopathy. Does not bruise/bleed easily.  Psychiatric/Behavioral: Negative for dysphoric mood. The patient is not nervous/anxious.         Objective:   Physical Exam  Constitutional: She appears well-developed and well-nourished. No distress.  obese and well appearing   HENT:  Head: Normocephalic and atraumatic.  Mouth/Throat: Oropharynx is clear and moist.  Eyes: Conjunctivae and EOM are normal. Pupils are equal, round, and reactive to light. No scleral icterus.  Neck: Normal range of motion. Neck supple. No JVD present. Carotid bruit is not present. No thyromegaly present.  Cardiovascular: Normal rate and regular rhythm.   Pulmonary/Chest: Effort normal and breath sounds normal. No respiratory distress. She has no rales.  Musculoskeletal: She exhibits no edema.  Lymphadenopathy:    She has no cervical adenopathy.  Neurological: She is alert. She has normal  reflexes. No cranial nerve deficit. She exhibits normal muscle tone. Coordination normal.  Skin: Skin is warm and dry. No rash noted. No pallor.  Psychiatric: She has a normal mood and affect.          Assessment & Plan:

## 2013-08-26 NOTE — Progress Notes (Signed)
Pre visit review using our clinic review tool, if applicable. No additional management support is needed unless otherwise documented below in the visit note. 

## 2013-08-26 NOTE — Patient Instructions (Signed)
Take care of yourself  Your A1c is improved but not at goal  To get to goal- please work in healthy diet and exercise and weight loss  Please eliminate sweets except for special occasions  Work towards exercise 5 days per week  Follow up in about 3 months for annual exam with labs prior

## 2013-08-27 NOTE — Assessment & Plan Note (Signed)
Lab Results  Component Value Date   HGBA1C 7.3* 08/19/2013    This is imp with inc metformin but not at goal  Will work hard on wt loss and lifestyle change (with low glycemic diet and exercise as tolerated) F/u 3 mo with lab prior

## 2013-08-27 NOTE — Assessment & Plan Note (Signed)
Discussed how this problem influences overall health and the risks it imposes  Reviewed plan for weight loss with lower calorie diet (via better food choices and also portion control or program like weight watchers) and exercise building up to or more than 30 minutes 5 days per week including some aerobic activity   Pt is more serious about trying this time

## 2013-09-15 ENCOUNTER — Other Ambulatory Visit: Payer: Self-pay | Admitting: *Deleted

## 2013-09-15 MED ORDER — METFORMIN HCL 1000 MG PO TABS: 1000.0000 mg | ORAL_TABLET | Freq: Two times a day (BID) | ORAL | Status: DC

## 2013-09-15 NOTE — Telephone Encounter (Signed)
Pt needed refill of metformin, we sent in #90 with 3 refills back in March but it should have been #180 because the pt takes it BID. Rx changed and [facility] notified to fill the new Rx for #180, and cancel the Rx for March.  Left voicemail letting pt know new Rx was sent to [facility]

## 2013-11-13 ENCOUNTER — Telehealth: Payer: Self-pay | Admitting: Family Medicine

## 2013-11-13 DIAGNOSIS — E7800 Pure hypercholesterolemia, unspecified: Secondary | ICD-10-CM

## 2013-11-13 DIAGNOSIS — I10 Essential (primary) hypertension: Secondary | ICD-10-CM

## 2013-11-13 NOTE — Telephone Encounter (Signed)
Message copied by Abner Greenspan on Thu Nov 13, 2013  4:09 PM ------      Message from: Ellamae Sia      Created: Tue Nov 11, 2013  4:23 PM      Regarding: Lab orders for Friday, 8.28.15       Has some labs ordered, but coming in for a cpx. Just checking to see if you wanted to add anything. ------

## 2013-11-21 ENCOUNTER — Other Ambulatory Visit (INDEPENDENT_AMBULATORY_CARE_PROVIDER_SITE_OTHER): Payer: Medicare Other

## 2013-11-21 DIAGNOSIS — E7800 Pure hypercholesterolemia, unspecified: Secondary | ICD-10-CM

## 2013-11-21 DIAGNOSIS — E119 Type 2 diabetes mellitus without complications: Secondary | ICD-10-CM

## 2013-11-21 DIAGNOSIS — I10 Essential (primary) hypertension: Secondary | ICD-10-CM

## 2013-11-21 LAB — COMPREHENSIVE METABOLIC PANEL
ALT: 20 U/L (ref 0–35)
AST: 21 U/L (ref 0–37)
Albumin: 3.8 g/dL (ref 3.5–5.2)
Alkaline Phosphatase: 57 U/L (ref 39–117)
BILIRUBIN TOTAL: 0.8 mg/dL (ref 0.2–1.2)
BUN: 15 mg/dL (ref 6–23)
CO2: 27 mEq/L (ref 19–32)
CREATININE: 0.8 mg/dL (ref 0.4–1.2)
Calcium: 9.5 mg/dL (ref 8.4–10.5)
Chloride: 107 mEq/L (ref 96–112)
GFR: 71.84 mL/min (ref >60.00)
Glucose, Bld: 140 mg/dL — ABNORMAL HIGH (ref 70–99)
Potassium: 4.5 mEq/L (ref 3.5–5.1)
SODIUM: 141 meq/L (ref 135–145)
TOTAL PROTEIN: 6.7 g/dL (ref 6.0–8.3)

## 2013-11-21 LAB — CBC WITH DIFFERENTIAL/PLATELET
BASOS ABS: 0 10*3/uL (ref 0.0–0.1)
Basophils Relative: 0.5 % (ref 0.0–3.0)
EOS ABS: 0.2 10*3/uL (ref 0.0–0.7)
Eosinophils Relative: 2.7 % (ref 0.0–5.0)
HEMATOCRIT: 38 % (ref 36.0–46.0)
Hemoglobin: 12.6 g/dL (ref 12.0–15.0)
LYMPHS ABS: 1.7 10*3/uL (ref 0.7–4.0)
Lymphocytes Relative: 29 % (ref 12.0–46.0)
MCHC: 33.2 g/dL (ref 30.0–36.0)
MCV: 88.4 fl (ref 78.0–100.0)
MONO ABS: 0.3 10*3/uL (ref 0.1–1.0)
Monocytes Relative: 5.6 % (ref 3.0–12.0)
Neutro Abs: 3.7 10*3/uL (ref 1.4–7.7)
Neutrophils Relative %: 62.2 % (ref 43.0–77.0)
Platelets: 192 10*3/uL (ref 150.0–400.0)
RBC: 4.29 Mil/uL (ref 3.87–5.11)
RDW: 14.1 % (ref 11.5–15.5)
WBC: 5.9 10*3/uL (ref 4.0–10.5)

## 2013-11-21 LAB — HEMOGLOBIN A1C: Hgb A1c MFr Bld: 7.2 % — ABNORMAL HIGH (ref 4.6–6.5)

## 2013-11-21 LAB — LIPID PANEL
Cholesterol: 129 mg/dL (ref 0–200)
HDL: 49.9 mg/dL (ref >39.00)
LDL Cholesterol: 48 mg/dL (ref 0–99)
NONHDL: 79.1
Total CHOL/HDL Ratio: 3
Triglycerides: 155 mg/dL — ABNORMAL HIGH (ref 0.0–149.0)
VLDL: 31 mg/dL (ref 0.0–40.0)

## 2013-11-21 LAB — TSH: TSH: 0.94 u[IU]/mL (ref 0.35–4.50)

## 2013-11-28 ENCOUNTER — Encounter: Payer: Self-pay | Admitting: Family Medicine

## 2013-11-28 ENCOUNTER — Ambulatory Visit (INDEPENDENT_AMBULATORY_CARE_PROVIDER_SITE_OTHER): Payer: Medicare Other | Admitting: Family Medicine

## 2013-11-28 VITALS — BP 126/72 | HR 74 | Temp 98.3°F | Ht 61.0 in | Wt 209.5 lb

## 2013-11-28 DIAGNOSIS — Z23 Encounter for immunization: Secondary | ICD-10-CM

## 2013-11-28 DIAGNOSIS — Z0000 Encounter for general adult medical examination without abnormal findings: Secondary | ICD-10-CM

## 2013-11-28 DIAGNOSIS — E669 Obesity, unspecified: Secondary | ICD-10-CM

## 2013-11-28 DIAGNOSIS — I10 Essential (primary) hypertension: Secondary | ICD-10-CM

## 2013-11-28 DIAGNOSIS — E7800 Pure hypercholesterolemia, unspecified: Secondary | ICD-10-CM

## 2013-11-28 DIAGNOSIS — E119 Type 2 diabetes mellitus without complications: Secondary | ICD-10-CM

## 2013-11-28 MED ORDER — METRONIDAZOLE 0.75 % EX GEL: Freq: Every day | CUTANEOUS | Status: DC

## 2013-11-28 NOTE — Assessment & Plan Note (Signed)
Lab Results  Component Value Date   HGBA1C 7.2* 11/21/2013    Stable  Enc wt loss to help get this to goal  Good diet -need to reduce portions and move more overall Rev low glycemic diet  F/u 3 mo with lab prior

## 2013-11-28 NOTE — Progress Notes (Signed)
Subjective:    Patient ID: Gabriella Howe, female    DOB: 03/07/1935, 79 y.o.   MRN: [id number]  HPI I have personally reviewed the Medicare Annual Wellness questionnaire and have noted 1. The patient's medical and social history 2. Their use of alcohol, tobacco or illicit drugs 3. Their current medications and supplements 4. The patient's functional ability including ADL's, fall risks, home safety risks and hearing or visual             impairment. 5. Diet and physical activities 6. Evidence for depression or mood disorders  The patients weight, height, BMI have been recorded in the chart and visual acuity is per eye clinic.  I have made referrals, counseling and provided education to the patient based review of the above and I have provided the pt with a written personalized care plan for preventive services.  Feels pretty good overall  Gets tired more easily than she used to - thinks she is doing pretty well for her age    See scanned forms.  Routine anticipatory guidance given to patient.  See health maintenance. Colon cancer screening colonosc 3/11-had polyps , thinks she has had 5 year recall  Breast cancer screening last mammo 9/13- was abn- she did not get one this past year/ missed it  Self breast exam-no lumps on exam  Flu vaccine had today Tetanus vaccine - is allergic to Tetanus shots  Pneumovax  2005 - will get the prevnar vaccine  Zoster vaccine- declined in the past - she may reconsider - her copay is 95$- at the drug store   Advance directive- has a living will and adv dir and POA  Cognitive function addressed- see scanned forms- and if abnormal then additional documentation follows. She forgets names occas-otherwise no problems with it  She is learning more computer skills   PMH and [place] reviewed  Meds, vitals, and allergies reviewed.   ROS: See HPI.  Otherwise negative.     bp is stable today  No cp or palpitations or headaches or edema  No side effects  to medicines  BP Readings from Last 3 Encounters:  11/28/13 126/72  08/26/13 118/70  05/26/13 118/78     Hyperlipidemia  Simvastatin and diet Lab Results  Component Value Date   CHOL 129 11/21/2013   CHOL 120 11/19/2012   CHOL 129 05/14/2012   Lab Results  Component Value Date   HDL 49.90 11/21/2013   HDL 38.20* 11/19/2012   HDL 54.80 05/14/2012   Lab Results  Component Value Date   LDLCALC 48 11/21/2013   LDLCALC 47 11/19/2012   LDLCALC 48 05/14/2012   Lab Results  Component Value Date   TRIG 155.0* 11/21/2013   TRIG 174.0* 11/19/2012   TRIG 130.0 05/14/2012   Lab Results  Component Value Date   CHOLHDL 3 11/21/2013   CHOLHDL 3 11/19/2012   CHOLHDL 2 05/14/2012   No results found for this basename: LDLDIRECT  stable with better HDL  Eats "fairly: healthy --- no more fried foods overall  Eats some fish  Avoids grease and red meat   Diabetes Home sugar results - the same  DM diet - is the same  Exercise - no regular program but she is active as home -up and down the stairs all day at home  Carries 20-40 lb bags on a regular basis , does housework Symptoms-none  A1C last  Lab Results  Component Value Date   HGBA1C 7.2* 11/21/2013  down from  7.3   No problems with medications  Renal protection ace  Last eye exam 7/15   Patient Active Problem List   Diagnosis Date Noted  . Encounter for Medicare annual wellness exam 11/19/2012  . History of kidney stones 11/19/2012  . Abnormal mammogram 12/21/2011  . Kidney stone 06/07/2011  . Other screening mammogram 08/10/2010  . DIASTOLIC HEART FAILURE, CHRONIC 07/29/2009  . DYSPNEA 07/23/2009  . HYPERCHOLESTEROLEMIA, PURE 09/13/2006  . FLUID RETENTION 09/10/2006  . OBESITY 09/10/2006  . CARPAL TUNNEL SYNDROME 09/10/2006  . HYPERTENSION 09/10/2006  . DIASTOLIC DYSFUNCTION 08/67/6195  . REACTIVE AIRWAY DISEASE 09/10/2006  . GERD 09/10/2006  . ROSACEA 09/10/2006  . DEGENERATIVE DISC DISEASE 09/10/2006  . SLEEP APNEA  09/10/2006  . COLONIC POLYPS, HX OF 09/10/2006  . DIABETES MELLITUS, TYPE II 09/06/2006   Past Medical History  Diagnosis Date  . CHF (congestive heart failure)     diastolic  . Heart disease, unspecified   . Hypertension   . Hyperlipidemia   . Chest pain, unspecified   . Shortness of breath   . Fluid overload   . GERD (gastroesophageal reflux disease)   . Obesity, unspecified   . Diabetes mellitus     type II  . Unspecified asthma(493.90)   . Benign neoplasm of colon   . Unspecified sleep apnea   . Carpal tunnel syndrome   . Rosacea   . Degeneration of intervertebral disc, site unspecified   . Personal history of colonic polyps   . Encounter for long-term (current) use of other medications   . Kidney stone 3/12    hosp/ with stent   Past Surgical History  Procedure Laterality Date  . Abdominal hysterectomy      partial, fibroids  . Esophagogastroduodenoscopy  2000  . Umbilical hernia repair  01/2004  . Ureteral stent placement  2/13    for L sided stone/ ecoli sepsis   History  Substance Use Topics  . Smoking status: Never Smoker   . Smokeless tobacco: Not on file  . Alcohol Use: No   No family history on file. Allergies  Allergen Reactions  . Rofecoxib     REACTION: reaction not known  . Tetanus-Diphtheria Toxoids Td     REACTION: sick with fever  . Antihistamines, Chlorpheniramine-Type Palpitations  . Sulfa Antibiotics Rash   Current Outpatient Prescriptions on File Prior to Visit  Medication Sig Dispense Refill  . Ascorbic Acid (VITAMIN C) 500 MG tablet Take 500 mg by mouth daily.        . Aspirin-Caffeine (BC FAST PAIN RELIEF PO) Take by mouth as directed.        . benazepril (LOTENSIN) 20 MG tablet Take 1 tablet (20 mg total) by mouth daily.  90 tablet  3  . CALCIUM-VITAMIN D PO Take 1 tablet by mouth 2 (two) times daily.       . furosemide (LASIX) 40 MG tablet Take 0.5 tablets (20 mg total) by mouth daily.  45 tablet  3  . glucose blood (BAYER  CONTOUR TEST) test strip Use to check blood sugar once daily as directed for DM 250.0  100 each  1  . Magnesium 250 MG TABS Take by mouth daily.      . meloxicam (MOBIC) 15 MG tablet Take 1 tablet by mouth as needed.      . metFORMIN (GLUCOPHAGE) 1000 MG tablet Take 1 tablet (1,000 mg total) by mouth 2 (two) times daily with a meal.  180 tablet  3  .  metroNIDAZOLE (METROGEL) 0.75 % gel Apply topically daily. To affected area on face.Rosacea.  45 g  3  . MICROLET LANCETS MISC Check blood sugar once daily as directed for DM 250.0  100 each  1  . Multiple Vitamin (MULTIVITAMIN) capsule Take 1 capsule by mouth daily.        . Omega-3 Fatty Acids (FISH OIL PO) Take 1 capsule by mouth daily.       Marland Kitchen omeprazole (PRILOSEC) 20 MG capsule Take 1 capsule (20 mg total) by mouth daily.  90 capsule  3  . Pseudoephedrine HCl (SINUS & ALLERGY 12 HOUR PO) Take by mouth as directed.        . simvastatin (ZOCOR) 40 MG tablet Take 1 tablet (40 mg total) by mouth at bedtime.  90 tablet  3  . vitamin E 400 UNIT capsule Take 400 Units by mouth daily.         No current facility-administered medications on file prior to visit.     Review of Systems Review of Systems  Constitutional: Negative for fever, appetite change, and unexpected weight change. pos for fatigue  Eyes: Negative for pain and visual disturbance.  Respiratory: Negative for cough and shortness of breath.   Cardiovascular: Negative for cp or palpitations    Gastrointestinal: Negative for nausea, diarrhea and constipation.  Genitourinary: Negative for urgency and frequency.  Skin: Negative for pallor or rash   Neurological: Negative for weakness, light-headedness, numbness and headaches.  Hematological: Negative for adenopathy. Does not bruise/bleed easily.  Psychiatric/Behavioral: Negative for dysphoric mood. The patient is not nervous/anxious.         Objective:   Physical Exam  Constitutional: She appears well-developed and well-nourished.  No distress.  obese and well appearing   HENT:  Head: Normocephalic and atraumatic.  Right Ear: External ear normal.  Left Ear: External ear normal.  Mouth/Throat: Oropharynx is clear and moist.  Eyes: Conjunctivae and EOM are normal. Pupils are equal, round, and reactive to light. No scleral icterus.  Neck: Normal range of motion. Neck supple. No JVD present. Carotid bruit is not present. No thyromegaly present.  Cardiovascular: Normal rate, regular rhythm, normal heart sounds and intact distal pulses.  Exam reveals no gallop.   Pulmonary/Chest: Effort normal and breath sounds normal. No respiratory distress. She has no wheezes. She exhibits no tenderness.  Abdominal: Soft. Bowel sounds are normal. She exhibits no distension, no abdominal bruit and no mass. There is no tenderness.  Genitourinary: No breast swelling, tenderness, discharge or bleeding.  Breast exam: No mass, nodules, thickening, tenderness, bulging, retraction, inflamation, nipple discharge or skin changes noted.  No axillary or clavicular LA.      Musculoskeletal: Normal range of motion. She exhibits no edema and no tenderness.  Lymphadenopathy:    She has no cervical adenopathy.  Neurological: She is alert. She has normal reflexes. No cranial nerve deficit. She exhibits normal muscle tone. Coordination normal.  Skin: Skin is warm and dry. No rash noted. No erythema. No pallor.  Psychiatric: She has a normal mood and affect.          Assessment & Plan:   Problem List Items Addressed This Visit     Cardiovascular and Mediastinum   HYPERTENSION      bp is stable today  No cp or palpitations or headaches or edema  No side effects to medicines  bp in fair control at this time  BP Readings from Last 1 Encounters:  11/28/13 126/72   No  changes needed Disc lifstyle change with low sodium diet and exercise        Endocrine   DIABETES MELLITUS, TYPE II      Lab Results  Component Value Date   HGBA1C 7.2*  11/21/2013    Stable  Enc wt loss to help get this to goal  Good diet -need to reduce portions and move more overall Rev low glycemic diet  F/u 3 mo with lab prior       Other   HYPERCHOLESTEROLEMIA, PURE     Disc goals for lipids and reasons to control them Rev labs with pt Rev low sat fat diet in detail Will continue statin and diet     OBESITY     Discussed how this problem influences overall health and the risks it imposes  Reviewed plan for weight loss with lower calorie diet (via better food choices and also portion control or program like weight watchers) and exercise building up to or more than 30 minutes 5 days per week including some aerobic activity        Encounter for Medicare annual wellness exam - Primary     Reviewed health habits including diet and exercise and skin cancer prevention Reviewed appropriate screening tests for age  Also reviewed health mt list, fam hx and immunization status , as well as social and family history   See HPI Flu vaccine and prevnar today She will call about zostavax coverage  Labs reveiwed Wt loss enc     Other Visit Diagnoses   Need for prophylactic vaccination and inoculation against influenza        Relevant Orders       Flu Vaccine QUAD 36+ mos PF IM (Fluarix Quad PF) (Completed)    Need for vaccination with 13-polyvalent pneumococcal conjugate vaccine        Relevant Orders       Pneumococcal conjugate vaccine 13-valent (Completed)

## 2013-11-28 NOTE — Assessment & Plan Note (Signed)
Reviewed health habits including diet and exercise and skin cancer prevention Reviewed appropriate screening tests for age  Also reviewed health mt list, fam hx and immunization status , as well as social and family history   See HPI Flu vaccine and prevnar today She will call about zostavax coverage  Labs reveiwed Wt loss enc

## 2013-11-28 NOTE — Progress Notes (Signed)
Pre visit review using our clinic review tool, if applicable. No additional management support is needed unless otherwise documented below in the visit note. 

## 2013-11-28 NOTE — Patient Instructions (Addendum)
If you are interested in a shingles/zoster vaccine - call your insurance to check on coverage,( you should not get it within 1 month of other vaccines) , then call us for a prescription  for it to take to a pharmacy that gives the shot , or make a nurse visit to get it here depending on your coverage  Flu vaccine today prevnar vaccine today Don't forget to schedule your own mammogram   Your A1C is 7.2-goal is under 7  Eat a low sugar diet- work on weight loss (walk on treadmill more and decrease portions)  Even a small weight loss can help diabetes control   Follow up in 3 months with labs prior

## 2013-11-28 NOTE — Assessment & Plan Note (Signed)
bp is stable today  No cp or palpitations or headaches or edema  No side effects to medicines  bp in fair control at this time  BP Readings from Last 1 Encounters:  11/28/13 126/72   No changes needed Disc lifstyle change with low sodium diet and exercise

## 2013-11-28 NOTE — Assessment & Plan Note (Signed)
Discussed how this problem influences overall health and the risks it imposes  Reviewed plan for weight loss with lower calorie diet (via better food choices and also portion control or program like weight watchers) and exercise building up to or more than 30 minutes 5 days per week including some aerobic activity    

## 2013-11-28 NOTE — Assessment & Plan Note (Signed)
Disc goals for lipids and reasons to control them Rev labs with pt Rev low sat fat diet in detail Will continue statin and diet

## 2014-03-05 ENCOUNTER — Telehealth: Payer: Self-pay | Admitting: Family Medicine

## 2014-03-05 DIAGNOSIS — E119 Type 2 diabetes mellitus without complications: Secondary | ICD-10-CM

## 2014-03-05 NOTE — Telephone Encounter (Signed)
-----   Message from Ellamae Sia sent at 02/25/2014  3:04 PM EST ----- Regarding: Lab orders for Friday, 12.11.15 Lab orders for f/u appt

## 2014-03-06 ENCOUNTER — Other Ambulatory Visit (INDEPENDENT_AMBULATORY_CARE_PROVIDER_SITE_OTHER): Payer: Medicare Other

## 2014-03-06 DIAGNOSIS — E119 Type 2 diabetes mellitus without complications: Secondary | ICD-10-CM

## 2014-03-06 LAB — HEMOGLOBIN A1C: HEMOGLOBIN A1C: 7.5 % — AB (ref 4.6–6.5)

## 2014-03-11 ENCOUNTER — Ambulatory Visit (INDEPENDENT_AMBULATORY_CARE_PROVIDER_SITE_OTHER): Payer: Medicare Other | Admitting: Family Medicine

## 2014-03-11 ENCOUNTER — Encounter: Payer: Self-pay | Admitting: Family Medicine

## 2014-03-11 VITALS — BP 128/68 | HR 77 | Temp 98.1°F | Ht 61.0 in | Wt 208.2 lb

## 2014-03-11 DIAGNOSIS — E119 Type 2 diabetes mellitus without complications: Secondary | ICD-10-CM

## 2014-03-11 MED ORDER — METRONIDAZOLE 0.75 % EX GEL: Freq: Every day | CUTANEOUS | Status: DC

## 2014-03-11 NOTE — Progress Notes (Signed)
Pre visit review using our clinic review tool, if applicable. No additional management support is needed unless otherwise documented below in the visit note. 

## 2014-03-11 NOTE — Patient Instructions (Signed)
Your A1C is up  Follow your diabetic diet  Also work up to 30 minutes on the treadmill 5 days per week  See the eye doctor as planned  Follow up in 3 months with labs prior

## 2014-03-11 NOTE — Progress Notes (Signed)
Subjective:    Patient ID: Gabriella Howe, female    DOB: Nov 24, 1934, 79 y.o.   MRN: [id number]  HPI Here for f/u of DM  Feeling pretty good  Gets tired more easily   Working a lot but she likes her job  Always busy sept to [employer] is down 1 lb with bmi of 39  Diabetes Home sugar results - running about the same  DM diet - has been fair/ not the best (working a lot and no time for pre planning) -- eating fast food and eating on the run , also eating cookies , and a lot of "TV dinners"  Exercise - not a lot due to back (limited) - she can walk on a treadmill but just does not  Does some heavy lifting  Symptoms A1C last  Lab Results  Component Value Date   HGBA1C 7.5* 03/06/2014  this is up from 7.2   She has good intention for change   No problems with medications - metformin , and she refuses more medicine  Renal protection ace  Last eye exam 6/15, and she has an appt Monday (has also had cataract surgeries)     Patient Active Problem List   Diagnosis Date Noted  . Encounter for Medicare annual wellness exam 11/19/2012  . History of kidney stones 11/19/2012  . Abnormal mammogram 12/21/2011  . Kidney stone 06/07/2011  . Other screening mammogram 08/10/2010  . DIASTOLIC HEART FAILURE, CHRONIC 07/29/2009  . DYSPNEA 07/23/2009  . HYPERCHOLESTEROLEMIA, PURE 09/13/2006  . FLUID RETENTION 09/10/2006  . OBESITY 09/10/2006  . CARPAL TUNNEL SYNDROME 09/10/2006  . HYPERTENSION 09/10/2006  . DIASTOLIC DYSFUNCTION 84/66/5993  . REACTIVE AIRWAY DISEASE 09/10/2006  . GERD 09/10/2006  . ROSACEA 09/10/2006  . DEGENERATIVE DISC DISEASE 09/10/2006  . SLEEP APNEA 09/10/2006  . COLONIC POLYPS, HX OF 09/10/2006  . Diabetes type 2, controlled 09/06/2006   Past Medical History  Diagnosis Date  . CHF (congestive heart failure)     diastolic  . Heart disease, unspecified   . Hypertension   . Hyperlipidemia   . Chest pain, unspecified   . Shortness of breath   . Fluid  overload   . GERD (gastroesophageal reflux disease)   . Obesity, unspecified   . Diabetes mellitus     type II  . Unspecified asthma(493.90)   . Benign neoplasm of colon   . Unspecified sleep apnea   . Carpal tunnel syndrome   . Rosacea   . Degeneration of intervertebral disc, site unspecified   . Personal history of colonic polyps   . Encounter for long-term (current) use of other medications   . Kidney stone 3/12    hosp/ with stent   Past Surgical History  Procedure Laterality Date  . Abdominal hysterectomy      partial, fibroids  . Esophagogastroduodenoscopy  2000  . Umbilical hernia repair  01/2004  . Ureteral stent placement  2/13    for L sided stone/ ecoli sepsis   History  Substance Use Topics  . Smoking status: Never Smoker   . Smokeless tobacco: Not on file  . Alcohol Use: No   No family history on file. Allergies  Allergen Reactions  . Rofecoxib     REACTION: reaction not known  . Tetanus-Diphtheria Toxoids Td     REACTION: sick with fever  . Antihistamines, Chlorpheniramine-Type Palpitations  . Sulfa Antibiotics Rash   Current Outpatient Prescriptions on File Prior to Visit  Medication  Sig Dispense Refill  . Ascorbic Acid (VITAMIN C) 500 MG tablet Take 500 mg by mouth daily.      . Aspirin-Caffeine (BC FAST PAIN RELIEF PO) Take by mouth as directed.      . benazepril (LOTENSIN) 20 MG tablet Take 1 tablet (20 mg total) by mouth daily. 90 tablet 3  . CALCIUM-VITAMIN D PO Take 1 tablet by mouth 2 (two) times daily.     . furosemide (LASIX) 40 MG tablet Take 0.5 tablets (20 mg total) by mouth daily. 45 tablet 3  . glucose blood (BAYER CONTOUR TEST) test strip Use to check blood sugar once daily as directed for DM 250.0 100 each 1  . Magnesium 250 MG TABS Take by mouth daily.    . metFORMIN (GLUCOPHAGE) 1000 MG tablet Take 1 tablet (1,000 mg total) by mouth 2 (two) times daily with a meal. 180 tablet 3  . metroNIDAZOLE (METROGEL) 0.75 % gel Apply topically  daily. To affected area on face.Rosacea. 45 g 3  . MICROLET LANCETS MISC Check blood sugar once daily as directed for DM 250.0 100 each 1  . Multiple Vitamin (MULTIVITAMIN) capsule Take 1 capsule by mouth daily.      . Omega-3 Fatty Acids (FISH OIL PO) Take 1 capsule by mouth daily.     Marland Kitchen omeprazole (PRILOSEC) 20 MG capsule Take 1 capsule (20 mg total) by mouth daily. 90 capsule 3  . Pseudoephedrine HCl (SINUS & ALLERGY 12 HOUR PO) Take by mouth as directed.      . simvastatin (ZOCOR) 40 MG tablet Take 1 tablet (40 mg total) by mouth at bedtime. 90 tablet 3  . vitamin E 400 UNIT capsule Take 400 Units by mouth daily.       No current facility-administered medications on file prior to visit.      Review of Systems Review of Systems  Constitutional: Negative for fever, appetite change, fatigue and unexpected weight change.  Eyes: Negative for pain and visual disturbance.  Respiratory: Negative for cough and shortness of breath.   Cardiovascular: Negative for cp or palpitations    Gastrointestinal: Negative for nausea, diarrhea and constipation.  Genitourinary: Negative for urgency and frequency.  Skin: Negative for pallor or rash   Neurological: Negative for weakness, light-headedness, numbness and headaches.  Hematological: Negative for adenopathy. Does not bruise/bleed easily.  Psychiatric/Behavioral: Negative for dysphoric mood. The patient is not nervous/anxious.         Objective:   Physical Exam  Constitutional: She appears well-developed and well-nourished. No distress.  obese and well appearing   HENT:  Head: Normocephalic and atraumatic.  Mouth/Throat: Oropharynx is clear and moist.  Eyes: Conjunctivae and EOM are normal. Pupils are equal, round, and reactive to light. No scleral icterus.  Neck: Normal range of motion. Neck supple. No JVD present. Carotid bruit is not present. No thyromegaly present.  Cardiovascular: Normal rate, regular rhythm, normal heart sounds and  intact distal pulses.  Exam reveals no gallop.   Pulmonary/Chest: Effort normal and breath sounds normal. No respiratory distress. She has no wheezes. She has no rales.  Abdominal: Soft. Bowel sounds are normal.  Musculoskeletal: She exhibits no edema or tenderness.  Lymphadenopathy:    She has no cervical adenopathy.  Neurological: She is alert. She has normal reflexes. No cranial nerve deficit. She exhibits normal muscle tone. Coordination normal.  Skin: Skin is warm and dry. No rash noted. No erythema. No pallor.  Psychiatric: She has a normal mood and affect.  Assessment & Plan:   Problem List Items Addressed This Visit      Endocrine   Diabetes type 2, controlled - Primary    Lab Results  Component Value Date   HGBA1C 7.5* 03/06/2014   Noncompliant with diet and exercise  She promises to get back on track  Disc obstacles to this in detail Declines further med of any kind Lifestyle change plan made >25 minutes spent in face to face time with patient, >50% spent in counselling or coordination of care   Lab and f/u 3 mo     Relevant Orders      Hemoglobin A1c      Lipid panel

## 2014-03-12 ENCOUNTER — Telehealth: Payer: Self-pay | Admitting: Family Medicine

## 2014-03-12 NOTE — Assessment & Plan Note (Signed)
Lab Results  Component Value Date   HGBA1C 7.5* 03/06/2014   Noncompliant with diet and exercise  She promises to get back on track  Disc obstacles to this in detail Declines further med of any kind Lifestyle change plan made >25 minutes spent in face to face time with patient, >50% spent in counselling or coordination of care   Lab and f/u 3 mo

## 2014-03-12 NOTE — Telephone Encounter (Signed)
emmi emailed °

## 2014-03-16 LAB — HM DIABETES EYE EXAM

## 2014-04-21 ENCOUNTER — Encounter: Payer: Self-pay | Admitting: Family Medicine

## 2014-05-12 ENCOUNTER — Encounter: Payer: Self-pay | Admitting: Internal Medicine

## 2014-05-21 ENCOUNTER — Other Ambulatory Visit: Payer: Self-pay | Admitting: Family Medicine

## 2014-05-22 DIAGNOSIS — H04123 Dry eye syndrome of bilateral lacrimal glands: Secondary | ICD-10-CM | POA: Diagnosis not present

## 2014-05-26 ENCOUNTER — Encounter: Payer: Self-pay | Admitting: Internal Medicine

## 2014-06-03 ENCOUNTER — Other Ambulatory Visit (INDEPENDENT_AMBULATORY_CARE_PROVIDER_SITE_OTHER): Payer: Medicare Other

## 2014-06-03 DIAGNOSIS — E119 Type 2 diabetes mellitus without complications: Secondary | ICD-10-CM | POA: Diagnosis not present

## 2014-06-03 LAB — LIPID PANEL
Cholesterol: 115 mg/dL (ref 0–200)
HDL: 55.8 mg/dL (ref >39.00)
LDL Cholesterol: 42 mg/dL (ref 0–99)
NONHDL: 59.2
Total CHOL/HDL Ratio: 2
Triglycerides: 87 mg/dL (ref 0.0–149.0)
VLDL: 17.4 mg/dL (ref 0.0–40.0)

## 2014-06-03 LAB — HEMOGLOBIN A1C: HEMOGLOBIN A1C: 7.4 % — AB (ref 4.6–6.5)

## 2014-06-10 ENCOUNTER — Encounter: Payer: Self-pay | Admitting: Family Medicine

## 2014-06-10 ENCOUNTER — Ambulatory Visit (INDEPENDENT_AMBULATORY_CARE_PROVIDER_SITE_OTHER): Payer: Medicare Other | Admitting: Family Medicine

## 2014-06-10 VITALS — BP 132/68 | HR 89 | Temp 97.7°F | Ht 61.0 in | Wt 211.8 lb

## 2014-06-10 DIAGNOSIS — I10 Essential (primary) hypertension: Secondary | ICD-10-CM

## 2014-06-10 DIAGNOSIS — M5137 Other intervertebral disc degeneration, lumbosacral region: Secondary | ICD-10-CM

## 2014-06-10 DIAGNOSIS — E669 Obesity, unspecified: Secondary | ICD-10-CM | POA: Diagnosis not present

## 2014-06-10 DIAGNOSIS — E119 Type 2 diabetes mellitus without complications: Secondary | ICD-10-CM | POA: Diagnosis not present

## 2014-06-10 MED ORDER — BENAZEPRIL HCL 20 MG PO TABS: ORAL_TABLET | ORAL | Status: DC

## 2014-06-10 MED ORDER — OMEPRAZOLE 20 MG PO CPDR: DELAYED_RELEASE_CAPSULE | ORAL | Status: DC

## 2014-06-10 MED ORDER — METFORMIN HCL 1000 MG PO TABS: 1000.0000 mg | ORAL_TABLET | Freq: Two times a day (BID) | ORAL | Status: DC

## 2014-06-10 MED ORDER — FUROSEMIDE 40 MG PO TABS: ORAL_TABLET | ORAL | Status: DC

## 2014-06-10 MED ORDER — SIMVASTATIN 40 MG PO TABS: ORAL_TABLET | ORAL | Status: DC

## 2014-06-10 NOTE — Assessment & Plan Note (Signed)
Lab Results  Component Value Date   HGBA1C 7.4* 06/03/2014   Just started to work on lifestyle change 2 wk ago  Using a new program Disc need for wt loss and low impact exercise  Rev DM diet  F/u 3 mo lab prior

## 2014-06-10 NOTE — Patient Instructions (Addendum)
Keep using program to log in  Make a goal of 1-2 lb of weight loss per week  Aim to work up to 30 minutes of extra exercise per day  Stick with a diabetic diet  Follow up in 3 months with labs prior   When you are out -think about using a walker to prevent falls an support you /with back pain

## 2014-06-10 NOTE — Assessment & Plan Note (Signed)
bp in fair control at this time  BP Readings from Last 1 Encounters:  06/10/14 132/68   No changes needed Disc lifstyle change with low sodium diet and exercise

## 2014-06-10 NOTE — Progress Notes (Signed)
Pre visit review using our clinic review tool, if applicable. No additional management support is needed unless otherwise documented below in the visit note. 

## 2014-06-10 NOTE — Progress Notes (Signed)
Subjective:    Patient ID: Gabriella Howe, female    DOB: January 06, 1935, 80 y.o.   MRN: [id number]  HPI Here for f/u of chronic health problems  Wt is up 3 lb with bmi of 29 Feels ok most of the time   Self care is fair - trying to be better  She found a site from [place] [facility]- to track her diet and exercise -that is helpful (gets re imbursed)   bp is stable today  No cp or palpitations or headaches or edema  No side effects to medicines  BP Readings from Last 3 Encounters:  06/10/14 132/68  03/11/14 128/68  11/28/13 126/72     Diabetes- did not change any habits until 2 weeks ago -now trying accountability progam  Home sugar results  DM diet -now getting rid of sweets and decrease bread  Exercise started walking on treadmill "some"  Symptoms A1C last  Lab Results  Component Value Date   HGBA1C 7.4* 06/03/2014  last time it was 7.5  No problems with medications -metformin / declines any other treatment  Renal protection ace  Last eye exam 12/15   Lipids are controlled Lab Results  Component Value Date   CHOL 115 06/03/2014   HDL 55.80 06/03/2014   LDLCALC 42 06/03/2014   TRIG 87.0 06/03/2014   CHOLHDL 2 06/03/2014    On zocor and diet   Patient Active Problem List   Diagnosis Date Noted  . Encounter for Medicare annual wellness exam 11/19/2012  . History of kidney stones 11/19/2012  . Abnormal mammogram 12/21/2011  . Kidney stone 06/07/2011  . Other screening mammogram 08/10/2010  . DIASTOLIC HEART FAILURE, CHRONIC 07/29/2009  . DYSPNEA 07/23/2009  . HYPERCHOLESTEROLEMIA, PURE 09/13/2006  . FLUID RETENTION 09/10/2006  . OBESITY 09/10/2006  . CARPAL TUNNEL SYNDROME 09/10/2006  . HYPERTENSION 09/10/2006  . DIASTOLIC DYSFUNCTION 08/65/7846  . REACTIVE AIRWAY DISEASE 09/10/2006  . GERD 09/10/2006  . ROSACEA 09/10/2006  . DEGENERATIVE DISC DISEASE 09/10/2006  . SLEEP APNEA 09/10/2006  . COLONIC POLYPS, HX OF 09/10/2006  . Diabetes type 2, controlled  09/06/2006   Past Medical History  Diagnosis Date  . CHF (congestive heart failure)     diastolic  . Heart disease, unspecified   . Hypertension   . Hyperlipidemia   . Chest pain, unspecified   . Shortness of breath   . Fluid overload   . GERD (gastroesophageal reflux disease)   . Obesity, unspecified   . Diabetes mellitus     type II  . Unspecified asthma(493.90)   . Benign neoplasm of colon   . Unspecified sleep apnea   . Carpal tunnel syndrome   . Rosacea   . Degeneration of intervertebral disc, site unspecified   . Personal history of colonic polyps   . Encounter for long-term (current) use of other medications   . Kidney stone 3/12    hosp/ with stent   Past Surgical History  Procedure Laterality Date  . Abdominal hysterectomy      partial, fibroids  . Esophagogastroduodenoscopy  2000  . Umbilical hernia repair  01/2004  . Ureteral stent placement  2/13    for L sided stone/ ecoli sepsis   History  Substance Use Topics  . Smoking status: Never Smoker   . Smokeless tobacco: Not on file  . Alcohol Use: No   No family history on file. Allergies  Allergen Reactions  . Rofecoxib     REACTION: reaction not known  .  Tetanus-Diphtheria Toxoids Td     REACTION: sick with fever  . Antihistamines, Chlorpheniramine-Type Palpitations  . Sulfa Antibiotics Rash   Current Outpatient Prescriptions on File Prior to Visit  Medication Sig Dispense Refill  . Ascorbic Acid (VITAMIN C) 500 MG tablet Take 500 mg by mouth daily.      . Aspirin-Caffeine (BC FAST PAIN RELIEF PO) Take by mouth as directed.      . benazepril (LOTENSIN) 20 MG tablet Take 1 tablet by mouth  daily 90 tablet 0  . CALCIUM-VITAMIN D PO Take 1 tablet by mouth 2 (two) times daily.     . furosemide (LASIX) 40 MG tablet Take one-half tablet by  mouth daily 45 tablet 0  . glucose blood (BAYER CONTOUR TEST) test strip Use to check blood sugar once daily as directed for DM 250.0 100 each 1  . Magnesium 250 MG  TABS Take by mouth daily.    . metFORMIN (GLUCOPHAGE) 1000 MG tablet Take 1 tablet (1,000 mg total) by mouth 2 (two) times daily with a meal. 180 tablet 3  . metroNIDAZOLE (METROGEL) 0.75 % gel Apply topically daily. To affected area on face.Rosacea. 45 g 3  . MICROLET LANCETS MISC Check blood sugar once daily as directed for DM 250.0 100 each 1  . Multiple Vitamin (MULTIVITAMIN) capsule Take 1 capsule by mouth daily.      . Omega-3 Fatty Acids (FISH OIL PO) Take 1 capsule by mouth daily.     Marland Kitchen omeprazole (PRILOSEC) 20 MG capsule Take 1 capsule by mouth  daily 90 capsule 0  . Pseudoephedrine HCl (SINUS & ALLERGY 12 HOUR PO) Take by mouth as directed.      . simvastatin (ZOCOR) 40 MG tablet Take 1 tablet by mouth at  bedtime 90 tablet 0  . vitamin E 400 UNIT capsule Take 400 Units by mouth daily.       No current facility-administered medications on file prior to visit.     Review of Systems Review of Systems  Constitutional: Negative for fever, appetite change,  and unexpected weight change.  Eyes: Negative for pain and visual disturbance.  Respiratory: Negative for cough and shortness of breath.   Cardiovascular: Negative for cp or palpitations    Gastrointestinal: Negative for nausea, diarrhea and constipation.  Genitourinary: Negative for urgency and frequency.  Skin: Negative for pallor or rash   Neurological: Negative for weakness, light-headedness, numbness and headaches.  Hematological: Negative for adenopathy. Does not bruise/bleed easily.  Psychiatric/Behavioral: Negative for dysphoric mood. The patient is not nervous/anxious.         Objective:   Physical Exam  Constitutional: She appears well-developed and well-nourished. No distress.  obese and well appearing   HENT:  Head: Normocephalic and atraumatic.  Mouth/Throat: Oropharynx is clear and moist.  Eyes: Conjunctivae and EOM are normal. Pupils are equal, round, and reactive to light. No scleral icterus.  Neck: Normal  range of motion. Neck supple. No JVD present. Carotid bruit is not present. No thyromegaly present.  Cardiovascular: Normal rate, regular rhythm, normal heart sounds and intact distal pulses.  Exam reveals no gallop.   No murmur heard. Pulmonary/Chest: Effort normal and breath sounds normal. No respiratory distress. She has no wheezes. She has no rales.  Abdominal: Soft. Bowel sounds are normal. She exhibits no distension and no mass. There is no tenderness.  Musculoskeletal: She exhibits no edema.  Poor rom LS  Lymphadenopathy:    She has no cervical adenopathy.  Neurological: She is  alert. She has normal reflexes. No cranial nerve deficit. She exhibits normal muscle tone. Coordination normal.  Skin: Skin is warm and dry. No rash noted. No erythema. No pallor.  Psychiatric: She has a normal mood and affect.          Assessment & Plan:   Problem List Items Addressed This Visit      Cardiovascular and Mediastinum   Essential hypertension    bp in fair control at this time  BP Readings from Last 1 Encounters:  06/10/14 132/68   No changes needed Disc lifstyle change with low sodium diet and exercise        Relevant Medications   simvastatin (ZOCOR) tablet   benazepril (LOTENSIN) tablet   furosemide (LASIX) tablet     Endocrine   Diabetes type 2, controlled    Lab Results  Component Value Date   HGBA1C 7.4* 06/03/2014   Just started to work on lifestyle change 2 wk ago  Using a new program Disc need for wt loss and low impact exercise  Rev DM diet  F/u 3 mo lab prior       Relevant Medications   simvastatin (ZOCOR) tablet   benazepril (LOTENSIN) tablet   metFORMIN (GLUCOPHAGE) tablet     Musculoskeletal and Integument   Degeneration of lumbar or lumbosacral intervertebral disc - Primary    Pt needs handicapped sticker for car Form filled out  She cannot walk long distances Does not want to do epidural inj for her back         Other   Obesity     Discussed how this problem influences overall health and the risks it imposes  Reviewed plan for weight loss with lower calorie diet (via better food choices and also portion control or program like weight watchers) and exercise building up to or more than 30 minutes 5 days per week including some aerobic activity           Relevant Medications   metFORMIN (GLUCOPHAGE) tablet

## 2014-06-10 NOTE — Assessment & Plan Note (Signed)
Pt needs handicapped sticker for car Form filled out  She cannot walk long distances Does not want to do epidural inj for her back

## 2014-06-11 NOTE — Assessment & Plan Note (Signed)
Discussed how this problem influences overall health and the risks it imposes  Reviewed plan for weight loss with lower calorie diet (via better food choices and also portion control or program like weight watchers) and exercise building up to or more than 30 minutes 5 days per week including some aerobic activity    

## 2014-07-14 ENCOUNTER — Ambulatory Visit: Payer: Self-pay | Admitting: Internal Medicine

## 2014-07-17 NOTE — H&P (Signed)
PATIENT NAME:  Gabriella Howe, Gabriella Howe MR#:  [id number] DATE OF BIRTH:  10-Oct-1934  DATE OF ADMISSION:  11/08/2012  PRIMARY CARE PHYSICIAN:  Dr. Loura Pardon.   CHIEF COMPLAINT:  Left-sided flank pain, nausea and vomiting.   HISTORY OF PRESENT ILLNESS:  Ms. Ringler is a 80 year old pleasant Caucasian female with past medical history significant for hypertension, diabetes, hyperlipidemia and a history of left-sided ureteral lithiasis status post stent placement in 2013 by Dr. Jacqlyn Larsen, who was in her normal state of health up until this afternoon, comes to the hospital with the above-mentioned complaints.  The patient has had kidney stones in the past and had a stent placement on the left side for ureteral lithiasis By Dr. Jacqlyn Larsen last year, has not had any symptoms, was doing fine, this afternoon went to an appointment for 40 minutes, was coming back and then suddenly felt that she was having severe left-sided flank pain associated with nausea and vomiting, chills so presented to the ED.  CT of the abdomen and pelvis done here shows left-sided hydronephrosis and ureteropelvic junction stone 1.1 cm with possible pyelonephritis changes on the left kidney.  So, she is being admitted for the same.   PAST MEDICAL HISTORY: 1.  Hypertension.  2.  Diabetes.  3.  Hyperlipidemia.  4.  Gastroesophageal reflux disease.  5.  History of nephrolithiasis status post left ureteral stent placement and removal in the past.   PAST SURGICAL HISTORY: 1.  Left ureteral stent placement and removal.  2.  Umbilical hernia repair.  3.  Abdominal hysterectomy.   ALLERGIES TO MEDICATIONS:  ANTIHISTAMINES AND SULFA DRUGS.    CURRENT HOME MEDICATIONS:  1.  Simvastatin 40 mg by mouth daily.  2.  Omeprazole 20 mg by mouth daily.  3.  Benazepril 20 mg by mouth daily.  4.  Lasix 20 mg by mouth daily.  5.  BC Powders as needed for back pain.  6.  Metformin 1000 mg by mouth twice daily.  7.  Multivitamin 1 tablet by mouth daily.  8.   Omega 3 polyunsaturated fatty acids 1 capsule by mouth daily.  9.  Os-Cal 1 tablet by mouth twice daily.  10.  Pseudoephedrine 2 tablets once a day in the morning.  11.  Vitamin E 400 international units 1 capsule by mouth daily.  12.  Ascorbic acid 500 mg by mouth daily.   SOCIAL HISTORY:  Lives at home by herself, still works Surveyor, quantity.  No smoking or alcohol use.   FAMILY HISTORY:  Significant for diabetes, hypertension and nephrolithiasis in the family.   REVIEW OF SYSTEMS:  CONSTITUTIONAL:  No fever, fatigue or weakness.  EYES:  No blurred vision, double vision, pain, inflammation or glaucoma.  EARS, NOSE, THROAT:  No tinnitus, ear pain, hearing loss, epistaxis or discharge.  RESPIRATORY:  No cough, wheeze, hemoptysis or COPD.  CARDIOVASCULAR:  No chest pain, orthopnea, edema, arrhythmia, palpitations or syncope.  GASTROINTESTINAL:  Positive for nausea and vomiting.  No diarrhea, abdominal pain, hematemesis or melena.  GENITOURINARY:  No dysuria, hematuria, renal calculus, frequency or incontinence.  ENDOCRINE:  No polyuria, nocturia, thyroid problems, heat or cold intolerance.  HEMATOLOGY:  No anemia, easy bruising or bleeding.  SKIN:  No acne, rash or lesions.  MUSCULOSKELETAL:  No neck, back, shoulder pain, arthritis or gout.  NEUROLOGIC:  No numbness, weakness, CVA, TIA or seizures.  PSYCHOLOGICAL:  No anxiety, insomnia, depression.   PHYSICAL EXAMINATION: VITAL SIGNS:  Temperature 98.6 degrees Fahrenheit, pulse 107, respirations 24,  blood pressure 186/81, pulse ox 93% on room air.  GENERAL:  Well-built, well-nourished female lying in bed in mild distress secondary to left flank pain.  HEENT:  Normocephalic, atraumatic.  Pupils equal, round, reacting to light.  Anicteric sclerae.  Extraocular movements intact.  Oropharynx clear without erythema, mass or exudates.  NECK:  Supple.  No thyromegaly, JVD or carotid bruits.  No lymphadenopathy.  LUNGS:  Moving air  bilaterally.  No wheeze or crackles.  No use of accessory muscles for breathing.  CARDIOVASCULAR:  S1, S2, regular rate and rhythm.  No murmurs, rubs or gallops.  ABDOMEN:  Soft, nontender, nondistended.  No hepatosplenomegaly.  Mild CVA tenderness is present.  Normal bowel sounds.  EXTREMITIES:  No pedal edema.  No clubbing or cyanosis, 2+ dorsalis pedis pulses palpable bilaterally.  SKIN:  No acne, rash or lesions.  LYMPHATICS:  No cervical or inguinal lymphadenopathy.  NEUROLOGIC:  Cranial nerves II through XII are intact.  Motor strength is 5 out of 5 all 4 extremities.  Sensation is intact.  Deep tendon reflexes are 1+ symmetric both upper and lower extremities.  PSYCHOLOGICAL:  The patient is awake, alert, oriented x 3.   LABORATORY DATA:  WBC 4.8, hemoglobin 14.6, hematocrit 42.1, platelet count 145.  Sodium 138, potassium 4.1, chloride 107, bicarb 22, BUN 19, creatinine 1.1, glucose 172 and calcium of 9.7.  ALT 27, AST 27, alk phos 85, total bili 1.2.  Urinalysis with nitrite positive, leukocyte esterase 1+, WBCs 24 and 1+ bacteria.  CT of the abdomen and pelvis without contrast showing left-sided hydronephrosis secondary to left ureteropelvic junction stone with a second left pelvic stone in the lower pole and multiple stones in the left kidney with left para-ureteric stranding and perinephric stranding.   ASSESSMENT AND PLAN:  A 80 year old female with past medical history of hypertension, hyperlipidemia and diabetes, left renal stone status post stent placement last year by Dr. Jacqlyn Larsen, comes with nausea, vomiting, left flank pain and found to have left pyelonephritis.  1.  Acute left-sided pyelonephritis with left hydronephrosis secondary to obstructing ureteral stone and multiple stones in the lower pole.  Urology has been consulted.  Blood and urine cultures have been ordered and IV fluids started and Rocephin has been started.  We will keep patient nothing by mouth after midnight and likely  stent placement in a.m.  Nausea meds as needed.  2.  Hypertension.  Continue benazepril.  3.  Diabetes mellitus, on metformin.  4.  Hyperlipidemia, on statin.  5.  GI and DVT prophylaxis.   CODE STATUS:  FULL CODE.   Time spent on admission is 50 minutes.     ____________________________ Gladstone Lighter, MD rk:ea D: 11/08/2012 22:07:40 ET T: 11/08/2012 23:35:08 ET JOB#: [id number]  cc: Gladstone Lighter, MD, <Dictator> Marne A. Glori Bickers, MD Gladstone Lighter MD ELECTRONICALLY SIGNED 11/18/2012 22:16

## 2014-07-17 NOTE — Consult Note (Signed)
She had a left ureteral stent placed by Dr. Randa Spike on 8/16 for obstructive pyelonephritis.  She had a 1.1 cm left UPJ stone and a 9 mm stone in the dependent portion of the renal pelvis.  She has no complaints at present except for catheter irritation.  Urine grew Escherichia coli sensitive to current antibiotic therapy.  Impression: Obstructive pyelonephritis status post left ureteral stent placement Recommendation: Can discontinue Foley when no longer needed for output monitoring.  Would keep on at least 2 weeks of oral antibiotics.  She will need a follow-up appointment with Dr. Jacqlyn Larsen after discharge for scheduling shockwave lithotripsy.  A KUB was ordered.  Electronic Signatures: Abbie Sons (MD)  (Signed on 18-Aug-14 17:05)  Authored  Last Updated: 18-Aug-14 17:05 by Abbie Sons (MD)

## 2014-07-17 NOTE — Discharge Summary (Signed)
PATIENT NAME:  Gabriella Howe, Gabriella Howe MR#:  [id number] DATE OF BIRTH:  12-29-1934  DATE OF ADMISSION:  11/08/2012  ADMITTING PHYSICIAN:  Dr. Gladstone Lighter  DATE OF DISCHARGE:  11/12/2012  DISCHARGING PHYSICIAN:  Dr. Gladstone Lighter  PRIMARY CARE PHYSICIAN:  Dr. Loura Pardon  DISCHARGE DIAGNOSES:  1.  Sepsis.  2.  Acute obstructive pyelonephritis.  3.  E. coli urinary tract infection.  4.  Left-sided hydronephrosis secondary to stone obstruction, status post ureteral stent placement.  5.  Acute renal failure in the hospital.  6.  Hypertension.  7.  Diabetes mellitus.  8.  Gastroesophageal reflux disease.  9.  Hyperlipidemia.  10.  History of prior nephrolithiasis and left ureteral stent placement in the past.   DISCHARGE HOME MEDICATIONS:  1.  Metformin 1000 mg p.o. b.i.d.  2.  Metronidazole topical gel 0.75%, apply as needed once a day for rosacea.  3.  Simvastatin 40 mg p.o. daily.  4.  Os-Cal 500 mg with vitamin D 1 tablet p.o. b.i.d.  5.  Benazepril 20 mg p.o. daily.  6.  Omeprazole 20 mg p.o. daily.  7.  Ascorbic acid 500 mg p.o. daily.  8.  Multivitamin 1 tablet p.o. daily.  9.  Omega-3 polyunsaturated fatty acids 1 capsule p.o. daily.  10.  Vitamin E 400 international units p.o. daily.  11.  Pseudoephedrine 2 tablets once a day in the morning.  12.  Tylenol 650 mg p.o. q. 6 hours p.r.n. for pain or fever.  13.  Lasix 20 mg p.o. daily p.r.n.  14.  Levaquin 500 mg p.o. daily for 14 days.   DISCHARGE DIET:  Low-sodium ADA 1800 diet.   DISCHARGE ACTIVITY:  As tolerated.    FOLLOWUP INSTRUCTIONS: 1.  Follow up with Dr. Jacqlyn Larsen in two weeks.  2.  PCP followup in 2 to 3 weeks.   LABS AND IMAGING STUDIES PRIOR TO DISCHARGE: Sodium 142, potassium 3.7, chloride 111, bicarb 26, BUN 13, creatinine 0.79, glucose 139, calcium of 9.1. WBC normalized to 10.5. KUB done prior to discharge on 11/12/2012 showing left-sided nephrolithiasis and double-J ureteral stent on the left side  present, which is patent. WBC went as high as 19.5 during this hospital course. Hemoglobin is 12.0, hematocrit 34.7, platelet count is 102. Urine cultures on initial admission were growing E. coli sensitive to Levaquin. CT of the abdomen and pelvis on admission showing left hydronephrosis secondary to left ureteropelvic junction stone versus second left pelvic stone in the lower pole and small stones in the left kidney, with left periureteric and perinephric stranding seen. Blood cultures are negative.   BRIEF HOSPITAL COURSE:  Gabriella Howe is a 80 year old, very pleasant Caucasian female, with past medical history significant for hypertension, diabetes, and prior history of renal stones on the left side, with prior left ureteral stent placement by Dr. Jacqlyn Larsen last year, comes to the hospital secondary to left flank pain and fevers.   1.  Sepsis. The patient had a high fever of 103 after admission, with a white count as high as 19.5 and also associated with transient altered mental status. The cause for sepsis is acute left-sided obstructive pyelonephritis. She was seen by Urologist in the hospital, Dr. Randa Spike was the covering urologist over the weekend, who had taken the patient to OR and had left ureteral stent placed. She was broad-spectrum covered by Zosyn on admission. Cultures were growing E. coli sensitive to multiple oral antibiotics, so he is being changed over to Levaquin. Per urology doctor's recommendations,  she will be given 2 weeks supply of oral antibiotics, and she will follow up with Dr. Jacqlyn Larsen in his outpatient office in the next couple of weeks. The patient also required CCU admission for stent placement secondary to hypotension, and also required pressors. Her blood pressure has been doing very well at this point, and she has improved significantly since admission and is being discharged home.  KUB done prior to discharge shows patent stent, no dislodging of the stent.  2.  Acute renal failure  secondary to acute tubular necrosis from sepsis likely. Was given IV fluids, antibiotics. Nephrotoxins were held. Her benazepril and metformin were held in the hospital. Renal function improved, and is back to baseline at the time of discharge.   3. Hypertension. Benazepril was held in the hospital due to hypotension and renal failure. However, blood pressure improved and is being restarted at the time of discharge.   4.  Diabetes mellitus. Metformin is being restarted at the time of discharge.   6.  Her overall course has been otherwise uneventful in the hospital.   DISCHARGE CONDITION:   Stable.   DISCHARGE DISPOSITION:  Home.   Time spent on discharge:   45 minutes.      ____________________________ Gladstone Lighter, MD rk:mr D: 11/12/2012 14:31:16 ET T: 11/12/2012 19:59:14 ET JOB#: [id number]  cc: Gladstone Lighter, MD, <Dictator> Marne A. Glori Bickers, MD  Gladstone Lighter MD ELECTRONICALLY SIGNED 11/18/2012 22:17

## 2014-07-17 NOTE — Op Note (Signed)
PATIENT NAME:  Gabriella Howe, Gabriella Howe MR#:  [id number] DATE OF BIRTH:  Mar 05, 1935  DATE OF PROCEDURE:  11/09/2012  ATTENDING PHYSICIAN: Burnard Hawthorne, MD  PREOPERATIVE DIAGNOSIS: Ureteropelvic junction stone.  POSTOPERATIVE DIAGNOSIS: Ureteropelvic junction stone.  PROCEDURE: Cystoscopy and left ureteral stent placement. I was present for the entirety of the procedure.  ANESTHESIA: General.  ESTIMATED BLOOD LOSS: 5 mL.  COMPLICATIONS: None.  SPECIMEN: Urine culture from the left collecting system.  DRAINS: A 24 cm x 6-French left ureteral stent and an 18-French Foley catheter.  DESCRIPTION AND MEDICAL NECESSITY: This is a 80 year old female with a history of flank pain and nephrolithiasis in the past, who presented to the Emergency Room with flank pain for approximately 24 hours. She was found to have a urinalysis consistent with a UTI. Her vital signs were stable and the decision was made to admit the patient, start her on antibiotics and proceed with stent placement.  DESCRIPTION OF PROCEDURE: The patient was brought to the Operating Room and placed on the operating table in the supine position. General anesthesia was administered. She was then placed in the dorsal lithotomy position. Her genitals were prepped and draped in the usual sterile fashion. We began the procedure by placing a 21-French cystoscope into the bladder. The bladder was surveyed in its entirety. There was no evidence of any malignancies or abnormalities in the bladder. The ureteral orifices were intact in the orthotopic position. A Sensor wire was used to access the left ureteral orifice, and the Sensor wire was advanced into the renal pelvis. At this point, an open-ended catheter was advanced into the renal pelvis and Sensor wire was withdrawn. We then took a urine culture directly from the left renal pelvis in order to assist in treatment of the patient's urinary infection. At this point, the Sensor wire was replaced.  The ureteral catheter was withdrawn, and a 24 cm x 6-French left ureteral stent was advanced over the Sensor wire into the collecting system under direct vision. The wire was then withdrawn, and the stent was in excellent position in the collecting system fluoroscopically with a good coil in the bladder under direct vision. At this point, the bladder was emptied. An 18-French Foley catheter was placed. The patient was awakened, brought to the PACU in stable condition.  DISPOSITION: PACU.  CONDITION: Stable.   POSTOPERATIVE PLAN: The patient will be observed in the CCU due to hypotension after manipulation of the collecting system. We will await cultures and sensitivities to come back, and she will continue on broad-spectrum antibiotics in the meantime.   ____________________________ Burnard Hawthorne, MD [id number] D: 11/09/2012 15:22:06 ET T: 11/09/2012 16:42:19 ET JOB#: [id number]  cc: Burnard Hawthorne, MD, <Dictator> Burnard Hawthorne MD ELECTRONICALLY SIGNED 12/29/2012 21:41

## 2014-07-17 NOTE — H&P (Signed)
Subjective/Chief Complaint Left Flank pain   History of Present Illness This is a 80 yo female with a history of nephrolithiasis who presents with new onset left flank pain.  She has had some nausea and vomiting.  No fevers.  Endorses chills.  Her pain is well controlled currently.  No hematuria.  Urine is cloudy.  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  Her primary urologist is Dr. Jacqlyn Larsen.   Past Med/Surgical Hx:  Rosacea:   arthritis:   kidney stones:   Gastric Reflux:   Hypercholesterolemia:   Hypertension:   Diabetes:   kidney stent:   hysterectomy:   ALLERGIES:  Antihistamines: Other  Sulfa drugs: Swelling, Rash  Rofecoxib: Swelling, Rash  Tetanus-Diphtheria Toxoids, Adult (Td): N/V/Diarrhea  HOME MEDICATIONS: Medication Instructions Status  metformin 1000 mg oral tablet 1 tab(s) orally 2 times a day with a meal.  Active  metroNIDAZOLE 0.75% topical gel Apply to affected area on face topically once a day for rosacea.  **brand name metrogel** Active  simvastatin 40 mg oral tablet 1 tab(s) orally once a day (at bedtime).   Active  Os-Cal 500 with D 1 tab(s) orally 2 times a day Active  benazepril 20 mg oral tablet 1 tab(s) orally once a day (in the morning) Active  omeprazole 20 mg oral delayed release capsule 1 cap(s) orally once a day (in the morning) Active  ascorbic acid 500 mg oral tablet 1 tab(s) orally once a day (in the morning) Active  BC 845 mg-65 mg oral powder for reconstitution 1 packet(s) orally once a day, As Needed Active  furosemide 40 mg oral tablet 0.5 tab (21m) orally once a day (in the morning). Active  ibuprofen 200 mg oral tablet 2 tabs (4030m orally every 4 hours as needed for pain. Active  multivitamin 1 cap(s) orally once a day (in the morning) Active  omega-3 polyunsaturated fatty acids oral capsule 1 cap(s) orally once a day (in the morning) Active  vitamin E 400 intl units oral capsule 1  cap(s) orally once a day (in the morning) Active  pseudoephedrine 2 tab(s) orally once a day (in the morning) Active   Family and Social History:  Family History Non-Contributory   Social History negative Illicit drugs   Review of Systems:  Subjective/Chief Complaint Left flank pain   Fever/Chills Yes  Chills, no fever   Cough No   Sputum No   Abdominal Pain Yes  Flank pain   Diarrhea No   Constipation No   Nausea/Vomiting Yes   SOB/DOE No   Chest Pain No   Dysuria Yes   Tolerating PT Yes   Tolerating Diet Yes   Medications/Allergies Reviewed Medications/Allergies reviewed   Physical Exam:  GEN no acute distress   HEENT PERRL   NECK supple   RESP normal resp effort   CARD regular rate   ABD positive Flank Tenderness  no hernia  soft   GU no superpubic tenderness   EXTR negative cyanosis/clubbing   SKIN normal to palpation   NEURO cranial nerves intact   PSYCH alert   Lab Results: Hepatic:  15-Aug-14 16:06   Bilirubin, Total  1.2  Bilirubin, Direct 0.2 (Result(s) reported on 08 Nov 2012 at 08:01PM.)  Alkaline Phosphatase 85  SGPT (ALT) 27  SGOT (AST) 27  Total Protein, Serum 7.2  Albumin, Serum 3.9  Routine Chem:  15-Aug-14 16:06   Glucose, Serum  172  BUN  19  Creatinine (  comp) 1.13  Sodium, Serum 138  Potassium, Serum 4.1  Chloride, Serum 107  CO2, Serum 22  Calcium (Total), Serum 9.7  Anion Gap 9  Osmolality (calc) 282  eGFR (African American)  55  eGFR (Non-African American)  48 (eGFR values <70m/min/1.73 m2 may be an indication of chronic kidney disease (CKD). Calculated eGFR is useful in patients with stable renal function. The eGFR calculation will not be reliable in acutely ill patients when serum creatinine is changing rapidly. It is not useful in  patients on dialysis. The eGFR calculation may not be applicable to patients at the low and high extremes of body sizes, pregnant women, and vegetarians.)  Routine UA:   15-Aug-14 17:19   Color (UA) Yellow  Clarity (UA) Hazy  Glucose (UA) 50 mg/dL  Bilirubin (UA) Negative  Ketones (UA) Trace  Specific Gravity (UA) 1.017  Blood (UA) Negative  pH (UA) 5.0  Protein (UA) Negative  Nitrite (UA) Positive  Leukocyte Esterase (UA) 1+ (Result(s) reported on 08 Nov 2012 at 05:57PM.)  RBC (UA) 3 /HPF  WBC (UA) 24 /HPF  Bacteria (UA) 1+  Epithelial Cells (UA) 1 /HPF (Result(s) reported on 08 Nov 2012 at 05:57PM.)  Routine Hem:  15-Aug-14 16:06   WBC (CBC) 4.8  RBC (CBC) 4.69  Hemoglobin (CBC) 14.6  Hematocrit (CBC) 42.1  Platelet Count (CBC)  145 (Result(s) reported on 08 Nov 2012 at 04:26PM.)  MCV 90  MCH 31.1  MCHC 34.7  RDW 13.2   Radiology Results: CT:    15-Aug-14 18:32, CT Abdomen and Pelvis Without Contrast  CT Abdomen and Pelvis Without Contrast  REASON FOR EXAM:    (1) left flank pain h/o kidney stones; (2) left flank   pain h/o kidney stones  COMMENTS:   May transport without cardiac monitor    PROCEDURE: CT  - CT ABDOMEN AND PELVIS W0  - Nov 08 2012  6:32PM     RESULT: CT of the abdomen and pelvis without contrast is reconstructed at   3 mm slice thickness in the axial plane. The patient has a previous   similar study also through the emergency department from 05/19/2011. The   patient has a known history of kidney stones. There is moderate left   hydronephrosis secondary to left renal pelvic stone in the area of the   ureteropelvic junction the somewhat irregular margins measuring up to   1.13 cm. A second somewhat smaller stone is seen in the left renal   pelvis. Has the patient had recent lithotripsy? The second stone measures   9.0 mm. Smaller stones are seen in the lower pole the left kidney.     Perinephric stranding is present on the left. The right kidney is   unremarkable. No bladder calculi are evident a read the otherabdominal   viscera appear to be grossly normal for noncontrast exam.    IMPRESSION:   1. Left  hydronephrosis secondary to a left ureteropelvic junction stone   with a second left pelvic stone an lower pole small stones in the left   kidney with leftperiureteric stranding and perinephric stranding similar   to that seen on the previous study.    Dictation Site: 6        Verified By: GSundra Aland M.D., MD    Assessment/Admission Diagnosis Left UPJ stone   Plan -Rocephin administered in ED.  Previous culture with E.coli sensitive. Follow up urine culture -NPO -Plan for OR tomorrow for cystoscopy, left retrograde pyelography, left ureteral  stent placement.  Informed consent obtained.  Reviewed risks, benefits and alternatives and patient agrees to proceed.   Electronic Signatures: Burnard Hawthorne (MD)  (Signed 15-Aug-14 22:08)  Authored: CHIEF COMPLAINT and HISTORY, PAST MEDICAL/SURGIAL HISTORY, ALLERGIES, HOME MEDICATIONS, FAMILY AND SOCIAL HISTORY, REVIEW OF SYSTEMS, PHYSICAL EXAM, LABS, Radiology, ASSESSMENT AND PLAN   Last Updated: 15-Aug-14 22:08 by Burnard Hawthorne (MD)

## 2014-07-18 NOTE — Op Note (Signed)
PATIENT NAME:  Gabriella Howe, Gabriella Howe MR#:  [id number] DATE OF BIRTH:  11-19-1934  DATE OF PROCEDURE:  07/22/2013  PREOPERATIVE DIAGNOSIS: Visually significant cataract of the right eye.   POSTOPERATIVE DIAGNOSIS: Visually significant cataract of the right eye.   OPERATIVE PROCEDURE: Cataract extraction by phacoemulsification with implant of intraocular lens to right eye.   SURGEON: Birder Robson, MD.   ANESTHESIA:  1. Managed anesthesia care.  2. Topical tetracaine drops followed by 2% Xylocaine jelly applied in the preoperative holding area.   COMPLICATIONS: None.   TECHNIQUE:  Stop and chop.   DESCRIPTION OF PROCEDURE: The patient was examined and consented in the preoperative holding area where the aforementioned topical anesthesia was applied to the right eye.  The patient was brought back to the Operating Room where he was sat upright on the gurney and given a target to fixate upon while the eye was marked at the 3:00 and 9:00 position.  The patient was then reclined on the operating table, and lidocaine jelly was applied to the right eye.  The eye was prepped and draped in the usual sterile ophthalmic fashion and a lid speculum was placed. A paracentesis was created with the side port blade and the anterior chamber was filled with viscoelastic. A near clear corneal incision was performed with the steel keratome. A continuous curvilinear capsulorrhexis was performed with a cystotome followed by the capsulorrhexis forceps. Hydrodissection and hydrodelineation were carried out with BSS on a blunt cannula. The lens was removed in a stop and chop technique and the remaining cortical material was removed with the irrigation-aspiration handpiece. The eye was inflated with viscoelastic and the Tecnis Toric  ZCT225, 15.0-diopter lens, serial number [id number] was placed in the eye and rotated to within a few degrees of the predetermined orientation at __105_________ degrees.  The remaining viscoelastic  was removed from the eye.  The Sinskey hook was used to rotate the toric lens into its final resting place at 90 degrees.  0.1 mL of cefuroxime concentration 10 mg/mL was placed in the anterior chamber. The eye was inflated to a physiologic pressure and found to be watertight.  The eye was dressed with Vigamox. The patient was given protective glasses to wear throughout the day and a shield with which to sleep tonight. The patient was also given drops with which to begin a drop regimen today and will follow-up with me in one day.   ____________________________ Gabriella Howe. Margel Joens, MD wlp:dmm D: 07/22/2013 21:23:34 ET T: 07/22/2013 21:56:29 ET JOB#: [id number]  cc: Gabriella Mello L. Jorja Empie, MD, <Dictator> Gabriella Howe Aharon Carriere MD ELECTRONICALLY SIGNED 07/23/2013 14:07

## 2014-07-18 NOTE — Op Note (Signed)
PATIENT NAME:  Gabriella Howe, Gabriella Howe MR#:  [id number] DATE OF BIRTH:  1935/03/17  DATE OF PROCEDURE:  08/05/2013  PREOPERATIVE DIAGNOSIS: Visually significant cataract of the left eye.   POSTOPERATIVE DIAGNOSIS: Visually significant cataract of the left eye.   OPERATIVE PROCEDURE: Cataract extraction by phacoemulsification with implant of intraocular lens to left eye.   SURGEON: Birder Robson, MD.   ANESTHESIA:  1. Managed anesthesia care.  2. Topical tetracaine drops followed by 2% Xylocaine jelly applied in the preoperative holding area.   COMPLICATIONS: None.   TECHNIQUE:  Stop and chop.  DESCRIPTION OF PROCEDURE: The patient was examined and consented in the preoperative holding area where the aforementioned topical anesthesia was applied to the left eye.  The patient was brought back to the Operating Room where he was sat upright on the gurney and given a target to fixate upon while the eye was marked at the 3:00 and 9:00 position.  The patient was then reclined on the operating table, and lidocaine jelly was applied to the left eye.  The eye was prepped and draped in the usual sterile ophthalmic fashion and a lid speculum was placed. A paracentesis was created with the side port blade and the anterior chamber was filled with viscoelastic. A near clear corneal incision was performed with the steel keratome. A continuous curvilinear capsulorrhexis was performed with a cystotome followed by the capsulorrhexis forceps. Hydrodissection and hydrodelineation were carried out with BSS on a blunt cannula. The lens was removed in a stop and chop technique and the remaining cortical material was removed with the irrigation-aspiration handpiece. The eye was inflated with viscoelastic and the Tecnis Toric ZCT225, 14.5-diopter lens, serial number [id number] was placed in the eye and rotated to within a few degrees of the predetermined orientation at 92 degrees.  The remaining viscoelastic was removed from  the eye.  The Sinskey hook was used to rotate the toric lens into its final resting place at 92 degrees.  0.1 mL of cefuroxime concentration 10 mg/mL was placed in the anterior chamber. The eye was inflated to a physiologic pressure and found to be watertight.  The eye was dressed with Vigamox. The patient was given protective glasses to wear throughout the day and a shield with which to sleep tonight. The patient was also given drops with which to begin a drop regimen today and will follow-up with me in one day.      ____________________________ Livingston Diones. Talik Casique, MD wlp:dmm D: 08/05/2013 22:01:07 ET T: 08/05/2013 22:18:34 ET JOB#: [id number]  cc: Waunita Sandstrom L. Caress Reffitt, MD, <Dictator> Livingston Diones Gredmarie Delange MD ELECTRONICALLY SIGNED 08/07/2013 13:29

## 2014-07-19 NOTE — Op Note (Signed)
PATIENT NAME:  Gabriella Howe, Gabriella Howe MR#:  [id number] DATE OF BIRTH:  08-09-1934  DATE OF PROCEDURE:  05/23/2011  PREOPERATIVE DIAGNOSIS: Left ureterolithiasis, hydronephrosis, sepsis.   POSTOPERATIVE DIAGNOSIS: Left ureterolithiasis, hydronephrosis, sepsis.   PROCEDURE: Cystoscopy, left double-J ureteral stent placement.   SURGEON: Edrick Oh, M.D.   ANESTHESIA: Laryngeal mask airway anesthesia.   INDICATIONS: The patient is a 80 year old white female who presented to the Emergency Room recently with left-sided flank pain. A CT scan demonstrated a 4-mm proximal left ureteral calculus with some obstruction. She had blood cultures and urine cultures obtained. She was discharged without antibiotic therapy. She returned to the Emergency Room with high fever. She was noted to have positive blood and urine cultures for E. coli. She was admitted for sepsis. She was febrile at the time of her initial presentation. She promptly defervesced with antibiotic therapy. She presents for cystoscopy and stent placement.   DESCRIPTION OF PROCEDURE: After informed consent was obtained, the patient was taken to the operating room and placed in the dorsal lithotomy position under laryngeal mask airway anesthesia. The patient was then prepped and draped in the usual standard fashion. A 22 French rigid cystoscope was introduced into the urethra under direct vision with no urethral abnormalities noted. Upon entering the bladder, the mucosa was inspected in its entirety. Multiple areas of cystitis cystica and cystitis glandularis were noted throughout the bladder. Bilateral ureteral orifices were well visualized with no lesions noted. A flexible tip Glidewire was introduced into the left ureteral orifice. Some resistance was met just inside of the opening. No stone was visualized. The guidewire was successfully navigated into the upper pole collecting system. A 6 French x 24-cm double-J ureteral stent was advanced over the  guidewire. Upon withdrawal of the guidewire no significant curl of the stent was noted. Multiple attempts were made at manipulating the stent without adequate curl. The guidewire was then completely removed. Good curl was noted within the bladder. Grasping forceps were utilized to try to manipulate the stent further to achieve an adequate curl. This was unsuccessful. The stent was subsequently removed. The stent was noted to have no curl on its upper end. The flexible portion of the stent was not noted. This is consistent with a defective stent. The decision was made, however, to proceed with a retrograde pyelogram to confirm the anatomy and placement of the guidewire into the collecting system. The guidewire was replaced back into the urinary bladder. A 6 French open-ended catheter was inserted over the guidewire. It was inserted into the ureteral orifice approximately 5 cm. The guidewire was removed. A retrograde pyelogram was performed which demonstrated filling of the entire ureter and collecting system. There was no current hydronephrosis noted. The contrast was noted to drain promptly upon withdrawal of the open-ended catheter. No definitive filling defect consistent with a stent was appreciated. We elected to replace a stent. The guidewire was replaced through the open-ended catheter. The open-ended catheter was removed. A new 6 French x 24-cm double-J ureteral stent was advanced over the guidewire. Upon withdrawal of the guidewire, adequate curl was noted within the renal pelvis. Adequate curl was also noted within the urinary bladder. The bladder was drained. The cystoscope was removed. The patient was returned to the supine position and awakened from laryngeal mask airway anesthesia. She was taken to the recovery room in stable condition. There were no problems or complications. The patient tolerated the procedure well.     ____________________________ Denice Bors. Jacqlyn Larsen, MD bsc:bjt D: 05/24/2011  08:15:59  ET T: 05/24/2011 10:06:41 ET JOB#: [id number]  cc: Denice Bors. Jacqlyn Larsen, MD, <Dictator> Denice Bors Brees Hounshell MD ELECTRONICALLY SIGNED 05/30/2011 7:18

## 2014-07-19 NOTE — Discharge Summary (Signed)
PATIENT NAME:  Gabriella Howe, Gabriella Howe MR#:  [id number] DATE OF BIRTH:  30-Mar-1934  DATE OF ADMISSION:  05/22/2011 DATE OF DISCHARGE:  05/24/2011  DIAGNOSES:  1. Sepsis due to Escherichia coli bacteremia.  2. Left ureteral stone status post cystoscopy and stent placement. 3. Hypertension. 4. Diabetes. 5. Hyperlipidemia. 6. Gastroesophageal reflux disease.   DISPOSITION: The patient is being discharged home.   FOLLOW-UP: Follow-up with Dr. Jacqlyn Larsen and primary care physician, Dr. Glori Bickers, in 1 to 2 weeks after discharge.   DIET: Low sodium, 1800 calorie ADA diet.   ACTIVITY: As tolerated.   DISCHARGE MEDICATIONS:  1. Zofran ODT 4 mg q.6 hours p.r.n. nausea, vomiting. 2. Cipro 750 mg b.i.d. for 14 days.  3. Vitamin C 500 mg daily.  4. Benazepril 20 mg daily.  5. Metformin 1000 mg b.i.d.  6. Flagyl 0.75% topical gel apply to affected area once a day for rosacea.  7. Multivitamin 1 tablet daily.  8. Omega-3 polyunsaturated fatty acid 1 tablet once a day.  9. Omeprazole 20 mg daily.  10. Simvastatin 40 mg daily.  11. Os-Cal 500 with Vitamin D b.i.d.   CONSULTATION: Urology consultation with Dr. Jacqlyn Larsen   LABORATORY, DIAGNOSTIC, AND RADIOLOGICAL DATA: Blood and urine cultures drawn on February 22nd showed Escherichia coli. Blood cultures drawn on February 25th showed no growth in 48 hours. White count 12.6 on admission, 7.8 by the time discharge. Hemoglobin 13.1 on admission, 11.6 by the time of discharge. Platelet count normal. Normal renal function. Hemoglobin A1c 7.2. Normal electrolytes. Normal LFTs.   HOSPITAL COURSE: The patient is a 80 year old female with past medical history of diabetes, hypertension, hyperlipidemia, and gastroesophageal reflux disease who initially presented to the ER with complaints of abdominal pain, nausea, and vomiting and she was diagnosed with a ureteral stone and sent home. Subsequently she returned with nausea, vomiting, and fever. Her urine culture from 05/19/2011  was growing Escherichia coli, therefore, she was admitted to the hospital and started on empiric antibiotics. Repeat blood cultures were drawn on 05/22/2011 which have been negative so far. A Urology consultation with Dr. Jacqlyn Larsen was obtained and the patient underwent cystoscopy and stent placement on 05/23/2011. KUB was done on the day of discharge which showed the stent but no evidence of stone so there is a possibility that the patient may have passed a stone. The patient's diabetes remained well controlled during the hospitalization. Her hemoglobin A1c is 7.2. Initially the patient's hypertensive medications were held in view of sepsis. However, at the time of discharge her Lotensin has been restarted. She is being discharged home in a stable condition.   TIME SPENT: 45 minutes.  ____________________________ Cherre Huger, MD sp:drc D: 05/24/2011 17:12:12 ET T: 05/25/2011 08:31:31 ET JOB#: [id number]  cc: Cherre Huger, MD, <Dictator> Denice Bors. Jacqlyn Larsen, MD Wynelle Fanny Glori Bickers, MD Cherre Huger MD ELECTRONICALLY SIGNED 05/25/2011 15:47

## 2014-07-19 NOTE — Consult Note (Signed)
Patient seen, chart reviewed, films reviewed, note dictated. LEFT ureterolithiasis, sepsis, renal colic Given the current findings, she technically should have been admitted at the time of her initial presentation to the emergency room.  She is overall very lucky that the sepsis did not progress.  She did have a fever earlier this morning.  She is currently afebrile.  She is having minimal pain and discomfort.  She states she overall feels better than she was previous.  She should have resolution of the obstruction.  We have therefore elected to proceed with cystoscopy and LEFT stent placement.  Risks and benefits have been discussed in detail.  She has been consented for the procedure.  She agrees to proceed.  This will be scheduled as an add-on tomorrow morning.  If she develops any high fever or evidence progressive sepsis in the meantime, more urgent intervention would then be indicated.  She will need an eventual 24 hour urine metabolic stone evaluation.  She will also need an eventual definitive treatment for the stone..  There is a slight chance that it may pass spontaneously after stent placement.  Electronic Signatures: Murrell Redden (MD)  (Signed on 25-Feb-13 21:15)  Authored  Last Updated: 25-Feb-13 21:15 by Murrell Redden (MD)

## 2014-07-19 NOTE — Consult Note (Signed)
PATIENT NAME:  Gabriella Howe, Gabriella Howe MR#:  [id number] DATE OF BIRTH:  09-07-1934  DATE OF CONSULTATION:  05/22/2011  REFERRING PHYSICIAN:   CONSULTING PHYSICIAN:  Denice Bors. Jacqlyn Larsen, MD  HISTORY: Mrs. Calaway is a 80 year old female with no prior history of nephrolithiasis. She has been experiencing some intermittent left-sided flank pain for approximately two weeks. She presented to the Emergency Room last Friday. A CT scan was obtained demonstrating a 4 mm proximal left ureteral calculus with partial obstruction. She had urine and blood cultures obtained. She was discharged to home with pain medication but no antibiotic. The patient had subsequent improvement in the flank discomfort. She developed some nausea and a fever to 101. She returned back to the Emergency Room. Her cultures were noted to demonstrate Escherichia coli in both the blood and urine. She is admitted for IV hydration, IV antibiotic therapy, and treatment for her stone. Her current white blood cell count is only mildly elevated at 12.6. Her serum creatinine is excellent at 1.1. She is currently afebrile. The findings have been discussed with Mrs. Welborn. The need for proceeding with unblocking the ureter was discussed. Risks and benefits as well as treatment options were discussed in detail. We have elected to proceed with cystoscopy and stent placement to allow decompression and recovery from the infection before proceeding with definitive treatment for the stones. She was also noted to have several stones in the lower pole portion of the left kidney. These findings were also discussed.   PAST MEDICAL HISTORY:  1. Hypertension.  2. Diabetes mellitus.  3. Hyperlipidemia.  4. Gastroesophageal reflux.   PAST SURGICAL HISTORY:  1. Umbilical hernia repair.  2. Total abdominal hysterectomy.   ALLERGIES: Antihistamines and sulfa.   SOCIAL HISTORY: Noncontributory for tobacco, alcohol, or drugs.     MEDICATIONS ON ADMISSION:   1. Simvastatin 40 mg daily.  2. Omeprazole 20 mg daily.  3. Metformin 1 gram p.o. twice daily.  4. Lasix 40 mg daily.  5. Benazepril 20 mg daily.  6. Tramadol as needed.   PHYSICAL EXAMINATION:  VITAL SIGNS: Currently afebrile, vital signs stable.   HEENT: Within normal limits.   CHEST: Clear to auscultation bilaterally.   CARDIOVASCULAR: Regular rate and rhythm.   ABDOMEN: Soft, nontender, nondistended. No appreciable CVA tenderness at this time.   EXTREMITIES: Free range of motion times four.  NEUROLOGIC: Motor and sensory grossly intact.   ASSESSMENT:  1. Left ureterolithiasis with obstruction.  2. Sepsis.  3. Renal colic.   RECOMMENDATION: The patient is aware of the potential risks and benefits. She has elected to proceed with cystoscopy and stent placement. We will perform this on a more elective basis tomorrow morning. If she does develop any progression of the sepsis-type symptoms, more urgent intervention would then be indicated. She will need eventual definitive treatment for the ureteral stone. She will also need additional therapy likely for the remaining renal calculi.       She will also need eventual 60-FUXN urine metabolic stone evaluation to determine the etiology of stone formation.    ____________________________ Denice Bors Jacqlyn Larsen, MD bsc:bjt D: 05/22/2011 21:31:56 ET T: 05/23/2011 10:57:24 ET JOB#: [id number]  cc: Denice Bors. Jacqlyn Larsen, MD, <Dictator> Denice Bors Adilee Lemme MD ELECTRONICALLY SIGNED 05/30/2011 7:17

## 2014-07-19 NOTE — Consult Note (Signed)
Pt doing better.UOPstent irritation with frequencystone more distalobstruction noted on retrograde yesterdaychance of stone passage with stentget KUB todayto discharge when ready from medical standpointschedule F/U base on KUB  Electronic Signatures: Murrell Redden (MD)  (Signed on 27-Feb-13 07:40)  Authored  Last Updated: 27-Feb-13 07:40 by Murrell Redden (MD)

## 2014-07-19 NOTE — Consult Note (Signed)
AF/VSSUOPproceed with Cysto/Left Stent Placement as scheduled for today  Electronic Signatures: Murrell Redden (MD)  (Signed on 26-Feb-13 07:40)  Authored  Last Updated: 26-Feb-13 07:40 by Murrell Redden (MD)

## 2014-07-19 NOTE — H&P (Signed)
PATIENT NAME:  Gabriella Howe, ATHA MR#:  [id number] DATE OF BIRTH:  12-03-1934  DATE OF ADMISSION:  05/22/2011  PRIMARY CARE PHYSICIAN: Loura Pardon, MD  EMERGENCY ROOM PHYSICIAN: Lenise Arena, MD  CHIEF COMPLAINT: Generalized weakness.   HISTORY OF PRESENT ILLNESS: The patient is a 80 year old female with hypertension, diabetes, and hyperlipidemia who came in because of generalized weakness and fever. The patient was seen on Friday in the ER and was found to have a 4 mm calculus in the left ureter. The patient was discharged home with pain medications. The patient went home. The patient's pain has subsided, but because of nausea and decreased p.o. intake the patient was feeling very weak and this morning she had a fever of 101, then she came back. She denies any pain in the groin area, denies abdominal pain, has nausea and generalized weakness and unable to walk. The patient denies any chest pain, no trouble breathing, and no trouble urinating. She did not take medications also for the last two days because of nausea. The patient was found to have positive blood cultures from the last visit, on 05/19/2011, with Escherichia coli in the blood and also urine. I was asked to admit for sepsis with Escherichia coli from urine. The patient was seen at the bedside. She has some nausea but denies any complaints.   PAST MEDICAL HISTORY:  1. Hypertension.  2. Diabetes.  3. Hyperlipidemia.  4. Gastroesophageal reflux disease.   ALLERGIES: Antihistamines  and sulfa.  SOCIAL HISTORY: No smoking. No drinking. Lives alone.   PAST SURGICAL HISTORY:  1. Total abdominal hysterectomy. 2. Umbilical hernia repair.   FAMILY HISTORY: Significant for diabetes, hypertension, and also strong family history of kidney stones in all the family members, in sisters and brothers.  MEDICATIONS: 1. Simvastatin 40 mg daily. 2. Omeprazole 20 mg daily. 3. Metformin 1 gram p.o. twice a day. 4. Lasix 40 mg  daily. 5. Benazepril 20 mg daily. 6. Tramadol.  REVIEW OF SYSTEMS: CONSTITUTIONAL: Fever, fatigue, and generalized weakness. EYES: No blurred vision. ENT: No tinnitus. No ear pain. No epistaxis. No difficulty swallowing. RESPIRATORY: Complains of dry cough since last two days, but no trouble breathing. No wheezing. CARDIOVASCULAR: No chest pain. No orthopnea. No palpitations. GASTROINTESTINAL: Nausea. No vomiting. No abdominal pain. Has gastroesophageal reflux disease. GENITOURINARY: The patient has no dysuria. ENDOCRINE: No polyuria or nocturia. INTEGUMENT: No skin rashes. MUSCULOSKELETAL: Has arthritis pains on and off. NEUROLOGIC: No numbness. No history of strokes. PSYCH: No anxiety or insomnia.   PHYSICAL EXAMINATION:   VITAL SIGNS: Temperature 98.2 in the ER, pulse 86, respirations 18, blood pressure 149/80, and saturations 98% on room air.   GENERAL: She is alert, awake, and oriented.   HEENT: Head atraumatic, normocephalic. Pupils are equally reacting to light. No conjunctivitis. Hearing is intact. No pharyngeal erythema. Mucosa is dry.  NECK: Thyroid nontender, not enlarged. No lymphadenopathy. No JVD. No carotid bruits.   LUNGS: Clear to auscultation. No wheeze. No rales.   ABDOMEN: Soft, nontender, and nondistended. Bowel sounds present. No CVA tenderness. No hernias.   HEART:  S1 and S2 regular. No murmurs.   MUSCULOSKELETAL: Strength 5/5 in upper and lower extremities.   SKIN: No skin rashes.   NEUROLOGIC: Cranial nerves intact. No dysarthria. Deep tendon reflexes 2+ bilaterally. Sensations are intact.   PSYCH: Oriented to time, place, and person.  LABS/STUDIES: WBC 12.6, hemoglobin 13.1, hematocrit 38.1, platelets 152.   Electrolytes: Sodium 137, potassium 3.7, chloride 102, bicarbonate 24, BUN 23, creatinine  1, glucose 164.   Urinalysis showed hazy urine with 1+ leukocyte esterase and 1+ bacteria.   Urine cultures from 05/19/2011 showed Escherichia coli sensitive to  Rocephin.   Abdominal CAT scan on 05/19/2011 showed a 4 mm calculus in the proximal left ureter and small soft tissue density in the right perirectal fat which is nonspecific and mildly enlarged lymph node that is a differential possibility. They recommend comparison with previous scans and also evaluation with colonoscopy to evaluate for an underlying cause. The patient has several 5 mm calculi in the inferior pole of the left kidney and a 4 mm calculus in the left ureter.   Blood cultures showed Escherichia coli, from 05/19/2011, sensitive to Rocephin.  ASSESSMENT AND PLAN:  54. A 80 year old female with sepsis secondary to Escherichia coli in the urine, has now Escherichia coli bacteremia. Admit to hospitalist service, on telemetry. Continue hydration with normal saline at 100 mL/hour and check BMP tomorrow. Broad-spectrum antibiotics with Rocephin 1 gram IV daily. Repeat blood cultures in two days. The patient will be getting Tylenol as needed for pain and fever. Dr. Jacqlyn Larsen, urologist, has been informed by the ER physician because of active sepsis. No intervention is planned. The patient may need stenting once the infection resolves for ureteral stone.  2. High blood pressure. Hold the Lasix and lisinopril due to sepsis and avoiding renal failure. Monitor the patient on telemetry and give p.r.n. hydralazine as needed for blood pressure. 3. Diabetes mellitus type-2. The patient's p.o. intake has been poor for the last two days, but she  said she wants to eat today and we will start metformin.  4. Hyperlipidemia. Continue statins.  5. Gastroesophageal reflux disease. Continue PPIs.   Her condition is stable. I discussed the plan with the patient at bedside.   TIME SPENT: Approximately 60 minutes.  ____________________________ Epifanio Lesches, MD sk:slb D: 05/22/2011 10:52:48 ET T: 05/22/2011 11:14:28 ET JOB#: [id number]  cc: Epifanio Lesches, MD, <Dictator> Marne A. Glori Bickers, MD Epifanio Lesches MD ELECTRONICALLY SIGNED 05/24/2011 8:17

## 2014-07-23 DIAGNOSIS — M1711 Unilateral primary osteoarthritis, right knee: Secondary | ICD-10-CM | POA: Diagnosis not present

## 2014-07-23 DIAGNOSIS — M25561 Pain in right knee: Secondary | ICD-10-CM | POA: Diagnosis not present

## 2014-07-23 DIAGNOSIS — G8929 Other chronic pain: Secondary | ICD-10-CM | POA: Diagnosis not present

## 2014-07-27 ENCOUNTER — Telehealth: Payer: Self-pay | Admitting: Family Medicine

## 2014-07-27 DIAGNOSIS — M545 Low back pain: Secondary | ICD-10-CM

## 2014-07-27 DIAGNOSIS — M25562 Pain in left knee: Secondary | ICD-10-CM

## 2014-07-27 DIAGNOSIS — M25561 Pain in right knee: Secondary | ICD-10-CM

## 2014-07-27 NOTE — Telephone Encounter (Signed)
No problem, please ask for what diagnosis or symptom?

## 2014-07-27 NOTE — Telephone Encounter (Signed)
Dr Jefm [place] is a rheumatologist- he treats autoimmune diseases of joints - she would be better off with orthopedics (they treat osteoarthritis and spine dz, etc) is she ok with ortho referral?

## 2014-07-27 NOTE — Telephone Encounter (Signed)
Pt called wanting to get a referral to dr Precious Reel

## 2014-07-27 NOTE — Telephone Encounter (Signed)
Pt said she went to the [facility] on Friday 07/24/14 and was Dx. with Degenerative bone ds. of lower back and arthritis, and Degenerative bone ds. of right knee pt said she is in a lot of pain, she had left over norco from a kidney stone last year and the norco isn't even helping, pt has also tried taking BC's  and nothing is helping

## 2014-07-28 ENCOUNTER — Ambulatory Visit (INDEPENDENT_AMBULATORY_CARE_PROVIDER_SITE_OTHER): Payer: Medicare Other | Admitting: Internal Medicine

## 2014-07-28 ENCOUNTER — Encounter: Payer: Self-pay | Admitting: Internal Medicine

## 2014-07-28 VITALS — BP 132/70 | HR 80 | Ht 61.0 in | Wt 209.2 lb

## 2014-07-28 DIAGNOSIS — M25569 Pain in unspecified knee: Secondary | ICD-10-CM | POA: Insufficient documentation

## 2014-07-28 DIAGNOSIS — Z86010 Personal history of colonic polyps: Secondary | ICD-10-CM | POA: Diagnosis not present

## 2014-07-28 DIAGNOSIS — M549 Dorsalgia, unspecified: Secondary | ICD-10-CM | POA: Insufficient documentation

## 2014-07-28 DIAGNOSIS — Z1211 Encounter for screening for malignant neoplasm of colon: Secondary | ICD-10-CM

## 2014-07-28 MED ORDER — NA SULFATE-K SULFATE-MG SULF 17.5-3.13-1.6 GM/177ML PO SOLN: 1.0000 | Freq: Once | ORAL | Status: DC

## 2014-07-28 NOTE — Patient Instructions (Addendum)
We have sent the following medications to your pharmacy for you to pick up at your convenience: Suprep Dr Glori Bickers

## 2014-07-28 NOTE — Progress Notes (Signed)
Gabriella Howe Aug 09, 1934 [id number]  Note: This dictation was prepared with Dragon digital system. Any transcriptional errors that result from this procedure are unintentional.   History of Present Illness: This is a 80 year old, white female here to discuss recall colonoscopy, she had numerous colonoscopies in the past first one in 1991. Colon polyps were removed  On most of the colonoscopies.. Last 2 colonoscopies were in the 2003 when she had adenoma and in March 2011 but she had tubular adenoma removed. She has loose stools but denies diarrhea or rectal bleeding. There is no abdominal pain. She works as an Medical illustrator. She has been on metformin and also on magnesium supplements which may be causing her stools to be loose. Her brother has ulcerative colitis    Past Medical History  Diagnosis Date  . CHF (congestive heart failure)     diastolic  . Heart disease, unspecified   . Hypertension   . Hyperlipidemia   . Chest pain, unspecified   . Shortness of breath   . Fluid overload   . GERD (gastroesophageal reflux disease)   . Obesity, unspecified   . Diabetes mellitus     type II  . Unspecified asthma(493.90)   . Benign neoplasm of colon   . Unspecified sleep apnea   . Carpal tunnel syndrome   . Rosacea   . Degeneration of intervertebral disc, site unspecified   . Adenomatous colon polyp 2003  . Encounter for long-term (current) use of other medications   . Kidney stone 3/12    hosp/ with stent    Past Surgical History  Procedure Laterality Date  . Abdominal hysterectomy      partial, fibroids  . Esophagogastroduodenoscopy  2000  . Umbilical hernia repair  01/2004  . Ureteral stent placement  2/13    for L sided stone/ ecoli sepsis    Allergies  Allergen Reactions  . Rofecoxib     REACTION: reaction not known  . Tetanus-Diphtheria Toxoids Td     REACTION: sick with fever  . Antihistamines, Chlorpheniramine-Type Palpitations  . Sulfa Antibiotics Rash     Family history and social history have been reviewed.  Review of Systems: Loose stools. Negative for rectal bleeding abdominal pain  The remainder of the 10 point ROS is negative except as outlined in the H&P  Physical Exam: General Appearance Well developed, in no distress, overweight Eyes  Non icteric  HEENT  Non traumatic, normocephalic  Mouth No lesion, tongue papillated, no cheilosis Neck Supple without adenopathy, thyroid not enlarged, no carotid bruits, no JVD Lungs Clear to auscultation bilaterally COR Normal S1, normal S2, regular rhythm, no murmur, quiet precordium Abdomen obese soft, nontender normoactive bowel sounds Rectal small amount of Hemoccult negative stool Extremities  No pedal edema Skin No lesions Neurological Alert and oriented x 3 Psychological Normal mood and affect  Assessment and Plan:    80 year old white female with history of adenomatous polyps of the colon on several colonoscopies and she is due for repeat colonoscopy and would like to schedule. She is a diabetic. We will adjust her medications. We talked about the prep, the procedure and the sedation  Loose stools. Likely related to magnesium supplements as well as to metformin., We will proceed with the random colon biopsies to rule out microscopic colitis   Delfin Edis 07/28/2014    .

## 2014-07-28 NOTE — Telephone Encounter (Signed)
Ref done  

## 2014-07-28 NOTE — Telephone Encounter (Signed)
Pt notified of Dr. Marliss Coots comments/recommendations. Pt agrees to see an ortho doc, pt would like to see someone in [place] if possible. Pt did say she was referred to ortho last yr. in [place] and doesn't want to go back to that office, pt said if possible please put referral in as urgent because pt is in a lot of pain

## 2014-07-29 ENCOUNTER — Encounter: Payer: Self-pay | Admitting: Internal Medicine

## 2014-07-29 ENCOUNTER — Ambulatory Visit (AMBULATORY_SURGERY_CENTER): Payer: Medicare Other | Admitting: Internal Medicine

## 2014-07-29 VITALS — BP 148/87 | HR 76 | Temp 97.9°F | Resp 17 | Ht 61.0 in | Wt 209.0 lb

## 2014-07-29 DIAGNOSIS — Z86010 Personal history of colonic polyps: Secondary | ICD-10-CM

## 2014-07-29 DIAGNOSIS — D123 Benign neoplasm of transverse colon: Secondary | ICD-10-CM

## 2014-07-29 DIAGNOSIS — D122 Benign neoplasm of ascending colon: Secondary | ICD-10-CM | POA: Diagnosis not present

## 2014-07-29 DIAGNOSIS — I509 Heart failure, unspecified: Secondary | ICD-10-CM | POA: Diagnosis not present

## 2014-07-29 MED ORDER — SODIUM CHLORIDE 0.9 % IV SOLN: 500.0000 mL | INTRAVENOUS | Status: DC

## 2014-07-29 NOTE — Progress Notes (Signed)
Called to room to assist during endoscopic procedure.  Patient ID and intended procedure confirmed with present staff. Received instructions for my participation in the procedure from the performing physician.  

## 2014-07-29 NOTE — Patient Instructions (Signed)
YOU HAD AN ENDOSCOPIC PROCEDURE TODAY AT [facility]:   Refer to the procedure report that was given to you for any specific questions about what was found during the examination.  If the procedure report does not answer your questions, please call your gastroenterologist to clarify.  If you requested that your care partner not be given the details of your procedure findings, then the procedure report has been included in a sealed envelope for you to review at your convenience later.  YOU SHOULD EXPECT: Some feelings of bloating in the abdomen. Passage of more gas than usual.  Walking can help get rid of the air that was put into your GI tract during the procedure and reduce the bloating. If you had a lower endoscopy (such as a colonoscopy or flexible sigmoidoscopy) you may notice spotting of blood in your stool or on the toilet paper. If you underwent a bowel prep for your procedure, you may not have a normal bowel movement for a few days.  Please Note:  You might notice some irritation and congestion in your nose or some drainage.  This is from the oxygen used during your procedure.  There is no need for concern and it should clear up in a day or so.  SYMPTOMS TO REPORT IMMEDIATELY:   Following lower endoscopy (colonoscopy or flexible sigmoidoscopy):  Excessive amounts of blood in the stool  Significant tenderness or worsening of abdominal pains  Swelling of the abdomen that is new, acute  Fever of 100F or higher   For urgent or emergent issues, a gastroenterologist can be reached at any hour by calling [phone].   DIET: Your first meal following the procedure should be a small meal and then it is ok to progress to your normal diet. Heavy or fried foods are harder to digest and may make you feel nauseous or bloated.  Likewise, meals heavy in dairy and vegetables can increase bloating.  Drink plenty of fluids but you should avoid alcoholic beverages for 24  hours.  ACTIVITY:  You should plan to take it easy for the rest of today and you should NOT DRIVE or use heavy machinery until tomorrow (because of the sedation medicines used during the test).    FOLLOW UP: Our staff will call the number listed on your records the next business day following your procedure to check on you and address any questions or concerns that you may have regarding the information given to you following your procedure. If we do not reach you, we will leave a message.  However, if you are feeling well and you are not experiencing any problems, there is no need to return our call.  We will assume that you have returned to your regular daily activities without incident.  If any biopsies were taken you will be contacted by phone or by letter within the next 1-3 weeks.  Please call us at [phone] if you have not heard about the biopsies in 3 weeks.    SIGNATURES/CONFIDENTIALITY: You and/or your care partner have signed paperwork which will be entered into your electronic medical record.  These signatures attest to the fact that that the information above on your After Visit Summary has been reviewed and is understood.  Full responsibility of the confidentiality of this discharge information lies with you and/or your care-partner.  Polyps, diverticulosis, and high fiber diet information given.

## 2014-07-29 NOTE — Op Note (Signed)
[place]  [address] [place], [id number]   COLONOSCOPY PROCEDURE REPORT  PATIENT: [name], [name]  MR#: [id number] BIRTHDATE: [birth year] , 79  yrs. old GENDER: female ENDOSCOPIST: Lafayette Dragon, MD REFERRED FI:EPPIR Vernell Morgans, M.D. PROCEDURE DATE:  07/29/2014 PROCEDURE:   Colonoscopy, surveillance and Colonoscopy with snare polypectomy First Screening Colonoscopy - Avg.  risk and is 50 yrs.  old or older - No.  Prior Negative Screening - Now for repeat screening. N/A  History of Adenoma - Now for follow-up colonoscopy & has been > or = to 3 yrs.  Yes hx of adenoma.  Has been 3 or more years since last colonoscopy.  Polyps Removed Today ASA CLASS:   Class II INDICATIONS:FH Colon or Rectal Adenocarcinoma, PH Colon Adenoma, and positive family history of colon cancer in direct relatives. Personal history of adenomatous polyps on the last several colonoscopies starting in 1991.  Last one in 2011. MEDICATIONS: Monitored anesthesia care and Propofol 200 mg IV  DESCRIPTION OF PROCEDURE:   After the risks benefits and alternatives of the procedure were thoroughly explained, informed consent was obtained.  The digital rectal exam revealed no abnormalities of the rectum.   The LB [id number]  endoscope was introduced through the anus and advanced to the cecum, which was identified by both the appendix and ileocecal valve. No adverse events experienced.   The quality of the prep was good.  (MoviPrep was used)  The instrument was then slowly withdrawn as the colon was fully examined.      COLON FINDINGS: Two firm sessile polyps measuring 15 mm in size were found in the ascending colon and transverse colon.  A polypectomy was performed using snare cautery.  The resection was complete, the polyp tissue was completely retrieved and sent to histology.  A polypectomy was performed with a cold snare.  The resection was complete, the polyp tissue was completely  retrieved and sent to histology.   There was mild diverticulosis noted throughout the entire examined colon.  Retroflexed views revealed no abnormalities. The time to cecum = 9.16 Withdrawal time = 11.43 The scope was withdrawn and the procedure completed. COMPLICATIONS: There were no immediate complications.  ENDOSCOPIC IMPRESSION: 1.   Two sessile polyps were found in the ascending colon x 15 mm and transverse colon x 9 mm, polypectomy was performed using snare cautery in the ascending colon  polypectomy was performed with a cold snare in transverse colon 2.   Mild diverticulosis was noted throughout the entire examined colon  RECOMMENDATIONS: 1.  Await pathology results 2.  High fiber diet Recall colonoscopy pending path report  eSigned:  Lafayette Dragon, MD 07/29/2014 12:08 PM   cc:   PATIENT NAME:  Gabriella Howe, Gabriella Howe MR#: [id number]

## 2014-07-29 NOTE — Progress Notes (Signed)
Report to PACU, RN, vss, BBS= Clear.  

## 2014-07-30 ENCOUNTER — Telehealth: Payer: Self-pay | Admitting: *Deleted

## 2014-07-30 NOTE — Telephone Encounter (Signed)
  Follow up Call-  Call back number 07/29/2014  Post procedure Call Back phone  # [phone]  Permission to leave phone message Yes   [facility]

## 2014-08-03 ENCOUNTER — Encounter: Payer: Self-pay | Admitting: Internal Medicine

## 2014-08-13 DIAGNOSIS — M5136 Other intervertebral disc degeneration, lumbar region: Secondary | ICD-10-CM | POA: Diagnosis not present

## 2014-08-13 DIAGNOSIS — M1711 Unilateral primary osteoarthritis, right knee: Secondary | ICD-10-CM | POA: Diagnosis not present

## 2014-08-13 DIAGNOSIS — M41126 Adolescent idiopathic scoliosis, lumbar region: Secondary | ICD-10-CM | POA: Diagnosis not present

## 2014-09-02 ENCOUNTER — Other Ambulatory Visit (INDEPENDENT_AMBULATORY_CARE_PROVIDER_SITE_OTHER): Payer: Medicare Other

## 2014-09-02 DIAGNOSIS — E119 Type 2 diabetes mellitus without complications: Secondary | ICD-10-CM

## 2014-09-02 LAB — HEMOGLOBIN A1C: HEMOGLOBIN A1C: 7.2 % — AB (ref 4.6–6.5)

## 2014-09-03 ENCOUNTER — Encounter: Payer: Self-pay | Admitting: Internal Medicine

## 2014-09-11 ENCOUNTER — Ambulatory Visit (INDEPENDENT_AMBULATORY_CARE_PROVIDER_SITE_OTHER): Payer: Medicare Other | Admitting: Family Medicine

## 2014-09-11 ENCOUNTER — Encounter: Payer: Self-pay | Admitting: Family Medicine

## 2014-09-11 VITALS — BP 128/70 | HR 80 | Temp 98.0°F | Ht 61.0 in | Wt 213.0 lb

## 2014-09-11 DIAGNOSIS — I10 Essential (primary) hypertension: Secondary | ICD-10-CM

## 2014-09-11 DIAGNOSIS — E669 Obesity, unspecified: Secondary | ICD-10-CM | POA: Diagnosis not present

## 2014-09-11 DIAGNOSIS — E119 Type 2 diabetes mellitus without complications: Secondary | ICD-10-CM | POA: Diagnosis not present

## 2014-09-11 NOTE — Progress Notes (Signed)
Pre visit review using our clinic review tool, if applicable. No additional management support is needed unless otherwise documented below in the visit note. 

## 2014-09-11 NOTE — Progress Notes (Signed)
Subjective:    Patient ID: Gabriella Howe, female    DOB: [birth year], 80 y.o.   MRN: [id number]  HPI Here for f/u of chronic medical problems   Feeling pretty good overall  Had a bout with her arthritis in leg/knee and went to ortho - given meloxicam It really help   Wt is up 4 lb with bmi of 40 Unsure if she is ready to commit to weight loss (will not join weight watchers) She thinks it is fluid retention-from heat and mobic  Does not drink enough water    bp is stable today  No cp or palpitations or headaches or edema  No side effects to medicines  BP Readings from Last 3 Encounters:  09/11/14 128/70  07/29/14 148/87  07/28/14 132/70      Diabetes Home sugar results -does not check often , usually  Low 100s in am               Highest 180s after eating  DM diet - eating less in general/less appetite, does not totally stick to a diabetic diet -occ sweets , too many sandwiches  Eats a lot frozen dinners  Exercise -has not been doing much until her leg got better, now goes up and down steps and walks on treadmill 15 min at at time  Symptoms-none  A1C last  Lab Results  Component Value Date   HGBA1C 7.2* 09/02/2014   This is down from 7.4  No problems with medications  Renal protection ace  Last eye exam 12/15   Patient Active Problem List   Diagnosis Date Noted  . Back pain 07/28/2014  . Knee pain 07/28/2014  . Encounter for Medicare annual wellness exam 11/19/2012  . History of kidney stones 11/19/2012  . Abnormal mammogram 12/21/2011  . Kidney stone 06/07/2011  . Other screening mammogram 08/10/2010  . DIASTOLIC HEART FAILURE, CHRONIC 07/29/2009  . DYSPNEA 07/23/2009  . HYPERCHOLESTEROLEMIA, PURE 09/13/2006  . FLUID RETENTION 09/10/2006  . Obesity 09/10/2006  . CARPAL TUNNEL SYNDROME 09/10/2006  . Essential hypertension 09/10/2006  . DIASTOLIC DYSFUNCTION 64/40/3474  . REACTIVE AIRWAY DISEASE 09/10/2006  . GERD 09/10/2006  . ROSACEA 09/10/2006  .  Degeneration of lumbar or lumbosacral intervertebral disc 09/10/2006  . SLEEP APNEA 09/10/2006  . COLONIC POLYPS, HX OF 09/10/2006  . Diabetes type 2, controlled 09/06/2006   Past Medical History  Diagnosis Date  . CHF (congestive heart failure)     diastolic  . Heart disease, unspecified   . Hypertension   . Hyperlipidemia   . Chest pain, unspecified   . Shortness of breath   . Fluid overload   . GERD (gastroesophageal reflux disease)   . Obesity, unspecified   . Diabetes mellitus     type II  . Unspecified asthma(493.90)   . Benign neoplasm of colon   . Unspecified sleep apnea   . Carpal tunnel syndrome   . Rosacea   . Degeneration of intervertebral disc, site unspecified   . Adenomatous colon polyp 2003  . Encounter for long-term (current) use of other medications   . Kidney stone 3/12    hosp/ with stent   Past Surgical History  Procedure Laterality Date  . Abdominal hysterectomy      partial, fibroids  . Esophagogastroduodenoscopy  2000  . Umbilical hernia repair  01/2004  . Ureteral stent placement  2/13    for L sided stone/ ecoli sepsis   History  Substance Use Topics  . Smoking  status: Never Smoker   . Smokeless tobacco: Not on file  . Alcohol Use: No   No family history on file. Allergies  Allergen Reactions  . Rofecoxib     REACTION: reaction not known  . Tetanus-Diphtheria Toxoids Td     REACTION: sick with fever  . Antihistamines, Chlorpheniramine-Type Palpitations  . Sulfa Antibiotics Rash   Current Outpatient Prescriptions on File Prior to Visit  Medication Sig Dispense Refill  . Ascorbic Acid (VITAMIN C) 500 MG tablet Take 500 mg by mouth daily.      . Aspirin-Caffeine (BC FAST PAIN RELIEF PO) Take by mouth as directed.      . benazepril (LOTENSIN) 20 MG tablet Take 1 tablet by mouth  daily 90 tablet 3  . CALCIUM-VITAMIN D PO Take 1 tablet by mouth 2 (two) times daily.     . furosemide (LASIX) 40 MG tablet Take one-half tablet by  mouth  daily 45 tablet 3  . glucose blood (BAYER CONTOUR TEST) test strip Use to check blood sugar once daily as directed for DM 250.0 100 each 1  . Magnesium 250 MG TABS Take by mouth daily.    . metFORMIN (GLUCOPHAGE) 1000 MG tablet Take 1 tablet (1,000 mg total) by mouth 2 (two) times daily with a meal. 180 tablet 3  . metroNIDAZOLE (METROGEL) 0.75 % gel Apply topically daily. To affected area on face.Rosacea. 45 g 3  . MICROLET LANCETS MISC Check blood sugar once daily as directed for DM 250.0 100 each 1  . Multiple Vitamin (MULTIVITAMIN) capsule Take 1 capsule by mouth daily.      Marland Kitchen olmesartan (BENICAR) 20 MG tablet Take 20 mg by mouth daily.     . Omega-3 Fatty Acids (FISH OIL PO) Take 1 capsule by mouth daily.     Marland Kitchen omeprazole (PRILOSEC) 20 MG capsule Take 1 capsule by mouth  daily 90 capsule 3  . simvastatin (ZOCOR) 40 MG tablet Take 1 tablet by mouth at  bedtime 90 tablet 3  . vitamin E 400 UNIT capsule Take 400 Units by mouth daily.      . Pseudoephedrine HCl (SINUS & ALLERGY 12 HOUR PO) Take by mouth as directed.       No current facility-administered medications on file prior to visit.     Review of Systems Review of Systems  Constitutional: Negative for fever, appetite change, fatigue and unexpected weight change.  Eyes: Negative for pain and visual disturbance.  Respiratory: Negative for cough and shortness of breath.   Cardiovascular: Negative for cp or palpitations    Gastrointestinal: Negative for nausea, diarrhea and constipation.  Genitourinary: Negative for urgency and frequency.  Skin: Negative for pallor or rash   Neurological: Negative for weakness, light-headedness, numbness and headaches.  Hematological: Negative for adenopathy. Does not bruise/bleed easily.  Psychiatric/Behavioral: Negative for dysphoric mood. The patient is not nervous/anxious.         Objective:   Physical Exam  Constitutional: She appears well-developed and well-nourished. No distress.  obese  and well appearing   HENT:  Head: Normocephalic and atraumatic.  Mouth/Throat: Oropharynx is clear and moist.  Eyes: Conjunctivae and EOM are normal. Pupils are equal, round, and reactive to light.  Neck: Normal range of motion. Neck supple. No JVD present. Carotid bruit is not present. No thyromegaly present.  Cardiovascular: Normal rate, regular rhythm, normal heart sounds and intact distal pulses.  Exam reveals no gallop.   Pulmonary/Chest: Effort normal and breath sounds normal. No respiratory distress.  She has no wheezes. She has no rales.  No crackles  Abdominal: Soft. Bowel sounds are normal. She exhibits no distension, no abdominal bruit and no mass. There is no tenderness.  Musculoskeletal: She exhibits no edema.  Lymphadenopathy:    She has no cervical adenopathy.  Neurological: She is alert. She has normal reflexes.  Skin: Skin is warm and dry. No rash noted.  Psychiatric: She has a normal mood and affect.          Assessment & Plan:   Problem List Items Addressed This Visit    Diabetes type 2, controlled    Lab Results  Component Value Date   HGBA1C 7.2* 09/02/2014   Goal is under 7  Improved but not at goal  Rev low glycemic diet and exercise to get to goal Pt declines further med Will work on wt loss F/u 3-6 mo after labs       Essential hypertension - Primary    bp in fair control at this time  BP Readings from Last 1 Encounters:  09/11/14 128/70   No changes needed Disc lifstyle change with low sodium diet and exercise  Labs reviewed       Obesity    Discussed how this problem influences overall health and the risks it imposes  Reviewed plan for weight loss with lower calorie diet (via better food choices and also portion control or program like weight watchers) and exercise building up to or more than 30 minutes 5 days per week including some aerobic activity   Made plan for DM diet/ portion cutting and exercise

## 2014-09-11 NOTE — Patient Instructions (Signed)
Please commit to loosing weight  Buy a calorie counting book and aim for 1500 calories a day  Aim for 30 minutes of exercise per day  Goal is A1C below 7  Weight loss will help all your pains , mobility and other health problems  Follow up for annual exam with labs prior in 3-6 months with labs prior

## 2014-09-12 NOTE — Assessment & Plan Note (Signed)
Lab Results  Component Value Date   HGBA1C 7.2* 09/02/2014   Goal is under 7  Improved but not at goal  Rev low glycemic diet and exercise to get to goal Pt declines further med Will work on wt loss F/u 3-6 mo after labs

## 2014-09-12 NOTE — Assessment & Plan Note (Signed)
Discussed how this problem influences overall health and the risks it imposes  Reviewed plan for weight loss with lower calorie diet (via better food choices and also portion control or program like weight watchers) and exercise building up to or more than 30 minutes 5 days per week including some aerobic activity   Made plan for DM diet/ portion cutting and exercise

## 2014-09-12 NOTE — Assessment & Plan Note (Signed)
bp in fair control at this time  BP Readings from Last 1 Encounters:  09/11/14 128/70   No changes needed Disc lifstyle change with low sodium diet and exercise  Labs reviewed

## 2014-12-14 ENCOUNTER — Ambulatory Visit (INDEPENDENT_AMBULATORY_CARE_PROVIDER_SITE_OTHER): Payer: Medicare Other | Admitting: Primary Care

## 2014-12-14 VITALS — BP 128/70 | HR 82 | Temp 97.7°F | Ht 61.0 in | Wt 208.8 lb

## 2014-12-14 DIAGNOSIS — R21 Rash and other nonspecific skin eruption: Secondary | ICD-10-CM | POA: Diagnosis not present

## 2014-12-14 MED ORDER — TRIAMCINOLONE ACETONIDE 0.1 % EX CREA: 1.0000 "application " | TOPICAL_CREAM | Freq: Two times a day (BID) | CUTANEOUS | Status: DC

## 2014-12-14 NOTE — Progress Notes (Signed)
Pre visit review using our clinic review tool, if applicable. No additional management support is needed unless otherwise documented below in the visit note. 

## 2014-12-14 NOTE — Progress Notes (Signed)
Subjective:    Patient ID: Gabriella Howe, female    DOB: March 13, 1935, 80 y.o.   MRN: [id number]  HPI  Gabriella Howe is a 80 year old female who presents today with a chief complaint of blisters. She first noticed these blisters on Saturday morning. There are numerous blisters, some open and some closed. Her blisters are located to her bilateral lower and upper extremities. She describes her blisters as burning and itchy in nature. She applied metronidazole gel yesterday afternoon, that she uses for rosesa, with some relief. Denies fevers. She's not been outside working in the yard, has not noticed any ticks on her body, and has two indoor cats.   Review of Systems  Constitutional: Negative for fever and chills.  HENT: Negative for sore throat.   Respiratory: Negative for cough.   Musculoskeletal: Negative for myalgias and arthralgias.  Skin: Positive for wound.       Past Medical History  Diagnosis Date  . CHF (congestive heart failure)     diastolic  . Heart disease, unspecified   . Hypertension   . Hyperlipidemia   . Chest pain, unspecified   . Shortness of breath   . Fluid overload   . GERD (gastroesophageal reflux disease)   . Obesity, unspecified   . Diabetes mellitus     type II  . Unspecified asthma(493.90)   . Benign neoplasm of colon   . Unspecified sleep apnea   . Carpal tunnel syndrome   . Rosacea   . Degeneration of intervertebral disc, site unspecified   . Adenomatous colon polyp 2003  . Encounter for long-term (current) use of other medications   . Kidney stone 3/12    hosp/ with stent    Social History   Social History  . Marital Status: Widowed    Spouse Name: N/A  . Number of Children: N/A  . Years of Education: N/A   Occupational History  . Not on file.   Social History Main Topics  . Smoking status: Never Smoker   . Smokeless tobacco: Not on file  . Alcohol Use: No  . Drug Use: No  . Sexual Activity: Not on file   Other Topics Concern    . Not on file   Social History Narrative    Past Surgical History  Procedure Laterality Date  . Abdominal hysterectomy      partial, fibroids  . Esophagogastroduodenoscopy  2000  . Umbilical hernia repair  01/2004  . Ureteral stent placement  2/13    for L sided stone/ ecoli sepsis    No family history on file.  Allergies  Allergen Reactions  . Rofecoxib     REACTION: reaction not known  . Tetanus-Diphtheria Toxoids Td     REACTION: sick with fever  . Antihistamines, Chlorpheniramine-Type Palpitations  . Sulfa Antibiotics Rash    Current Outpatient Prescriptions on File Prior to Visit  Medication Sig Dispense Refill  . Ascorbic Acid (VITAMIN C) 500 MG tablet Take 500 mg by mouth daily.      . Aspirin-Caffeine (BC FAST PAIN RELIEF PO) Take by mouth as directed.      . benazepril (LOTENSIN) 20 MG tablet Take 1 tablet by mouth  daily 90 tablet 3  . CALCIUM-VITAMIN D PO Take 1 tablet by mouth 2 (two) times daily.     . furosemide (LASIX) 40 MG tablet Take one-half tablet by  mouth daily 45 tablet 3  . glucose blood (BAYER CONTOUR TEST) test strip Use to  check blood sugar once daily as directed for DM 250.0 100 each 1  . Magnesium 250 MG TABS Take by mouth daily.    . metFORMIN (GLUCOPHAGE) 1000 MG tablet Take 1 tablet (1,000 mg total) by mouth 2 (two) times daily with a meal. 180 tablet 3  . metroNIDAZOLE (METROGEL) 0.75 % gel Apply topically daily. To affected area on face.Rosacea. 45 g 3  . MICROLET LANCETS MISC Check blood sugar once daily as directed for DM 250.0 100 each 1  . Multiple Vitamin (MULTIVITAMIN) capsule Take 1 capsule by mouth daily.      Marland Kitchen olmesartan (BENICAR) 20 MG tablet Take 20 mg by mouth daily.     . Omega-3 Fatty Acids (FISH OIL PO) Take 1 capsule by mouth daily.     Marland Kitchen omeprazole (PRILOSEC) 20 MG capsule Take 1 capsule by mouth  daily 90 capsule 3  . Pseudoephedrine HCl (SINUS & ALLERGY 12 HOUR PO) Take by mouth as directed.      . simvastatin (ZOCOR)  40 MG tablet Take 1 tablet by mouth at  bedtime 90 tablet 3  . vitamin E 400 UNIT capsule Take 400 Units by mouth daily.       No current facility-administered medications on file prior to visit.    BP 128/70 mmHg  Pulse 82  Temp(Src) 97.7 F (36.5 C) (Oral)  Ht 5\' 1"  (1.549 m)  Wt 208 lb 12.8 oz (94.711 kg)  BMI 39.47 kg/m2  SpO2 98%    Objective:   Physical Exam  Constitutional: She appears well-nourished.  Cardiovascular: Normal rate and regular rhythm.   Pulmonary/Chest: Effort normal and breath sounds normal.  Skin:  Multiple open and closed blisters representing bullous pemphigoid. Numerous stages of healing. Present to bilateral lower and upper extremities.          Assessment & Plan:  Blisters:  Multiple. Present to upper and lower extremities representing bullous pemphigoid. Itchy and burning in nature. RX for triamcinolone 0.1% cream to apply BID. Discussed care including keeping blisters intact and exposed ones clean. Follow up PRN if no improvement.

## 2014-12-14 NOTE — Patient Instructions (Signed)
Apply triamcinolone cream twice daily to affected areas.   Your blisters look like bullous pemphigoid.  These should clear up with the cream provided.  It's important not to open your blisters. Allow them to heal without popping them.  Please notify me if you develop fevers, extreme itching or pain.  It was a pleasure meeting you!

## 2015-01-27 LAB — HM DIABETES EYE EXAM

## 2015-03-29 ENCOUNTER — Telehealth: Payer: Self-pay | Admitting: Family Medicine

## 2015-03-29 DIAGNOSIS — I10 Essential (primary) hypertension: Secondary | ICD-10-CM

## 2015-03-29 DIAGNOSIS — E119 Type 2 diabetes mellitus without complications: Secondary | ICD-10-CM

## 2015-03-29 DIAGNOSIS — E7800 Pure hypercholesterolemia, unspecified: Secondary | ICD-10-CM

## 2015-03-29 NOTE — Telephone Encounter (Signed)
-----   Message from Ellamae Sia sent at 03/24/2015  3:41 PM EST ----- Regarding: Lab orders for Wednesday, 1.4.17 Patient is scheduled for CPX labs, please order future labs, Thanks , Karna Christmas

## 2015-03-31 ENCOUNTER — Other Ambulatory Visit (INDEPENDENT_AMBULATORY_CARE_PROVIDER_SITE_OTHER): Payer: Medicare Other

## 2015-03-31 DIAGNOSIS — E119 Type 2 diabetes mellitus without complications: Secondary | ICD-10-CM

## 2015-03-31 DIAGNOSIS — I10 Essential (primary) hypertension: Secondary | ICD-10-CM | POA: Diagnosis not present

## 2015-03-31 DIAGNOSIS — E7800 Pure hypercholesterolemia, unspecified: Secondary | ICD-10-CM

## 2015-03-31 LAB — COMPREHENSIVE METABOLIC PANEL
ALBUMIN: 4.1 g/dL (ref 3.5–5.2)
ALK PHOS: 64 U/L (ref 39–117)
ALT: 16 U/L (ref 0–35)
AST: 16 U/L (ref 0–37)
BILIRUBIN TOTAL: 0.7 mg/dL (ref 0.2–1.2)
BUN: 16 mg/dL (ref 6–23)
CALCIUM: 10 mg/dL (ref 8.4–10.5)
CO2: 24 meq/L (ref 19–32)
CREATININE: 0.75 mg/dL (ref 0.40–1.20)
Chloride: 107 mEq/L (ref 96–112)
GFR: 79.35 mL/min (ref >60.00)
Glucose, Bld: 144 mg/dL — ABNORMAL HIGH (ref 70–99)
Potassium: 4.1 mEq/L (ref 3.5–5.1)
Sodium: 142 mEq/L (ref 135–145)
TOTAL PROTEIN: 6.7 g/dL (ref 6.0–8.3)

## 2015-03-31 LAB — CBC WITH DIFFERENTIAL/PLATELET
BASOS ABS: 0 10*3/uL (ref 0.0–0.1)
Basophils Relative: 0.4 % (ref 0.0–3.0)
EOS ABS: 0.2 10*3/uL (ref 0.0–0.7)
Eosinophils Relative: 2.7 % (ref 0.0–5.0)
HEMATOCRIT: 38.4 % (ref 36.0–46.0)
HEMOGLOBIN: 12.5 g/dL (ref 12.0–15.0)
LYMPHS PCT: 34.8 % (ref 12.0–46.0)
Lymphs Abs: 2.4 10*3/uL (ref 0.7–4.0)
MCHC: 32.6 g/dL (ref 30.0–36.0)
MCV: 85 fl (ref 78.0–100.0)
MONOS PCT: 6.4 % (ref 3.0–12.0)
Monocytes Absolute: 0.5 10*3/uL (ref 0.1–1.0)
Neutro Abs: 3.9 10*3/uL (ref 1.4–7.7)
Neutrophils Relative %: 55.7 % (ref 43.0–77.0)
PLATELETS: 245 10*3/uL (ref 150.0–400.0)
RBC: 4.51 Mil/uL (ref 3.87–5.11)
RDW: 15.2 % (ref 11.5–15.5)
WBC: 7 10*3/uL (ref 4.0–10.5)

## 2015-03-31 LAB — LIPID PANEL
CHOL/HDL RATIO: 2
CHOLESTEROL: 129 mg/dL (ref 0–200)
HDL: 52.9 mg/dL (ref >39.00)
LDL CALC: 47 mg/dL (ref 0–99)
NonHDL: 76.41
Triglycerides: 148 mg/dL (ref 0.0–149.0)
VLDL: 29.6 mg/dL (ref 0.0–40.0)

## 2015-03-31 LAB — TSH: TSH: 2.21 u[IU]/mL (ref 0.35–4.50)

## 2015-03-31 LAB — HEMOGLOBIN A1C: Hgb A1c MFr Bld: 7.6 % — ABNORMAL HIGH (ref 4.6–6.5)

## 2015-04-05 ENCOUNTER — Encounter: Payer: Medicare Other | Admitting: Family Medicine

## 2015-04-13 IMAGING — CT CT ANGIO CHEST
1 series · 19 of 32 positions shown · IV contrast (APPLIED)
Comparison: none

REASON FOR EXAM: Recent ICU admission folr sepsis, Now SOB, weakness
COMMENTS:

[Series 4: soft tissue · axial · 0.62mm/px · z∈[-414,-177]mm · 19 of 85 slices shown]
[im 3/85  lung]
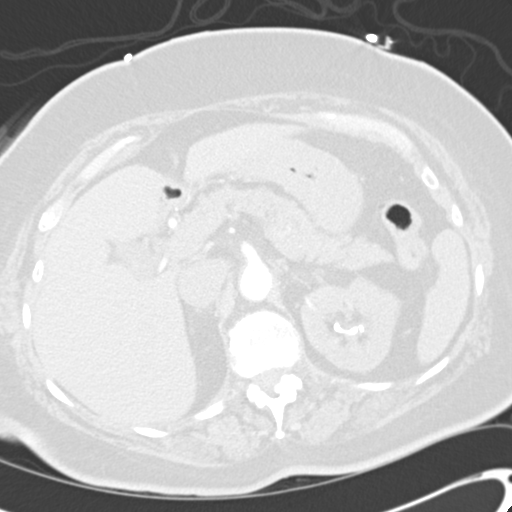
[im 9/85  soft-tissue]
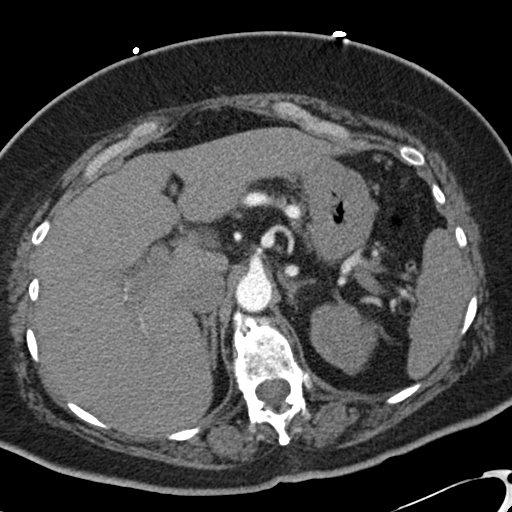
[im 11/85  lung]
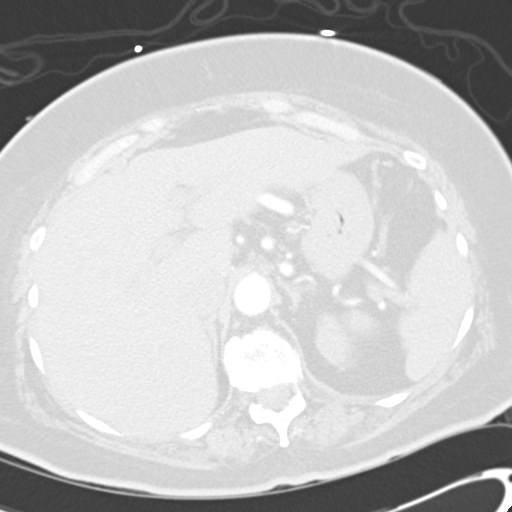
[im 17/85  soft-tissue]
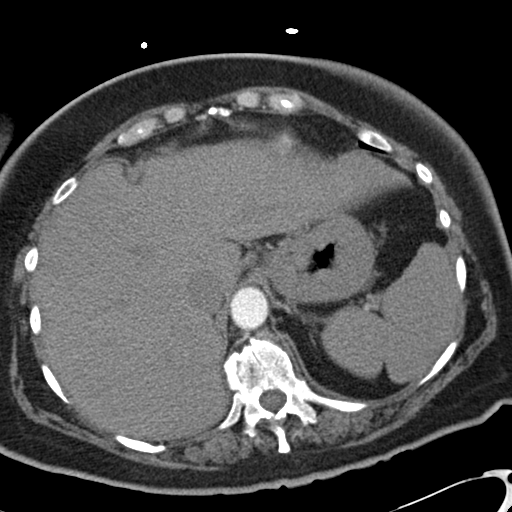
[im 22/85  lung]
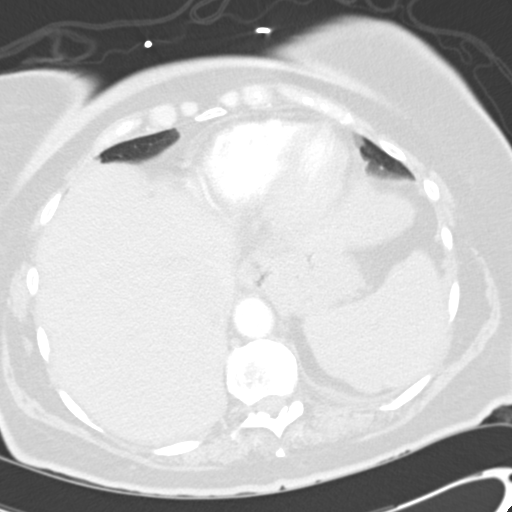
[im 25/85  soft-tissue]
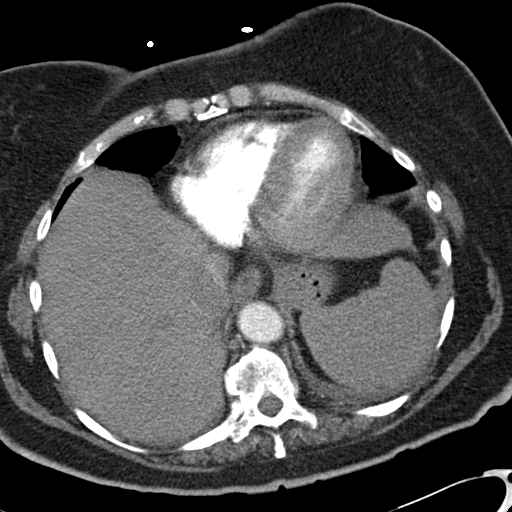
[im 30/85  lung]
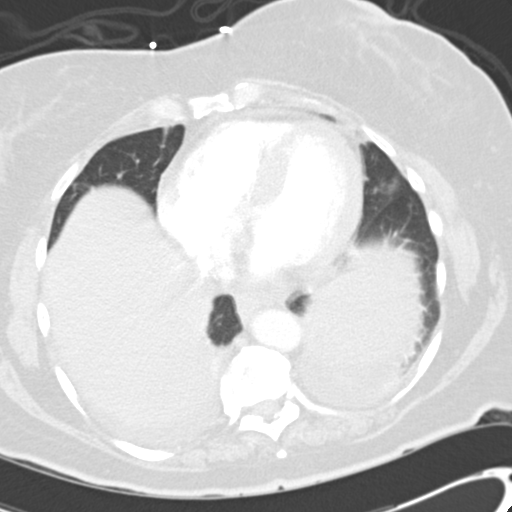
[im 33/85  soft-tissue]
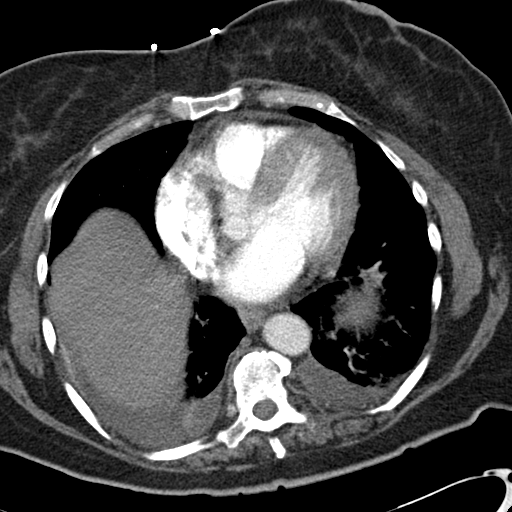
[im 38/85  lung]
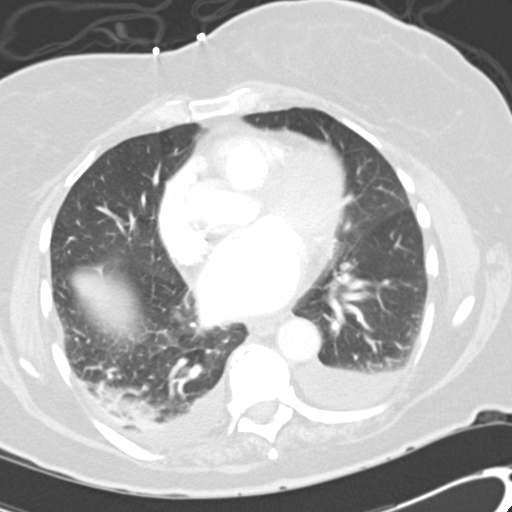
[im 44/85  soft-tissue]
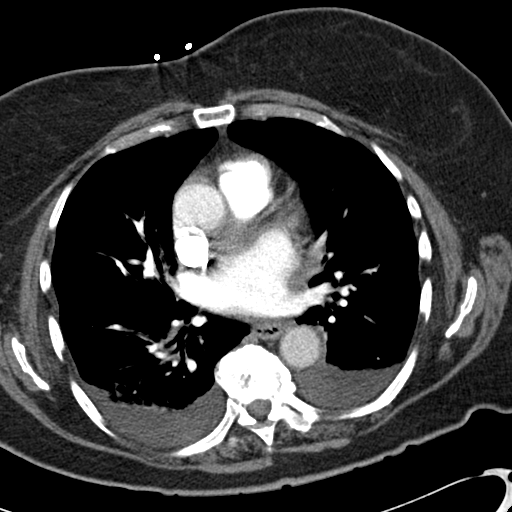
[im 47/85  lung]
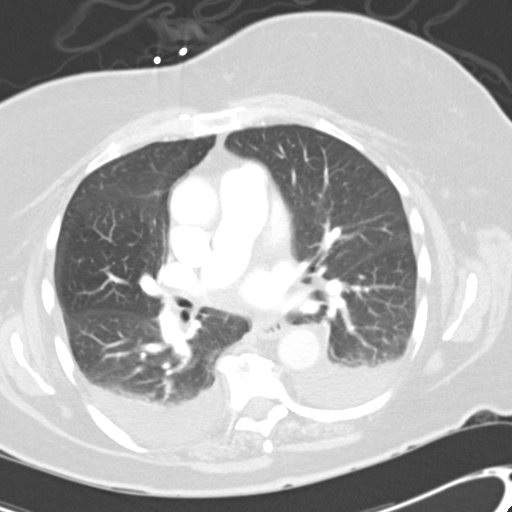
[im 52/85  soft-tissue]
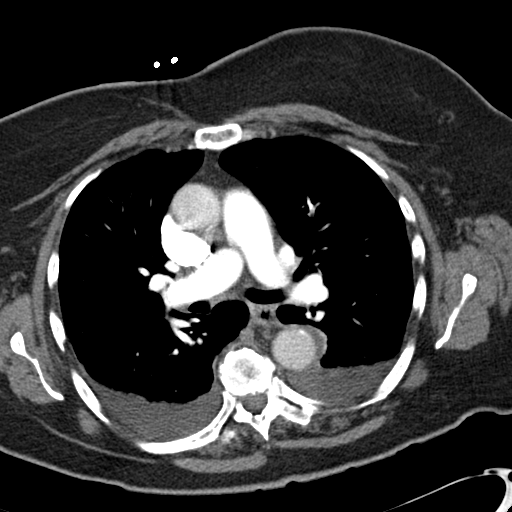
[im 55/85  lung]
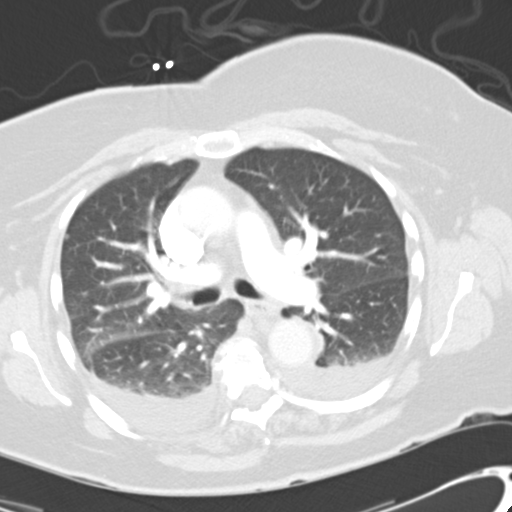
[im 60/85  soft-tissue]
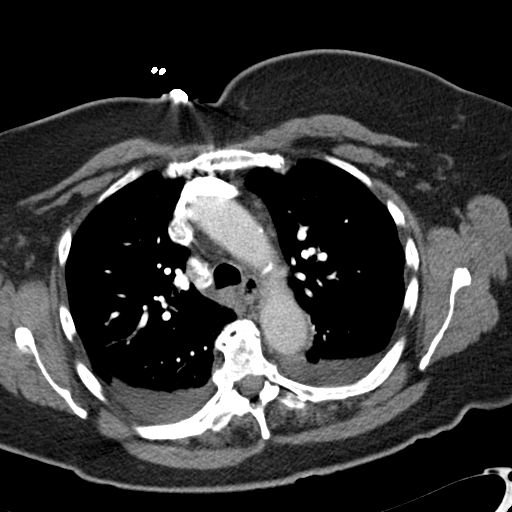
[im 63/85  lung]
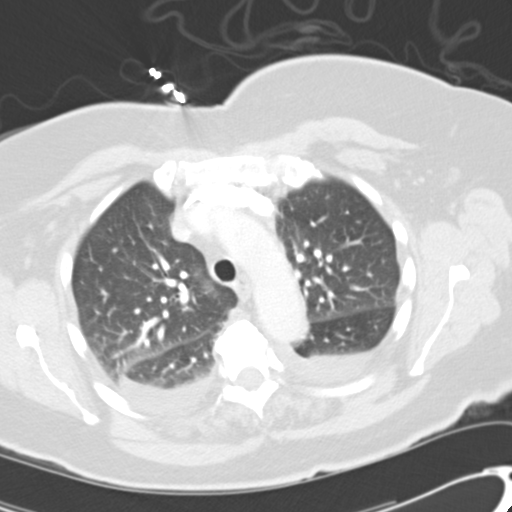
[im 68/85  soft-tissue]
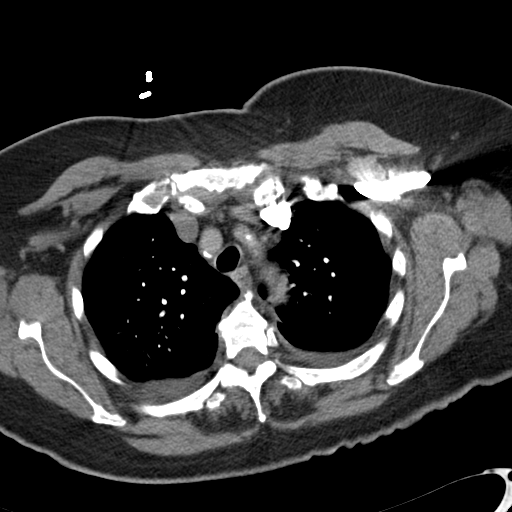
[im 74/85  lung]
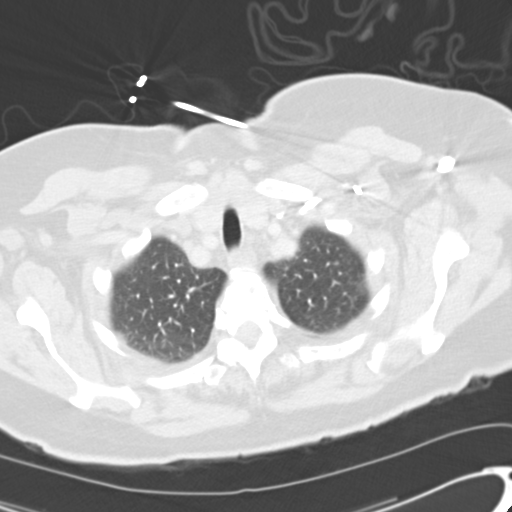
[im 76/85  soft-tissue]
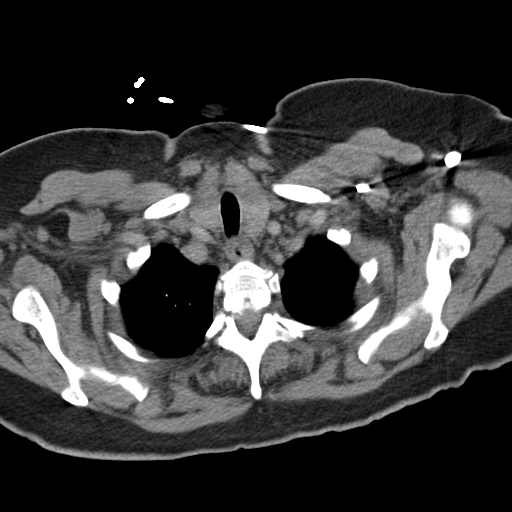
[im 82/85  lung]
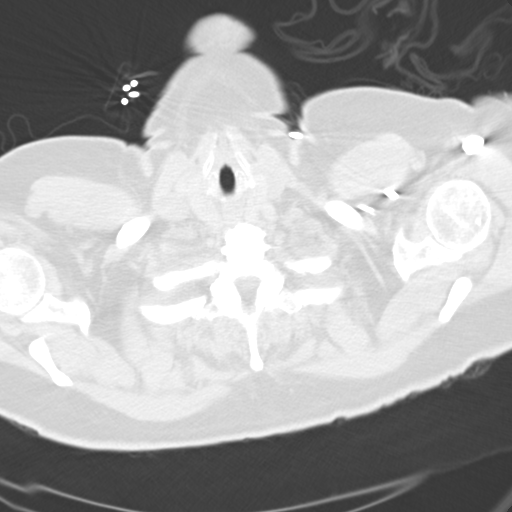

[19 of 32 positions shown; findings below may reference images not displayed]

PROCEDURE:     CT  - CT ANGIOGRAPHY CHEST W FOR PE  - November 15, 2012 [DATE]

RESULT:     Chest CT utilizing CT angiography technique is performed with
100 mL of [id number] iodinated intravenous contrast with images
reconstructed in the axial plane at 3 mm slice thickness and in the
multiplanar format with Syngo Via. There is no previous similar study for
comparison.

There small bilateral pleural effusions with some compressive atelectasis.
The thoracic aorta is normal in caliber relatively unenhanced. The stomach
is nondistended. The wall appears thickened. Correlate for gastritis. The
upper portion of the double-J left ureteral stent is seen in the left kidney
collecting system. No pulmonary arterial filling defect is evident to
suggest pulmonary embolism. No pericardial effusion is evident. There is
hazy increased density posteriorly in the right upper lobe along the major
fissure with less prominent similar change on the left and also in the
dependent portions of the lower lobes. Minimal atelectasis is likely. No
endobronchial lesion is seen. There is no pneumothorax evident. There is no
mediastinal or hilar mass or adenopathy evident. Bony structures show
degenerative change. Multilevel degenerative disc narrowing with prominent
hypertrophic spurring is present. No compression deformity or subluxation is
seen. There is thoracic kyphosis present.
IMPRESSION: 1. No evidence of pulmonary embolic filling defect.
2. Bilateral small pleural effusions with some compressive atelectasis.
3. Minimal ill-defined hazy density suggestive of atelectasis in both upper
lobes greater on the right than the left. Early infiltrate is felt to be
less likely. There is no discrete mass or evidence of adenopathy.
4. Double-J left ureteral stent partially visualized in the upper portion of
the abdomen.

[REDACTED]

## 2015-04-14 ENCOUNTER — Ambulatory Visit (INDEPENDENT_AMBULATORY_CARE_PROVIDER_SITE_OTHER): Payer: Medicare Other | Admitting: Family Medicine

## 2015-04-14 ENCOUNTER — Encounter: Payer: Self-pay | Admitting: Family Medicine

## 2015-04-14 VITALS — BP 126/72 | HR 81 | Temp 98.5°F | Ht 61.0 in | Wt 209.2 lb

## 2015-04-14 DIAGNOSIS — Z0000 Encounter for general adult medical examination without abnormal findings: Secondary | ICD-10-CM | POA: Diagnosis not present

## 2015-04-14 DIAGNOSIS — I10 Essential (primary) hypertension: Secondary | ICD-10-CM

## 2015-04-14 DIAGNOSIS — Z23 Encounter for immunization: Secondary | ICD-10-CM | POA: Diagnosis not present

## 2015-04-14 DIAGNOSIS — E119 Type 2 diabetes mellitus without complications: Secondary | ICD-10-CM

## 2015-04-14 DIAGNOSIS — E669 Obesity, unspecified: Secondary | ICD-10-CM

## 2015-04-14 DIAGNOSIS — E7800 Pure hypercholesterolemia, unspecified: Secondary | ICD-10-CM

## 2015-04-14 NOTE — Progress Notes (Signed)
Pre visit review using our clinic review tool, if applicable. No additional management support is needed unless otherwise documented below in the visit note. 

## 2015-04-14 NOTE — Progress Notes (Signed)
Subjective:    Patient ID: Gabriella Howe, female    DOB: 08/16/1934, 81 y.o.   MRN: [id number]  HPI  Here for annual medicare wellness visit as well as chronic/acute medical problems as well as annual preventative exam  I have personally reviewed the Medicare Annual Wellness questionnaire and have noted 1. The patient's medical and social history 2. Their use of alcohol, tobacco or illicit drugs 3. Their current medications and supplements 4. The patient's functional ability including ADL's, fall risks, home safety risks and hearing or visual             impairment. 5. Diet and physical activities 6. Evidence for depression or mood disorders  The patients weight, height, BMI have been recorded in the chart and visual acuity is per eye clinic.  I have made referrals, counseling and provided education to the patient based review of the above and I have provided the pt with a written personalized care plan for preventive services. Reviewed and updated provider list, see scanned forms.  Has been feeling pretty good   Gets by the best she can with back and leg pain that is chronic  She does all her housework  Lifts 25 lb bags a lot -fairly strong Walks up 8 steps to get into her house No extra exercise   Wt is up 1 lb with bmi of 39  Has not changed  Not making effort to loose   See scanned forms.  Routine anticipatory guidance given to patient.  See health maintenance. Colon cancer screening 5/16- adenomatous polyps - 3 year recall  Breast cancer screening mm 9/13 nl - does not want to do any more - would not tx breast cancer if she had it  Self breast exam- no lumps or changes  No B ca in family  Flu vaccine -will get today Tetanus vaccine - allergic to Td  Pneumovax 9/15 - had both and up to date  Zoster vaccine- declines due to cost  (had shingles when she was young) dexa 2001 -nl range , no falls or fractures , takes ca and D and stays active - declines further dexa    Advance directive has a living will and POA  Cognitive function addressed- see scanned forms- and if abnormal then additional documentation follows.  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   ROS: See HPI.  Otherwise negative.    bp is stable today  No cp or palpitations or headaches or edema  No side effects to medicines  BP Readings from Last 3 Encounters:  04/14/15 126/72  12/14/14 128/70  09/11/14 128/70     DM2 Lab Results  Component Value Date   HGBA1C 7.6* 03/31/2015   This is up from 7.2 Declines addition of any medication  She thinks she can change her habits to get it down  She has a sweet tooth - thinks she can control  She does not check her blood sugar often - when she does 120 or less in am / up to 180 after eating  Last eye exam- was 11/16 - and she also had cataract surgery which went fairly  No retinopathy  She is having problems with tear ducts getting blocked   Hyperlipidemia  Lab Results  Component Value Date   CHOL 129 03/31/2015   CHOL 115 06/03/2014  CHOL 129 11/21/2013   Lab Results  Component Value Date   HDL 52.90 03/31/2015   HDL 55.80 06/03/2014   HDL 49.90 11/21/2013   Lab Results  Component Value Date   LDLCALC 47 03/31/2015   LDLCALC 42 06/03/2014   LDLCALC 48 11/21/2013   Lab Results  Component Value Date   TRIG 148.0 03/31/2015   TRIG 87.0 06/03/2014   TRIG 155.0* 11/21/2013   Lab Results  Component Value Date   CHOLHDL 2 03/31/2015   CHOLHDL 2 06/03/2014   CHOLHDL 3 11/21/2013   No results found for: LDLDIRECT   Patient Active Problem List   Diagnosis Date Noted  . Routine general medical examination at a health care facility 04/14/2015  . Back pain 07/28/2014  . Knee pain 07/28/2014  . Encounter for Medicare annual wellness exam 11/19/2012  . History of kidney stones 11/19/2012  .  Abnormal mammogram 12/21/2011  . Kidney stone 06/07/2011  . Other screening mammogram 08/10/2010  . DIASTOLIC HEART FAILURE, CHRONIC 07/29/2009  . DYSPNEA 07/23/2009  . HYPERCHOLESTEROLEMIA, PURE 09/13/2006  . FLUID RETENTION 09/10/2006  . Obesity 09/10/2006  . CARPAL TUNNEL SYNDROME 09/10/2006  . Essential hypertension 09/10/2006  . DIASTOLIC DYSFUNCTION [id number]  . REACTIVE AIRWAY DISEASE 09/10/2006  . GERD 09/10/2006  . ROSACEA 09/10/2006  . Degeneration of lumbar or lumbosacral intervertebral disc 09/10/2006  . SLEEP APNEA 09/10/2006  . COLONIC POLYPS, HX OF 09/10/2006  . Diabetes type 2, controlled ([place]) 09/06/2006   Past Medical History  Diagnosis Date  . CHF (congestive heart failure) (HCC)     diastolic  . Heart disease, unspecified   . Hypertension   . Hyperlipidemia   . Chest pain, unspecified   . Shortness of breath   . Fluid overload   . GERD (gastroesophageal reflux disease)   . Obesity, unspecified   . Diabetes mellitus     type II  . Unspecified asthma(493.90)   . Benign neoplasm of colon   . Unspecified sleep apnea   . Carpal tunnel syndrome   . Rosacea   . Degeneration of intervertebral disc, site unspecified   . Adenomatous colon polyp 2003  . Encounter for long-term (current) use of other medications   . Kidney stone 3/12    hosp/ with stent   Past Surgical History  Procedure Laterality Date  . Abdominal hysterectomy      partial, fibroids  . Esophagogastroduodenoscopy  2000  . Umbilical hernia repair  01/2004  . Ureteral stent placement  2/13    for L sided stone/ ecoli sepsis   Social History  Substance Use Topics  . Smoking status: Never Smoker   . Smokeless tobacco: None  . Alcohol Use: No   No family history on file. Allergies  Allergen Reactions  . Rofecoxib     REACTION: reaction not known  . Tetanus-Diphtheria Toxoids Td     REACTION: sick with fever  . Antihistamines, Chlorpheniramine-Type Palpitations  . Sulfa  Antibiotics Rash   Current Outpatient Prescriptions on File Prior to Visit  Medication Sig Dispense Refill  . Ascorbic Acid (VITAMIN C) 500 MG tablet Take 500 mg by mouth daily.      . Aspirin-Caffeine (BC FAST PAIN RELIEF PO) Take by mouth as directed.      . benazepril (LOTENSIN) 20 MG tablet Take 1 tablet by mouth  daily 90 tablet 3  . CALCIUM-VITAMIN D PO Take 1 tablet by mouth 2 (two) times daily.     . furosemide (LASIX) 40  MG tablet Take one-half tablet by  mouth daily 45 tablet 3  . glucose blood (BAYER CONTOUR TEST) test strip Use to check blood sugar once daily as directed for DM 250.0 100 each 1  . Magnesium 250 MG TABS Take by mouth daily.    . metFORMIN (GLUCOPHAGE) 1000 MG tablet Take 1 tablet (1,000 mg total) by mouth 2 (two) times daily with a meal. 180 tablet 3  . metroNIDAZOLE (METROGEL) 0.75 % gel Apply topically daily. To affected area on face.Rosacea. 45 g 3  . MICROLET LANCETS MISC Check blood sugar once daily as directed for DM 250.0 100 each 1  . Multiple Vitamin (MULTIVITAMIN) capsule Take 1 capsule by mouth daily.      Marland Kitchen olmesartan (BENICAR) 20 MG tablet Take 20 mg by mouth daily.     . Omega-3 Fatty Acids (FISH OIL PO) Take 1 capsule by mouth daily.     Marland Kitchen omeprazole (PRILOSEC) 20 MG capsule Take 1 capsule by mouth  daily 90 capsule 3  . Pseudoephedrine HCl (SINUS & ALLERGY 12 HOUR PO) Take by mouth as directed.      . simvastatin (ZOCOR) 40 MG tablet Take 1 tablet by mouth at  bedtime 90 tablet 3  . triamcinolone cream (KENALOG) 0.1 % Apply 1 application topically 2 (two) times daily. Apply to affected areas twice daily. 30 g 0  . vitamin E 400 UNIT capsule Take 400 Units by mouth daily.       No current facility-administered medications on file prior to visit.    Review of Systems    Review of Systems  Constitutional: Negative for fever, appetite change, fatigue and unexpected weight change.  Eyes: Negative for pain and visual disturbance.  Respiratory:  Negative for cough and shortness of breath.   Cardiovascular: Negative for cp or palpitations    Gastrointestinal: Negative for nausea, diarrhea and constipation.  Genitourinary: Negative for urgency and frequency.  Skin: Negative for pallor or rash   MSK : pos for chronic back pain  Neurological: Negative for weakness, light-headedness, numbness and headaches.  Hematological: Negative for adenopathy. Does not bruise/bleed easily.  Psychiatric/Behavioral: Negative for dysphoric mood. The patient is not nervous/anxious.      Objective:   Physical Exam  Constitutional: She appears well-developed and well-nourished. No distress.  obese and well appearing   HENT:  Head: Normocephalic and atraumatic.  Right Ear: External ear normal.  Left Ear: External ear normal.  Mouth/Throat: Oropharynx is clear and moist.  Eyes: Conjunctivae and EOM are normal. Pupils are equal, round, and reactive to light. No scleral icterus.  Neck: Normal range of motion. Neck supple. No JVD present. Carotid bruit is not present. No thyromegaly present.  Cardiovascular: Normal rate, regular rhythm, normal heart sounds and intact distal pulses.  Exam reveals no gallop.   Pulmonary/Chest: Effort normal and breath sounds normal. No respiratory distress. She has no wheezes. She exhibits no tenderness.  Abdominal: Soft. Bowel sounds are normal. She exhibits no distension, no abdominal bruit and no mass. There is no tenderness.  Genitourinary: No breast swelling, tenderness, discharge or bleeding.  Breast exam: No mass, nodules, thickening, tenderness, bulging, retraction, inflamation, nipple discharge or skin changes noted.  No axillary or clavicular LA.      Musculoskeletal: Normal range of motion. She exhibits no edema or tenderness.  Lymphadenopathy:    She has no cervical adenopathy.  Neurological: She is alert. She has normal reflexes. No cranial nerve deficit. She exhibits normal muscle tone. Coordination  normal.    Skin: Skin is warm and dry. No rash noted. No erythema. No pallor.  Psychiatric: She has a normal mood and affect.          Assessment & Plan:   Problem List Items Addressed This Visit      Cardiovascular and Mediastinum   Essential hypertension - Primary    bp in fair control at this time  BP Readings from Last 1 Encounters:  04/14/15 126/72   No changes needed Disc lifstyle change with low sodium diet and exercise  Labs reviewed Wt loss enc        Endocrine   Diabetes type 2, controlled ([place])    Lab Results  Component Value Date   HGBA1C 7.6* 03/31/2015   This is up  Pt still declines any additional medication  Disc risks of uncontrolled DM Will work on diet/exercise and f/u 3 mo with lab prior Enc her to review DM ed materials         Other   Encounter for Medicare annual wellness exam    Reviewed health habits including diet and exercise and skin cancer prevention Reviewed appropriate screening tests for age  Also reviewed health mt list, fam hx and immunization status , as well as social and family history   See HPI Labs reviewed Please work on diabetes control with better diet/exercise and weight loss  Flu shot today       HYPERCHOLESTEROLEMIA, PURE    Disc goals for lipids and reasons to control them Rev labs with pt-at goal with zocor and diet  Rev low sat fat diet in detail       Obesity    Discussed how this problem influences overall health and the risks it imposes  Reviewed plan for weight loss with lower calorie diet (via better food choices and also portion control or program like weight watchers) and exercise building up to or more than 30 minutes 5 days per week including some aerobic activity         Routine general medical examination at a health care facility    Reviewed health habits including diet and exercise and skin cancer prevention Reviewed appropriate screening tests for age  Also reviewed health mt list, fam hx and  immunization status , as well as social and family history   See HPI Labs reviewed Please work on diabetes control with better diet/exercise and weight loss  Flu shot today        Other Visit Diagnoses    Need for influenza vaccination        Relevant Orders    Flu Vaccine QUAD 36+ mos PF IM (Fluarix & Fluzone Quad PF) (Completed)

## 2015-04-14 NOTE — Patient Instructions (Signed)
Please work on diabetes control with better diet/exercise and weight loss  Flu shot today  Follow up in 3 months with labs prior

## 2015-04-15 NOTE — Assessment & Plan Note (Signed)
bp in fair control at this time  BP Readings from Last 1 Encounters:  04/14/15 126/72   No changes needed Disc lifstyle change with low sodium diet and exercise  Labs reviewed Wt loss enc

## 2015-04-15 NOTE — Assessment & Plan Note (Signed)
Discussed how this problem influences overall health and the risks it imposes  Reviewed plan for weight loss with lower calorie diet (via better food choices and also portion control or program like weight watchers) and exercise building up to or more than 30 minutes 5 days per week including some aerobic activity    

## 2015-04-15 NOTE — Assessment & Plan Note (Signed)
Disc goals for lipids and reasons to control them Rev labs with pt-at goal with zocor and diet  Rev low sat fat diet in detail

## 2015-04-15 NOTE — Assessment & Plan Note (Signed)
Lab Results  Component Value Date   HGBA1C 7.6* 03/31/2015   This is up  Pt still declines any additional medication  Disc risks of uncontrolled DM Will work on diet/exercise and f/u 3 mo with lab prior Enc her to review DM ed materials

## 2015-04-15 NOTE — Assessment & Plan Note (Signed)
Reviewed health habits including diet and exercise and skin cancer prevention Reviewed appropriate screening tests for age  Also reviewed health mt list, fam hx and immunization status , as well as social and family history   See HPI Labs reviewed Please work on diabetes control with better diet/exercise and weight loss  Flu shot today

## 2015-07-06 ENCOUNTER — Other Ambulatory Visit: Payer: Medicare Other

## 2015-07-08 ENCOUNTER — Other Ambulatory Visit (INDEPENDENT_AMBULATORY_CARE_PROVIDER_SITE_OTHER): Payer: Medicare Other

## 2015-07-08 DIAGNOSIS — E119 Type 2 diabetes mellitus without complications: Secondary | ICD-10-CM | POA: Diagnosis not present

## 2015-07-08 LAB — HEMOGLOBIN A1C: Hgb A1c MFr Bld: 7.9 % — ABNORMAL HIGH (ref 4.6–6.5)

## 2015-07-11 ENCOUNTER — Other Ambulatory Visit: Payer: Self-pay | Admitting: Family Medicine

## 2015-07-13 ENCOUNTER — Encounter: Payer: Self-pay | Admitting: Family Medicine

## 2015-07-13 ENCOUNTER — Ambulatory Visit (INDEPENDENT_AMBULATORY_CARE_PROVIDER_SITE_OTHER): Payer: Medicare Other | Admitting: Family Medicine

## 2015-07-13 VITALS — BP 126/78 | HR 86 | Temp 97.7°F | Ht 61.0 in | Wt 209.2 lb

## 2015-07-13 DIAGNOSIS — I10 Essential (primary) hypertension: Secondary | ICD-10-CM

## 2015-07-13 DIAGNOSIS — E119 Type 2 diabetes mellitus without complications: Secondary | ICD-10-CM | POA: Diagnosis not present

## 2015-07-13 MED ORDER — GLIPIZIDE ER 5 MG PO TB24: 5.0000 mg | ORAL_TABLET | Freq: Every day | ORAL | Status: DC

## 2015-07-13 NOTE — Progress Notes (Signed)
Pre visit review using our clinic review tool, if applicable. No additional management support is needed unless otherwise documented below in the visit note. 

## 2015-07-13 NOTE — Assessment & Plan Note (Signed)
Not well controlled now Lab Results  Component Value Date   HGBA1C 7.9* 07/08/2015   This has been trending up  Pt feels she is not motivated enough to eat a dm diet or exercise (has had education)-disc opt for exercise with back pain Add glipizide ER 5 mg at breakfast-enc to update if side eff or hypoglycemia and begin checking glucose readings more often  Enc DM diet/exercise and wt loss F/u 3 mo with lab prior

## 2015-07-13 NOTE — Progress Notes (Signed)
Subjective:    Patient ID: Gabriella Howe, female    DOB: 10/19/1934, 81 y.o.   MRN: [id number]  HPI Is here for f/u of chronic medical problems  Thinks she is doing fine    Still not eating right and exercising  She feels tired all the time  Denies depression  Thinks it is related to age and chronic back pain   She does some chair exercise  ? If she could use a recumbent bike  Has to walk stairs  Also carries heavy things  Not interested in water exercise    bp is stable today  No cp or palpitations or headaches or edema  No side effects to medicines  BP Readings from Last 3 Encounters:  07/13/15 126/78  04/14/15 126/72  12/14/14 128/70      Wt is stable with bmi of 39 This hurts her back more   Diabetes Home sugar results -in the am usually good -in the one-teens, 2 hours after she eats 180s-200  DM diet -kind of watching it/not all the time (does cook differently) Exercise -limited  Symptoms  A1C last  Lab Results  Component Value Date   HGBA1C 7.9* 07/08/2015  this is up from 7.6  She is on metformin-now open to adding agent  No problems with medications  Renal protection- on arb Last eye exam 11/16 no retinopathy    Patient Active Problem List   Diagnosis Date Noted  . Routine general medical examination at a health care facility 04/14/2015  . Back pain 07/28/2014  . Knee pain 07/28/2014  . Encounter for Medicare annual wellness exam 11/19/2012  . History of kidney stones 11/19/2012  . Abnormal mammogram 12/21/2011  . Kidney stone 06/07/2011  . Other screening mammogram 08/10/2010  . DIASTOLIC HEART FAILURE, CHRONIC 07/29/2009  . DYSPNEA 07/23/2009  . HYPERCHOLESTEROLEMIA, PURE 09/13/2006  . FLUID RETENTION 09/10/2006  . Obesity 09/10/2006  . CARPAL TUNNEL SYNDROME 09/10/2006  . Essential hypertension 09/10/2006  . DIASTOLIC DYSFUNCTION [id number]  . REACTIVE AIRWAY DISEASE 09/10/2006  . GERD 09/10/2006  . ROSACEA 09/10/2006  . Degeneration  of lumbar or lumbosacral intervertebral disc 09/10/2006  . SLEEP APNEA 09/10/2006  . COLONIC POLYPS, HX OF 09/10/2006  . Diabetes type 2, controlled ([place]) 09/06/2006   Past Medical History  Diagnosis Date  . CHF (congestive heart failure) (HCC)     diastolic  . Heart disease, unspecified   . Hypertension   . Hyperlipidemia   . Chest pain, unspecified   . Shortness of breath   . Fluid overload   . GERD (gastroesophageal reflux disease)   . Obesity, unspecified   . Diabetes mellitus     type II  . Unspecified asthma(493.90)   . Benign neoplasm of colon   . Unspecified sleep apnea   . Carpal tunnel syndrome   . Rosacea   . Degeneration of intervertebral disc, site unspecified   . Adenomatous colon polyp 2003  . Encounter for long-term (current) use of other medications   . Kidney stone 3/12    hosp/ with stent   Past Surgical History  Procedure Laterality Date  . Abdominal hysterectomy      partial, fibroids  . Esophagogastroduodenoscopy  2000  . Umbilical hernia repair  01/2004  . Ureteral stent placement  2/13    for L sided stone/ ecoli sepsis   Social History  Substance Use Topics  . Smoking status: Never Smoker   . Smokeless tobacco: None  . Alcohol Use:  No   No family history on file. Allergies  Allergen Reactions  . Rofecoxib     REACTION: reaction not known  . Tetanus-Diphtheria Toxoids Td     REACTION: sick with fever  . Antihistamines, Chlorpheniramine-Type Palpitations  . Sulfa Antibiotics Rash   Current Outpatient Prescriptions on File Prior to Visit  Medication Sig Dispense Refill  . Ascorbic Acid (VITAMIN C) 500 MG tablet Take 500 mg by mouth daily.      . Aspirin-Caffeine (BC FAST PAIN RELIEF PO) Take by mouth as directed.      . benazepril (LOTENSIN) 20 MG tablet Take 1 tablet by mouth  daily 90 tablet 3  . CALCIUM-VITAMIN D PO Take 1 tablet by mouth 2 (two) times daily.     . furosemide (LASIX) 40 MG tablet Take one-half tablet by  mouth  daily 45 tablet 3  . glucose blood (BAYER CONTOUR TEST) test strip Use to check blood sugar once daily as directed for DM 250.0 100 each 1  . Magnesium 250 MG TABS Take by mouth daily.    Marland Kitchen MICROLET LANCETS MISC Check blood sugar once daily as directed for DM 250.0 100 each 1  . Multiple Vitamin (MULTIVITAMIN) capsule Take 1 capsule by mouth daily.      Marland Kitchen olmesartan (BENICAR) 20 MG tablet Take 20 mg by mouth daily.     . Omega-3 Fatty Acids (FISH OIL PO) Take 1 capsule by mouth daily.     Marland Kitchen omeprazole (PRILOSEC) 20 MG capsule Take 1 capsule by mouth  daily 90 capsule 3  . Pseudoephedrine HCl (SINUS & ALLERGY 12 HOUR PO) Take by mouth as directed.      . simvastatin (ZOCOR) 40 MG tablet Take 1 tablet by mouth at  bedtime 90 tablet 3  . triamcinolone cream (KENALOG) 0.1 % Apply 1 application topically 2 (two) times daily. Apply to affected areas twice daily. 30 g 0  . vitamin E 400 UNIT capsule Take 400 Units by mouth daily.      . metFORMIN (GLUCOPHAGE) 1000 MG tablet Take 1 tablet (1,000 mg total) by mouth 2 (two) times daily with a meal. 180 tablet 3   No current facility-administered medications on file prior to visit.    Review of Systems Review of Systems  Constitutional: Negative for fever, appetite change,  and unexpected weight change. pos for some fatigue (of note-still working) Eyes: Negative for pain and visual disturbance.  Respiratory: Negative for cough and shortness of breath.   Cardiovascular: Negative for cp or palpitations    Gastrointestinal: Negative for nausea, diarrhea and constipation.  Genitourinary: Negative for urgency and frequency.  Skin: Negative for pallor or rash   MSK pos for chronic back pain  Neurological: Negative for weakness, light-headedness, numbness and headaches.  Hematological: Negative for adenopathy. Does not bruise/bleed easily.  Psychiatric/Behavioral: Negative for dysphoric mood. The patient is not nervous/anxious.         Objective:    Physical Exam  Constitutional: She appears well-developed and well-nourished. No distress.  obese and well appearing   HENT:  Head: Normocephalic and atraumatic.  Mouth/Throat: Oropharynx is clear and moist.  Eyes: Conjunctivae and EOM are normal. Pupils are equal, round, and reactive to light.  Neck: Normal range of motion. Neck supple. No JVD present. Carotid bruit is not present. No thyromegaly present.  Cardiovascular: Normal rate, regular rhythm, normal heart sounds and intact distal pulses.  Exam reveals no gallop.   Pulmonary/Chest: Effort normal and breath sounds normal.  No respiratory distress. She has no wheezes. She has no rales.  No crackles  Abdominal: Soft. Bowel sounds are normal. She exhibits no distension, no abdominal bruit and no mass. There is no tenderness.  Musculoskeletal: She exhibits no edema.  Lymphadenopathy:    She has no cervical adenopathy.  Neurological: She is alert. She has normal reflexes.  Skin: Skin is warm and dry. No rash noted.  Psychiatric: She has a normal mood and affect.          Assessment & Plan:   Problem List Items Addressed This Visit      Cardiovascular and Mediastinum   Essential hypertension    bp in fair control at this time  BP Readings from Last 1 Encounters:  07/13/15 126/78   No changes needed Disc lifstyle change with low sodium diet and exercise          Endocrine   Diabetes type 2, controlled ([place]) - Primary    Not well controlled now Lab Results  Component Value Date   HGBA1C 7.9* 07/08/2015   This has been trending up  Pt feels she is not motivated enough to eat a dm diet or exercise (has had education)-disc opt for exercise with back pain Add glipizide ER 5 mg at breakfast-enc to update if side eff or hypoglycemia and begin checking glucose readings more often  Enc DM diet/exercise and wt loss F/u 3 mo with lab prior       Relevant Medications   glipiZIDE (GLUCOTROL XL) 5 MG 24 hr tablet

## 2015-07-13 NOTE — Patient Instructions (Signed)
Take metformin as directed -continue twice daily  Add glipizide XL 5 mg each day with breakfast If blood sugars get too low let me know  Don't skip meals  Stay active  Try to stick with a diabetic diet -this may help you loose weight also  Follow up in 3 months with labs prior

## 2015-07-13 NOTE — Assessment & Plan Note (Signed)
bp in fair control at this time  BP Readings from Last 1 Encounters:  07/13/15 126/78   No changes needed Disc lifstyle change with low sodium diet and exercise

## 2015-07-30 DIAGNOSIS — E119 Type 2 diabetes mellitus without complications: Secondary | ICD-10-CM | POA: Diagnosis not present

## 2015-07-30 LAB — HM DIABETES EYE EXAM

## 2015-08-16 ENCOUNTER — Other Ambulatory Visit: Payer: Self-pay | Admitting: *Deleted

## 2015-08-16 MED ORDER — GLIPIZIDE ER 5 MG PO TB24: 5.0000 mg | ORAL_TABLET | Freq: Every day | ORAL | Status: DC

## 2015-08-16 MED ORDER — SIMVASTATIN 40 MG PO TABS: ORAL_TABLET | ORAL | Status: DC

## 2015-08-16 MED ORDER — OMEPRAZOLE 20 MG PO CPDR: DELAYED_RELEASE_CAPSULE | ORAL | Status: DC

## 2015-08-16 MED ORDER — METFORMIN HCL 1000 MG PO TABS: ORAL_TABLET | ORAL | Status: DC

## 2015-08-16 MED ORDER — FUROSEMIDE 40 MG PO TABS: ORAL_TABLET | ORAL | Status: DC

## 2015-08-16 MED ORDER — BENAZEPRIL HCL 20 MG PO TABS: ORAL_TABLET | ORAL | Status: DC

## 2015-08-16 NOTE — Telephone Encounter (Signed)
Pt sent me a fax saying she need refills of meds, done and pt notified

## 2015-10-12 ENCOUNTER — Other Ambulatory Visit (INDEPENDENT_AMBULATORY_CARE_PROVIDER_SITE_OTHER): Payer: Medicare Other

## 2015-10-12 DIAGNOSIS — E119 Type 2 diabetes mellitus without complications: Secondary | ICD-10-CM | POA: Diagnosis not present

## 2015-10-12 DIAGNOSIS — I10 Essential (primary) hypertension: Secondary | ICD-10-CM | POA: Diagnosis not present

## 2015-10-12 LAB — COMPREHENSIVE METABOLIC PANEL
ALBUMIN: 4.3 g/dL (ref 3.5–5.2)
ALT: 16 U/L (ref 0–35)
AST: 17 U/L (ref 0–37)
Alkaline Phosphatase: 54 U/L (ref 39–117)
BILIRUBIN TOTAL: 0.9 mg/dL (ref 0.2–1.2)
BUN: 20 mg/dL (ref 6–23)
CALCIUM: 10 mg/dL (ref 8.4–10.5)
CHLORIDE: 106 meq/L (ref 96–112)
CO2: 26 meq/L (ref 19–32)
CREATININE: 0.85 mg/dL (ref 0.40–1.20)
GFR: 68.59 mL/min (ref >60.00)
Glucose, Bld: 138 mg/dL — ABNORMAL HIGH (ref 70–99)
Potassium: 4.1 mEq/L (ref 3.5–5.1)
SODIUM: 141 meq/L (ref 135–145)
Total Protein: 6.8 g/dL (ref 6.0–8.3)

## 2015-10-12 LAB — HEMOGLOBIN A1C: Hgb A1c MFr Bld: 6.8 % — ABNORMAL HIGH (ref 4.6–6.5)

## 2015-10-12 LAB — LIPID PANEL
CHOL/HDL RATIO: 2
CHOLESTEROL: 118 mg/dL (ref 0–200)
HDL: 52.9 mg/dL (ref >39.00)
LDL CALC: 44 mg/dL (ref 0–99)
NonHDL: 65.02
TRIGLYCERIDES: 104 mg/dL (ref 0.0–149.0)
VLDL: 20.8 mg/dL (ref 0.0–40.0)

## 2015-10-15 ENCOUNTER — Ambulatory Visit (INDEPENDENT_AMBULATORY_CARE_PROVIDER_SITE_OTHER): Payer: Medicare Other | Admitting: Family Medicine

## 2015-10-15 ENCOUNTER — Encounter: Payer: Self-pay | Admitting: Family Medicine

## 2015-10-15 VITALS — BP 138/78 | HR 83 | Temp 98.4°F | Ht 61.0 in | Wt 211.0 lb

## 2015-10-15 DIAGNOSIS — E669 Obesity, unspecified: Secondary | ICD-10-CM | POA: Diagnosis not present

## 2015-10-15 DIAGNOSIS — I10 Essential (primary) hypertension: Secondary | ICD-10-CM

## 2015-10-15 DIAGNOSIS — E7800 Pure hypercholesterolemia, unspecified: Secondary | ICD-10-CM | POA: Diagnosis not present

## 2015-10-15 DIAGNOSIS — E119 Type 2 diabetes mellitus without complications: Secondary | ICD-10-CM | POA: Diagnosis not present

## 2015-10-15 NOTE — Progress Notes (Signed)
Pre visit review using our clinic review tool, if applicable. No additional management support is needed unless otherwise documented below in the visit note. 

## 2015-10-15 NOTE — Patient Instructions (Addendum)
Change some blood sugar checks to pm 2 hours after evening meal  And any time you need to Follow up in January for your annual exam  Keep working on low sugar and low fat diet and stay active

## 2015-10-15 NOTE — Progress Notes (Signed)
Subjective:    Patient ID: Gabriella Howe, female    DOB: 04-Feb-1935, 81 y.o.   MRN: [id number]  HPI  Here for f/u of chronic health problems   Doing ok overall  Trying to take care of herself   Wt Readings from Last 3 Encounters:  10/15/15 211 lb (95.709 kg)  07/13/15 209 lb 4 oz (94.915 kg)  04/14/15 209 lb 4 oz (94.915 kg)   bmi is 39.8 Eating better than she was  For exercise- regular activity- hauls pet food/housework/ lots of stairs  Back bothers her   bp is stable today  No cp or palpitations or headaches or edema  No side effects to medicines  BP Readings from Last 3 Encounters:  10/15/15 138/78  07/13/15 126/78  04/14/15 126/72      Diabetes Home sugar results (careful to eat regularly to avoid hypoglycemia), no high readings   Usually 100-113 in am, does not check in afternoon  DM diet - is better (less sweets) and also eating more veg and chicken  Exercise -limited due to back pain  Symptoms-none  A1C last  Lab Results  Component Value Date   HGBA1C 6.8* 10/12/2015  this is down from 7.9! Added back glipzide ER- is careful not go get hypogycemic   No problems with medications  Renal protection- benicar/ ARB  Last eye exam 5/17   Hx of hyperlipidemia Lab Results  Component Value Date   CHOL 118 10/12/2015   HDL 52.90 10/12/2015   LDLCALC 44 10/12/2015   TRIG 104.0 10/12/2015   CHOLHDL 2 10/12/2015   on zocor and diet  Does not cook with oil/grease No red meat   Patient Active Problem List   Diagnosis Date Noted  . Routine general medical examination at a health care facility 04/14/2015  . Back pain 07/28/2014  . Knee pain 07/28/2014  . Encounter for Medicare annual wellness exam 11/19/2012  . History of kidney stones 11/19/2012  . Abnormal mammogram 12/21/2011  . Kidney stone 06/07/2011  . Other screening mammogram 08/10/2010  . DIASTOLIC HEART FAILURE, CHRONIC 07/29/2009  . DYSPNEA 07/23/2009  . HYPERCHOLESTEROLEMIA, PURE 09/13/2006    . FLUID RETENTION 09/10/2006  . Obesity 09/10/2006  . CARPAL TUNNEL SYNDROME 09/10/2006  . Essential hypertension 09/10/2006  . DIASTOLIC DYSFUNCTION [id number]  . REACTIVE AIRWAY DISEASE 09/10/2006  . GERD 09/10/2006  . ROSACEA 09/10/2006  . Degeneration of lumbar or lumbosacral intervertebral disc 09/10/2006  . SLEEP APNEA 09/10/2006  . COLONIC POLYPS, HX OF 09/10/2006  . Diabetes type 2, controlled ([place]) 09/06/2006   Past Medical History  Diagnosis Date  . CHF (congestive heart failure) (HCC)     diastolic  . Heart disease, unspecified   . Hypertension   . Hyperlipidemia   . Chest pain, unspecified   . Shortness of breath   . Fluid overload   . GERD (gastroesophageal reflux disease)   . Obesity, unspecified   . Diabetes mellitus     type II  . Unspecified asthma(493.90)   . Benign neoplasm of colon   . Unspecified sleep apnea   . Carpal tunnel syndrome   . Rosacea   . Degeneration of intervertebral disc, site unspecified   . Adenomatous colon polyp 2003  . Encounter for long-term (current) use of other medications   . Kidney stone 3/12    hosp/ with stent   Past Surgical History  Procedure Laterality Date  . Abdominal hysterectomy      partial, fibroids  .  Esophagogastroduodenoscopy  2000  . Umbilical hernia repair  01/2004  . Ureteral stent placement  2/13    for L sided stone/ ecoli sepsis   Social History  Substance Use Topics  . Smoking status: Never Smoker   . Smokeless tobacco: None  . Alcohol Use: No   No family history on file. Allergies  Allergen Reactions  . Rofecoxib     REACTION: reaction not known  . Tetanus-Diphtheria Toxoids Td     REACTION: sick with fever  . Antihistamines, Chlorpheniramine-Type Palpitations  . Sulfa Antibiotics Rash   Current Outpatient Prescriptions on File Prior to Visit  Medication Sig Dispense Refill  . Ascorbic Acid (VITAMIN C) 500 MG tablet Take 500 mg by mouth daily.      . Aspirin-Caffeine (BC FAST PAIN  RELIEF PO) Take by mouth as directed.      . benazepril (LOTENSIN) 20 MG tablet Take 1 tablet by mouth  daily 90 tablet 1  . CALCIUM-VITAMIN D PO Take 1 tablet by mouth 2 (two) times daily.     . furosemide (LASIX) 40 MG tablet Take one-half tablet by  mouth daily 45 tablet 1  . glipiZIDE (GLUCOTROL XL) 5 MG 24 hr tablet Take 1 tablet (5 mg total) by mouth daily with breakfast. 90 tablet 1  . glucose blood (BAYER CONTOUR TEST) test strip Use to check blood sugar once daily as directed for DM 250.0 100 each 1  . Magnesium 250 MG TABS Take by mouth daily.    . metFORMIN (GLUCOPHAGE) 1000 MG tablet Take 1 tablet by mouth  twice a day with meals 180 tablet 1  . MICROLET LANCETS MISC Check blood sugar once daily as directed for DM 250.0 100 each 1  . Multiple Vitamin (MULTIVITAMIN) capsule Take 1 capsule by mouth daily.      Marland Kitchen olmesartan (BENICAR) 20 MG tablet Take 20 mg by mouth daily.     . Omega-3 Fatty Acids (FISH OIL PO) Take 1 capsule by mouth daily.     Marland Kitchen omeprazole (PRILOSEC) 20 MG capsule Take 1 capsule by mouth  daily 90 capsule 1  . Pseudoephedrine HCl (SINUS & ALLERGY 12 HOUR PO) Take by mouth as directed.      . simvastatin (ZOCOR) 40 MG tablet Take 1 tablet by mouth at  bedtime 90 tablet 1  . triamcinolone cream (KENALOG) 0.1 % Apply 1 application topically 2 (two) times daily. Apply to affected areas twice daily. 30 g 0  . vitamin E 400 UNIT capsule Take 400 Units by mouth daily.       No current facility-administered medications on file prior to visit.    Review of Systems    Review of Systems  Constitutional: Negative for fever, appetite change, fatigue and unexpected weight change.  Eyes: Negative for pain and visual disturbance.  Respiratory: Negative for cough and shortness of breath.   Cardiovascular: Negative for cp or palpitations    Gastrointestinal: Negative for nausea, diarrhea and constipation.  Genitourinary: Negative for urgency and frequency.  Skin: Negative for  pallor or rash   MSK pos for chronic back pain  Neurological: Negative for weakness, light-headedness, numbness and headaches.  Hematological: Negative for adenopathy. Does not bruise/bleed easily.  Psychiatric/Behavioral: Negative for dysphoric mood. The patient is not nervous/anxious.      Objective:   Physical Exam  Constitutional: She appears well-developed and well-nourished. No distress.  obese and well appearing   HENT:  Head: Normocephalic and atraumatic.  Mouth/Throat: Oropharynx is  clear and moist.  Eyes: Conjunctivae and EOM are normal. Pupils are equal, round, and reactive to light.  Neck: Normal range of motion. Neck supple. No JVD present. Carotid bruit is not present. No thyromegaly present.  Cardiovascular: Normal rate, regular rhythm, normal heart sounds and intact distal pulses.  Exam reveals no gallop.   Pulmonary/Chest: Effort normal and breath sounds normal. No respiratory distress. She has no wheezes. She has no rales.  No crackles  Abdominal: Soft. Bowel sounds are normal. She exhibits no distension, no abdominal bruit and no mass. There is no tenderness.  Musculoskeletal: She exhibits no edema.  Poor rom LS  Lymphadenopathy:    She has no cervical adenopathy.  Neurological: She is alert. She has normal reflexes.  Skin: Skin is warm and dry. No rash noted.  Psychiatric: She has a normal mood and affect.          Assessment & Plan:   Problem List Items Addressed This Visit      Cardiovascular and Mediastinum   Essential hypertension - Primary    bp in fair control at this time  BP Readings from Last 1 Encounters:  10/15/15 138/78   No changes needed Disc lifstyle change with low sodium diet and exercise  Labs reviewed  Enc wt loss        Endocrine   Diabetes type 2, controlled ([place])    Much improved with addn of glipizide and imp diet/more regular eating  Lab Results  Component Value Date   HGBA1C 6.8* 10/12/2015   Will continue to  follow F/u 6 mo  Enc wt loss         Other   Obesity    Discussed how this problem influences overall health and the risks it imposes  Reviewed plan for weight loss with lower calorie diet (via better food choices and also portion control or program like weight watchers) and exercise building up to or more than 30 minutes 5 days per week including some aerobic activity   Exercise is limited due to chronic back pain        HYPERCHOLESTEROLEMIA, PURE    Disc goals for lipids and reasons to control them Rev labs with pt Rev low sat fat diet in detail  Continue simvastatin

## 2015-10-16 NOTE — Assessment & Plan Note (Signed)
bp in fair control at this time  BP Readings from Last 1 Encounters:  10/15/15 138/78   No changes needed Disc lifstyle change with low sodium diet and exercise  Labs reviewed  Enc wt loss

## 2015-10-16 NOTE — Assessment & Plan Note (Signed)
Much improved with addn of glipizide and imp diet/more regular eating  Lab Results  Component Value Date   HGBA1C 6.8* 10/12/2015   Will continue to follow F/u 6 mo  Enc wt loss

## 2015-10-16 NOTE — Assessment & Plan Note (Signed)
Disc goals for lipids and reasons to control them Rev labs with pt Rev low sat fat diet in detail Continue simvastatin  

## 2015-10-16 NOTE — Assessment & Plan Note (Signed)
Discussed how this problem influences overall health and the risks it imposes  Reviewed plan for weight loss with lower calorie diet (via better food choices and also portion control or program like weight watchers) and exercise building up to or more than 30 minutes 5 days per week including some aerobic activity   Exercise is limited due to chronic back pain

## 2015-11-09 ENCOUNTER — Other Ambulatory Visit: Payer: Self-pay | Admitting: Family Medicine

## 2015-11-22 DIAGNOSIS — M5441 Lumbago with sciatica, right side: Secondary | ICD-10-CM | POA: Diagnosis not present

## 2015-11-22 DIAGNOSIS — M9901 Segmental and somatic dysfunction of cervical region: Secondary | ICD-10-CM | POA: Diagnosis not present

## 2015-11-22 DIAGNOSIS — M9904 Segmental and somatic dysfunction of sacral region: Secondary | ICD-10-CM | POA: Diagnosis not present

## 2015-11-22 DIAGNOSIS — M9903 Segmental and somatic dysfunction of lumbar region: Secondary | ICD-10-CM | POA: Diagnosis not present

## 2015-11-22 DIAGNOSIS — M9905 Segmental and somatic dysfunction of pelvic region: Secondary | ICD-10-CM | POA: Diagnosis not present

## 2015-11-24 DIAGNOSIS — M5441 Lumbago with sciatica, right side: Secondary | ICD-10-CM | POA: Diagnosis not present

## 2015-11-24 DIAGNOSIS — M9904 Segmental and somatic dysfunction of sacral region: Secondary | ICD-10-CM | POA: Diagnosis not present

## 2015-11-24 DIAGNOSIS — M9905 Segmental and somatic dysfunction of pelvic region: Secondary | ICD-10-CM | POA: Diagnosis not present

## 2015-11-24 DIAGNOSIS — M9903 Segmental and somatic dysfunction of lumbar region: Secondary | ICD-10-CM | POA: Diagnosis not present

## 2015-11-24 DIAGNOSIS — M9901 Segmental and somatic dysfunction of cervical region: Secondary | ICD-10-CM | POA: Diagnosis not present

## 2015-11-25 DIAGNOSIS — M9901 Segmental and somatic dysfunction of cervical region: Secondary | ICD-10-CM | POA: Diagnosis not present

## 2015-11-25 DIAGNOSIS — M5441 Lumbago with sciatica, right side: Secondary | ICD-10-CM | POA: Diagnosis not present

## 2015-11-25 DIAGNOSIS — M9905 Segmental and somatic dysfunction of pelvic region: Secondary | ICD-10-CM | POA: Diagnosis not present

## 2015-11-25 DIAGNOSIS — M9903 Segmental and somatic dysfunction of lumbar region: Secondary | ICD-10-CM | POA: Diagnosis not present

## 2015-11-25 DIAGNOSIS — M9904 Segmental and somatic dysfunction of sacral region: Secondary | ICD-10-CM | POA: Diagnosis not present

## 2015-11-30 DIAGNOSIS — M9901 Segmental and somatic dysfunction of cervical region: Secondary | ICD-10-CM | POA: Diagnosis not present

## 2015-11-30 DIAGNOSIS — M9904 Segmental and somatic dysfunction of sacral region: Secondary | ICD-10-CM | POA: Diagnosis not present

## 2015-11-30 DIAGNOSIS — M9903 Segmental and somatic dysfunction of lumbar region: Secondary | ICD-10-CM | POA: Diagnosis not present

## 2015-11-30 DIAGNOSIS — M5441 Lumbago with sciatica, right side: Secondary | ICD-10-CM | POA: Diagnosis not present

## 2015-11-30 DIAGNOSIS — M9905 Segmental and somatic dysfunction of pelvic region: Secondary | ICD-10-CM | POA: Diagnosis not present

## 2015-12-01 DIAGNOSIS — M9903 Segmental and somatic dysfunction of lumbar region: Secondary | ICD-10-CM | POA: Diagnosis not present

## 2015-12-01 DIAGNOSIS — M9905 Segmental and somatic dysfunction of pelvic region: Secondary | ICD-10-CM | POA: Diagnosis not present

## 2015-12-01 DIAGNOSIS — M9904 Segmental and somatic dysfunction of sacral region: Secondary | ICD-10-CM | POA: Diagnosis not present

## 2015-12-01 DIAGNOSIS — M5441 Lumbago with sciatica, right side: Secondary | ICD-10-CM | POA: Diagnosis not present

## 2015-12-06 DIAGNOSIS — M5441 Lumbago with sciatica, right side: Secondary | ICD-10-CM | POA: Diagnosis not present

## 2015-12-06 DIAGNOSIS — M9903 Segmental and somatic dysfunction of lumbar region: Secondary | ICD-10-CM | POA: Diagnosis not present

## 2015-12-06 DIAGNOSIS — M9905 Segmental and somatic dysfunction of pelvic region: Secondary | ICD-10-CM | POA: Diagnosis not present

## 2015-12-06 DIAGNOSIS — M9904 Segmental and somatic dysfunction of sacral region: Secondary | ICD-10-CM | POA: Diagnosis not present

## 2015-12-06 DIAGNOSIS — M9901 Segmental and somatic dysfunction of cervical region: Secondary | ICD-10-CM | POA: Diagnosis not present

## 2015-12-10 DIAGNOSIS — M9901 Segmental and somatic dysfunction of cervical region: Secondary | ICD-10-CM | POA: Diagnosis not present

## 2015-12-10 DIAGNOSIS — M9903 Segmental and somatic dysfunction of lumbar region: Secondary | ICD-10-CM | POA: Diagnosis not present

## 2015-12-10 DIAGNOSIS — M9905 Segmental and somatic dysfunction of pelvic region: Secondary | ICD-10-CM | POA: Diagnosis not present

## 2015-12-10 DIAGNOSIS — M9904 Segmental and somatic dysfunction of sacral region: Secondary | ICD-10-CM | POA: Diagnosis not present

## 2015-12-10 DIAGNOSIS — M5441 Lumbago with sciatica, right side: Secondary | ICD-10-CM | POA: Diagnosis not present

## 2015-12-13 DIAGNOSIS — M5441 Lumbago with sciatica, right side: Secondary | ICD-10-CM | POA: Diagnosis not present

## 2015-12-13 DIAGNOSIS — M9903 Segmental and somatic dysfunction of lumbar region: Secondary | ICD-10-CM | POA: Diagnosis not present

## 2015-12-13 DIAGNOSIS — M9904 Segmental and somatic dysfunction of sacral region: Secondary | ICD-10-CM | POA: Diagnosis not present

## 2015-12-13 DIAGNOSIS — M9901 Segmental and somatic dysfunction of cervical region: Secondary | ICD-10-CM | POA: Diagnosis not present

## 2015-12-13 DIAGNOSIS — M9905 Segmental and somatic dysfunction of pelvic region: Secondary | ICD-10-CM | POA: Diagnosis not present

## 2015-12-16 DIAGNOSIS — M9905 Segmental and somatic dysfunction of pelvic region: Secondary | ICD-10-CM | POA: Diagnosis not present

## 2015-12-16 DIAGNOSIS — M9901 Segmental and somatic dysfunction of cervical region: Secondary | ICD-10-CM | POA: Diagnosis not present

## 2015-12-16 DIAGNOSIS — M5441 Lumbago with sciatica, right side: Secondary | ICD-10-CM | POA: Diagnosis not present

## 2015-12-16 DIAGNOSIS — M9903 Segmental and somatic dysfunction of lumbar region: Secondary | ICD-10-CM | POA: Diagnosis not present

## 2015-12-16 DIAGNOSIS — M9904 Segmental and somatic dysfunction of sacral region: Secondary | ICD-10-CM | POA: Diagnosis not present

## 2015-12-20 DIAGNOSIS — M9901 Segmental and somatic dysfunction of cervical region: Secondary | ICD-10-CM | POA: Diagnosis not present

## 2015-12-20 DIAGNOSIS — M5441 Lumbago with sciatica, right side: Secondary | ICD-10-CM | POA: Diagnosis not present

## 2015-12-20 DIAGNOSIS — M9904 Segmental and somatic dysfunction of sacral region: Secondary | ICD-10-CM | POA: Diagnosis not present

## 2015-12-20 DIAGNOSIS — M9905 Segmental and somatic dysfunction of pelvic region: Secondary | ICD-10-CM | POA: Diagnosis not present

## 2015-12-20 DIAGNOSIS — M9903 Segmental and somatic dysfunction of lumbar region: Secondary | ICD-10-CM | POA: Diagnosis not present

## 2015-12-22 DIAGNOSIS — M9903 Segmental and somatic dysfunction of lumbar region: Secondary | ICD-10-CM | POA: Diagnosis not present

## 2015-12-22 DIAGNOSIS — M9904 Segmental and somatic dysfunction of sacral region: Secondary | ICD-10-CM | POA: Diagnosis not present

## 2015-12-22 DIAGNOSIS — M9905 Segmental and somatic dysfunction of pelvic region: Secondary | ICD-10-CM | POA: Diagnosis not present

## 2015-12-22 DIAGNOSIS — M5441 Lumbago with sciatica, right side: Secondary | ICD-10-CM | POA: Diagnosis not present

## 2015-12-22 DIAGNOSIS — M9901 Segmental and somatic dysfunction of cervical region: Secondary | ICD-10-CM | POA: Diagnosis not present

## 2015-12-23 DIAGNOSIS — M9901 Segmental and somatic dysfunction of cervical region: Secondary | ICD-10-CM | POA: Diagnosis not present

## 2015-12-23 DIAGNOSIS — M5441 Lumbago with sciatica, right side: Secondary | ICD-10-CM | POA: Diagnosis not present

## 2015-12-23 DIAGNOSIS — M9903 Segmental and somatic dysfunction of lumbar region: Secondary | ICD-10-CM | POA: Diagnosis not present

## 2015-12-23 DIAGNOSIS — M9905 Segmental and somatic dysfunction of pelvic region: Secondary | ICD-10-CM | POA: Diagnosis not present

## 2015-12-23 DIAGNOSIS — M9904 Segmental and somatic dysfunction of sacral region: Secondary | ICD-10-CM | POA: Diagnosis not present

## 2015-12-27 DIAGNOSIS — M9903 Segmental and somatic dysfunction of lumbar region: Secondary | ICD-10-CM | POA: Diagnosis not present

## 2015-12-27 DIAGNOSIS — M5441 Lumbago with sciatica, right side: Secondary | ICD-10-CM | POA: Diagnosis not present

## 2015-12-27 DIAGNOSIS — M9905 Segmental and somatic dysfunction of pelvic region: Secondary | ICD-10-CM | POA: Diagnosis not present

## 2015-12-27 DIAGNOSIS — M9901 Segmental and somatic dysfunction of cervical region: Secondary | ICD-10-CM | POA: Diagnosis not present

## 2015-12-27 DIAGNOSIS — M9904 Segmental and somatic dysfunction of sacral region: Secondary | ICD-10-CM | POA: Diagnosis not present

## 2015-12-29 DIAGNOSIS — M9904 Segmental and somatic dysfunction of sacral region: Secondary | ICD-10-CM | POA: Diagnosis not present

## 2015-12-29 DIAGNOSIS — M9903 Segmental and somatic dysfunction of lumbar region: Secondary | ICD-10-CM | POA: Diagnosis not present

## 2015-12-29 DIAGNOSIS — M9901 Segmental and somatic dysfunction of cervical region: Secondary | ICD-10-CM | POA: Diagnosis not present

## 2015-12-29 DIAGNOSIS — M5441 Lumbago with sciatica, right side: Secondary | ICD-10-CM | POA: Diagnosis not present

## 2015-12-29 DIAGNOSIS — M9905 Segmental and somatic dysfunction of pelvic region: Secondary | ICD-10-CM | POA: Diagnosis not present

## 2016-01-06 DIAGNOSIS — M9903 Segmental and somatic dysfunction of lumbar region: Secondary | ICD-10-CM | POA: Diagnosis not present

## 2016-01-06 DIAGNOSIS — M5441 Lumbago with sciatica, right side: Secondary | ICD-10-CM | POA: Diagnosis not present

## 2016-01-06 DIAGNOSIS — M9905 Segmental and somatic dysfunction of pelvic region: Secondary | ICD-10-CM | POA: Diagnosis not present

## 2016-01-06 DIAGNOSIS — M9901 Segmental and somatic dysfunction of cervical region: Secondary | ICD-10-CM | POA: Diagnosis not present

## 2016-01-06 DIAGNOSIS — M9904 Segmental and somatic dysfunction of sacral region: Secondary | ICD-10-CM | POA: Diagnosis not present

## 2016-01-18 DIAGNOSIS — J069 Acute upper respiratory infection, unspecified: Secondary | ICD-10-CM | POA: Diagnosis not present

## 2016-01-18 DIAGNOSIS — R6889 Other general symptoms and signs: Secondary | ICD-10-CM | POA: Diagnosis not present

## 2016-02-05 ENCOUNTER — Emergency Department: Payer: Medicare Other

## 2016-02-05 ENCOUNTER — Emergency Department
Admission: EM | Admit: 2016-02-05 | Discharge: 2016-02-05 | Disposition: A | Discharge status: Home / Self Care | Payer: Medicare Other | Attending: Emergency Medicine | Admitting: Emergency Medicine

## 2016-02-05 ENCOUNTER — Encounter: Payer: Self-pay | Admitting: Emergency Medicine

## 2016-02-05 DIAGNOSIS — Z7984 Long term (current) use of oral hypoglycemic drugs: Secondary | ICD-10-CM | POA: Insufficient documentation

## 2016-02-05 DIAGNOSIS — I5030 Unspecified diastolic (congestive) heart failure: Secondary | ICD-10-CM | POA: Diagnosis not present

## 2016-02-05 DIAGNOSIS — Z7982 Long term (current) use of aspirin: Secondary | ICD-10-CM | POA: Insufficient documentation

## 2016-02-05 DIAGNOSIS — J40 Bronchitis, not specified as acute or chronic: Secondary | ICD-10-CM | POA: Diagnosis not present

## 2016-02-05 DIAGNOSIS — I110 Hypertensive heart disease with heart failure: Secondary | ICD-10-CM | POA: Insufficient documentation

## 2016-02-05 DIAGNOSIS — J209 Acute bronchitis, unspecified: Secondary | ICD-10-CM | POA: Diagnosis not present

## 2016-02-05 DIAGNOSIS — R05 Cough: Secondary | ICD-10-CM | POA: Diagnosis not present

## 2016-02-05 DIAGNOSIS — Z79899 Other long term (current) drug therapy: Secondary | ICD-10-CM | POA: Diagnosis not present

## 2016-02-05 DIAGNOSIS — E119 Type 2 diabetes mellitus without complications: Secondary | ICD-10-CM | POA: Diagnosis not present

## 2016-02-05 LAB — CBC
HEMATOCRIT: 36.5 % (ref 35.0–47.0)
HEMOGLOBIN: 11.9 g/dL — AB (ref 12.0–16.0)
MCH: 27.6 pg (ref 26.0–34.0)
MCHC: 32.7 g/dL (ref 32.0–36.0)
MCV: 84.4 fL (ref 80.0–100.0)
Platelets: 240 10*3/uL (ref 150–440)
RBC: 4.32 MIL/uL (ref 3.80–5.20)
RDW: 15.2 % — ABNORMAL HIGH (ref 11.5–14.5)
WBC: 8.8 10*3/uL (ref 3.6–11.0)

## 2016-02-05 LAB — BASIC METABOLIC PANEL
ANION GAP: 6 (ref 5–15)
BUN: 18 mg/dL (ref 6–20)
CALCIUM: 9.6 mg/dL (ref 8.9–10.3)
CO2: 25 mmol/L (ref 22–32)
Chloride: 107 mmol/L (ref 101–111)
Creatinine, Ser: 0.85 mg/dL (ref 0.44–1.00)
Glucose, Bld: 156 mg/dL — ABNORMAL HIGH (ref 65–99)
POTASSIUM: 3.7 mmol/L (ref 3.5–5.1)
Sodium: 138 mmol/L (ref 135–145)

## 2016-02-05 LAB — TROPONIN I

## 2016-02-05 MED ORDER — LEVOFLOXACIN 750 MG PO TABS
750.0000 mg | ORAL_TABLET | Freq: Once | ORAL | Status: AC
  Administered 2016-02-05: 750 mg via ORAL
  Filled 2016-02-05: qty 1

## 2016-02-05 MED ORDER — LEVOFLOXACIN 750 MG PO TABS: 750.0000 mg | ORAL_TABLET | Freq: Every day | ORAL | 0 refills | Status: AC

## 2016-02-05 NOTE — ED Provider Notes (Signed)
[facility] Emergency Department Provider Note  ____________________________________________  Time seen: Approximately 7:30 PM  I have reviewed the triage vital signs and the nursing notes.   HISTORY  Chief Complaint Shortness of Breath   HPI Gabriella Howe is a 81 y.o. female history of CHF, hypertension, hyperlipidemia who presents for evaluation of cough. Patient has been having a cough for almost a month. The cough is dry. She was seen by her primary care doctor on 01/18/16 with a negative influenza swab and he was placed on Augmentin. She reports that the antibiotics did not improve her symptoms. She continues to have cough and over the course of the last few days she's been having nausea and decreased by mouth intake. She reports that the cough can be so severe that causes her to feel She denies fever or chills, sore throat, headache, shortness of breath, chest pain, abdominal pain, vomiting, diarrhea, dysuria. She reports that the cough is present all day long and also at night. NO wheezing, no weight gain, no leg edema, no orthopnea although patient sleeps in a recliner for 16 years due to chronic back pain.  Past Medical History:  Diagnosis Date  . Adenomatous colon polyp 2003  . Benign neoplasm of colon   . Carpal tunnel syndrome   . Chest pain, unspecified   . CHF (congestive heart failure) (HCC)    diastolic  . Degeneration of intervertebral disc, site unspecified   . Diabetes mellitus    type II  . Encounter for long-term (current) use of other medications   . Fluid overload   . GERD (gastroesophageal reflux disease)   . Heart disease, unspecified   . Hyperlipidemia   . Hypertension   . Kidney stone 3/12   hosp/ with stent  . Obesity, unspecified   . Rosacea   . Shortness of breath   . Unspecified asthma(493.90)   . Unspecified sleep apnea     Patient Active Problem List   Diagnosis Date Noted  . Routine general medical examination  at a health care facility 04/14/2015  . Back pain 07/28/2014  . Knee pain 07/28/2014  . Encounter for Medicare annual wellness exam 11/19/2012  . History of kidney stones 11/19/2012  . Abnormal mammogram 12/21/2011  . Kidney stone 06/07/2011  . Other screening mammogram 08/10/2010  . DIASTOLIC HEART FAILURE, CHRONIC 07/29/2009  . DYSPNEA 07/23/2009  . HYPERCHOLESTEROLEMIA, PURE 09/13/2006  . FLUID RETENTION 09/10/2006  . Obesity 09/10/2006  . CARPAL TUNNEL SYNDROME 09/10/2006  . Essential hypertension 09/10/2006  . DIASTOLIC DYSFUNCTION [id number]  . REACTIVE AIRWAY DISEASE 09/10/2006  . GERD 09/10/2006  . ROSACEA 09/10/2006  . Degeneration of lumbar or lumbosacral intervertebral disc 09/10/2006  . SLEEP APNEA 09/10/2006  . COLONIC POLYPS, HX OF 09/10/2006  . Diabetes type 2, controlled ([place]) 09/06/2006    Past Surgical History:  Procedure Laterality Date  . ABDOMINAL HYSTERECTOMY     partial, fibroids  . ESOPHAGOGASTRODUODENOSCOPY  2000  . UMBILICAL HERNIA REPAIR  01/2004  . URETERAL STENT PLACEMENT  2/13   for L sided stone/ ecoli sepsis    Prior to Admission medications   Medication Sig Start Date End Date Taking? Authorizing Provider  Ascorbic Acid (VITAMIN C) 500 MG tablet Take 500 mg by mouth daily.      Historical Provider, MD  Aspirin-Caffeine (BC FAST PAIN RELIEF PO) Take by mouth as directed.      Historical Provider, MD  benazepril (LOTENSIN) 20 MG tablet Take 1 tablet  by mouth  daily 11/09/15   Abner Greenspan, MD  CALCIUM-VITAMIN D PO Take 1 tablet by mouth 2 (two) times daily.     Historical Provider, MD  furosemide (LASIX) 40 MG tablet Take one-half tablet by  mouth daily 11/09/15   Abner Greenspan, MD  glipiZIDE (GLUCOTROL XL) 5 MG 24 hr tablet Take 1 tablet by mouth  daily with breakfast 11/09/15   Abner Greenspan, MD  glucose blood (BAYER CONTOUR TEST) test strip Use to check blood sugar once daily as directed for DM 250.0 12/05/12   Abner Greenspan, MD  levofloxacin  (LEVAQUIN) 750 MG tablet Take 1 tablet (750 mg total) by mouth daily. 02/05/16 02/11/16  Rudene Re, MD  Magnesium 250 MG TABS Take by mouth daily.    Historical Provider, MD  metFORMIN (GLUCOPHAGE) 1000 MG tablet Take 1 tablet by mouth  twice a day with meals 11/09/15   Abner Greenspan, MD  MICROLET LANCETS MISC Check blood sugar once daily as directed for DM 250.0 12/05/12   Abner Greenspan, MD  Multiple Vitamin (MULTIVITAMIN) capsule Take 1 capsule by mouth daily.      Historical Provider, MD  olmesartan (BENICAR) 20 MG tablet Take 20 mg by mouth daily.     Historical Provider, MD  Omega-3 Fatty Acids (FISH OIL PO) Take 1 capsule by mouth daily.     Historical Provider, MD  omeprazole (PRILOSEC) 20 MG capsule Take 1 capsule by mouth  daily 11/09/15   Abner Greenspan, MD  Pseudoephedrine HCl (SINUS & ALLERGY 12 HOUR PO) Take by mouth as directed.      Historical Provider, MD  simvastatin (ZOCOR) 40 MG tablet Take 1 tablet by mouth at  bedtime 11/09/15   Abner Greenspan, MD  triamcinolone cream (KENALOG) 0.1 % Apply 1 application topically 2 (two) times daily. Apply to affected areas twice daily. 12/14/14   Pleas Koch, NP  vitamin E 400 UNIT capsule Take 400 Units by mouth daily.      Historical Provider, MD    Allergies Rofecoxib; Tetanus-diphtheria toxoids td; Antihistamines, chlorpheniramine-type; and Sulfa antibiotics  No family history on file.  Social History Social History  Substance Use Topics  . Smoking status: Never Smoker  . Smokeless tobacco: Not on file  . Alcohol use No    Review of Systems Constitutional: Negative for fever. Eyes: Negative for visual changes. ENT: Negative for sore throat. Cardiovascular: Negative for chest pain. Respiratory: Negative for shortness of breath. + cough  Gastrointestinal: Negative for abdominal pain, vomiting or diarrhea. Genitourinary: Negative for dysuria. Musculoskeletal: Negative for back pain. Skin: Negative for  rash. Neurological: Negative for headaches, weakness or numbness. ____________________________________________   PHYSICAL EXAM:  VITAL SIGNS: ED Triage Vitals [02/05/16 1450]  Enc Vitals Group     BP (!) 152/70     Pulse Rate 91     Resp 20     Temp 98.6 F (37 C)     Temp Source Oral     SpO2 97 %     Weight 209 lb (94.8 kg)     Height 5\' 1"  (1.549 m)     Head Circumference      Peak Flow      Pain Score      Pain Loc      Pain Edu?      Excl. in [place]?     Constitutional: Alert and oriented. Well appearing and in no apparent distress. HEENT:  Head: Normocephalic and atraumatic.         Eyes: Conjunctivae are normal. Sclera is non-icteric. EOMI. PERRL      Mouth/Throat: Mucous membranes are moist.       Neck: Supple with no signs of meningismus. Cardiovascular: Regular rate and rhythm. No murmurs, gallops, or rubs. 2+ symmetrical distal pulses are present in all extremities. No JVD. Respiratory: Normal respiratory effort. Lungs are clear to auscultation bilaterally. No wheezes, crackles, or rhonchi.  Gastrointestinal: Soft, non tender, and non distended with positive bowel sounds. No rebound or guarding. Musculoskeletal: Nontender with normal range of motion in all extremities. No edema, cyanosis, or erythema of extremities. Neurologic: Normal speech and language. Face is symmetric. Moving all extremities. No gross focal neurologic deficits are appreciated. Skin: Skin is warm, dry and intact. No rash noted. Psychiatric: Mood and affect are normal. Speech and behavior are normal.  ____________________________________________   LABS (all labs ordered are listed, but only abnormal results are displayed)  Labs Reviewed  BASIC METABOLIC PANEL - Abnormal; Notable for the following:       Result Value   Glucose, Bld 156 (*)    All other components within normal limits  CBC - Abnormal; Notable for the following:    Hemoglobin 11.9 (*)    RDW 15.2 (*)    All other  components within normal limits  TROPONIN I   ____________________________________________  EKG  ED ECG REPORT I, Rudene Re, the attending physician, personally viewed and interpreted this ECG.  Normal sinus rhythm, rate of 92, normal intervals, normal axis, no ST elevations or depressions. Unchanged from prior ____________________________________________  RADIOLOGY  CXR: Negative ____________________________________________   PROCEDURES  Procedure(s) performed: None Procedures Critical Care performed:  None ____________________________________________   INITIAL IMPRESSION / ASSESSMENT AND PLAN / ED COURSE   81 y.o. female history of CHF, hypertension, hyperlipidemia who presents for evaluation of cough x 1 month and now with a few days of nausea, decreased PO intake and fatigue. Patient was previously on Augmentin and had negative flu swab per PCP. No fever. She is well appearing, normal WOB, VS WNL, WBC normal, no fever, CXR with no infiltrate, EKG and troponin with no evidence of ischemia. Lungs CTAB with no wheezing or crackles. Will place patient on levaquin for possible atypical PNA and will dc home with close f/u with PCP.  Clinical Course     Pertinent labs & imaging results that were available during my care of the patient were reviewed by me and considered in my medical decision making (see chart for details).    ____________________________________________   FINAL CLINICAL IMPRESSION(S) / ED DIAGNOSES  Final diagnoses:  Bronchitis      NEW MEDICATIONS STARTED DURING THIS VISIT:  New Prescriptions   LEVOFLOXACIN (LEVAQUIN) 750 MG TABLET    Take 1 tablet (750 mg total) by mouth daily.     Note:  This document was prepared using Dragon voice recognition software and may include unintentional dictation errors.    Rudene Re, MD 02/05/16 [phone]

## 2016-02-05 NOTE — ED Triage Notes (Signed)
Patient to ER for c/o shortness of breath with cough. Cough has been present approx 1 month. Patient states she went to [facility] approx 2 weeks ago and was given antibiotic for URI. States she feels no different after antibiotics completed. Patient states "I just feel like I can't get my breath". States that it feels similar to previous PNA episode. Patient able to speak in complete sentences without difficulty.

## 2016-02-05 NOTE — ED Notes (Signed)
Unable to obtain E-signature. Written signature obtained.

## 2016-04-08 ENCOUNTER — Telehealth: Payer: Self-pay | Admitting: Family Medicine

## 2016-04-08 DIAGNOSIS — E119 Type 2 diabetes mellitus without complications: Secondary | ICD-10-CM

## 2016-04-08 DIAGNOSIS — Z0000 Encounter for general adult medical examination without abnormal findings: Secondary | ICD-10-CM

## 2016-04-08 NOTE — Telephone Encounter (Signed)
-----   Message from Eustace Pen, LPN sent at [id number]  5:29 PM EST ----- Regarding: Lab Orders 04/12/16 Please place lab orders, if any. Thank you.

## 2016-04-11 ENCOUNTER — Other Ambulatory Visit: Payer: Self-pay | Admitting: *Deleted

## 2016-04-11 MED ORDER — GLUCOSE BLOOD VI STRP
ORAL_STRIP | 0 refills | Status: DC
Start: 2016-04-11 — End: 2019-10-23

## 2016-04-11 MED ORDER — MICROLET LANCETS MISC: 0 refills | Status: DC

## 2016-04-12 ENCOUNTER — Ambulatory Visit: Payer: Medicare Other

## 2016-04-14 ENCOUNTER — Ambulatory Visit: Payer: Medicare Other

## 2016-04-17 ENCOUNTER — Encounter: Payer: Medicare Other | Admitting: Family Medicine

## 2016-04-20 ENCOUNTER — Ambulatory Visit (INDEPENDENT_AMBULATORY_CARE_PROVIDER_SITE_OTHER): Payer: Medicare Other

## 2016-04-20 VITALS — BP 120/78 | HR 70 | Temp 98.1°F | Ht 60.5 in | Wt 204.5 lb

## 2016-04-20 DIAGNOSIS — Z0000 Encounter for general adult medical examination without abnormal findings: Secondary | ICD-10-CM

## 2016-04-20 DIAGNOSIS — Z23 Encounter for immunization: Secondary | ICD-10-CM | POA: Diagnosis not present

## 2016-04-20 DIAGNOSIS — E119 Type 2 diabetes mellitus without complications: Secondary | ICD-10-CM

## 2016-04-20 LAB — CBC WITH DIFFERENTIAL/PLATELET
BASOS ABS: 0 10*3/uL (ref 0.0–0.1)
Basophils Relative: 0.5 % (ref 0.0–3.0)
Eosinophils Absolute: 0.2 10*3/uL (ref 0.0–0.7)
Eosinophils Relative: 2.4 % (ref 0.0–5.0)
HCT: 37.1 % (ref 36.0–46.0)
Hemoglobin: 12.3 g/dL (ref 12.0–15.0)
LYMPHS ABS: 2 10*3/uL (ref 0.7–4.0)
Lymphocytes Relative: 27.5 % (ref 12.0–46.0)
MCHC: 33.1 g/dL (ref 30.0–36.0)
MCV: 82.2 fl (ref 78.0–100.0)
MONO ABS: 0.4 10*3/uL (ref 0.1–1.0)
MONOS PCT: 5.3 % (ref 3.0–12.0)
NEUTROS ABS: 4.7 10*3/uL (ref 1.4–7.7)
NEUTROS PCT: 64.3 % (ref 43.0–77.0)
PLATELETS: 232 10*3/uL (ref 150.0–400.0)
RBC: 4.51 Mil/uL (ref 3.87–5.11)
RDW: 15 % (ref 11.5–15.5)
WBC: 7.2 10*3/uL (ref 4.0–10.5)

## 2016-04-20 LAB — COMPREHENSIVE METABOLIC PANEL
ALT: 17 U/L (ref 0–35)
AST: 19 U/L (ref 0–37)
Albumin: 4.3 g/dL (ref 3.5–5.2)
Alkaline Phosphatase: 57 U/L (ref 39–117)
BUN: 22 mg/dL (ref 6–23)
CALCIUM: 10 mg/dL (ref 8.4–10.5)
CHLORIDE: 106 meq/L (ref 96–112)
CO2: 26 meq/L (ref 19–32)
CREATININE: 0.8 mg/dL (ref 0.40–1.20)
GFR: 73.46 mL/min (ref >60.00)
Glucose, Bld: 145 mg/dL — ABNORMAL HIGH (ref 70–99)
Potassium: 4.2 mEq/L (ref 3.5–5.1)
Sodium: 141 mEq/L (ref 135–145)
Total Bilirubin: 0.8 mg/dL (ref 0.2–1.2)
Total Protein: 7 g/dL (ref 6.0–8.3)

## 2016-04-20 LAB — LIPID PANEL
CHOL/HDL RATIO: 2
Cholesterol: 118 mg/dL (ref 0–200)
HDL: 52 mg/dL (ref >39.00)
LDL CALC: 36 mg/dL (ref 0–99)
NONHDL: 65.92
TRIGLYCERIDES: 152 mg/dL — AB (ref 0.0–149.0)
VLDL: 30.4 mg/dL (ref 0.0–40.0)

## 2016-04-20 LAB — HEMOGLOBIN A1C: Hgb A1c MFr Bld: 6.7 % — ABNORMAL HIGH (ref 4.6–6.5)

## 2016-04-20 LAB — TSH: TSH: 1.27 u[IU]/mL (ref 0.35–4.50)

## 2016-04-20 NOTE — Progress Notes (Signed)
PCP notes:   Health maintenance:  A1C - completed Flu shot - administered  Abnormal screenings:   Hearing - failed Mini-Cog score: 19/20 Depression score: 1  Patient concerns:   None  Nurse concerns:  None  Next PCP appt:   04/26/16 @ 1115

## 2016-04-20 NOTE — Progress Notes (Signed)
Subjective:   Gabriella Howe is a 82 y.o. female who presents for Medicare Annual (Subsequent) preventive examination.  Review of Systems:  N/A Cardiac Risk Factors include: advanced age (>12men, >55 women);obesity (BMI >30kg/m2);diabetes mellitus;dyslipidemia;hypertension     Objective:     Vitals: BP 120/78 (BP Location: Right Arm, Patient Position: Sitting, Cuff Size: Normal)   Pulse 70   Temp 98.1 F (36.7 C) (Oral)   Ht 5' 0.5" (1.537 m) Comment: no shoes  Wt 204 lb 8 oz (92.8 kg)   SpO2 97%   BMI 39.28 kg/m   Body mass index is 39.28 kg/m.   Tobacco History  Smoking Status  . Never Smoker  Smokeless Tobacco  . Never Used     Counseling given: No   Past Medical History:  Diagnosis Date  . Adenomatous colon polyp 2003  . Benign neoplasm of colon   . Carpal tunnel syndrome   . Chest pain, unspecified   . CHF (congestive heart failure) (HCC)    diastolic  . Degeneration of intervertebral disc, site unspecified   . Diabetes mellitus    type II  . Encounter for long-term (current) use of other medications   . Fluid overload   . GERD (gastroesophageal reflux disease)   . Heart disease, unspecified   . Hyperlipidemia   . Hypertension   . Kidney stone 3/12   hosp/ with stent  . Obesity, unspecified   . Rosacea   . Shortness of breath   . Unspecified asthma(493.90)   . Unspecified sleep apnea    Past Surgical History:  Procedure Laterality Date  . ABDOMINAL HYSTERECTOMY     partial, fibroids  . ESOPHAGOGASTRODUODENOSCOPY  2000  . UMBILICAL HERNIA REPAIR  01/2004  . URETERAL STENT PLACEMENT  2/13   for L sided stone/ ecoli sepsis   History reviewed. No pertinent family history. History  Sexual Activity  . Sexual activity: No    Outpatient Encounter Prescriptions as of 04/20/2016  Medication Sig  . Ascorbic Acid (VITAMIN C) 500 MG tablet Take 500 mg by mouth daily.    . Aspirin-Caffeine (BC FAST PAIN RELIEF PO) Take by mouth as directed.    .  benazepril (LOTENSIN) 20 MG tablet Take 1 tablet by mouth  daily  . CALCIUM-VITAMIN D PO Take 1 tablet by mouth 2 (two) times daily.   . furosemide (LASIX) 40 MG tablet Take one-half tablet by  mouth daily  . glipiZIDE (GLUCOTROL XL) 5 MG 24 hr tablet Take 1 tablet by mouth  daily with breakfast  . glucose blood (BAYER CONTOUR TEST) test strip Use to check blood sugar once daily as directed for DM (Dx. E11.9)  . Magnesium 250 MG TABS Take by mouth daily.  . metFORMIN (GLUCOPHAGE) 1000 MG tablet Take 1 tablet by mouth  twice a day with meals  . MICROLET LANCETS MISC Use to check blood sugar once daily as directed for DM (Dx. E11.9)  . Multiple Vitamin (MULTIVITAMIN) capsule Take 1 capsule by mouth daily.    . Omega-3 Fatty Acids (FISH OIL PO) Take 1 capsule by mouth daily.   Marland Kitchen omeprazole (PRILOSEC) 20 MG capsule Take 1 capsule by mouth  daily  . Pseudoephedrine HCl (SINUS & ALLERGY 12 HOUR PO) Take by mouth as directed.    . simvastatin (ZOCOR) 40 MG tablet Take 1 tablet by mouth at  bedtime  . triamcinolone cream (KENALOG) 0.1 % Apply 1 application topically 2 (two) times daily. Apply to affected areas twice  daily.  . vitamin E 400 UNIT capsule Take 400 Units by mouth daily.    Marland Kitchen olmesartan (BENICAR) 20 MG tablet Take 20 mg by mouth daily.    No facility-administered encounter medications on file as of 04/20/2016.     Activities of Daily Living In your present state of health, do you have any difficulty performing the following activities: 04/20/2016  Hearing? N  Vision? N  Difficulty concentrating or making decisions? N  Walking or climbing stairs? Y  Dressing or bathing? N  Doing errands, shopping? N  Preparing Food and eating ? N  Using the Toilet? N  In the past six months, have you accidently leaked urine? N  Do you have problems with loss of bowel control? N  Managing your Medications? N  Managing your Finances? N  Housekeeping or managing your Housekeeping? N  Some recent data  might be hidden    Patient Care Team: Abner Greenspan, MD as PCP - General Ronnell Freshwater, MD as Referring Physician (Ophthalmology)    Assessment:     Hearing Screening   125Hz  250Hz  500Hz  1000Hz  2000Hz  3000Hz  4000Hz  6000Hz  8000Hz   Right ear:   0 40 40  40    Left ear:   0 40 40  40    Vision Screening Comments: Last vision exam in May 2017 with Dr. Joan Mayans   Exercise Activities and Dietary recommendations Current Exercise Habits: The patient does not participate in regular exercise at present, Exercise limited by: None identified  Goals    . Increase physical activity          Starting 04/24/2016, I plan to attend [employer] program at least 2 days per week.       Fall Risk Fall Risk  04/20/2016 04/14/2015 11/28/2013 11/19/2012  Falls in the past year? No No No No   Depression Screen PHQ 2/9 Scores 04/20/2016 04/14/2015 11/28/2013 11/19/2012  PHQ - 2 Score 1 0 0 0     Cognitive Function MMSE - Mini Mental State Exam 04/20/2016  Orientation to time 5  Orientation to Place 5  Registration 3  Attention/ Calculation 0  Recall 2  Language- name 2 objects 0  Language- repeat 1  Language- follow 3 step command 3  Language- read & follow direction 0  Write a sentence 0  Copy design 0  Total score 20     PLEASE NOTE: A Mini-Cog screen was completed. Maximum score is 20. A value of 0 denotes this part of Folstein MMSE was not completed or the patient failed this part of the Mini-Cog screening.   Mini-Cog Screening Orientation to Time - Max 5 pts Orientation to Place - Max 5 pts Registration - Max 3 pts Recall - Max 3 pts Language Repeat - Max 1 pts Language Follow 3 Step Command - Max 3 pts     Immunization History  Administered Date(s) Administered  . Influenza Split 01/24/2011  . Influenza Whole 05/10/2009  . Influenza,inj,Quad PF,36+ Mos 11/28/2013, 04/14/2015, 04/20/2016  . Pneumococcal Conjugate-13 11/28/2013  . Pneumococcal Polysaccharide-23  03/28/2003   Screening Tests Health Maintenance  Topic Date Due  . OPHTHALMOLOGY EXAM  07/29/2016  . FOOT EXAM  10/14/2016  . HEMOGLOBIN A1C  10/18/2016  . COLONOSCOPY  07/28/2017  . TETANUS/TDAP  05/16/2021  . INFLUENZA VACCINE  Addressed  . DEXA SCAN  Completed  . ZOSTAVAX  Addressed  . PNA vac Low Risk Adult  Completed      Plan:  I have personally reviewed and addressed the Medicare Annual Wellness questionnaire and have noted the following in the patient's chart:  A. Medical and social history B. Use of alcohol, tobacco or illicit drugs  C. Current medications and supplements D. Functional ability and status E.  Nutritional status F.  Physical activity G. Advance directives H. List of other physicians I.  Hospitalizations, surgeries, and ER visits in previous 12 months J.  [place] to include hearing, vision, cognitive, depression L. Referrals and appointments - none  In addition, I have reviewed and discussed with patient certain preventive protocols, quality metrics, and best practice recommendations. A written personalized care plan for preventive services as well as general preventive health recommendations were provided to patient.  See attached scanned questionnaire for additional information.   Signed,   Lindell Noe, MHA, BS, LPN Health Coach

## 2016-04-20 NOTE — Patient Instructions (Signed)
Gabriella Howe , Thank you for taking time to come for your Medicare Wellness Visit. I appreciate your ongoing commitment to your health goals. Please review the following plan we discussed and let me know if I can assist you in the future.   These are the goals we discussed: Goals    . Increase physical activity          Starting 04/24/2016, I plan to attend [employer] program at least 2 days per week.        This is a list of the screening recommended for you and due dates:  Health Maintenance  Topic Date Due  . Eye exam for diabetics  07/29/2016  . Complete foot exam   10/14/2016  . Hemoglobin A1C  10/18/2016  . Colon Cancer Screening  07/28/2017  . Tetanus Vaccine  05/16/2021  . Flu Shot  Addressed  . DEXA scan (bone density measurement)  Completed  . Shingles Vaccine  Addressed  . Pneumonia vaccines  Completed   Preventive Care for Adults  A healthy lifestyle and preventive care can promote health and wellness. Preventive health guidelines for adults include the following key practices.  . A routine yearly physical is a good way to check with your health care provider about your health and preventive screening. It is a chance to share any concerns and updates on your health and to receive a thorough exam.  . Visit your dentist for a routine exam and preventive care every 6 months. Brush your teeth twice a day and floss once a day. Good oral hygiene prevents tooth decay and gum disease.  . The frequency of eye exams is based on your age, health, family medical history, use  of contact lenses, and other factors. Follow your health care provider's ecommendations for frequency of eye exams.  . Eat a healthy diet. Foods like vegetables, fruits, whole grains, low-fat dairy products, and lean protein foods contain the nutrients you need without too many calories. Decrease your intake of foods high in solid fats, added sugars, and salt. Eat the right amount of calories for you. Get  information about a proper diet from your health care provider, if necessary.  . Regular physical exercise is one of the most important things you can do for your health. Most adults should get at least 150 minutes of moderate-intensity exercise (any activity that increases your heart rate and causes you to sweat) each week. In addition, most adults need muscle-strengthening exercises on 2 or more days a week.  Silver [employer]neakers may be a benefit available to you. To determine eligibility, you may visit the website: www.silversneakers.com or contact program at [phone] Mon-Fri between 8AM-8PM.   . Maintain a healthy weight. The body mass index (BMI) is a screening tool to identify possible weight problems. It provides an estimate of body fat based on height and weight. Your health care provider can find your BMI and can help you achieve or maintain a healthy weight.   For adults 20 years and older: ? A BMI below 18.5 is considered underweight. ? A BMI of 18.5 to 24.9 is normal. ? A BMI of 25 to 29.9 is considered overweight. ? A BMI of 30 and above is considered obese.   . Maintain normal blood lipids and cholesterol levels by exercising and minimizing your intake of saturated fat. Eat a balanced diet with plenty of fruit and vegetables. Blood tests for lipids and cholesterol should begin at age 79 and be repeated every 5  years. If your lipid or cholesterol levels are high, you are over 50, or you are at high risk for heart disease, you may need your cholesterol levels checked more frequently. Ongoing high lipid and cholesterol levels should be treated with medicines if diet and exercise are not working.  . If you smoke, find out from your health care provider how to quit. If you do not use tobacco, please do not start.  . If you choose to drink alcohol, please do not consume more than 2 drinks per day. One drink is considered to be 12 ounces (355 mL) of beer, 5 ounces (148 mL) of wine, or 1.5  ounces (44 mL) of liquor.  . If you are 79-68 years old, ask your health care provider if you should take aspirin to prevent strokes.  . Use sunscreen. Apply sunscreen liberally and repeatedly throughout the day. You should seek shade when your shadow is shorter than you. Protect yourself by wearing long sleeves, pants, a wide-brimmed hat, and sunglasses year round, whenever you are outdoors.  . Once a month, do a whole body skin exam, using a mirror to look at the skin on your back. Tell your health care provider of new moles, moles that have irregular borders, moles that are larger than a pencil eraser, or moles that have changed in shape or color.

## 2016-04-20 NOTE — Progress Notes (Signed)
Medical screening examination/treatment/procedure(s) were performed by registered nurse and as supervising non-physician practitioner, I was immediately available for consultation/collaboration.   Nayelli Inglis, NP  

## 2016-04-20 NOTE — Progress Notes (Signed)
Pre visit review using our clinic review tool, if applicable. No additional management support is needed unless otherwise documented below in the visit note. 

## 2016-04-26 ENCOUNTER — Ambulatory Visit (INDEPENDENT_AMBULATORY_CARE_PROVIDER_SITE_OTHER): Payer: Medicare Other | Admitting: Family Medicine

## 2016-04-26 ENCOUNTER — Encounter: Payer: Self-pay | Admitting: Family Medicine

## 2016-04-26 VITALS — BP 122/78 | HR 67 | Temp 97.7°F | Ht 60.5 in | Wt 208.2 lb

## 2016-04-26 DIAGNOSIS — I10 Essential (primary) hypertension: Secondary | ICD-10-CM

## 2016-04-26 DIAGNOSIS — Z0000 Encounter for general adult medical examination without abnormal findings: Secondary | ICD-10-CM | POA: Diagnosis not present

## 2016-04-26 DIAGNOSIS — I5032 Chronic diastolic (congestive) heart failure: Secondary | ICD-10-CM

## 2016-04-26 DIAGNOSIS — E7800 Pure hypercholesterolemia, unspecified: Secondary | ICD-10-CM

## 2016-04-26 DIAGNOSIS — E119 Type 2 diabetes mellitus without complications: Secondary | ICD-10-CM

## 2016-04-26 MED ORDER — GLIPIZIDE ER 5 MG PO TB24: 5.0000 mg | ORAL_TABLET | Freq: Every day | ORAL | 3 refills | Status: DC

## 2016-04-26 MED ORDER — SIMVASTATIN 40 MG PO TABS: 40.0000 mg | ORAL_TABLET | Freq: Every day | ORAL | 3 refills | Status: DC

## 2016-04-26 MED ORDER — METFORMIN HCL 1000 MG PO TABS: 1000.0000 mg | ORAL_TABLET | Freq: Two times a day (BID) | ORAL | 3 refills | Status: DC

## 2016-04-26 MED ORDER — FUROSEMIDE 40 MG PO TABS: 20.0000 mg | ORAL_TABLET | Freq: Every day | ORAL | 3 refills | Status: DC

## 2016-04-26 MED ORDER — OMEPRAZOLE 20 MG PO CPDR: 20.0000 mg | DELAYED_RELEASE_CAPSULE | Freq: Every day | ORAL | 3 refills | Status: DC

## 2016-04-26 MED ORDER — BENAZEPRIL HCL 20 MG PO TABS: 20.0000 mg | ORAL_TABLET | Freq: Every day | ORAL | 3 refills | Status: DC

## 2016-04-26 NOTE — Assessment & Plan Note (Signed)
Reviewed health habits including diet and exercise and skin cancer prevention Reviewed appropriate screening tests for age  Also reviewed health mt list, fam hx and immunization status , as well as social and family history   See HPI Labs rev  AMW reviewed Does not want further hearing eval  colonosc has 3 y recall Declines mammograms  utd imms  Enc her to stay mentally and physically active-she continues to work

## 2016-04-26 NOTE — Assessment & Plan Note (Signed)
Well controlled with simvastatin and diet  Disc goals for lipids and reasons to control them Rev labs with pt Rev low sat fat diet in detail  

## 2016-04-26 NOTE — Assessment & Plan Note (Signed)
bp in fair control at this time  BP Readings from Last 1 Encounters:  04/26/16 122/78   No changes needed Disc lifstyle change with low sodium diet and exercise  Labs rev

## 2016-04-26 NOTE — Assessment & Plan Note (Signed)
No c/o of sob or other symptoms

## 2016-04-26 NOTE — Progress Notes (Signed)
Pre visit review using our clinic review tool, if applicable. No additional management support is needed unless otherwise documented below in the visit note. 

## 2016-04-26 NOTE — Assessment & Plan Note (Signed)
Lab Results  Component Value Date   HGBA1C 6.7 (H) 04/20/2016   Eye and foot care utd  Cholesterol controlled  Wt loss enc

## 2016-04-26 NOTE — Progress Notes (Signed)
Subjective:    Patient ID: Gabriella Howe, female    DOB: 11/12/1934, 82 y.o.   MRN: [id number]  HPI Here for health maintenance exam and to review chronic medical problems    Doing ok  Working a lot  Has not liked the cold this year   Had AMW 1/25  Failed hearing screen (at 500 Hz)- her hearing has not bothered her at all - and no one has brought it up either  Mini cog- 20/20 Depression score 1  Wt Readings from Last 3 Encounters:  04/26/16 208 lb 4 oz (94.5 kg)  04/20/16 204 lb 8 oz (92.8 kg)  02/05/16 209 lb (94.8 kg)  she does not feel like her weight has changed - it goes up and down a few lb  Eating - mostly healthy /occ junk  Exercise - she plans on starting - will begin [facility] [facility]  bmi 40  chiropactor has been helpful for her back   Mammogram 9/13- she does not want to keep getting them  Self breast exam no lumps or changes   Pap/gyn care  Has had a hysterectomy No vaginal or pelvic symptoms   Zoster vaccine utd Flu vaccine utd Tetanus shot - utd Pneumonia vaccines utd 5/26  dexa 1/01-nl per pt  She declines more of those  No falls  No fractures  Takes ca with D and magnesium   Eye exam 5/17  Colonoscopy 5/16 2 adenomas - per pt 3 y recall  She has had colonoscopies for years and always has polyps  Brother was diag with colon cancer    bp is stable today  No cp or palpitations or headaches or edema  No side effects to medicines  BP Readings from Last 3 Encounters:  04/26/16 122/78  04/20/16 120/78  02/05/16 (!) 155/64      DM2 Lab Results  Component Value Date   HGBA1C 6.7 (H) 04/20/2016   was 6.8 Stable  Her monitor is not working- needs a new one   Hx of hyperlipidemia Lab Results  Component Value Date   CHOL 118 04/20/2016   CHOL 118 10/12/2015   CHOL 129 03/31/2015   Lab Results  Component Value Date   HDL 52.00 04/20/2016   HDL 52.90 10/12/2015   HDL 52.90 03/31/2015   Lab  Results  Component Value Date   LDLCALC 36 04/20/2016   LDLCALC 44 10/12/2015   LDLCALC 47 03/31/2015   Lab Results  Component Value Date   TRIG 152.0 (H) 04/20/2016   TRIG 104.0 10/12/2015   TRIG 148.0 03/31/2015   Lab Results  Component Value Date   CHOLHDL 2 04/20/2016   CHOLHDL 2 10/12/2015   CHOLHDL 2 03/31/2015   No results found for: LDLDIRECT Trig are up a bit  Overall really well controlled  zocor and diet  No red meat  Low sat/trans fat diet  Does eat tuna and chicken   Lab Results  Component Value Date   WBC 7.2 04/20/2016   HGB 12.3 04/20/2016   HCT 37.1 04/20/2016   MCV 82.2 04/20/2016   PLT 232.0 04/20/2016      Chemistry      Component Value Date/Time   NA 141 04/20/2016 1323   NA 141 11/15/2012 0853   K 4.2 04/20/2016 1323   K 4.4 07/15/2013 1549   CL 106 04/20/2016 1323   CL 109 (H) 11/15/2012 0853   CO2 26 04/20/2016  1323   CO2 24 11/15/2012 0853   BUN 22 04/20/2016 1323   BUN 13 11/15/2012 0853   CREATININE 0.80 04/20/2016 1323   CREATININE 0.92 11/15/2012 0853      Component Value Date/Time   CALCIUM 10.0 04/20/2016 1323   CALCIUM 9.5 11/15/2012 0853   ALKPHOS 57 04/20/2016 1323   ALKPHOS 94 11/15/2012 0853   AST 19 04/20/2016 1323   AST 17 11/15/2012 0853   ALT 17 04/20/2016 1323   ALT 19 11/15/2012 0853   BILITOT 0.8 04/20/2016 1323   BILITOT 0.5 11/15/2012 0853     glucose 145   Lab Results  Component Value Date   TSH 1.27 04/20/2016    Patient Active Problem List   Diagnosis Date Noted  . Routine general medical examination at a health care facility 04/14/2015  . Back pain 07/28/2014  . Knee pain 07/28/2014  . Encounter for Medicare annual wellness exam 11/19/2012  . History of kidney stones 11/19/2012  . Kidney stone 06/07/2011  . DIASTOLIC HEART FAILURE, CHRONIC 07/29/2009  . DYSPNEA 07/23/2009  . HYPERCHOLESTEROLEMIA, PURE 09/13/2006  . FLUID RETENTION 09/10/2006  . Morbid obesity ([place]) 09/10/2006  . CARPAL  TUNNEL SYNDROME 09/10/2006  . Essential hypertension 09/10/2006  . DIASTOLIC DYSFUNCTION [id number]  . REACTIVE AIRWAY DISEASE 09/10/2006  . GERD 09/10/2006  . ROSACEA 09/10/2006  . Degeneration of lumbar or lumbosacral intervertebral disc 09/10/2006  . SLEEP APNEA 09/10/2006  . COLONIC POLYPS, HX OF 09/10/2006  . Diabetes type 2, controlled ([place]) 09/06/2006   Past Medical History:  Diagnosis Date  . Adenomatous colon polyp 2003  . Benign neoplasm of colon   . Carpal tunnel syndrome   . Chest pain, unspecified   . CHF (congestive heart failure) (HCC)    diastolic  . Degeneration of intervertebral disc, site unspecified   . Diabetes mellitus    type II  . Encounter for long-term (current) use of other medications   . Fluid overload   . GERD (gastroesophageal reflux disease)   . Heart disease, unspecified   . Hyperlipidemia   . Hypertension   . Kidney stone 3/12   hosp/ with stent  . Obesity, unspecified   . Rosacea   . Shortness of breath   . Unspecified asthma(493.90)   . Unspecified sleep apnea    Past Surgical History:  Procedure Laterality Date  . ABDOMINAL HYSTERECTOMY     partial, fibroids  . ESOPHAGOGASTRODUODENOSCOPY  2000  . UMBILICAL HERNIA REPAIR  01/2004  . URETERAL STENT PLACEMENT  2/13   for L sided stone/ ecoli sepsis   Social History  Substance Use Topics  . Smoking status: Never Smoker  . Smokeless tobacco: Never Used  . Alcohol use No   No family history on file. Allergies  Allergen Reactions  . Rofecoxib     REACTION: reaction not known  . Tetanus-Diphtheria Toxoids Td     REACTION: sick with fever  . Antihistamines, Chlorpheniramine-Type Palpitations  . Sulfa Antibiotics Rash   Current Outpatient Prescriptions on File Prior to Visit  Medication Sig Dispense Refill  . Ascorbic Acid (VITAMIN C) 500 MG tablet Take 500 mg by mouth daily.      . Aspirin-Caffeine (BC FAST PAIN RELIEF PO) Take by mouth as directed.      Marland Kitchen CALCIUM-VITAMIN D  PO Take 1 tablet by mouth 2 (two) times daily.     Marland Kitchen glucose blood (BAYER CONTOUR TEST) test strip Use to check blood sugar once daily as directed for  DM (Dx. E11.9) 100 each 0  . Magnesium 250 MG TABS Take by mouth daily.    Marland Kitchen MICROLET LANCETS MISC Use to check blood sugar once daily as directed for DM (Dx. E11.9) 100 each 0  . Multiple Vitamin (MULTIVITAMIN) capsule Take 1 capsule by mouth daily.      . Omega-3 Fatty Acids (FISH OIL PO) Take 1 capsule by mouth daily.     . Pseudoephedrine HCl (SINUS & ALLERGY 12 HOUR PO) Take by mouth as directed.      . triamcinolone cream (KENALOG) 0.1 % Apply 1 application topically 2 (two) times daily. Apply to affected areas twice daily. 30 g 0  . vitamin E 400 UNIT capsule Take 400 Units by mouth daily.       No current facility-administered medications on file prior to visit.      Review of Systems Review of Systems  Constitutional: Negative for fever, appetite change, and unexpected weight change. pos for occ fatigue  Eyes: Negative for pain and visual disturbance.  Respiratory: Negative for cough and shortness of breath.   Cardiovascular: Negative for cp or palpitations    Gastrointestinal: Negative for nausea, diarrhea and constipation.  Genitourinary: Negative for urgency and frequency.  Skin: Negative for pallor or rash   Neurological: Negative for weakness, light-headedness, numbness and headaches.  Hematological: Negative for adenopathy. Does not bruise/bleed easily.  Psychiatric/Behavioral: Negative for dysphoric mood. The patient is not nervous/anxious.         Objective:   Physical Exam  Constitutional: She appears well-developed and well-nourished. No distress.  Morbidly obese and well appearing   HENT:  Head: Normocephalic and atraumatic.  Right Ear: External ear normal.  Left Ear: External ear normal.  Mouth/Throat: Oropharynx is clear and moist.  Eyes: Conjunctivae and EOM are normal. Pupils are equal, round, and reactive  to light. No scleral icterus.  Neck: Normal range of motion. Neck supple. No JVD present. Carotid bruit is not present. No thyromegaly present.  Cardiovascular: Normal rate, regular rhythm, normal heart sounds and intact distal pulses.  Exam reveals no gallop.   Pulmonary/Chest: Effort normal and breath sounds normal. No respiratory distress. She has no wheezes. She exhibits no tenderness.  Abdominal: Soft. Bowel sounds are normal. She exhibits no distension, no abdominal bruit and no mass. There is no tenderness.  Genitourinary: No breast swelling, tenderness, discharge or bleeding.  Genitourinary Comments: Breast exam: No mass, nodules, thickening, tenderness, bulging, retraction, inflamation, nipple discharge or skin changes noted.  No axillary or clavicular LA.      Musculoskeletal: Normal range of motion. She exhibits no edema or tenderness.  Lymphadenopathy:    She has no cervical adenopathy.  Neurological: She is alert. She has normal reflexes. No cranial nerve deficit. She exhibits normal muscle tone. Coordination normal.  Skin: Skin is warm and dry. No rash noted. No erythema. No pallor.  Lentigines and some sk   Psychiatric: She has a normal mood and affect.          Assessment & Plan:   Problem List Items Addressed This Visit      Cardiovascular and Mediastinum   Essential hypertension - Primary    bp in fair control at this time  BP Readings from Last 1 Encounters:  04/26/16 122/78   No changes needed Disc lifstyle change with low sodium diet and exercise  Labs rev      Relevant Medications   simvastatin (ZOCOR) 40 MG tablet   furosemide (LASIX) 40 MG  tablet   benazepril (LOTENSIN) 20 MG tablet     Endocrine   Diabetes type 2, controlled ([place])    Lab Results  Component Value Date   HGBA1C 6.7 (H) 04/20/2016   Eye and foot care utd  Cholesterol controlled  Wt loss enc      Relevant Medications   glipiZIDE (GLUCOTROL XL) 5 MG 24 hr tablet   simvastatin  (ZOCOR) 40 MG tablet   metFORMIN (GLUCOPHAGE) 1000 MG tablet   benazepril (LOTENSIN) 20 MG tablet     Other   HYPERCHOLESTEROLEMIA, PURE    Well controlled with simvastatin and diet Disc goals for lipids and reasons to control them Rev labs with pt Rev low sat fat diet in detail       Relevant Medications   simvastatin (ZOCOR) 40 MG tablet   furosemide (LASIX) 40 MG tablet   benazepril (LOTENSIN) 20 MG tablet   Morbid obesity (HCC)   Relevant Medications   glipiZIDE (GLUCOTROL XL) 5 MG 24 hr tablet   metFORMIN (GLUCOPHAGE) 1000 MG tablet   Routine general medical examination at a health care facility    Reviewed health habits including diet and exercise and skin cancer prevention Reviewed appropriate screening tests for age  Also reviewed health mt list, fam hx and immunization status , as well as social and family history   See HPI Labs rev  AMW reviewed Does not want further hearing eval  colonosc has 3 y recall Declines mammograms  utd imms  Enc her to stay mentally and physically active-she continues to work

## 2016-04-26 NOTE — Patient Instructions (Addendum)
Due for colonoscopy 5/19 Due for eye exam 5/18   Have the pharmacy call us with the name of the monitor you need   Follow up in 6 months with labs prior   Keep being as active as you can

## 2016-07-19 DIAGNOSIS — E119 Type 2 diabetes mellitus without complications: Secondary | ICD-10-CM | POA: Diagnosis not present

## 2016-07-19 LAB — HM DIABETES EYE EXAM

## 2016-07-21 ENCOUNTER — Encounter: Payer: Self-pay | Admitting: Family Medicine

## 2016-09-27 DIAGNOSIS — S91102A Unspecified open wound of left great toe without damage to nail, initial encounter: Secondary | ICD-10-CM | POA: Diagnosis not present

## 2016-09-29 ENCOUNTER — Telehealth: Payer: Self-pay

## 2016-09-29 MED ORDER — TRAMADOL HCL 50 MG PO TABS: 50.0000 mg | ORAL_TABLET | Freq: Three times a day (TID) | ORAL | 0 refills | Status: DC | PRN

## 2016-09-29 NOTE — Telephone Encounter (Signed)
Pt left v/m; pt was seen at walk in on 09/27/16 for shingles. Pt was given Acyclovir 800 mg; pt is taking Tylenol for pain but not effective. Pt request med for pain due to shingles. Last annual 04/26/16 when pt last seen. [place]. Pt request cb.

## 2016-09-29 NOTE — Telephone Encounter (Signed)
I'm sorry to hear that, where is the rash  Keep taking acyclovir as directed  Please cal in tramadol- caution of sedation! And also constipation (take stool softener if needed) F/u with me next week if poss to re check rash  Hope she feels better soon

## 2016-09-29 NOTE — Telephone Encounter (Signed)
Rx called in as prescribed   Spoke with pt and rash is in her hair, on her left ear and on her neck which is causing sever facial pain. Pt advise of Dr. Marliss Coots comments and instructions and f/u appt scheduled with Dr. Glori Bickers on 10/03/16

## 2016-09-29 NOTE — Telephone Encounter (Signed)
I will see her then  

## 2016-10-03 ENCOUNTER — Ambulatory Visit (INDEPENDENT_AMBULATORY_CARE_PROVIDER_SITE_OTHER): Payer: Medicare Other | Admitting: Family Medicine

## 2016-10-03 ENCOUNTER — Encounter: Payer: Self-pay | Admitting: Family Medicine

## 2016-10-03 VITALS — BP 126/82 | HR 87 | Temp 97.8°F | Ht 60.5 in | Wt 207.5 lb

## 2016-10-03 DIAGNOSIS — B029 Zoster without complications: Secondary | ICD-10-CM

## 2016-10-03 DIAGNOSIS — Z8619 Personal history of other infectious and parasitic diseases: Secondary | ICD-10-CM | POA: Insufficient documentation

## 2016-10-03 MED ORDER — TRAMADOL HCL 50 MG PO TABS: 50.0000 mg | ORAL_TABLET | Freq: Three times a day (TID) | ORAL | 0 refills | Status: DC | PRN

## 2016-10-03 NOTE — Progress Notes (Signed)
Subjective:    Patient ID: Gabriella Howe, female    DOB: 1934-12-22, 82 y.o.   MRN: [id number]  HPI Here for shingles   She was diagnosed at a walk in clinic 09/27/16  (rash on neck 1-2 days before that)  Rash in L scalp and L ear and neck  C/o facial pain /ear pain / headache  Was tx with acyclovir 800 mg - will finish it in the next 2 days  We called in tramadol for pain - helps pain some   Not making her constipated   No fever or other symptoms   Rash is starting to dry up  Pain is still bad    Per pt -this is her 3rd bout of shingles   Patient Active Problem List   Diagnosis Date Noted  . Shingles 10/03/2016  . Routine general medical examination at a health care facility 04/14/2015  . Back pain 07/28/2014  . Knee pain 07/28/2014  . Encounter for Medicare annual wellness exam 11/19/2012  . History of kidney stones 11/19/2012  . Kidney stone 06/07/2011  . DIASTOLIC HEART FAILURE, CHRONIC 07/29/2009  . DYSPNEA 07/23/2009  . HYPERCHOLESTEROLEMIA, PURE 09/13/2006  . FLUID RETENTION 09/10/2006  . Morbid obesity ([place]) 09/10/2006  . CARPAL TUNNEL SYNDROME 09/10/2006  . Essential hypertension 09/10/2006  . DIASTOLIC DYSFUNCTION 03/29/7251  . REACTIVE AIRWAY DISEASE 09/10/2006  . GERD 09/10/2006  . ROSACEA 09/10/2006  . Degeneration of lumbar or lumbosacral intervertebral disc 09/10/2006  . SLEEP APNEA 09/10/2006  . COLONIC POLYPS, HX OF 09/10/2006  . Diabetes type 2, controlled ([place]) 09/06/2006   Past Medical History:  Diagnosis Date  . Adenomatous colon polyp 2003  . Benign neoplasm of colon   . Carpal tunnel syndrome   . Chest pain, unspecified   . CHF (congestive heart failure) (HCC)    diastolic  . Degeneration of intervertebral disc, site unspecified   . Diabetes mellitus    type II  . Encounter for long-term (current) use of other medications   . Fluid overload   . GERD (gastroesophageal reflux disease)   . Heart disease, unspecified   .  Hyperlipidemia   . Hypertension   . Kidney stone 3/12   hosp/ with stent  . Obesity, unspecified   . Rosacea   . Shortness of breath   . Unspecified asthma(493.90)   . Unspecified sleep apnea    Past Surgical History:  Procedure Laterality Date  . ABDOMINAL HYSTERECTOMY     partial, fibroids  . ESOPHAGOGASTRODUODENOSCOPY  2000  . UMBILICAL HERNIA REPAIR  01/2004  . URETERAL STENT PLACEMENT  2/13   for L sided stone/ ecoli sepsis   Social History  Substance Use Topics  . Smoking status: Never Smoker  . Smokeless tobacco: Never Used  . Alcohol use No   No family history on file. Allergies  Allergen Reactions  . Rofecoxib     REACTION: reaction not known  . Tetanus-Diphtheria Toxoids Td     REACTION: sick with fever  . Antihistamines, Chlorpheniramine-Type Palpitations  . Sulfa Antibiotics Rash   Current Outpatient Prescriptions on File Prior to Visit  Medication Sig Dispense Refill  . Ascorbic Acid (VITAMIN C) 500 MG tablet Take 500 mg by mouth daily.      . Aspirin-Caffeine (BC FAST PAIN RELIEF PO) Take by mouth as directed.      . benazepril (LOTENSIN) 20 MG tablet Take 1 tablet (20 mg total) by mouth daily. 90 tablet 3  . CALCIUM-VITAMIN D  PO Take 1 tablet by mouth 2 (two) times daily.     . furosemide (LASIX) 40 MG tablet Take 0.5 tablets (20 mg total) by mouth daily. 45 tablet 3  . glipiZIDE (GLUCOTROL XL) 5 MG 24 hr tablet Take 1 tablet (5 mg total) by mouth daily with breakfast. 90 tablet 3  . glucose blood (BAYER CONTOUR TEST) test strip Use to check blood sugar once daily as directed for DM (Dx. E11.9) 100 each 0  . Magnesium 250 MG TABS Take by mouth daily.    . metFORMIN (GLUCOPHAGE) 1000 MG tablet Take 1 tablet (1,000 mg total) by mouth 2 (two) times daily with a meal. 180 tablet 3  . MICROLET LANCETS MISC Use to check blood sugar once daily as directed for DM (Dx. E11.9) 100 each 0  . Multiple Vitamin (MULTIVITAMIN) capsule Take 1 capsule by mouth daily.        . Omega-3 Fatty Acids (FISH OIL PO) Take 1 capsule by mouth daily.     Marland Kitchen omeprazole (PRILOSEC) 20 MG capsule Take 1 capsule (20 mg total) by mouth daily. 90 capsule 3  . Pseudoephedrine HCl (SINUS & ALLERGY 12 HOUR PO) Take by mouth as directed.      . simvastatin (ZOCOR) 40 MG tablet Take 1 tablet (40 mg total) by mouth at bedtime. 90 tablet 3  . triamcinolone cream (KENALOG) 0.1 % Apply 1 application topically 2 (two) times daily. Apply to affected areas twice daily. 30 g 0  . vitamin E 400 UNIT capsule Take 400 Units by mouth daily.       No current facility-administered medications on file prior to visit.     Review of Systems Review of Systems  Constitutional: Negative for fever, appetite change, fatigue and unexpected weight change.  Eyes: Negative for pain and visual disturbance.  Respiratory: Negative for cough and shortness of breath.   Cardiovascular: Negative for cp or palpitations    Gastrointestinal: Negative for nausea, diarrhea and constipation.  Genitourinary: Negative for urgency and frequency.  Skin: Negative for pallor ,pos for rash that is painful Neurological: Negative for weakness, light-headedness, numbness and headaches.  Hematological: Negative for adenopathy. Does not bruise/bleed easily.  Psychiatric/Behavioral: Negative for dysphoric mood. The patient is not nervous/anxious.         Objective:   Physical Exam  Constitutional: She appears well-developed and well-nourished. No distress.  obese and well appearing   HENT:  Head: Normocephalic and atraumatic.  Right Ear: External ear normal.  Left Ear: External ear normal.  Nose: Nose normal.  Mouth/Throat: Oropharynx is clear and moist.  Few vesicles on L ear (ext)   Eyes: Conjunctivae and EOM are normal. Pupils are equal, round, and reactive to light. Right eye exhibits no discharge. Left eye exhibits no discharge. No scleral icterus.  Neck: Normal range of motion. Neck supple.  Cardiovascular: Normal  rate, regular rhythm and normal heart sounds.   Pulmonary/Chest: Effort normal and breath sounds normal. No respiratory distress. She has no wheezes. She has no rales.  Musculoskeletal: She exhibits no edema or tenderness.  Lymphadenopathy:    She has no cervical adenopathy.  Neurological: She is alert. No cranial nerve deficit.  Skin: Skin is warm and dry. Rash noted. No erythema. No pallor.  Patches of vesicles- drying up  Scattered in L C2-3 dermatome in lower scalp and neck and outer ear  No streaking or erythema  Psychiatric: She has a normal mood and affect.  Assessment & Plan:   Problem List Items Addressed This Visit      Other   Shingles    L C2-3 distribution with pain (ear/headache/neck) Finishing acyclovir Rash improved Disc pain control with tramadol with caution of sedation Refilled 30 tramadol/ printed for future need Consider gabapentin if not imp  She will get Shingrix when it is available again      Relevant Medications   acyclovir (ZOVIRAX) 800 MG tablet

## 2016-10-03 NOTE — Patient Instructions (Addendum)
Finish the acyclovir  Give yourself permission to rest  Take the tramodol as needed for pain  Keep [place] posted   Get the Shingrix vaccine as soon as the pharmacy has it available again    Shingles Shingles, which is also known as herpes zoster, is an infection that causes a painful skin rash and fluid-filled blisters. Shingles is not related to genital herpes, which is a sexually transmitted infection. Shingles only develops in people who:  Have had chickenpox.  Have received the chickenpox vaccine. (This is rare.)  What are the causes? Shingles is caused by varicella-zoster virus (VZV). This is the same virus that causes chickenpox. After exposure to VZV, the virus stays in the body in an inactive (dormant) state. Shingles develops if the virus reactivates. This can happen many years after the initial exposure to VZV. It is not known what causes this virus to reactivate. What increases the risk? People who have had chickenpox or received the chickenpox vaccine are at risk for shingles. Infection is more common in people who:  Are older than age 35.  Have a weakened defense (immune) system, such as those with HIV, AIDS, or cancer.  Are taking medicines that weaken the immune system, such as transplant medicines.  Are under great stress.  What are the signs or symptoms? Early symptoms of this condition include itching, tingling, and pain in an area on your skin. Pain may be described as burning, stabbing, or throbbing. A few days or weeks after symptoms start, a painful red rash appears, usually on one side of the body in a bandlike or beltlike pattern. The rash eventually turns into fluid-filled blisters that break open, scab over, and dry up in about 2-3 weeks. At any time during the infection, you may also develop:  A fever.  Chills.  A headache.  An upset stomach.  How is this diagnosed? This condition is diagnosed with a skin exam. Sometimes, skin or fluid samples are  taken from the blisters before a diagnosis is made. These samples are examined under a microscope or sent to a lab for testing. How is this treated? There is no specific cure for this condition. Your health care provider will probably prescribe medicines to help you manage pain, recover more quickly, and avoid long-term problems. Medicines may include:  Antiviral drugs.  Anti-inflammatory drugs.  Pain medicines.  If the area involved is on your face, you may be referred to a specialist, such as an eye doctor (ophthalmologist) or an ear, nose, and throat (ENT) doctor to help you avoid eye problems, chronic pain, or disability. Follow these instructions at home: Medicines  Take medicines only as directed by your health care provider.  Apply an anti-itch or numbing cream to the affected area as directed by your health care provider. Blister and Rash Care  Take a cool bath or apply cool compresses to the area of the rash or blisters as directed by your health care provider. This may help with pain and itching.  Keep your rash covered with a loose bandage (dressing). Wear loose-fitting clothing to help ease the pain of material rubbing against the rash.  Keep your rash and blisters clean with mild soap and cool water or as directed by your health care provider.  Check your rash every day for signs of infection. These include redness, swelling, and pain that lasts or increases.  Do not pick your blisters.  Do not scratch your rash. General instructions  Rest as directed by your  health care provider.  Keep all follow-up visits as directed by your health care provider. This is important.  Until your blisters scab over, your infection can cause chickenpox in people who have never had it or been vaccinated against it. To prevent this from happening, avoid contact with other people, especially: ? Babies. ? Pregnant women. ? Children who have eczema. ? Elderly people who have  transplants. ? People who have chronic illnesses, such as leukemia or AIDS. Contact a health care provider if:  Your pain is not relieved with prescribed medicines.  Your pain does not get better after the rash heals.  Your rash looks infected. Signs of infection include redness, swelling, and pain that lasts or increases. Get help right away if:  The rash is on your face or nose.  You have facial pain, pain around your eye area, or loss of feeling on one side of your face.  You have ear pain or you have ringing in your ear.  You have loss of taste.  Your condition gets worse. This information is not intended to replace advice given to you by your health care provider. Make sure you discuss any questions you have with your health care provider. Document Released: 03/13/2005 Document Revised: 11/07/2015 Document Reviewed: 01/22/2014 Elsevier Interactive Patient Education  2017 [employer].

## 2016-10-03 NOTE — Assessment & Plan Note (Signed)
L C2-3 distribution with pain (ear/headache/neck) Finishing acyclovir Rash improved Disc pain control with tramadol with caution of sedation Refilled 30 tramadol/ printed for future need Consider gabapentin if not imp  She will get Shingrix when it is available again

## 2016-10-24 ENCOUNTER — Other Ambulatory Visit (INDEPENDENT_AMBULATORY_CARE_PROVIDER_SITE_OTHER): Payer: Medicare Other

## 2016-10-24 DIAGNOSIS — E119 Type 2 diabetes mellitus without complications: Secondary | ICD-10-CM

## 2016-10-24 DIAGNOSIS — I10 Essential (primary) hypertension: Secondary | ICD-10-CM

## 2016-10-24 DIAGNOSIS — E7800 Pure hypercholesterolemia, unspecified: Secondary | ICD-10-CM

## 2016-10-24 LAB — COMPREHENSIVE METABOLIC PANEL
ALBUMIN: 4.3 g/dL (ref 3.5–5.2)
ALK PHOS: 58 U/L (ref 39–117)
ALT: 13 U/L (ref 0–35)
AST: 14 U/L (ref 0–37)
BUN: 16 mg/dL (ref 6–23)
CHLORIDE: 104 meq/L (ref 96–112)
CO2: 29 meq/L (ref 19–32)
Calcium: 10 mg/dL (ref 8.4–10.5)
Creatinine, Ser: 0.8 mg/dL (ref 0.40–1.20)
GFR: 73.36 mL/min (ref >60.00)
GLUCOSE: 149 mg/dL — AB (ref 70–99)
POTASSIUM: 4.1 meq/L (ref 3.5–5.1)
SODIUM: 141 meq/L (ref 135–145)
Total Bilirubin: 0.9 mg/dL (ref 0.2–1.2)
Total Protein: 6.9 g/dL (ref 6.0–8.3)

## 2016-10-24 LAB — LIPID PANEL
CHOLESTEROL: 121 mg/dL (ref 0–200)
HDL: 52 mg/dL (ref >39.00)
LDL CALC: 41 mg/dL (ref 0–99)
NonHDL: 69.06
Total CHOL/HDL Ratio: 2
Triglycerides: 140 mg/dL (ref 0.0–149.0)
VLDL: 28 mg/dL (ref 0.0–40.0)

## 2016-10-24 LAB — HEMOGLOBIN A1C: HEMOGLOBIN A1C: 6.9 % — AB (ref 4.6–6.5)

## 2016-10-27 ENCOUNTER — Encounter: Payer: Self-pay | Admitting: Family Medicine

## 2016-10-27 ENCOUNTER — Ambulatory Visit (INDEPENDENT_AMBULATORY_CARE_PROVIDER_SITE_OTHER): Payer: Medicare Other | Admitting: Family Medicine

## 2016-10-27 VITALS — BP 120/70 | HR 82 | Ht 60.5 in | Wt 210.0 lb

## 2016-10-27 DIAGNOSIS — I10 Essential (primary) hypertension: Secondary | ICD-10-CM

## 2016-10-27 DIAGNOSIS — E7800 Pure hypercholesterolemia, unspecified: Secondary | ICD-10-CM | POA: Diagnosis not present

## 2016-10-27 DIAGNOSIS — E119 Type 2 diabetes mellitus without complications: Secondary | ICD-10-CM

## 2016-10-27 NOTE — Patient Instructions (Addendum)
Try to get your carbohydrates from the produce section (but eat sweet potato instead of white potato)  Avoid the processed carbs - pasta/bread/snack foods /sweets/rice  Stay out of the middle of the supermarket   Your A1C is up to 6.9 - we need to watch this  Any weight loss is going to help    Schedule non fasting labs for A1C in 3 months   Follow up for annual exam after 1/31

## 2016-10-27 NOTE — Progress Notes (Signed)
Subjective:    Patient ID: Gabriella Howe, female    DOB: [birth year], 82 y.o.   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  Diet is pretty good  Knows she retains water- overall wt is stable  40.34 kg/m     bp is stable today   No cp or palpitations or headaches or edema  No side effects to medicines  BP Readings from Last 3 Encounters:  10/27/16 120/70  10/03/16 126/82  04/26/16 122/78      Diabetes Home sugar results  DM diet -does not stick to it  Exercise -active lifestyle/no extra  Has had DM ed  Symptoms-none  A1C last  Lab Results  Component Value Date   HGBA1C 6.9 (H) 10/24/2016  this is up from 6.7  No problems with medications  Glipizide xl 5 and metformin 1000 mg  Renal protection  Ace  Last eye exam 4/18   Hyperlipidemia Lab Results  Component Value Date   CHOL 121 10/24/2016   CHOL 118 04/20/2016   CHOL 118 10/12/2015   Lab Results  Component Value Date   HDL 52.00 10/24/2016   HDL 52.00 04/20/2016   HDL 52.90 10/12/2015   Lab Results  Component Value Date   LDLCALC 41 10/24/2016   LDLCALC 36 04/20/2016   LDLCALC 44 10/12/2015   Lab Results  Component Value Date   TRIG 140.0 10/24/2016   TRIG 152.0 (H) 04/20/2016   TRIG 104.0 10/12/2015   Lab Results  Component Value Date   CHOLHDL 2 10/24/2016   CHOLHDL 2 04/20/2016   CHOLHDL 2 10/12/2015   No results found for: LDLDIRECT On statin and diet   Lot of stress- shingles Caring for brother with colon cancer   Patient Active Problem List   Diagnosis Date Noted  . History of shingles 10/03/2016    . Routine general medical examination at a health care facility 04/14/2015  . Back pain 07/28/2014  . Knee pain 07/28/2014  . Encounter for Medicare annual wellness exam 11/19/2012  . History of kidney stones 11/19/2012  . Kidney stone 06/07/2011  . DIASTOLIC HEART FAILURE, CHRONIC 07/29/2009  . DYSPNEA 07/23/2009  . HYPERCHOLESTEROLEMIA, PURE 09/13/2006  . FLUID RETENTION 09/10/2006  . Morbid obesity ([place]) 09/10/2006  . CARPAL TUNNEL SYNDROME 09/10/2006  . Essential hypertension 09/10/2006  . DIASTOLIC DYSFUNCTION 93/79/0240  . REACTIVE AIRWAY DISEASE 09/10/2006  . GERD 09/10/2006  . ROSACEA 09/10/2006  . Degeneration of lumbar or lumbosacral intervertebral disc 09/10/2006  . SLEEP APNEA 09/10/2006  . COLONIC POLYPS, HX OF 09/10/2006  . Diabetes type 2, controlled ([place]) 09/06/2006   Past Medical History:  Diagnosis Date  . Adenomatous colon polyp 2003  . Benign neoplasm of colon   . Carpal tunnel syndrome   . Chest pain, unspecified   . CHF (congestive heart failure) (HCC)    diastolic  . Degeneration of intervertebral disc, site unspecified   . Diabetes mellitus    type II  .  Encounter for long-term (current) use of other medications   . Fluid overload   . GERD (gastroesophageal reflux disease)   . Heart disease, unspecified   . Hyperlipidemia   . Hypertension   . Kidney stone 3/12   hosp/ with stent  . Obesity, unspecified   . Rosacea   . Shortness of breath   . Unspecified asthma(493.90)   . Unspecified sleep apnea    Past Surgical History:  Procedure Laterality Date  . ABDOMINAL HYSTERECTOMY     partial, fibroids  . ESOPHAGOGASTRODUODENOSCOPY  2000  . UMBILICAL HERNIA REPAIR  01/2004  . URETERAL STENT PLACEMENT  2/13   for L sided stone/ ecoli sepsis   Social History  Substance Use Topics  . Smoking status: Never Smoker  . Smokeless tobacco: Never Used  . Alcohol use No   History reviewed. No pertinent family history. Allergies  Allergen  Reactions  . Rofecoxib     REACTION: reaction not known  . Tetanus-Diphtheria Toxoids Td     REACTION: sick with fever  . Antihistamines, Chlorpheniramine-Type Palpitations  . Sulfa Antibiotics Rash   Current Outpatient Prescriptions on File Prior to Visit  Medication Sig Dispense Refill  . Ascorbic Acid (VITAMIN C) 500 MG tablet Take 500 mg by mouth daily.      . Aspirin-Caffeine (BC FAST PAIN RELIEF PO) Take by mouth as directed.      . benazepril (LOTENSIN) 20 MG tablet Take 1 tablet (20 mg total) by mouth daily. 90 tablet 3  . CALCIUM-VITAMIN D PO Take 1 tablet by mouth 2 (two) times daily.     . furosemide (LASIX) 40 MG tablet Take 0.5 tablets (20 mg total) by mouth daily. 45 tablet 3  . glipiZIDE (GLUCOTROL XL) 5 MG 24 hr tablet Take 1 tablet (5 mg total) by mouth daily with breakfast. 90 tablet 3  . glucose blood (BAYER CONTOUR TEST) test strip Use to check blood sugar once daily as directed for DM (Dx. E11.9) 100 each 0  . Magnesium 250 MG TABS Take by mouth daily.    . metFORMIN (GLUCOPHAGE) 1000 MG tablet Take 1 tablet (1,000 mg total) by mouth 2 (two) times daily with a meal. 180 tablet 3  . MICROLET LANCETS MISC Use to check blood sugar once daily as directed for DM (Dx. E11.9) 100 each 0  . Multiple Vitamin (MULTIVITAMIN) capsule Take 1 capsule by mouth daily.      . Omega-3 Fatty Acids (FISH OIL PO) Take 1 capsule by mouth daily.     Marland Kitchen omeprazole (PRILOSEC) 20 MG capsule Take 1 capsule (20 mg total) by mouth daily. 90 capsule 3  . Pseudoephedrine HCl (SINUS & ALLERGY 12 HOUR PO) Take by mouth as directed.      . simvastatin (ZOCOR) 40 MG tablet Take 1 tablet (40 mg total) by mouth at bedtime. 90 tablet 3  . traMADol (ULTRAM) 50 MG tablet Take 1 tablet (50 mg total) by mouth every 8 (eight) hours as needed. 30 tablet 0  . triamcinolone cream (KENALOG) 0.1 % Apply 1 application topically 2 (two) times daily. Apply to affected areas twice daily. 30 g 0  . vitamin E 400 UNIT  capsule Take 400 Units by mouth daily.       No current facility-administered medications on file prior to visit.      Review of Systems Review of Systems  Constitutional: Negative for fever, appetite change, fatigue and unexpected weight change.  Eyes: Negative for pain and visual  disturbance.  Respiratory: Negative for cough and shortness of breath.   Cardiovascular: Negative for cp or palpitations    Gastrointestinal: Negative for nausea, diarrhea and constipation.  Genitourinary: Negative for urgency and frequency.  Skin: Negative for pallor or rash   Neurological: Negative for weakness, light-headedness, numbness and headaches.  Hematological: Negative for adenopathy. Does not bruise/bleed easily.  Psychiatric/Behavioral: Negative for dysphoric mood. The patient is not nervous/anxious.  pos for stressors        Objective:   Physical Exam  Constitutional: She appears well-developed and well-nourished. No distress.  obese and well appearing   HENT:  Head: Normocephalic and atraumatic.  Mouth/Throat: Oropharynx is clear and moist.  Eyes: Pupils are equal, round, and reactive to light. Conjunctivae and EOM are normal.  Neck: Normal range of motion. Neck supple. No JVD present. Carotid bruit is not present. No thyromegaly present.  Cardiovascular: Normal rate, regular rhythm, normal heart sounds and intact distal pulses.  Exam reveals no gallop.   Pulmonary/Chest: Effort normal and breath sounds normal. No respiratory distress. She has no wheezes. She has no rales.  No crackles  Abdominal: Soft. Bowel sounds are normal. She exhibits no distension, no abdominal bruit and no mass. There is no tenderness.  Musculoskeletal: She exhibits no edema.  Lymphadenopathy:    She has no cervical adenopathy.  Neurological: She is alert. She has normal reflexes.  Skin: Skin is warm and dry. No rash noted. No pallor.  Psychiatric: She has a normal mood and affect.          Assessment &  Plan:   Problem List Items Addressed This Visit      Cardiovascular and Mediastinum   Essential hypertension - Primary    bp in fair control at this time  BP Readings from Last 1 Encounters:  10/27/16 120/70   No changes needed Disc lifstyle change with low sodium diet and exercise  Labs reviewed         Endocrine   Diabetes type 2, controlled ([place])    Lab Results  Component Value Date   HGBA1C 6.9 (H) 10/24/2016   This is up  Disc imp of lower glycemic diet-she will work on this and wt loss and exercise  Re check 3 mo  [facility]         Other   HYPERCHOLESTEROLEMIA, PURE    Disc goals for lipids and reasons to control them Rev labs with pt Rev low sat fat diet in detail  Well controlled with zocor and diet       Morbid obesity ([place])    Discussed how this problem influences overall health and the risks it imposes  Reviewed plan for weight loss with lower calorie diet (via better food choices and also portion control or program like weight watchers) and exercise building up to or more than 30 minutes 5 days per week including some aerobic activity

## 2016-10-29 NOTE — Assessment & Plan Note (Signed)
bp in fair control at this time  BP Readings from Last 1 Encounters:  10/27/16 120/70   No changes needed Disc lifstyle change with low sodium diet and exercise  Labs reviewed

## 2016-10-29 NOTE — Assessment & Plan Note (Signed)
Disc goals for lipids and reasons to control them Rev labs with pt Rev low sat fat diet in detail Well controlled with zocor and diet  

## 2016-10-29 NOTE — Assessment & Plan Note (Signed)
Discussed how this problem influences overall health and the risks it imposes  Reviewed plan for weight loss with lower calorie diet (via better food choices and also portion control or program like weight watchers) and exercise building up to or more than 30 minutes 5 days per week including some aerobic activity    

## 2016-10-29 NOTE — Assessment & Plan Note (Signed)
Lab Results  Component Value Date   HGBA1C 6.9 (H) 10/24/2016   This is up  Disc imp of lower glycemic diet-she will work on this and wt loss and exercise  Re check 3 mo  [facility]

## 2017-01-26 ENCOUNTER — Ambulatory Visit (INDEPENDENT_AMBULATORY_CARE_PROVIDER_SITE_OTHER): Payer: Medicare Other

## 2017-01-26 ENCOUNTER — Other Ambulatory Visit (INDEPENDENT_AMBULATORY_CARE_PROVIDER_SITE_OTHER): Payer: Medicare Other

## 2017-01-26 DIAGNOSIS — E119 Type 2 diabetes mellitus without complications: Secondary | ICD-10-CM

## 2017-01-26 DIAGNOSIS — Z23 Encounter for immunization: Secondary | ICD-10-CM

## 2017-01-26 LAB — HEMOGLOBIN A1C: HEMOGLOBIN A1C: 6.9 % — AB (ref 4.6–6.5)

## 2017-01-29 ENCOUNTER — Encounter: Payer: Self-pay | Admitting: *Deleted

## 2017-02-18 ENCOUNTER — Other Ambulatory Visit: Payer: Self-pay | Admitting: Family Medicine

## 2017-04-15 ENCOUNTER — Telehealth: Payer: Self-pay | Admitting: Family Medicine

## 2017-04-15 DIAGNOSIS — E7800 Pure hypercholesterolemia, unspecified: Secondary | ICD-10-CM

## 2017-04-15 DIAGNOSIS — E119 Type 2 diabetes mellitus without complications: Secondary | ICD-10-CM

## 2017-04-15 DIAGNOSIS — I10 Essential (primary) hypertension: Secondary | ICD-10-CM

## 2017-04-15 NOTE — Telephone Encounter (Signed)
-----   Message from Eustace Pen, LPN sent at [id number]/62/7035  2:57 PM EST ----- Regarding: Labs 1/25 Lab orders needed. Thank you.  Insurance:  [facility]

## 2017-04-23 ENCOUNTER — Ambulatory Visit (INDEPENDENT_AMBULATORY_CARE_PROVIDER_SITE_OTHER): Payer: Medicare Other

## 2017-04-23 VITALS — BP 118/78 | HR 71 | Temp 97.8°F | Ht 61.0 in | Wt 210.0 lb

## 2017-04-23 DIAGNOSIS — E7800 Pure hypercholesterolemia, unspecified: Secondary | ICD-10-CM | POA: Diagnosis not present

## 2017-04-23 DIAGNOSIS — I10 Essential (primary) hypertension: Secondary | ICD-10-CM

## 2017-04-23 DIAGNOSIS — Z0000 Encounter for general adult medical examination without abnormal findings: Secondary | ICD-10-CM | POA: Diagnosis not present

## 2017-04-23 DIAGNOSIS — E119 Type 2 diabetes mellitus without complications: Secondary | ICD-10-CM

## 2017-04-23 LAB — CBC WITH DIFFERENTIAL/PLATELET
BASOS ABS: 0.1 10*3/uL (ref 0.0–0.1)
Basophils Relative: 0.9 % (ref 0.0–3.0)
EOS ABS: 0.2 10*3/uL (ref 0.0–0.7)
Eosinophils Relative: 3 % (ref 0.0–5.0)
HCT: 35 % — ABNORMAL LOW (ref 36.0–46.0)
HEMOGLOBIN: 11 g/dL — AB (ref 12.0–15.0)
Lymphocytes Relative: 26.3 % (ref 12.0–46.0)
Lymphs Abs: 1.7 10*3/uL (ref 0.7–4.0)
MCHC: 31.6 g/dL (ref 30.0–36.0)
MCV: 78.9 fl (ref 78.0–100.0)
MONO ABS: 0.4 10*3/uL (ref 0.1–1.0)
Monocytes Relative: 6.5 % (ref 3.0–12.0)
Neutro Abs: 4.2 10*3/uL (ref 1.4–7.7)
Neutrophils Relative %: 63.3 % (ref 43.0–77.0)
Platelets: 229 10*3/uL (ref 150.0–400.0)
RBC: 4.43 Mil/uL (ref 3.87–5.11)
RDW: 16.1 % — ABNORMAL HIGH (ref 11.5–15.5)
WBC: 6.6 10*3/uL (ref 4.0–10.5)

## 2017-04-23 LAB — LIPID PANEL
CHOL/HDL RATIO: 2
Cholesterol: 121 mg/dL (ref 0–200)
HDL: 53.6 mg/dL (ref >39.00)
LDL Cholesterol: 44 mg/dL (ref 0–99)
NonHDL: 67.63
Triglycerides: 118 mg/dL (ref 0.0–149.0)
VLDL: 23.6 mg/dL (ref 0.0–40.0)

## 2017-04-23 LAB — COMPREHENSIVE METABOLIC PANEL
ALBUMIN: 4.2 g/dL (ref 3.5–5.2)
ALK PHOS: 54 U/L (ref 39–117)
ALT: 14 U/L (ref 0–35)
AST: 15 U/L (ref 0–37)
BUN: 23 mg/dL (ref 6–23)
CHLORIDE: 107 meq/L (ref 96–112)
CO2: 28 mEq/L (ref 19–32)
CREATININE: 0.85 mg/dL (ref 0.40–1.20)
Calcium: 9.7 mg/dL (ref 8.4–10.5)
GFR: 68.32 mL/min (ref >60.00)
GLUCOSE: 150 mg/dL — AB (ref 70–99)
Potassium: 4.7 mEq/L (ref 3.5–5.1)
SODIUM: 144 meq/L (ref 135–145)
TOTAL PROTEIN: 6.9 g/dL (ref 6.0–8.3)
Total Bilirubin: 0.9 mg/dL (ref 0.2–1.2)

## 2017-04-23 LAB — HEMOGLOBIN A1C: Hgb A1c MFr Bld: 7.2 % — ABNORMAL HIGH (ref 4.6–6.5)

## 2017-04-23 LAB — TSH: TSH: 1.01 u[IU]/mL (ref 0.35–4.50)

## 2017-04-23 NOTE — Patient Instructions (Signed)
Gabriella Howe , Thank you for taking time to come for your Medicare Wellness Visit. I appreciate your ongoing commitment to your health goals. Please review the following plan we discussed and let me know if I can assist you in the future.   These are the goals we discussed: Goals    . DIET - INCREASE WATER INTAKE     Starting 04/23/2017, I will attempt to drink at least 6 glasses of water daily and to continue to make healthy food choices.        This is a list of the screening recommended for you and due dates:  Health Maintenance  Topic Date Due  . Complete foot exam   04/30/2017*  . Eye exam for diabetics  07/19/2017  . Hemoglobin A1C  07/26/2017  . Colon Cancer Screening  07/28/2017  . Tetanus Vaccine  05/16/2021  . Flu Shot  Completed  . DEXA scan (bone density measurement)  Completed  . Pneumonia vaccines  Completed  *Topic was postponed. The date shown is not the original due date.   Preventive Care for Adults  A healthy lifestyle and preventive care can promote health and wellness. Preventive health guidelines for adults include the following key practices.  . A routine yearly physical is a good way to check with your health care provider about your health and preventive screening. It is a chance to share any concerns and updates on your health and to receive a thorough exam.  . Visit your dentist for a routine exam and preventive care every 6 months. Brush your teeth twice a day and floss once a day. Good oral hygiene prevents tooth decay and gum disease.  . The frequency of eye exams is based on your age, health, family medical history, use  of contact lenses, and other factors. Follow your health care provider's recommendations for frequency of eye exams.  . Eat a healthy diet. Foods like vegetables, fruits, whole grains, low-fat dairy products, and lean protein foods contain the nutrients you need without too many calories. Decrease your intake of foods high in solid  fats, added sugars, and salt. Eat the right amount of calories for you. Get information about a proper diet from your health care provider, if necessary.  . Regular physical exercise is one of the most important things you can do for your health. Most adults should get at least 150 minutes of moderate-intensity exercise (any activity that increases your heart rate and causes you to sweat) each week. In addition, most adults need muscle-strengthening exercises on 2 or more days a week.  Silver Sneakers may be a benefit available to you. To determine eligibility, you may visit the website: www.silversneakers.com or contact program at 331-277-4372 Mon-Fri between 8AM-8PM.   . Maintain a healthy weight. The body mass index (BMI) is a screening tool to identify possible weight problems. It provides an estimate of body fat based on height and weight. Your health care provider can find your BMI and can help you achieve or maintain a healthy weight.   For adults 20 years and older: ? A BMI below 18.5 is considered underweight. ? A BMI of 18.5 to 24.9 is normal. ? A BMI of 25 to 29.9 is considered overweight. ? A BMI of 30 and above is considered obese.   . Maintain normal blood lipids and cholesterol levels by exercising and minimizing your intake of saturated fat. Eat a balanced diet with plenty of fruit and vegetables. Blood tests for lipids and  cholesterol should begin at age 7 and be repeated every 5 years. If your lipid or cholesterol levels are high, you are over 50, or you are at high risk for heart disease, you may need your cholesterol levels checked more frequently. Ongoing high lipid and cholesterol levels should be treated with medicines if diet and exercise are not working.  . If you smoke, find out from your health care provider how to quit. If you do not use tobacco, please do not start.  . If you choose to drink alcohol, please do not consume more than 2 drinks per day. One drink is  considered to be 12 ounces (355 mL) of beer, 5 ounces (148 mL) of wine, or 1.5 ounces (44 mL) of liquor.  . If you are 69-87 years old, ask your health care provider if you should take aspirin to prevent strokes.  . Use sunscreen. Apply sunscreen liberally and repeatedly throughout the day. You should seek shade when your shadow is shorter than you. Protect yourself by wearing long sleeves, pants, a wide-brimmed hat, and sunglasses year round, whenever you are outdoors.  . Once a month, do a whole body skin exam, using a mirror to look at the skin on your back. Tell your health care provider of new moles, moles that have irregular borders, moles that are larger than a pencil eraser, or moles that have changed in shape or color.

## 2017-04-23 NOTE — Progress Notes (Signed)
Pre visit review using our clinic review tool, if applicable. No additional management support is needed unless otherwise documented below in the visit note. 

## 2017-04-23 NOTE — Progress Notes (Signed)
PCP notes:   Health maintenance:  Foot exam - PCP please address at next appt  Abnormal screenings:   Depression score: 1 Depression screen [facility] 2/9 04/23/2017 04/20/2016 04/14/2015 11/28/2013 11/19/2012  Decreased Interest 0 0 0 0 0  Down, Depressed, Hopeless 1 1 0 0 0  PHQ - 2 Score 1 1 0 0 0  Altered sleeping 0 - - - -  Tired, decreased energy 0 - - - -  Change in appetite 0 - - - -  Feeling bad or failure about yourself  0 - - - -  Trouble concentrating 0 - - - -  Moving slowly or fidgety/restless 0 - - - -  Suicidal thoughts 0 - - - -  PHQ-9 Score 1 - - - -  Difficult doing work/chores Not difficult at all - - - -    Hearing - failed  Hearing Screening   125Hz  250Hz  500Hz  1000Hz  2000Hz  3000Hz  4000Hz  6000Hz  8000Hz   Right ear:   0 40 40  40    Left ear:   0 40 40  40      Patient concerns:   None  Nurse concerns:  None  Next PCP appt:   04/30/17 @ 0930  I reviewed health advisor's note, was available for consultation, and agree with documentation and plan. Loura Pardon MD

## 2017-04-23 NOTE — Progress Notes (Signed)
Subjective:   Gabriella Howe is a 83 y.o. female who presents for Medicare Annual (Subsequent) preventive examination.  Review of Systems:  N/A Cardiac Risk Factors include: advanced age (>74men, >35 women);obesity (BMI >30kg/m2);diabetes mellitus;dyslipidemia;hypertension;sedentary lifestyle     Objective:     Vitals: BP 118/78 (BP Location: Right Arm, Patient Position: Sitting, Cuff Size: Large)   Pulse 71   Temp 97.8 F (36.6 C) (Oral)   Ht 5\' 1"  (1.549 m) Comment: no shoes  Wt 210 lb (95.3 kg)   SpO2 97%   BMI 39.68 kg/m   Body mass index is 39.68 kg/m.  Advanced Directives 04/23/2017 04/20/2016 02/05/2016  Does Patient Have a Medical Advance Directive? Yes Yes Yes  Type of Paramedic of [place];Living will [place];Living will [place];Living will  Does patient want to make changes to medical advance directive? - - No - Patient declined  Copy of [place] in Chart? No - copy requested Yes Yes    Tobacco Social History   Tobacco Use  Smoking Status Never Smoker  Smokeless Tobacco Never Used     Counseling given: No   Clinical Intake:  Pre-visit preparation completed: Yes  Pain : No/denies pain Pain Score: 5      Nutritional Status: BMI > 30  Obese Nutritional Risks: None Diabetes: Yes CBG done?: No Did pt. bring in CBG monitor from home?: No  How often do you need to have someone help you when you read instructions, pamphlets, or other written materials from your doctor or pharmacy?: 1 - Never What is the last grade level you completed in school?: 12th grade + some college  Interpreter Needed?: No  Comments: pt is a widow and lives alone Information entered by :: LPinson, LPN  Past Medical History:  Diagnosis Date  . Adenomatous colon polyp 2003  . Benign neoplasm of colon   . Carpal tunnel syndrome   . Chest pain, unspecified   . CHF (congestive heart  failure) (HCC)    diastolic  . Degeneration of intervertebral disc, site unspecified   . Diabetes mellitus    type II  . Encounter for long-term (current) use of other medications   . Fluid overload   . GERD (gastroesophageal reflux disease)   . Heart disease, unspecified   . Hyperlipidemia   . Hypertension   . Kidney stone 3/12   hosp/ with stent  . Obesity, unspecified   . Rosacea   . Shortness of breath   . Unspecified asthma(493.90)   . Unspecified sleep apnea    Past Surgical History:  Procedure Laterality Date  . ABDOMINAL HYSTERECTOMY     partial, fibroids  . ESOPHAGOGASTRODUODENOSCOPY  2000  . UMBILICAL HERNIA REPAIR  01/2004  . URETERAL STENT PLACEMENT  2/13   for L sided stone/ ecoli sepsis   History reviewed. No pertinent family history. Social History   Socioeconomic History  . Marital status: Widowed    Spouse name: None  . Number of children: None  . Years of education: None  . Highest education level: None  Social Needs  . Financial resource strain: None  . Food insecurity - worry: None  . Food insecurity - inability: None  . Transportation needs - medical: None  . Transportation needs - non-medical: None  Occupational History  . None  Tobacco Use  . Smoking status: Never Smoker  . Smokeless tobacco: Never Used  Substance and Sexual Activity  . Alcohol  use: No    Alcohol/week: 0.0 oz  . Drug use: No  . Sexual activity: No  Other Topics Concern  . None  Social History Narrative  . None    Outpatient Encounter Medications as of 04/23/2017  Medication Sig  . Ascorbic Acid (VITAMIN C) 500 MG tablet Take 500 mg by mouth daily.    . Aspirin-Caffeine (BC FAST PAIN RELIEF PO) Take by mouth as directed.    . benazepril (LOTENSIN) 20 MG tablet TAKE 1 TABLET BY MOUTH  DAILY  . CALCIUM-VITAMIN D PO Take 1 tablet by mouth 2 (two) times daily.   . furosemide (LASIX) 40 MG tablet TAKE ONE-HALF TABLET BY  MOUTH DAILY  . glipiZIDE (GLUCOTROL XL) 5 MG 24  hr tablet TAKE 1 TABLET BY MOUTH  DAILY WITH BREAKFAST  . glucose blood (BAYER CONTOUR TEST) test strip Use to check blood sugar once daily as directed for DM (Dx. E11.9)  . Magnesium 250 MG TABS Take by mouth daily.  . metFORMIN (GLUCOPHAGE) 1000 MG tablet TAKE 1 TABLET BY MOUTH TWO  TIMES DAILY WITH A MEAL  . MICROLET LANCETS MISC Use to check blood sugar once daily as directed for DM (Dx. E11.9)  . Multiple Vitamin (MULTIVITAMIN) capsule Take 1 capsule by mouth daily.    . Omega-3 Fatty Acids (FISH OIL PO) Take 1 capsule by mouth daily.   Marland Kitchen omeprazole (PRILOSEC) 20 MG capsule TAKE 1 CAPSULE BY MOUTH  DAILY  . Pseudoephedrine HCl (SINUS & ALLERGY 12 HOUR PO) Take by mouth as directed.    . simvastatin (ZOCOR) 40 MG tablet TAKE 1 TABLET BY MOUTH AT  BEDTIME  . traMADol (ULTRAM) 50 MG tablet Take 1 tablet (50 mg total) by mouth every 8 (eight) hours as needed.  . triamcinolone cream (KENALOG) 0.1 % Apply 1 application topically 2 (two) times daily. Apply to affected areas twice daily. (Patient taking differently: Apply 1 application topically as needed. Apply to affected areas twice daily.)  . vitamin E 400 UNIT capsule Take 400 Units by mouth daily.     No facility-administered encounter medications on file as of 04/23/2017.     Activities of Daily Living In your present state of health, do you have any difficulty performing the following activities: 04/23/2017  Hearing? N  Vision? N  Difficulty concentrating or making decisions? Y  Comment sometimes; difficulty recalling names  Walking or climbing stairs? Y  Comment back pain  Dressing or bathing? N  Doing errands, shopping? N  Preparing Food and eating ? N  Using the Toilet? N  In the past six months, have you accidently leaked urine? N  Do you have problems with loss of bowel control? N  Managing your Medications? N  Managing your Finances? N  Housekeeping or managing your Housekeeping? N  Some recent data might be hidden     Patient Care Team: Tower, Wynelle Fanny, MD as PCP - General Vin-Parikh, Deirdre Peer, MD as Referring Physician (Ophthalmology) Charmian Muff, DDS as Referring Physician (Dentistry)    Assessment:   This is a routine wellness examination for Drema.   Hearing Screening   125Hz  250Hz  500Hz  1000Hz  2000Hz  3000Hz  4000Hz  6000Hz  8000Hz   Right ear:   0 40 40  40    Left ear:   0 40 40  40    Vision Screening Comments: Last vision exam in April 2018 at [place] and Dietary recommendations Current Exercise Habits: The patient does not  participate in regular exercise at present, Exercise limited by: None identified  Goals    . DIET - INCREASE WATER INTAKE     Starting 04/23/2017, I will attempt to drink at least 6 glasses of water daily and to continue to make healthy food choices.        Fall Risk Fall Risk  04/23/2017 04/20/2016 04/14/2015 11/28/2013 11/19/2012  Falls in the past year? No No No No No   Depression Screen PHQ 2/9 Scores 04/23/2017 04/20/2016 04/14/2015 11/28/2013  PHQ - 2 Score 1 1 0 0  PHQ- 9 Score 1 - - -     Cognitive Function MMSE - Mini Mental State Exam 04/23/2017 04/20/2016  Orientation to time 5 5  Orientation to Place 5 5  Registration 3 3  Attention/ Calculation 0 0  Recall 3 2  Recall-comments - pt was unable to recall 1 of 3 words  Language- name 2 objects 0 0  Language- repeat 1 1  Language- follow 3 step command 3 3  Language- read & follow direction 0 0  Write a sentence 0 0  Copy design 0 0  Total score 20 19       PLEASE NOTE: A Mini-Cog screen was completed. Maximum score is 20. A value of 0 denotes this part of Folstein MMSE was not completed or the patient failed this part of the Mini-Cog screening.   Mini-Cog Screening Orientation to Time - Max 5 pts Orientation to Place - Max 5 pts Registration - Max 3 pts Recall - Max 3 pts Language Repeat - Max 1 pts Language Follow 3 Step Command - Max 3  pts   Immunization History  Administered Date(s) Administered  . Influenza Split 01/24/2011  . Influenza Whole 05/10/2009  . Influenza,inj,Quad PF,6+ Mos 11/28/2013, 04/14/2015, 04/20/2016, 01/26/2017  . Pneumococcal Conjugate-13 11/28/2013  . Pneumococcal Polysaccharide-23 03/28/2003   Screening Tests Health Maintenance  Topic Date Due  . FOOT EXAM  04/30/2017 (Originally 10/14/2016)  . OPHTHALMOLOGY EXAM  07/19/2017  . HEMOGLOBIN A1C  07/26/2017  . COLONOSCOPY  07/28/2017  . TETANUS/TDAP  05/16/2021  . INFLUENZA VACCINE  Completed  . DEXA SCAN  Completed  . PNA vac Low Risk Adult  Completed      Plan:     I have personally reviewed, addressed, and noted the following in the patient's chart:  A. Medical and social history B. Use of alcohol, tobacco or illicit drugs  C. Current medications and supplements D. Functional ability and status E.  Nutritional status F.  Physical activity G. Advance directives H. List of other physicians I.  Hospitalizations, surgeries, and ER visits in previous 12 months J.  [place] to include hearing, vision, cognitive, depression L. Referrals and appointments - none  In addition, I have reviewed and discussed with patient certain preventive protocols, quality metrics, and best practice recommendations. A written personalized care plan for preventive services as well as general preventive health recommendations were provided to patient.  See attached scanned questionnaire for additional information.   Signed,   Lindell Noe, MHA, BS, LPN Health Coach

## 2017-04-30 ENCOUNTER — Ambulatory Visit (INDEPENDENT_AMBULATORY_CARE_PROVIDER_SITE_OTHER): Payer: Medicare Other | Admitting: Family Medicine

## 2017-04-30 ENCOUNTER — Encounter: Payer: Self-pay | Admitting: Family Medicine

## 2017-04-30 VITALS — BP 130/80 | HR 73 | Temp 97.8°F | Resp 16 | Ht 61.0 in | Wt 211.0 lb

## 2017-04-30 DIAGNOSIS — Z0000 Encounter for general adult medical examination without abnormal findings: Secondary | ICD-10-CM

## 2017-04-30 DIAGNOSIS — E7800 Pure hypercholesterolemia, unspecified: Secondary | ICD-10-CM

## 2017-04-30 DIAGNOSIS — Z8619 Personal history of other infectious and parasitic diseases: Secondary | ICD-10-CM | POA: Diagnosis not present

## 2017-04-30 DIAGNOSIS — D649 Anemia, unspecified: Secondary | ICD-10-CM | POA: Diagnosis not present

## 2017-04-30 DIAGNOSIS — I10 Essential (primary) hypertension: Secondary | ICD-10-CM | POA: Diagnosis not present

## 2017-04-30 DIAGNOSIS — I5032 Chronic diastolic (congestive) heart failure: Secondary | ICD-10-CM

## 2017-04-30 DIAGNOSIS — D509 Iron deficiency anemia, unspecified: Secondary | ICD-10-CM | POA: Insufficient documentation

## 2017-04-30 DIAGNOSIS — Z86010 Personal history of colonic polyps: Secondary | ICD-10-CM

## 2017-04-30 DIAGNOSIS — E119 Type 2 diabetes mellitus without complications: Secondary | ICD-10-CM

## 2017-04-30 MED ORDER — SIMVASTATIN 40 MG PO TABS: 40.0000 mg | ORAL_TABLET | Freq: Every day | ORAL | 3 refills | Status: DC

## 2017-04-30 MED ORDER — METFORMIN HCL 1000 MG PO TABS: ORAL_TABLET | ORAL | 3 refills | Status: DC

## 2017-04-30 MED ORDER — BENAZEPRIL HCL 20 MG PO TABS: 20.0000 mg | ORAL_TABLET | Freq: Every day | ORAL | 3 refills | Status: DC

## 2017-04-30 MED ORDER — OMEPRAZOLE 20 MG PO CPDR: 20.0000 mg | DELAYED_RELEASE_CAPSULE | Freq: Every day | ORAL | 3 refills | Status: DC

## 2017-04-30 MED ORDER — GLIPIZIDE ER 5 MG PO TB24: 5.0000 mg | ORAL_TABLET | Freq: Every day | ORAL | 3 refills | Status: DC

## 2017-04-30 NOTE — Assessment & Plan Note (Signed)
Reviewed health habits including diet and exercise and skin cancer prevention Reviewed appropriate screening tests for age  Also reviewed health mt list, fam hx and immunization status , as well as social and family history   See HPI Labs reviewed  Enc wt loss  amw rev  Declines breast cancer screening  Will get colonoscopy in may for 3 y recall  Will watch cbc - Hb down slt again - ? Reason  Urged to get on wt list for shingrix

## 2017-04-30 NOTE — Assessment & Plan Note (Signed)
Lab Results  Component Value Date   HGBA1C 7.2 (H) 04/23/2017   This is up  Pt states she is eating sweets/junk food and plans to stop now Disc exercise  Also foot and eye care  F/u 3 mo with lab prior  Continue glipizide and metformin

## 2017-04-30 NOTE — Assessment & Plan Note (Signed)
Enc her to get on a wait list for the shingrix vaccine at pharmacy

## 2017-04-30 NOTE — Assessment & Plan Note (Signed)
bp in fair control at this time  BP Readings from Last 1 Encounters:  04/30/17 130/80   No changes needed Disc lifstyle change with low sodium diet and exercise  Labs reviewed  Wt loss enc

## 2017-04-30 NOTE — Patient Instructions (Addendum)
If you are interested in the new shingles vaccine (Shingrix) - call your local pharmacy to check on coverage and availability     Try to get most of your carbohydrates from produce (with the exception of white potatoes)  Eat less bread/pasta/rice/snack foods/cereals/sweets and other items from the middle of the grocery store (processed carbs)  Don't buy sweets and junk food /keep it out of the house   Follow up in 3 months   You are due for a colonoscopy in may  Call us in April to set this up

## 2017-04-30 NOTE — Assessment & Plan Note (Signed)
Disc goals for lipids and reasons to control them Rev labs with pt Rev low sat fat diet in detail Well controlled with zocor and diet

## 2017-04-30 NOTE — Assessment & Plan Note (Signed)
No symptoms currently.

## 2017-04-30 NOTE — Assessment & Plan Note (Signed)
Due for colonoscopy in may  Will call to schedule  She may want to req a certain provider since dr Olevia Perches retired

## 2017-04-30 NOTE — Assessment & Plan Note (Signed)
Discussed how this problem influences overall health and the risks it imposes  Reviewed plan for weight loss with lower calorie diet (via better food choices and also portion control or program like weight watchers) and exercise building up to or more than 30 minutes 5 days per week including some aerobic activity   Made plan to cut processed carbs Also stay active as tolerated

## 2017-04-30 NOTE — Progress Notes (Signed)
Subjective:    Patient ID: Gabriella Howe, female    DOB: [birth year], 83 y.o.   MRN: [id number]  HPI Here for health maintenance exam and to review chronic medical problems   Feels good overall  Busy  Taking care of herself   Had amw this month Missed low tones on hearing screen  This does not bother her at all  Does not think it is a safety issue   Wt Readings from Last 3 Encounters:  04/30/17 211 lb (95.7 kg)  04/23/17 210 lb (95.3 kg)  10/27/16 210 lb (95.3 kg)  up from 204 last January Tends to fluctuate  Has to control junk food  She is busy/active but no exercise program   (climbs stairs/carries heavy bags)  39.87 kg/m   Eye exam 4/18  Colonoscopy 5/16 with 3 y recall for polyps  Brother had colon cancer    dexa 1/01 Declines further eval or tx  No falls or fractures   Declines breast cancer screening  No lumps on exam   Zoster status - had not gotten on wait list for the shingrix She has had shingles 3 times   bp is stable today  No cp or palpitations or headaches or edema  No side effects to medicines  BP Readings from Last 3 Encounters:  04/30/17 (!) 142/78  04/23/17 118/78  10/27/16 120/70  bp is better today on 2nd check  BP: 130/80      DM2 Lab Results  Component Value Date   HGBA1C 7.2 (H) 04/23/2017  Up from 6.9 She has been eating "junk" and wants to change that  Eats cookies at night - eats sweets every day / snacking  She gained weight from last year     Hyperlipidemia Lab Results  Component Value Date   CHOL 121 04/23/2017   CHOL 121 10/24/2016   CHOL 118 04/20/2016   Lab Results  Component Value Date   HDL 53.60 04/23/2017   HDL 52.00 10/24/2016   HDL 52.00 04/20/2016   Lab Results  Component Value Date   LDLCALC 44 04/23/2017   LDLCALC 41 10/24/2016   LDLCALC 36 04/20/2016   Lab Results  Component Value Date   TRIG 118.0 04/23/2017   TRIG 140.0 10/24/2016   TRIG 152.0 (H) 04/20/2016   Lab Results    Component Value Date   CHOLHDL 2 04/23/2017   CHOLHDL 2 10/24/2016   CHOLHDL 2 04/20/2016   No results found for: LDLDIRECT   Simvastatin and diet  Still looks very good   Lab Results  Component Value Date   CREATININE 0.85 04/23/2017   BUN 23 04/23/2017   NA 144 04/23/2017   K 4.7 04/23/2017   CL 107 04/23/2017   CO2 28 04/23/2017    Lab Results  Component Value Date   ALT 14 04/23/2017   AST 15 04/23/2017   ALKPHOS 54 04/23/2017   BILITOT 0.9 04/23/2017   Lab Results  Component Value Date   TSH 1.01 04/23/2017    Lab Results  Component Value Date   WBC 6.6 04/23/2017   HGB 11.0 (L) 04/23/2017   HCT 35.0 (L) 04/23/2017   MCV 78.9 04/23/2017   PLT 229.0 04/23/2017    No stool changes occ gets stomach upset if she eats out  No blood in pain   Patient Active Problem List   Diagnosis Date Noted  . Mild anemia 04/30/2017  . History of shingles 10/03/2016  . Routine general medical examination  at a health care facility 04/14/2015  . Back pain 07/28/2014  . Knee pain 07/28/2014  . Encounter for Medicare annual wellness exam 11/19/2012  . History of kidney stones 11/19/2012  . Kidney stone 06/07/2011  . DIASTOLIC HEART FAILURE, CHRONIC 07/29/2009  . DYSPNEA 07/23/2009  . HYPERCHOLESTEROLEMIA, PURE 09/13/2006  . FLUID RETENTION 09/10/2006  . Morbid obesity ([place]) 09/10/2006  . CARPAL TUNNEL SYNDROME 09/10/2006  . Essential hypertension 09/10/2006  . DIASTOLIC DYSFUNCTION 27/08/2374  . REACTIVE AIRWAY DISEASE 09/10/2006  . GERD 09/10/2006  . ROSACEA 09/10/2006  . Degeneration of lumbar or lumbosacral intervertebral disc 09/10/2006  . SLEEP APNEA 09/10/2006  . COLONIC POLYPS, HX OF 09/10/2006  . Diabetes type 2, controlled ([place]) 09/06/2006   Past Medical History:  Diagnosis Date  . Adenomatous colon polyp 2003  . Benign neoplasm of colon   . Carpal tunnel syndrome   . Chest pain, unspecified   . CHF (congestive heart failure) (HCC)    diastolic  .  Degeneration of intervertebral disc, site unspecified   . Diabetes mellitus    type II  . Encounter for long-term (current) use of other medications   . Fluid overload   . GERD (gastroesophageal reflux disease)   . Heart disease, unspecified   . Hyperlipidemia   . Hypertension   . Kidney stone 3/12   hosp/ with stent  . Obesity, unspecified   . Rosacea   . Shortness of breath   . Unspecified asthma(493.90)   . Unspecified sleep apnea    Past Surgical History:  Procedure Laterality Date  . ABDOMINAL HYSTERECTOMY     partial, fibroids  . ESOPHAGOGASTRODUODENOSCOPY  2000  . UMBILICAL HERNIA REPAIR  01/2004  . URETERAL STENT PLACEMENT  2/13   for L sided stone/ ecoli sepsis   Social History   Tobacco Use  . Smoking status: Never Smoker  . Smokeless tobacco: Never Used  Substance Use Topics  . Alcohol use: No    Alcohol/week: 0.0 oz  . Drug use: No   History reviewed. No pertinent family history. Allergies  Allergen Reactions  . Rofecoxib     REACTION: reaction not known  . Tetanus-Diphtheria Toxoids Td     REACTION: sick with fever  . Antihistamines, Chlorpheniramine-Type Palpitations  . Sulfa Antibiotics Rash   Current Outpatient Medications on File Prior to Visit  Medication Sig Dispense Refill  . Ascorbic Acid (VITAMIN C) 500 MG tablet Take 500 mg by mouth daily.      . Aspirin-Caffeine (BC FAST PAIN RELIEF PO) Take by mouth as directed.      Marland Kitchen CALCIUM-VITAMIN D PO Take 1 tablet by mouth 2 (two) times daily.     . furosemide (LASIX) 40 MG tablet TAKE ONE-HALF TABLET BY  MOUTH DAILY 45 tablet 1  . glucose blood (BAYER CONTOUR TEST) test strip Use to check blood sugar once daily as directed for DM (Dx. E11.9) 100 each 0  . Magnesium 250 MG TABS Take by mouth daily.    Marland Kitchen MICROLET LANCETS MISC Use to check blood sugar once daily as directed for DM (Dx. E11.9) 100 each 0  . Multiple Vitamin (MULTIVITAMIN) capsule Take 1 capsule by mouth daily.      . Omega-3 Fatty  Acids (FISH OIL PO) Take 1 capsule by mouth daily.     . Pseudoephedrine HCl (SINUS & ALLERGY 12 HOUR PO) Take by mouth as directed.      . triamcinolone cream (KENALOG) 0.1 % Apply 1 application topically 2 (  two) times daily. Apply to affected areas twice daily. (Patient taking differently: Apply 1 application topically as needed. Apply to affected areas twice daily.) 30 g 0  . vitamin E 400 UNIT capsule Take 400 Units by mouth daily.      . traMADol (ULTRAM) 50 MG tablet Take 1 tablet (50 mg total) by mouth every 8 (eight) hours as needed. (Patient not taking: Reported on 04/30/2017) 30 tablet 0   No current facility-administered medications on file prior to visit.     Review of Systems  Constitutional: Negative for activity change, appetite change, fatigue, fever and unexpected weight change.  HENT: Negative for congestion, ear pain, rhinorrhea, sinus pressure and sore throat.   Eyes: Negative for pain, redness and visual disturbance.  Respiratory: Negative for cough, shortness of breath and wheezing.   Cardiovascular: Negative for chest pain and palpitations.  Gastrointestinal: Negative for abdominal pain, blood in stool, constipation and diarrhea.  Endocrine: Negative for polydipsia and polyuria.  Genitourinary: Negative for dysuria, frequency and urgency.  Musculoskeletal: Positive for arthralgias. Negative for back pain and myalgias.  Skin: Negative for pallor and rash.  Allergic/Immunologic: Negative for environmental allergies.  Neurological: Negative for dizziness, syncope and headaches.  Hematological: Negative for adenopathy. Does not bruise/bleed easily.  Psychiatric/Behavioral: Negative for decreased concentration and dysphoric mood. The patient is not nervous/anxious.        Objective:   Physical Exam  Constitutional: She appears well-developed and well-nourished. No distress.  obese and well appearing   HENT:  Head: Normocephalic and atraumatic.  Right Ear: External ear  normal.  Left Ear: External ear normal.  Nose: Nose normal.  Mouth/Throat: Oropharynx is clear and moist.  Eyes: Conjunctivae and EOM are normal. Pupils are equal, round, and reactive to light. Right eye exhibits no discharge. Left eye exhibits no discharge. No scleral icterus.  Neck: Normal range of motion. Neck supple. No JVD present. Carotid bruit is not present. No thyromegaly present.  Cardiovascular: Normal rate, regular rhythm, normal heart sounds and intact distal pulses. Exam reveals no gallop.  Pulmonary/Chest: Effort normal and breath sounds normal. No respiratory distress. She has no wheezes. She has no rales.  Abdominal: Soft. Bowel sounds are normal. She exhibits no distension and no mass. There is no tenderness.  Genitourinary:  Genitourinary Comments: Declines breast exam   Musculoskeletal: She exhibits no edema or tenderness.  Lymphadenopathy:    She has no cervical adenopathy.  Neurological: She is alert. She has normal reflexes. No cranial nerve deficit. She exhibits normal muscle tone. Coordination normal.  Skin: Skin is warm and dry. No rash noted. No erythema. No pallor.  Solar lentigines diffusely  Scattered SKs   Psychiatric: She has a normal mood and affect.          Assessment & Plan:   Problem List Items Addressed This Visit      Cardiovascular and Mediastinum   DIASTOLIC HEART FAILURE, CHRONIC    No symptoms currently      Relevant Medications   simvastatin (ZOCOR) 40 MG tablet   benazepril (LOTENSIN) 20 MG tablet   Essential hypertension    bp in fair control at this time  BP Readings from Last 1 Encounters:  04/30/17 130/80   No changes needed Disc lifstyle change with low sodium diet and exercise  Labs reviewed  Wt loss enc       Relevant Medications   simvastatin (ZOCOR) 40 MG tablet   benazepril (LOTENSIN) 20 MG tablet     Endocrine  Diabetes type 2, controlled ([place])    Lab Results  Component Value Date   HGBA1C 7.2 (H)  04/23/2017   This is up  Pt states she is eating sweets/junk food and plans to stop now Disc exercise  Also foot and eye care  F/u 3 mo with lab prior  Continue glipizide and metformin      Relevant Medications   simvastatin (ZOCOR) 40 MG tablet   metFORMIN (GLUCOPHAGE) 1000 MG tablet   glipiZIDE (GLUCOTROL XL) 5 MG 24 hr tablet   benazepril (LOTENSIN) 20 MG tablet     Other   COLONIC POLYPS, HX OF    Due for colonoscopy in may  Will call to schedule  She may want to req a certain provider since dr Olevia Perches retired       History of shingles    Enc her to get on a wait list for the shingrix vaccine at pharmacy       HYPERCHOLESTEROLEMIA, PURE    Disc goals for lipids and reasons to control them Rev labs with pt Rev low sat fat diet in detail Well controlled with zocor and diet       Relevant Medications   simvastatin (ZOCOR) 40 MG tablet   benazepril (LOTENSIN) 20 MG tablet   Mild anemia    Intermittent  Hb of 11 Has had before  Not microcytic No symptoms  Has colonoscopy planned for may       Morbid obesity ([place])    Discussed how this problem influences overall health and the risks it imposes  Reviewed plan for weight loss with lower calorie diet (via better food choices and also portion control or program like weight watchers) and exercise building up to or more than 30 minutes 5 days per week including some aerobic activity   Made plan to cut processed carbs Also stay active as tolerated       Relevant Medications   metFORMIN (GLUCOPHAGE) 1000 MG tablet   glipiZIDE (GLUCOTROL XL) 5 MG 24 hr tablet   Routine general medical examination at a health care facility - Primary    Reviewed health habits including diet and exercise and skin cancer prevention Reviewed appropriate screening tests for age  Also reviewed health mt list, fam hx and immunization status , as well as social and family history   See HPI Labs reviewed  Enc wt loss  amw rev  Declines  breast cancer screening  Will get colonoscopy in may for 3 y recall  Will watch cbc - Hb down slt again - ? Reason  Urged to get on wt list for shingrix

## 2017-04-30 NOTE — Assessment & Plan Note (Signed)
Intermittent  Hb of 11 Has had before  Not microcytic No symptoms  Has colonoscopy planned for may

## 2017-07-04 ENCOUNTER — Encounter: Payer: Self-pay | Admitting: Internal Medicine

## 2017-07-18 ENCOUNTER — Telehealth: Payer: Self-pay | Admitting: Family Medicine

## 2017-07-18 DIAGNOSIS — E119 Type 2 diabetes mellitus without complications: Secondary | ICD-10-CM

## 2017-07-18 DIAGNOSIS — D649 Anemia, unspecified: Secondary | ICD-10-CM

## 2017-07-18 NOTE — Telephone Encounter (Signed)
-----   Message from Lendon Collar, RT sent at 07/17/2017  2:12 PM EDT ----- Regarding: Lab orders for May 2nd Please enter lab orders for Thursday May 2nd. Thanks-Lauren

## 2017-07-26 ENCOUNTER — Other Ambulatory Visit: Payer: Medicare Other

## 2017-07-27 ENCOUNTER — Other Ambulatory Visit (INDEPENDENT_AMBULATORY_CARE_PROVIDER_SITE_OTHER): Payer: Medicare Other

## 2017-07-27 DIAGNOSIS — E119 Type 2 diabetes mellitus without complications: Secondary | ICD-10-CM | POA: Diagnosis not present

## 2017-07-27 DIAGNOSIS — D649 Anemia, unspecified: Secondary | ICD-10-CM | POA: Diagnosis not present

## 2017-07-27 LAB — CBC WITH DIFFERENTIAL/PLATELET
Basophils Absolute: 0.1 10*3/uL (ref 0.0–0.1)
Basophils Relative: 0.9 % (ref 0.0–3.0)
Eosinophils Absolute: 0.3 10*3/uL (ref 0.0–0.7)
Eosinophils Relative: 3.7 % (ref 0.0–5.0)
HCT: 34.8 % — ABNORMAL LOW (ref 36.0–46.0)
HEMOGLOBIN: 11.2 g/dL — AB (ref 12.0–15.0)
LYMPHS ABS: 2.3 10*3/uL (ref 0.7–4.0)
Lymphocytes Relative: 30.5 % (ref 12.0–46.0)
MCHC: 32 g/dL (ref 30.0–36.0)
MCV: 76 fl — ABNORMAL LOW (ref 78.0–100.0)
MONO ABS: 0.4 10*3/uL (ref 0.1–1.0)
MONOS PCT: 5.2 % (ref 3.0–12.0)
NEUTROS PCT: 59.7 % (ref 43.0–77.0)
Neutro Abs: 4.6 10*3/uL (ref 1.4–7.7)
Platelets: 244 10*3/uL (ref 150.0–400.0)
RBC: 4.58 Mil/uL (ref 3.87–5.11)
RDW: 16.9 % — ABNORMAL HIGH (ref 11.5–15.5)
WBC: 7.7 10*3/uL (ref 4.0–10.5)

## 2017-07-27 LAB — HEMOGLOBIN A1C: HEMOGLOBIN A1C: 7.1 % — AB (ref 4.6–6.5)

## 2017-07-28 LAB — FERRITIN: FERRITIN: 3.6 ng/mL — AB (ref 10.0–291.0)

## 2017-07-31 ENCOUNTER — Encounter: Payer: Self-pay | Admitting: Family Medicine

## 2017-07-31 ENCOUNTER — Ambulatory Visit (INDEPENDENT_AMBULATORY_CARE_PROVIDER_SITE_OTHER): Payer: Medicare Other | Admitting: Family Medicine

## 2017-07-31 VITALS — BP 125/70 | HR 88 | Temp 98.0°F | Ht 61.0 in | Wt 215.0 lb

## 2017-07-31 DIAGNOSIS — E119 Type 2 diabetes mellitus without complications: Secondary | ICD-10-CM | POA: Diagnosis not present

## 2017-07-31 DIAGNOSIS — G4733 Obstructive sleep apnea (adult) (pediatric): Secondary | ICD-10-CM

## 2017-07-31 DIAGNOSIS — D649 Anemia, unspecified: Secondary | ICD-10-CM | POA: Diagnosis not present

## 2017-07-31 DIAGNOSIS — R400 Somnolence: Secondary | ICD-10-CM | POA: Insufficient documentation

## 2017-07-31 DIAGNOSIS — I10 Essential (primary) hypertension: Secondary | ICD-10-CM

## 2017-07-31 NOTE — Progress Notes (Signed)
Subjective:    Patient ID: Gabriella Howe, female    DOB: 1935/02/26, 83 y.o.   MRN: [id number]  HPI Here for f/u of chronic health problems   Has not felt great in general  She had a uri with cough- hard time getting over it and just very tired  Cough slowed way down/not prod  ? Post viral syndrome   She has osa - had cpap "got mad at those people and then took it back"- had a lot of problems with it  She sleeps in a recliner  This was set up through [employer] Readings from Last 3 Encounters:  07/31/17 215 lb (97.5 kg)  04/30/17 211 lb (95.7 kg)  04/23/17 210 lb (95.3 kg)  she started doing some exercise before she got sick - (started walking on the treadmill)  Then she got sick and then very tired  Weight is up  40.62 kg/m   bp is stable today  BP: 125/70  (better on this 2nd check) No cp or palpitations or headaches or edema  No side effects to medicines  BP Readings from Last 3 Encounters:  07/31/17 140/80  04/30/17 130/80  04/23/17 118/78     DM2 Lab Results  Component Value Date   HGBA1C 7.1 (H) 07/27/2017  has not made an effort to change diet /her appetite is off as well  Tries not to skip meals  Fatigue makes her eat poorly / no motivation to prep healthy food  Does not want to inc her glipizide  This is down from 7.2 Eye exam -has an appt in June /had to get a new eye doctor   On metformin and glipizide   Mild anemia  Lab Results  Component Value Date   WBC 7.7 07/27/2017   HGB 11.2 (L) 07/27/2017   HCT 34.8 (L) 07/27/2017   MCV 76.0 (L) 07/27/2017   PLT 244.0 07/27/2017   Lab Results  Component Value Date   FERRITIN 3.6 (L) 07/27/2017   Colonoscopy (she got a letter last week to call for appt)   She started otc tumeric - unsure if it is doing anything   Patient Active Problem List   Diagnosis Date Noted  . Somnolence, daytime 07/31/2017  . Mild anemia 04/30/2017  . History of shingles 10/03/2016  . Routine general medical  examination at a health care facility 04/14/2015  . Back pain 07/28/2014  . Knee pain 07/28/2014  . Encounter for Medicare annual wellness exam 11/19/2012  . History of kidney stones 11/19/2012  . Kidney stone 06/07/2011  . DIASTOLIC HEART FAILURE, CHRONIC 07/29/2009  . DYSPNEA 07/23/2009  . HYPERCHOLESTEROLEMIA, PURE 09/13/2006  . FLUID RETENTION 09/10/2006  . Morbid obesity ([place]) 09/10/2006  . CARPAL TUNNEL SYNDROME 09/10/2006  . Essential hypertension 09/10/2006  . DIASTOLIC DYSFUNCTION 25/42/7062  . REACTIVE AIRWAY DISEASE 09/10/2006  . GERD 09/10/2006  . ROSACEA 09/10/2006  . Degeneration of lumbar or lumbosacral intervertebral disc 09/10/2006  . OSA (obstructive sleep apnea) 09/10/2006  . COLONIC POLYPS, HX OF 09/10/2006  . Diabetes type 2, controlled ([place]) 09/06/2006   Past Medical History:  Diagnosis Date  . Adenomatous colon polyp 2003  . Benign neoplasm of colon   . Carpal tunnel syndrome   . Chest pain, unspecified   . CHF (congestive heart failure) ([facility])    diastolic  . Degeneration of intervertebral disc, site unspecified   . Diabetes mellitus    type II  . Encounter for long-term (current) use  of other medications   . Fluid overload   . GERD (gastroesophageal reflux disease)   . Heart disease, unspecified   . Hyperlipidemia   . Hypertension   . Kidney stone 3/12   hosp/ with stent  . Obesity, unspecified   . Rosacea   . Shortness of breath   . Unspecified asthma(493.90)   . Unspecified sleep apnea    Past Surgical History:  Procedure Laterality Date  . ABDOMINAL HYSTERECTOMY     partial, fibroids  . ESOPHAGOGASTRODUODENOSCOPY  2000  . UMBILICAL HERNIA REPAIR  01/2004  . URETERAL STENT PLACEMENT  2/13   for L sided stone/ ecoli sepsis   Social History   Tobacco Use  . Smoking status: Never Smoker  . Smokeless tobacco: Never Used  Substance Use Topics  . Alcohol use: No    Alcohol/week: 0.0 oz  . Drug use: No   No family history on  file. Allergies  Allergen Reactions  . Rofecoxib     REACTION: reaction not known  . Tetanus-Diphtheria Toxoids Td     REACTION: sick with fever  . Antihistamines, Chlorpheniramine-Type Palpitations  . Sulfa Antibiotics Rash   Current Outpatient Medications on File Prior to Visit  Medication Sig Dispense Refill  . Ascorbic Acid (VITAMIN C) 500 MG tablet Take 500 mg by mouth daily.      . Aspirin-Caffeine (BC FAST PAIN RELIEF PO) Take by mouth as directed.      . benazepril (LOTENSIN) 20 MG tablet Take 1 tablet (20 mg total) by mouth daily. 90 tablet 3  . CALCIUM-VITAMIN D PO Take 1 tablet by mouth 2 (two) times daily.     . furosemide (LASIX) 40 MG tablet TAKE ONE-HALF TABLET BY  MOUTH DAILY 45 tablet 1  . glipiZIDE (GLUCOTROL XL) 5 MG 24 hr tablet Take 1 tablet (5 mg total) by mouth daily with breakfast. 90 tablet 3  . glucose blood (BAYER CONTOUR TEST) test strip Use to check blood sugar once daily as directed for DM (Dx. E11.9) 100 each 0  . Magnesium 250 MG TABS Take by mouth daily.    . metFORMIN (GLUCOPHAGE) 1000 MG tablet TAKE 1 TABLET BY MOUTH TWO  TIMES DAILY WITH A MEAL 180 tablet 3  . MICROLET LANCETS MISC Use to check blood sugar once daily as directed for DM (Dx. E11.9) 100 each 0  . Multiple Vitamin (MULTIVITAMIN) capsule Take 1 capsule by mouth daily.      . Omega-3 Fatty Acids (FISH OIL PO) Take 1 capsule by mouth daily.     Marland Kitchen omeprazole (PRILOSEC) 20 MG capsule Take 1 capsule (20 mg total) by mouth daily. 90 capsule 3  . Pseudoephedrine HCl (SINUS & ALLERGY 12 HOUR PO) Take by mouth as directed.      . simvastatin (ZOCOR) 40 MG tablet Take 1 tablet (40 mg total) by mouth at bedtime. 90 tablet 3  . traMADol (ULTRAM) 50 MG tablet Take 1 tablet (50 mg total) by mouth every 8 (eight) hours as needed. 30 tablet 0  . triamcinolone cream (KENALOG) 0.1 % Apply 1 application topically 2 (two) times daily. Apply to affected areas twice daily. (Patient taking differently: Apply 1  application topically as needed. Apply to affected areas twice daily.) 30 g 0  . vitamin E 400 UNIT capsule Take 400 Units by mouth daily.       No current facility-administered medications on file prior to visit.     Review of Systems  Constitutional: Positive for  fatigue. Negative for activity change, appetite change, fever and unexpected weight change.       Day time sleepiness   Sleeps in a recliner  HENT: Negative for congestion, ear pain, rhinorrhea, sinus pressure and sore throat.   Eyes: Negative for pain, redness and visual disturbance.  Respiratory: Negative for cough, shortness of breath and wheezing.   Cardiovascular: Negative for chest pain and palpitations.  Gastrointestinal: Negative for abdominal pain, blood in stool, constipation and diarrhea.  Endocrine: Negative for polydipsia and polyuria.  Genitourinary: Negative for dysuria, frequency and urgency.  Musculoskeletal: Negative for arthralgias, back pain and myalgias.  Skin: Negative for pallor and rash.  Allergic/Immunologic: Negative for environmental allergies.  Neurological: Negative for dizziness, syncope and headaches.  Hematological: Negative for adenopathy. Does not bruise/bleed easily.  Psychiatric/Behavioral: Negative for decreased concentration and dysphoric mood. The patient is not nervous/anxious.        Low motivation        Objective:   Physical Exam  Constitutional: She appears well-developed and well-nourished. No distress.  obese and well appearing   HENT:  Head: Normocephalic and atraumatic.  Mouth/Throat: Oropharynx is clear and moist.  Eyes: Pupils are equal, round, and reactive to light. Conjunctivae and EOM are normal.  Neck: Normal range of motion. Neck supple. No JVD present. Carotid bruit is not present. No thyromegaly present.  Cardiovascular: Normal rate, regular rhythm, normal heart sounds and intact distal pulses. Exam reveals no gallop.  Pulmonary/Chest: Effort normal and breath  sounds normal. No respiratory distress. She has no wheezes. She has no rales.  No crackles  Abdominal: Soft. Bowel sounds are normal. She exhibits no distension, no abdominal bruit and no mass. There is no tenderness.  Musculoskeletal: She exhibits no edema.  Lymphadenopathy:    She has no cervical adenopathy.  Neurological: She is alert. She has normal reflexes. She displays normal reflexes. Coordination normal.  Skin: Skin is warm and dry. No rash noted.  Psychiatric: Her speech is normal and behavior is normal. Thought content normal. Her affect is not blunt, not labile and not inappropriate. She exhibits a depressed mood.  Seems fatigue  Somewhat depressed affect           Assessment & Plan:   Problem List Items Addressed This Visit      Cardiovascular and Mediastinum   Essential hypertension    bp in fair control at this time  BP Readings from Last 1 Encounters:  07/31/17 125/70   No changes needed Disc lifstyle change with low sodium diet and exercise  Labs reviewed         Respiratory   OSA (obstructive sleep apnea)    She became frustrated with company that made her cpap years ago and stopped using it  Now having severe day time somnolence  Will refer to sleep clinic for re eval  Needs tx -disc risks  Wt loss enc      Relevant Orders   Ambulatory referral to Pulmonology     Endocrine   Diabetes type 2, controlled ([place]) - Primary    Lab Results  Component Value Date   HGBA1C 7.1 (H) 07/27/2017   Pt is not motivated to watch diet due to mood/malaise/fatigue and poor sleep  Needs more activity  Refuses to inc or change medicines Again rev low glycemic diet  Her eye exam appt is in June         Other   Mild anemia    Hb 11.2  Ferritin is  low  Unsure if iron def  She will make her colonoscopy appt today         Morbid obesity ([place])    Discussed how this problem influences overall health and the risks it imposes  Reviewed plan for weight loss  with lower calorie diet (via better food choices and also portion control or program like weight watchers) and exercise building up to or more than 30 minutes 5 days per week including some aerobic activity   Pt has been too tired to eat well or exercise  Planning re eval for sleep apnea       Somnolence, daytime    Not using cpap for sleep apnea Ref for re eval

## 2017-07-31 NOTE — Patient Instructions (Addendum)
Don't forget to get your eye exam   Any effort (diet/exercise) will help blood sugar  Once you get moving a little bit it will be easier   Your iron stores are low - please call and schedule you appointment with GI to discuss colonoscopy   We will refer you to pulmonary for a sleep apnea visit   Follow up in 3 months

## 2017-08-01 ENCOUNTER — Ambulatory Visit (INDEPENDENT_AMBULATORY_CARE_PROVIDER_SITE_OTHER): Payer: Medicare Other | Admitting: Internal Medicine

## 2017-08-01 ENCOUNTER — Encounter: Payer: Self-pay | Admitting: Internal Medicine

## 2017-08-01 VITALS — BP 132/84 | HR 89 | Resp 16 | Ht 61.0 in | Wt 215.0 lb

## 2017-08-01 DIAGNOSIS — G4719 Other hypersomnia: Secondary | ICD-10-CM | POA: Diagnosis not present

## 2017-08-01 DIAGNOSIS — G4733 Obstructive sleep apnea (adult) (pediatric): Secondary | ICD-10-CM

## 2017-08-01 NOTE — Assessment & Plan Note (Signed)
Discussed how this problem influences overall health and the risks it imposes  Reviewed plan for weight loss with lower calorie diet (via better food choices and also portion control or program like weight watchers) and exercise building up to or more than 30 minutes 5 days per week including some aerobic activity   Pt has been too tired to eat well or exercise  Planning re eval for sleep apnea

## 2017-08-01 NOTE — Assessment & Plan Note (Signed)
Not using cpap for sleep apnea Ref for re eval

## 2017-08-01 NOTE — Assessment & Plan Note (Signed)
Hb 11.2  Ferritin is low  Unsure if iron def  She will make her colonoscopy appt today

## 2017-08-01 NOTE — Assessment & Plan Note (Signed)
She became frustrated with company that made her cpap years ago and stopped using it  Now having severe day time somnolence  Will refer to sleep clinic for re eval  Needs tx -disc risks  Wt loss enc

## 2017-08-01 NOTE — Assessment & Plan Note (Signed)
Lab Results  Component Value Date   HGBA1C 7.1 (H) 07/27/2017   Pt is not motivated to watch diet due to mood/malaise/fatigue and poor sleep  Needs more activity  Refuses to inc or change medicines Again rev low glycemic diet  Her eye exam appt is in June

## 2017-08-01 NOTE — Assessment & Plan Note (Signed)
bp in fair control at this time  BP Readings from Last 1 Encounters:  07/31/17 125/70   No changes needed Disc lifstyle change with low sodium diet and exercise  Labs reviewed

## 2017-08-01 NOTE — Progress Notes (Signed)
[facility] Consultation      Assessment and Plan:  Obstructive sleep apnea, excessive daytime sleepiness.   -History of sleep apnea approximate 10 years ago, return the machine as she did not like it. - Now has continued and progressive symptoms of excessive daytime sleepiness, will send for sleep study.  Diabtes mellitus, GERD, essential hypertension.  - Obstructive sleep apnea can contribute and worsen above problems, therefore treatment of the sleep apnea is important part of their management.   Date: 08/01/2017  MRN# [id number] Gabriella Howe [birth year]    Gabriella Howe is a 83 y.o. old female seen in consultation for chief complaint of:    Chief Complaint  Patient presents with  . Consult    Referred by Dr. Glori Bickers for eval of OSA  . excessive daytime sleepiness    fatigue    HPI:  The patient (Gabriella Howe) is an 83 yo female referred for  Daytime sleepiness. She is tired all the times and she will easily fall asleep at rest during the day.Her epworth is 18. Goes to bed around 1am, she will be at a recliner chair watching tv,, she sleeps in the recliner. She falls asleep quickly after tuning out the lights. Wakes at 630, feels tired at that time.  She has been tested for OSA about 10 years ago. She got a CPAP but she had issues with the machine and gave it back.      PMHX:   Past Medical History:  Diagnosis Date  . Adenomatous colon polyp 2003  . Benign neoplasm of colon   . Carpal tunnel syndrome   . Chest pain, unspecified   . CHF (congestive heart failure) (HCC)    diastolic  . Degeneration of intervertebral disc, site unspecified   . Diabetes mellitus    type II  . Encounter for long-term (current) use of other medications   . Fluid overload   . GERD (gastroesophageal reflux disease)   . Heart disease, unspecified   . Hyperlipidemia   . Hypertension   . Kidney stone 3/12   hosp/ with stent  . Obesity, unspecified   . Rosacea   .  Shortness of breath   . Unspecified asthma(493.90)   . Unspecified sleep apnea    Surgical Hx:  Past Surgical History:  Procedure Laterality Date  . ABDOMINAL HYSTERECTOMY     partial, fibroids  . ESOPHAGOGASTRODUODENOSCOPY  2000  . UMBILICAL HERNIA REPAIR  01/2004  . URETERAL STENT PLACEMENT  2/13   for L sided stone/ ecoli sepsis   Family Hx:  History reviewed. No pertinent family history. Social Hx:   Social History   Tobacco Use  . Smoking status: Never Smoker  . Smokeless tobacco: Never Used  Substance Use Topics  . Alcohol use: No    Alcohol/week: 0.0 oz  . Drug use: No   Medication:    Current Outpatient Medications:  .  Ascorbic Acid (VITAMIN C) 500 MG tablet, Take 500 mg by mouth daily.  , Disp: , Rfl:  .  Aspirin-Caffeine (BC FAST PAIN RELIEF PO), Take by mouth as directed.  , Disp: , Rfl:  .  benazepril (LOTENSIN) 20 MG tablet, Take 1 tablet (20 mg total) by mouth daily., Disp: 90 tablet, Rfl: 3 .  CALCIUM-VITAMIN D PO, Take 1 tablet by mouth 2 (two) times daily. , Disp: , Rfl:  .  furosemide (LASIX) 40 MG tablet, TAKE ONE-HALF TABLET BY  MOUTH DAILY, Disp: 45 tablet, Rfl: 1 .  glipiZIDE (GLUCOTROL XL) 5 MG 24 hr tablet, Take 1 tablet (5 mg total) by mouth daily with breakfast., Disp: 90 tablet, Rfl: 3 .  glucose blood (BAYER CONTOUR TEST) test strip, Use to check blood sugar once daily as directed for DM (Dx. E11.9), Disp: 100 each, Rfl: 0 .  Magnesium 250 MG TABS, Take by mouth daily., Disp: , Rfl:  .  metFORMIN (GLUCOPHAGE) 1000 MG tablet, TAKE 1 TABLET BY MOUTH TWO  TIMES DAILY WITH A MEAL, Disp: 180 tablet, Rfl: 3 .  MICROLET LANCETS MISC, Use to check blood sugar once daily as directed for DM (Dx. E11.9), Disp: 100 each, Rfl: 0 .  Multiple Vitamin (MULTIVITAMIN) capsule, Take 1 capsule by mouth daily.  , Disp: , Rfl:  .  Omega-3 Fatty Acids (FISH OIL PO), Take 1 capsule by mouth daily. , Disp: , Rfl:  .  omeprazole (PRILOSEC) 20 MG capsule, Take 1 capsule  (20 mg total) by mouth daily., Disp: 90 capsule, Rfl: 3 .  Pseudoephedrine HCl (SINUS & ALLERGY 12 HOUR PO), Take by mouth as directed.  , Disp: , Rfl:  .  simvastatin (ZOCOR) 40 MG tablet, Take 1 tablet (40 mg total) by mouth at bedtime., Disp: 90 tablet, Rfl: 3 .  traMADol (ULTRAM) 50 MG tablet, Take 1 tablet (50 mg total) by mouth every 8 (eight) hours as needed., Disp: 30 tablet, Rfl: 0 .  triamcinolone cream (KENALOG) 0.1 %, Apply 1 application topically 2 (two) times daily. Apply to affected areas twice daily. (Patient taking differently: Apply 1 application topically as needed. Apply to affected areas twice daily.), Disp: 30 g, Rfl: 0 .  vitamin E 400 UNIT capsule, Take 400 Units by mouth daily.  , Disp: , Rfl:    Allergies:  Rofecoxib; Tetanus-diphtheria toxoids td; Antihistamines, chlorpheniramine-type; and Sulfa antibiotics  Review of Systems: Gen:  Denies  fever, sweats, chills HEENT: Denies blurred vision, double vision. bleeds, sore throat Cvc:  No dizziness, chest pain. Resp:   Denies cough or sputum production, shortness of breath Gi: Denies swallowing difficulty, stomach pain. Gu:  Denies bladder incontinence, burning urine Ext:   No Joint pain, stiffness. Skin: No skin rash,  hives  Endoc:  No polyuria, polydipsia. Psych: No depression, insomnia. Other:  All other systems were reviewed with the patient and were negative other that what is mentioned in the HPI.   Physical Examination:   VS: BP 132/84 (BP Location: Left Arm, Cuff Size: Large)   Pulse 89   Resp 16   Ht 5\' 1"  (1.549 m)   Wt 215 lb (97.5 kg)   SpO2 97%   BMI 40.62 kg/m   General Appearance: No distress  Neuro:without focal findings,  speech normal,  HEENT: PERRLA, EOM intact.  Mallampati 3 Howe: normal breath sounds, No wheezing.  CardiovascularNormal S1,S2.  No m/r/g.   Abdomen: Benign, Soft, non-tender. Renal:  No costovertebral tenderness  GU:  No performed at this time. Endoc: No evident  thyromegaly, no signs of acromegaly. Skin:   warm, no rashes, no ecchymosis  Extremities: normal, no cyanosis, clubbing.  Other findings:    LABORATORY PANEL:   CBC Recent Labs  Lab 07/27/17 1436  WBC 7.7  HGB 11.2*  HCT 34.8*  PLT 244.0   ------------------------------------------------------------------------------------------------------------------  Chemistries  No results for input(s): NA, K, CL, CO2, GLUCOSE, BUN, CREATININE, CALCIUM, MG, AST, ALT, ALKPHOS, BILITOT in the last 168 hours.  Invalid input(s): GFRCGP ------------------------------------------------------------------------------------------------------------------  Cardiac Enzymes No results for input(s): TROPONINI in  the last 168 hours. ------------------------------------------------------------  RADIOLOGY:  No results found.     Thank  you for the consultation and for allowing [facility] to assist in the care of your patient. Our recommendations are noted above.  Please contact us if we can be of further service.   Marda Stalker, MD.  Board Certified in Internal Medicine, [facility], [place], and Sleep Medicine.  [facility] Office Number: [phone]  Patricia Pesa, M.D.  Merton Border, M.D  08/01/2017

## 2017-08-01 NOTE — Patient Instructions (Signed)
You should try to get try to go to sleep by 1130 every night.  Will send you for a sleep study.

## 2017-08-16 ENCOUNTER — Encounter: Payer: Self-pay | Admitting: Physician Assistant

## 2017-08-16 ENCOUNTER — Ambulatory Visit: Payer: Medicare Other | Attending: Internal Medicine

## 2017-08-16 ENCOUNTER — Ambulatory Visit: Payer: Medicare Other | Admitting: Physician Assistant

## 2017-08-16 VITALS — BP 140/80 | HR 80 | Ht 60.63 in | Wt 211.5 lb

## 2017-08-16 DIAGNOSIS — D509 Iron deficiency anemia, unspecified: Secondary | ICD-10-CM

## 2017-08-16 DIAGNOSIS — Z86010 Personal history of colonic polyps: Secondary | ICD-10-CM | POA: Diagnosis not present

## 2017-08-16 DIAGNOSIS — G4761 Periodic limb movement disorder: Secondary | ICD-10-CM | POA: Insufficient documentation

## 2017-08-16 DIAGNOSIS — R0683 Snoring: Secondary | ICD-10-CM | POA: Diagnosis not present

## 2017-08-16 DIAGNOSIS — K219 Gastro-esophageal reflux disease without esophagitis: Secondary | ICD-10-CM

## 2017-08-16 MED ORDER — NA SULFATE-K SULFATE-MG SULF 17.5-3.13-1.6 GM/177ML PO SOLN: 1.0000 | ORAL | 0 refills | Status: DC

## 2017-08-16 NOTE — Progress Notes (Signed)
Chief Complaint: IDA, history of colon polyps  HPI:    Gabriella Howe is an 83 year old Caucasian female with a past medical history as listed below, who previously followed with Dr. Olevia Perches and who was referred to me by Abner Greenspan, MD for a complaint of IDA and history of colon polyps.      08/08/14 colonoscopy Dr. Maurene Capes with 2 sessile polyps, one in the descending colon x15 mm one in the transverse colon x9 mm.  Pathology revealed tubular adenomas and patient was told to repeat in 3 years.    07/27/2017 CBC with a hemoglobin of 11.2 (previously 11 on 04/23/2017), MCV low at 76, otherwise normal CBC.  Ferritin also low at 3.6.    Today, patient explains that her brother who is 77 years younger than her, was recently diagnosed with colon cancer and had to undergo colon resection.  She herself was recently diagnosed with iron deficiency anemia and told to follow with our clinic in regards to this.  She denies any acute GI complaints.    Chronic reflux controlled on Omeprazole 20 mg daily.    Denies fever, chills, blood in her stool, melena, weight loss, anorexia, nausea, vomiting or heartburn.  Past Medical History:  Diagnosis Date  . Adenomatous colon polyp 2003  . Benign neoplasm of colon   . Carpal tunnel syndrome   . Chest pain, unspecified   . CHF (congestive heart failure) (HCC)    diastolic  . Degeneration of intervertebral disc, site unspecified   . Diabetes mellitus    type II  . Encounter for long-term (current) use of other medications   . Fluid overload   . GERD (gastroesophageal reflux disease)   . Heart disease, unspecified   . Hyperlipidemia   . Hypertension   . Kidney stone 3/12   hosp/ with stent  . Obesity, unspecified   . Rosacea   . Shortness of breath   . Unspecified asthma(493.90)   . Unspecified sleep apnea     Past Surgical History:  Procedure Laterality Date  . ABDOMINAL HYSTERECTOMY     partial, fibroids  . ESOPHAGOGASTRODUODENOSCOPY  2000  .  UMBILICAL HERNIA REPAIR  01/2004  . URETERAL STENT PLACEMENT  2/13   for L sided stone/ ecoli sepsis    Current Outpatient Medications  Medication Sig Dispense Refill  . Ascorbic Acid (VITAMIN C) 500 MG tablet Take 500 mg by mouth daily.      . Aspirin-Caffeine (BC FAST PAIN RELIEF PO) Take by mouth as directed.      . benazepril (LOTENSIN) 20 MG tablet Take 1 tablet (20 mg total) by mouth daily. 90 tablet 3  . CALCIUM-VITAMIN D PO Take 1 tablet by mouth 2 (two) times daily.     . furosemide (LASIX) 40 MG tablet TAKE ONE-HALF TABLET BY  MOUTH DAILY 45 tablet 1  . glipiZIDE (GLUCOTROL XL) 5 MG 24 hr tablet Take 1 tablet (5 mg total) by mouth daily with breakfast. 90 tablet 3  . glucose blood (BAYER CONTOUR TEST) test strip Use to check blood sugar once daily as directed for DM (Dx. E11.9) 100 each 0  . Magnesium 250 MG TABS Take by mouth daily.    . metFORMIN (GLUCOPHAGE) 1000 MG tablet TAKE 1 TABLET BY MOUTH TWO  TIMES DAILY WITH A MEAL 180 tablet 3  . MICROLET LANCETS MISC Use to check blood sugar once daily as directed for DM (Dx. E11.9) 100 each 0  . Multiple Vitamin (MULTIVITAMIN) capsule Take  1 capsule by mouth daily.      . Omega-3 Fatty Acids (FISH OIL PO) Take 1 capsule by mouth daily.     Marland Kitchen omeprazole (PRILOSEC) 20 MG capsule Take 1 capsule (20 mg total) by mouth daily. 90 capsule 3  . Pseudoephedrine HCl (SINUS & ALLERGY 12 HOUR PO) Take by mouth as directed.      . simvastatin (ZOCOR) 40 MG tablet Take 1 tablet (40 mg total) by mouth at bedtime. 90 tablet 3  . traMADol (ULTRAM) 50 MG tablet Take 1 tablet (50 mg total) by mouth every 8 (eight) hours as needed. 30 tablet 0  . triamcinolone cream (KENALOG) 0.1 % Apply 1 application topically 2 (two) times daily. Apply to affected areas twice daily. (Patient taking differently: Apply 1 application topically as needed. Apply to affected areas twice daily.) 30 g 0  . vitamin E 400 UNIT capsule Take 400 Units by mouth daily.       No  current facility-administered medications for this visit.     Allergies as of 08/16/2017 - Review Complete 08/16/2017  Allergen Reaction Noted  . Rofecoxib  09/10/2006  . Tetanus-diphtheria toxoids td  05/10/2009  . Antihistamines, chlorpheniramine-type Palpitations 11/17/2011  . Sulfa antibiotics Rash 11/17/2011    Family History  Problem Relation Age of Onset  . Early death Father 52       train accident  . Diabetes Mother   . Heart disease Mother   . Diabetes Sister   . Diabetes Brother   . Colon cancer Brother   . Cancer Paternal Grandmother        unknown  . Diabetes Sister     Social History   Socioeconomic History  . Marital status: Widowed    Spouse name: Not on file  . Number of children: Not on file  . Years of education: Not on file  . Highest education level: Not on file  Occupational History  . Not on file  Social Needs  . Financial resource strain: Not on file  . Food insecurity:    Worry: Not on file    Inability: Not on file  . Transportation needs:    Medical: Not on file    Non-medical: Not on file  Tobacco Use  . Smoking status: Never Smoker  . Smokeless tobacco: Never Used  Substance and Sexual Activity  . Alcohol use: No    Alcohol/week: 0.0 oz  . Drug use: No  . Sexual activity: Never  Lifestyle  . Physical activity:    Days per week: Not on file    Minutes per session: Not on file  . Stress: Not on file  Relationships  . Social connections:    Talks on phone: Not on file    Gets together: Not on file    Attends religious service: Not on file    Active member of club or organization: Not on file    Attends meetings of clubs or organizations: Not on file    Relationship status: Not on file  . Intimate partner violence:    Fear of current or ex partner: Not on file    Emotionally abused: Not on file    Physically abused: Not on file    Forced sexual activity: Not on file  Other Topics Concern  . Not on file  Social History  Narrative  . Not on file    Review of Systems:    Constitutional: No weight loss, fever or chills Skin: No itching  Cardiovascular: No chest pai Respiratory: No SOB Gastrointestinal: See HPI and otherwise negative Genitourinary: No dysuria  Neurological: No headache Musculoskeletal: No new muscle or joint pain Hematologic: No bleeding  Psychiatric: No history of depression or anxiety   Physical Exam:  Vital signs: BP 140/80 (BP Location: Left Arm, Patient Position: Sitting, Cuff Size: Normal)   Pulse 80   Ht 5' 0.63" (1.54 m) Comment: height measured without shoes  Wt 211 lb 8 oz (95.9 kg)   BMI 40.45 kg/m   Constitutional:   Pleasant overweight elderly Caucasian female appears to be in NAD, Well developed, Well nourished, alert and cooperative Head:  Normocephalic and atraumatic. Eyes:   PEERL, EOMI. No icterus. Conjunctiva pink. Ears:  Normal auditory acuity. Neck:  Supple Throat: Oral cavity and pharynx without inflammation, swelling or lesion.  Respiratory: Respirations even and unlabored. Lungs clear to auscultation bilaterally.   No wheezes, crackles, or rhonchi.  Cardiovascular: Normal S1, S2. No MRG. Regular rate and rhythm. No peripheral edema, cyanosis or pallor.  Gastrointestinal:  Soft, nondistended, nontender. No rebound or guarding. Normal bowel sounds. No appreciable masses or hepatomegaly. Rectal:  Not performed.  Msk:  Symmetrical without gross deformities. Without edema, no deformity or joint abnormality. Ambulates with cane Neurologic:  Alert and  oriented x4;  grossly normal neurologically.  Skin:   Dry and intact without significant lesions or rashes. Psychiatric: Demonstrates good judgement and reason without abnormal affect or behaviors.  MOST RECENT LABS AND IMAGING: CBC    Component Value Date/Time   WBC 7.7 07/27/2017 1436   RBC 4.58 07/27/2017 1436   HGB 11.2 (L) 07/27/2017 1436   HGB 12.5 11/15/2012 0853   HCT 34.8 (L) 07/27/2017 1436   HCT  36.2 11/15/2012 0853   PLT 244.0 07/27/2017 1436   PLT 217 11/15/2012 0853   MCV 76.0 (L) 07/27/2017 1436   MCV 89 11/15/2012 0853   MCH 27.6 02/05/2016 1458   MCHC 32.0 07/27/2017 1436   RDW 16.9 (H) 07/27/2017 1436   RDW 13.0 11/15/2012 0853   LYMPHSABS 2.3 07/27/2017 1436   LYMPHSABS 1.5 11/10/2012 0309   MONOABS 0.4 07/27/2017 1436   MONOABS 0.7 11/10/2012 0309   EOSABS 0.3 07/27/2017 1436   EOSABS 0.0 11/10/2012 0309   BASOSABS 0.1 07/27/2017 1436   BASOSABS 0.0 11/10/2012 0309    CMP     Component Value Date/Time   NA 144 04/23/2017 0858   NA 141 11/15/2012 0853   K 4.7 04/23/2017 0858   K 4.4 07/15/2013 1549   CL 107 04/23/2017 0858   CL 109 (H) 11/15/2012 0853   CO2 28 04/23/2017 0858   CO2 24 11/15/2012 0853   GLUCOSE 150 (H) 04/23/2017 0858   GLUCOSE 161 (H) 11/15/2012 0853   BUN 23 04/23/2017 0858   BUN 13 11/15/2012 0853   CREATININE 0.85 04/23/2017 0858   CREATININE 0.92 11/15/2012 0853   CALCIUM 9.7 04/23/2017 0858   CALCIUM 9.5 11/15/2012 0853   PROT 6.9 04/23/2017 0858   PROT 6.5 11/15/2012 0853   ALBUMIN 4.2 04/23/2017 0858   ALBUMIN 3.0 (L) 11/15/2012 0853   AST 15 04/23/2017 0858   AST 17 11/15/2012 0853   ALT 14 04/23/2017 0858   ALT 19 11/15/2012 0853   ALKPHOS 54 04/23/2017 0858   ALKPHOS 94 11/15/2012 0853   BILITOT 0.9 04/23/2017 0858   BILITOT 0.5 11/15/2012 0853   GFRNONAA >60 02/05/2016 1458   GFRNONAA >60 11/15/2012 0853   GFRAA >60 02/05/2016 1458  GFRAA >60 11/15/2012 0853    Assessment: 1.  History of tubular adenomas: Last colonoscopy 2016, recommendation to repeat in 3 years. 2.  IDA: New for the patient over the past 6 months, labs as above; consider GI source of blood loss versus other 3.  GERD: Controlled on Omeprazole 20 mg daily  Plan: 1.  Scheduled for EGD and colonoscopy.  Patient requested Dr. Silverio Decamp today as she is a female physician, patient was accepted by Dr. Silverio Decamp.  Discussed risk, benefits, limitations  and alternatives and patient agrees to proceed. 2.  Continue Omeprazole 20 mg daily, 30-60 minutes for breakfast. 3.  Reviewed antireflux diet and lifestyle modifications. 4.  Patient will follow in clinic per recommendations from Dr. Silverio Decamp after time of procedure.  Gabriella Newer, PA-C [facility] 08/16/2017, 10:32 AM  Cc: Tower, Wynelle Fanny, MD

## 2017-08-16 NOTE — Patient Instructions (Signed)

## 2017-08-17 DIAGNOSIS — R0683 Snoring: Secondary | ICD-10-CM | POA: Diagnosis not present

## 2017-08-21 NOTE — Progress Notes (Signed)
Reviewed and agree with documentation and assessment and plan. K. Veena Shereta Crothers , MD   

## 2017-08-22 ENCOUNTER — Telehealth: Payer: Self-pay | Admitting: *Deleted

## 2017-08-22 NOTE — Telephone Encounter (Signed)
Results of sleep study negative. Spoke with patient's brother Fritz Pickerel who is on [place]. He will give patient results and if she has any questions she may call the office.

## 2017-08-28 DIAGNOSIS — H40003 Preglaucoma, unspecified, bilateral: Secondary | ICD-10-CM | POA: Diagnosis not present

## 2017-08-28 LAB — HM DIABETES EYE EXAM

## 2017-08-29 ENCOUNTER — Encounter: Payer: Self-pay | Admitting: Gastroenterology

## 2017-09-05 ENCOUNTER — Encounter: Payer: Self-pay | Admitting: Gastroenterology

## 2017-09-05 ENCOUNTER — Other Ambulatory Visit: Payer: Self-pay

## 2017-09-05 ENCOUNTER — Ambulatory Visit (AMBULATORY_SURGERY_CENTER): Payer: Medicare Other | Admitting: Gastroenterology

## 2017-09-05 VITALS — BP 166/91 | HR 78 | Temp 97.1°F | Resp 14 | Ht 61.0 in | Wt 211.0 lb

## 2017-09-05 DIAGNOSIS — D123 Benign neoplasm of transverse colon: Secondary | ICD-10-CM

## 2017-09-05 DIAGNOSIS — D509 Iron deficiency anemia, unspecified: Secondary | ICD-10-CM

## 2017-09-05 DIAGNOSIS — Z860100 Personal history of colon polyps, unspecified: Secondary | ICD-10-CM

## 2017-09-05 DIAGNOSIS — K219 Gastro-esophageal reflux disease without esophagitis: Secondary | ICD-10-CM | POA: Diagnosis not present

## 2017-09-05 DIAGNOSIS — E119 Type 2 diabetes mellitus without complications: Secondary | ICD-10-CM | POA: Diagnosis not present

## 2017-09-05 MED ORDER — SODIUM CHLORIDE 0.9 % IV SOLN: 500.0000 mL | Freq: Once | INTRAVENOUS | Status: DC

## 2017-09-05 NOTE — Progress Notes (Signed)
Called to room to assist during endoscopic procedure.  Patient ID and intended procedure confirmed with present staff. Received instructions for my participation in the procedure from the performing physician.  

## 2017-09-05 NOTE — Progress Notes (Signed)
Report to PACU, RN, vss, BBS= Clear.  

## 2017-09-05 NOTE — Progress Notes (Signed)
I have reviewed the patient's medical history in detail and updated the computerized patient record.

## 2017-09-05 NOTE — Op Note (Signed)
[facility] Patient Name: Gabriella Howe Procedure Date: 09/05/2017 2:43 PM MRN: [id number] Endoscopist: Mauri Pole , MD Age: 83 Referring MD:  Date of Birth: 24-Dec-1934 Gender: Female Account #: [id number] Procedure:                Colonoscopy Indications:              Unexplained iron deficiency anemia Medicines:                Monitored Anesthesia Care Procedure:                Pre-Anesthesia Assessment:                           - Prior to the procedure, a History and Physical                            was performed, and patient medications and                            allergies were reviewed. The patient's tolerance of                            previous anesthesia was also reviewed. The risks                            and benefits of the procedure and the sedation                            options and risks were discussed with the patient.                            All questions were answered, and informed consent                            was obtained. Prior Anticoagulants: The patient has                            taken no previous anticoagulant or antiplatelet                            agents. ASA Grade Assessment: II - A patient with                            mild systemic disease. After reviewing the risks                            and benefits, the patient was deemed in                            satisfactory condition to undergo the procedure.                           After obtaining informed consent, the colonoscope  was passed under direct vision. Throughout the                            procedure, the patient's blood pressure, pulse, and                            oxygen saturations were monitored continuously. The                            Model PCF-[id number]) scope was introduced                            through the anus and advanced to the the cecum,                            identified by  appendiceal orifice and ileocecal                            valve. The colonoscopy was performed without                            difficulty. The patient tolerated the procedure                            well. The quality of the bowel preparation was                            excellent. The ileocecal valve, appendiceal                            orifice, and rectum were photographed. Scope In: 2:57:03 PM Scope Out: 3:20:48 PM Scope Withdrawal Time: 0 hours 17 minutes 55 seconds  Total Procedure Duration: 0 hours 23 minutes 45 seconds  Findings:                 The perianal and digital rectal examinations were                            normal.                           A 3 mm polyp was found in the transverse colon. The                            polyp was sessile. The polyp was removed with a                            cold biopsy forceps. Resection and retrieval were                            complete.                           Multiple small and large-mouthed diverticula were  found in the sigmoid colon, descending colon,                            transverse colon and ascending colon.                           Non-bleeding internal hemorrhoids were found during                            retroflexion. The hemorrhoids were small. Complications:            No immediate complications. Estimated Blood Loss:     Estimated blood loss was minimal. Impression:               - One 3 mm polyp in the transverse colon, removed                            with a cold biopsy forceps. Resected and retrieved.                           - Diverticulosis in the sigmoid colon, in the                            descending colon, in the transverse colon and in                            the ascending colon.                           - Non-bleeding internal hemorrhoids. Recommendation:           - Patient has a contact number available for                             emergencies. The signs and symptoms of potential                            delayed complications were discussed with the                            patient. Return to normal activities tomorrow.                            Written discharge instructions were provided to the                            patient.                           - Resume previous diet.                           - Continue present medications.                           - Await pathology results.                           -  No repeat colonoscopy due to age.                           - Return to GI clinic at the next available                            appointment.                           - To visualize the small bowel, perform video                            capsule endoscopy at appointment to be scheduled. Mauri Pole, MD 09/05/2017 3:36:01 PM This report has been signed electronically.

## 2017-09-05 NOTE — Op Note (Signed)
[facility] Patient Name: Gabriella Howe Procedure Date: 09/05/2017 2:43 PM MRN: [id number] Endoscopist: Mauri Pole , MD Age: 83 Referring MD:  Date of Birth: 12-05-1934 Gender: Female Account #: [id number] Procedure:                Upper GI endoscopy Indications:              Suspected upper gastrointestinal bleeding in                            patient with unexplained iron deficiency anemia Medicines:                Monitored Anesthesia Care Procedure:                Pre-Anesthesia Assessment:                           - Prior to the procedure, a History and Physical                            was performed, and patient medications and                            allergies were reviewed. The patient's tolerance of                            previous anesthesia was also reviewed. The risks                            and benefits of the procedure and the sedation                            options and risks were discussed with the patient.                            All questions were answered, and informed consent                            was obtained. ASA Grade Assessment: II - A patient                            with mild systemic disease. After reviewing the                            risks and benefits, the patient was deemed in                            satisfactory condition to undergo the procedure.                           After obtaining informed consent, the endoscope was                            passed under direct vision. Throughout the  procedure, the patient's blood pressure, pulse, and                            oxygen saturations were monitored continuously. The                            Model [id number]-[id number]) scope was introduced                            through the mouth, and advanced to the second part                            of duodenum. The upper GI endoscopy was   accomplished without difficulty. The patient                            tolerated the procedure well. Scope In: Scope Out: Findings:                 The Z-line was regular and was found 35 cm from the                            incisors.                           No endoscopic abnormality was evident in the                            esophagus to explain the patient's complaint of                            dysphagia.                           No gross lesions were noted in the entire examined                            stomach.                           The first portion of the duodenum and second                            portion of the duodenum were normal. Complications:            No immediate complications. Estimated Blood Loss:     Estimated blood loss: none. Impression:               - Z-line regular, 35 cm from the incisors.                           - No endoscopic esophageal abnormality to explain                            patient's dysphagia.                           -  No gross lesions in the stomach.                           - Normal first portion of the duodenum and second                            portion of the duodenum.                           - No specimens collected. Recommendation:           - Patient has a contact number available for                            emergencies. The signs and symptoms of potential                            delayed complications were discussed with the                            patient. Return to normal activities tomorrow.                            Written discharge instructions were provided to the                            patient.                           - Resume previous diet.                           - Continue present medications.                           - See the other procedure note for documentation of                            additional recommendations. Mauri Pole, MD 09/05/2017 3:30:44 PM This  report has been signed electronically.

## 2017-09-05 NOTE — Patient Instructions (Signed)
**  Handouts given on Polyps, diverticulosis and hemorrhoids**   YOU HAD AN ENDOSCOPIC PROCEDURE TODAY: Refer to the procedure report and other information in the discharge instructions given to you for any specific questions about what was found during the examination. If this information does not answer your questions, please call [place] office at [phone] to clarify.   YOU SHOULD EXPECT: Some feelings of bloating in the abdomen. Passage of more gas than usual. Walking can help get rid of the air that was put into your GI tract during the procedure and reduce the bloating. If you had a lower endoscopy (such as a colonoscopy or flexible sigmoidoscopy) you may notice spotting of blood in your stool or on the toilet paper. Some abdominal soreness may be present for a day or two, also.  DIET: Your first meal following the procedure should be a light meal and then it is ok to progress to your normal diet. A half-sandwich or bowl of soup is an example of a good first meal. Heavy or fried foods are harder to digest and may make you feel nauseous or bloated. Drink plenty of fluids but you should avoid alcoholic beverages for 24 hours. If you had a esophageal dilation, please see attached instructions for diet.    ACTIVITY: Your care partner should take you home directly after the procedure. You should plan to take it easy, moving slowly for the rest of the day. You can resume normal activity the day after the procedure however YOU SHOULD NOT DRIVE, use power tools, machinery or perform tasks that involve climbing or major physical exertion for 24 hours (because of the sedation medicines used during the test).   SYMPTOMS TO REPORT IMMEDIATELY: A gastroenterologist can be reached at any hour. Please call [phone]  for any of the following symptoms:  Following lower endoscopy (colonoscopy, flexible sigmoidoscopy) Excessive amounts of blood in the stool  Significant tenderness, worsening of abdominal  pains  Swelling of the abdomen that is new, acute  Fever of 100 or higher  Following upper endoscopy (EGD, EUS, ERCP, esophageal dilation) Vomiting of blood or coffee ground material  New, significant abdominal pain  New, significant chest pain or pain under the shoulder blades  Painful or persistently difficult swallowing  New shortness of breath  Black, tarry-looking or red, bloody stools  FOLLOW UP:  If any biopsies were taken you will be contacted by phone or by letter within the next 1-3 weeks. Call [phone]  if you have not heard about the biopsies in 3 weeks.  Please also call with any specific questions about appointments or follow up tests.

## 2017-09-06 ENCOUNTER — Telehealth: Payer: Self-pay | Admitting: *Deleted

## 2017-09-06 ENCOUNTER — Telehealth: Payer: Self-pay

## 2017-09-06 NOTE — Telephone Encounter (Signed)
  Follow up Call-  Call back number 09/05/2017  Post procedure Call Back phone  # [phone]  Permission to leave phone message Yes  Some recent data might be hidden     Patient questions:  Do you have a fever, pain , or abdominal swelling? No. Pain Score  0 *  Have you tolerated food without any problems? Yes.    Have you been able to return to your normal activities? Yes.    Do you have any questions about your discharge instructions: Diet   No. Medications  No. Follow up visit  No.  Do you have questions or concerns about your Care? No.  Actions: * If pain score is 4 or above: No action needed, pain <4.

## 2017-09-06 NOTE — Telephone Encounter (Signed)
  Follow up Call-  Call back number 09/05/2017  Post procedure Call Back phone  # [phone]  Permission to leave phone message Yes  Some recent data might be hidden     Left message

## 2017-09-17 ENCOUNTER — Encounter: Payer: Self-pay | Admitting: Gastroenterology

## 2017-10-26 ENCOUNTER — Other Ambulatory Visit (INDEPENDENT_AMBULATORY_CARE_PROVIDER_SITE_OTHER): Payer: Medicare Other

## 2017-10-26 ENCOUNTER — Other Ambulatory Visit: Payer: Medicare Other

## 2017-10-26 DIAGNOSIS — E119 Type 2 diabetes mellitus without complications: Secondary | ICD-10-CM

## 2017-10-26 DIAGNOSIS — I10 Essential (primary) hypertension: Secondary | ICD-10-CM | POA: Diagnosis not present

## 2017-10-26 DIAGNOSIS — D649 Anemia, unspecified: Secondary | ICD-10-CM | POA: Diagnosis not present

## 2017-10-26 LAB — COMPREHENSIVE METABOLIC PANEL
ALK PHOS: 55 U/L (ref 39–117)
ALT: 12 U/L (ref 0–35)
AST: 15 U/L (ref 0–37)
Albumin: 4.3 g/dL (ref 3.5–5.2)
BILIRUBIN TOTAL: 0.9 mg/dL (ref 0.2–1.2)
BUN: 22 mg/dL (ref 6–23)
CO2: 26 meq/L (ref 19–32)
Calcium: 9.7 mg/dL (ref 8.4–10.5)
Chloride: 106 mEq/L (ref 96–112)
Creatinine, Ser: 0.75 mg/dL (ref 0.40–1.20)
GFR: 78.83 mL/min (ref >60.00)
GLUCOSE: 172 mg/dL — AB (ref 70–99)
POTASSIUM: 4.4 meq/L (ref 3.5–5.1)
SODIUM: 141 meq/L (ref 135–145)
TOTAL PROTEIN: 6.9 g/dL (ref 6.0–8.3)

## 2017-10-26 LAB — LIPID PANEL
CHOLESTEROL: 119 mg/dL (ref 0–200)
HDL: 54.6 mg/dL (ref >39.00)
LDL Cholesterol: 35 mg/dL (ref 0–99)
NONHDL: 64.18
Total CHOL/HDL Ratio: 2
Triglycerides: 148 mg/dL (ref 0.0–149.0)
VLDL: 29.6 mg/dL (ref 0.0–40.0)

## 2017-10-26 LAB — HEMOGLOBIN A1C: HEMOGLOBIN A1C: 7.1 % — AB (ref 4.6–6.5)

## 2017-10-26 LAB — CBC WITH DIFFERENTIAL/PLATELET
Basophils Absolute: 0.1 10*3/uL (ref 0.0–0.1)
Basophils Relative: 0.8 % (ref 0.0–3.0)
EOS PCT: 2.7 % (ref 0.0–5.0)
Eosinophils Absolute: 0.2 10*3/uL (ref 0.0–0.7)
HCT: 32.6 % — ABNORMAL LOW (ref 36.0–46.0)
Hemoglobin: 10.2 g/dL — ABNORMAL LOW (ref 12.0–15.0)
LYMPHS ABS: 1.9 10*3/uL (ref 0.7–4.0)
Lymphocytes Relative: 26.3 % (ref 12.0–46.0)
MCHC: 31.4 g/dL (ref 30.0–36.0)
MCV: 76.5 fl — AB (ref 78.0–100.0)
MONO ABS: 0.5 10*3/uL (ref 0.1–1.0)
MONOS PCT: 6.7 % (ref 3.0–12.0)
NEUTROS ABS: 4.5 10*3/uL (ref 1.4–7.7)
Neutrophils Relative %: 63.5 % (ref 43.0–77.0)
PLATELETS: 266 10*3/uL (ref 150.0–400.0)
RBC: 4.26 Mil/uL (ref 3.87–5.11)
RDW: 16.8 % — AB (ref 11.5–15.5)
WBC: 7.1 10*3/uL (ref 4.0–10.5)

## 2017-10-26 LAB — TSH: TSH: 1.06 u[IU]/mL (ref 0.35–4.50)

## 2017-10-26 LAB — FERRITIN: Ferritin: 4.4 ng/mL — ABNORMAL LOW (ref 10.0–291.0)

## 2017-10-31 ENCOUNTER — Encounter: Payer: Self-pay | Admitting: Family Medicine

## 2017-10-31 ENCOUNTER — Ambulatory Visit (INDEPENDENT_AMBULATORY_CARE_PROVIDER_SITE_OTHER): Payer: Medicare Other | Admitting: Family Medicine

## 2017-10-31 VITALS — BP 130/78 | HR 77 | Temp 98.1°F | Ht 61.0 in | Wt 215.5 lb

## 2017-10-31 DIAGNOSIS — E7800 Pure hypercholesterolemia, unspecified: Secondary | ICD-10-CM | POA: Diagnosis not present

## 2017-10-31 DIAGNOSIS — I10 Essential (primary) hypertension: Secondary | ICD-10-CM

## 2017-10-31 DIAGNOSIS — E119 Type 2 diabetes mellitus without complications: Secondary | ICD-10-CM

## 2017-10-31 DIAGNOSIS — D508 Other iron deficiency anemias: Secondary | ICD-10-CM | POA: Diagnosis not present

## 2017-10-31 DIAGNOSIS — R21 Rash and other nonspecific skin eruption: Secondary | ICD-10-CM | POA: Diagnosis not present

## 2017-10-31 MED ORDER — POLYSACCHARIDE IRON COMPLEX 150 MG PO CAPS: 150.0000 mg | ORAL_CAPSULE | Freq: Two times a day (BID) | ORAL | 1 refills | Status: DC

## 2017-10-31 MED ORDER — TRIAMCINOLONE ACETONIDE 0.1 % EX CREA: 1.0000 "application " | TOPICAL_CREAM | Freq: Two times a day (BID) | CUTANEOUS | 3 refills | Status: DC

## 2017-10-31 NOTE — Assessment & Plan Note (Signed)
Discussed how this problem influences overall health and the risks it imposes  Reviewed plan for weight loss with lower calorie diet (via better food choices and also portion control or program like weight watchers) and exercise building up to or more than 30 minutes 5 days per week including some aerobic activity    

## 2017-10-31 NOTE — Assessment & Plan Note (Signed)
Disc goals for lipids and reasons to control them Rev last labs with pt Rev low sat fat diet in detail  Stable with zocor and diet

## 2017-10-31 NOTE — Progress Notes (Signed)
Subjective:    Patient ID: Gabriella Howe, female    DOB: 03-12-1935, 83 y.o.   MRN: [id number]  HPI  Here for 3 mo f/u of chronic medical problems  Frustrated with aging in general   Wt Readings from Last 3 Encounters:  10/31/17 215 lb 8 oz (97.8 kg)  09/05/17 211 lb (95.7 kg)  08/16/17 211 lb 8 oz (95.9 kg)  wt up 4 lb  40.72 kg/m   bp is stable today  No cp or palpitations or headaches or edema  No side effects to medicines  BP Readings from Last 3 Encounters:  10/31/17 130/78  09/05/17 (!) 166/91  08/16/17 140/80   Lab Results  Component Value Date   CREATININE 0.75 10/26/2017   BUN 22 10/26/2017   NA 141 10/26/2017   K 4.4 10/26/2017   CL 106 10/26/2017   CO2 26 10/26/2017   Lab Results  Component Value Date   ALT 12 10/26/2017   AST 15 10/26/2017   ALKPHOS 55 10/26/2017   BILITOT 0.9 10/26/2017     DM2 Lab Results  Component Value Date   HGBA1C 7.1 (H) 10/26/2017   This is stable from last time -still not making much effort to change diet  Glucose occ drop if she does not eat every 3-4 hours (does not want to adv glipizide) Some frozen dinners  For exercise -she walks in the house and to the end of the driveway  Eye exam 3/90   H/o anemia Lab Results  Component Value Date   WBC 7.1 10/26/2017   HGB 10.2 (L) 10/26/2017   HCT 32.6 (L) 10/26/2017   MCV 76.5 (L) 10/26/2017   PLT 266.0 10/26/2017   Lab Results  Component Value Date   FERRITIN 4.4 (L) 10/26/2017   Up from 3.6  Colonoscopy - 6/19 - polyp with non bleeding hemorrhoids EGD 6/19-normal  She still c/o of early satiety / gets stomach ache if she over eats  On omeprazole No heartburn    Hyperlipidemia Lab Results  Component Value Date   CHOL 119 10/26/2017   CHOL 121 04/23/2017   CHOL 121 10/24/2016   Lab Results  Component Value Date   HDL 54.60 10/26/2017   HDL 53.60 04/23/2017   HDL 52.00 10/24/2016   Lab Results  Component Value Date   LDLCALC 35 10/26/2017   LDLCALC 44 04/23/2017   LDLCALC 41 10/24/2016   Lab Results  Component Value Date   TRIG 148.0 10/26/2017   TRIG 118.0 04/23/2017   TRIG 140.0 10/24/2016   Lab Results  Component Value Date   CHOLHDL 2 10/26/2017   CHOLHDL 2 04/23/2017   CHOLHDL 2 10/24/2016   No results found for: LDLDIRECT On zocor and diet  Well controled   Patient Active Problem List   Diagnosis Date Noted  . Somnolence, daytime 07/31/2017  . Iron deficiency anemia 04/30/2017  . History of shingles 10/03/2016  . Routine general medical examination at a health care facility 04/14/2015  . Back pain 07/28/2014  . Knee pain 07/28/2014  . Encounter for Medicare annual wellness exam 11/19/2012  . History of kidney stones 11/19/2012  . DIASTOLIC HEART FAILURE, CHRONIC 07/29/2009  . HYPERCHOLESTEROLEMIA, PURE 09/13/2006  . Morbid obesity ([place]) 09/10/2006  . CARPAL TUNNEL SYNDROME 09/10/2006  . Essential hypertension 09/10/2006  . DIASTOLIC DYSFUNCTION 30/11/2328  . REACTIVE AIRWAY DISEASE 09/10/2006  . GERD 09/10/2006  . ROSACEA 09/10/2006  . Degeneration of lumbar or lumbosacral intervertebral disc 09/10/2006  .  OSA (obstructive sleep apnea) 09/10/2006  . COLONIC POLYPS, HX OF 09/10/2006  . Diabetes type 2, controlled ([place]) 09/06/2006   Past Medical History:  Diagnosis Date  . Adenomatous colon polyp 2003  . Benign neoplasm of colon   . Carpal tunnel syndrome   . Chest pain, unspecified   . CHF (congestive heart failure) ([facility])    diastolic  . Degeneration of intervertebral disc, site unspecified   . Diabetes mellitus    type II  . Encounter for long-term (current) use of other medications   . Fluid overload   . GERD (gastroesophageal reflux disease)   . Heart disease, unspecified   . Hyperlipidemia   . Hypertension   . Kidney stone 3/12   hosp/ with stent  . Obesity, unspecified   . Rosacea   . Shortness of breath   . Unspecified asthma(493.90)   . Unspecified sleep apnea    Past  Surgical History:  Procedure Laterality Date  . ABDOMINAL HYSTERECTOMY     partial, fibroids  . ESOPHAGOGASTRODUODENOSCOPY  2000  . UMBILICAL HERNIA REPAIR  01/2004  . URETERAL STENT PLACEMENT  2/13   for L sided stone/ ecoli sepsis   Social History   Tobacco Use  . Smoking status: Never Smoker  . Smokeless tobacco: Never Used  Substance Use Topics  . Alcohol use: No    Alcohol/week: 0.0 oz  . Drug use: No   Family History  Problem Relation Age of Onset  . Early death Father 33       train accident  . Diabetes Mother   . Heart disease Mother   . Diabetes Sister   . Diabetes Brother   . Colon cancer Brother   . Cancer Paternal Grandmother        unknown  . Diabetes Sister    Allergies  Allergen Reactions  . Rofecoxib     REACTION: reaction not known  . Tetanus-Diphtheria Toxoids Td     REACTION: sick with fever  . Antihistamines, Chlorpheniramine-Type Palpitations  . Sulfa Antibiotics Rash   Current Outpatient Medications on File Prior to Visit  Medication Sig Dispense Refill  . Ascorbic Acid (VITAMIN C) 500 MG tablet Take 500 mg by mouth daily.      . Aspirin-Caffeine (BC FAST PAIN RELIEF PO) Take by mouth as directed.      . benazepril (LOTENSIN) 20 MG tablet Take 1 tablet (20 mg total) by mouth daily. 90 tablet 3  . CALCIUM-VITAMIN D PO Take 1 tablet by mouth 2 (two) times daily.     . furosemide (LASIX) 40 MG tablet TAKE ONE-HALF TABLET BY  MOUTH DAILY 45 tablet 1  . glipiZIDE (GLUCOTROL XL) 5 MG 24 hr tablet Take 1 tablet (5 mg total) by mouth daily with breakfast. 90 tablet 3  . glucose blood (BAYER CONTOUR TEST) test strip Use to check blood sugar once daily as directed for DM (Dx. E11.9) 100 each 0  . Magnesium 250 MG TABS Take by mouth daily.    . metFORMIN (GLUCOPHAGE) 1000 MG tablet TAKE 1 TABLET BY MOUTH TWO  TIMES DAILY WITH A MEAL 180 tablet 3  . MICROLET LANCETS MISC Use to check blood sugar once daily as directed for DM (Dx. E11.9) 100 each 0  .  Multiple Vitamin (MULTIVITAMIN) capsule Take 1 capsule by mouth daily.      . Omega-3 Fatty Acids (FISH OIL PO) Take 1 capsule by mouth daily.     Marland Kitchen omeprazole (PRILOSEC) 20 MG capsule  Take 1 capsule (20 mg total) by mouth daily. 90 capsule 3  . Pseudoephedrine HCl (SINUS & ALLERGY 12 HOUR PO) Take by mouth as directed.      . simvastatin (ZOCOR) 40 MG tablet Take 1 tablet (40 mg total) by mouth at bedtime. 90 tablet 3  . vitamin E 400 UNIT capsule Take 400 Units by mouth daily.       Current Facility-Administered Medications on File Prior to Visit  Medication Dose Route Frequency Provider Last Rate Last Dose  . 0.9 %  sodium chloride infusion  500 mL Intravenous Once Nandigam, Venia Minks, MD         Review of Systems  Constitutional: Positive for fatigue. Negative for activity change, appetite change, fever and unexpected weight change.  HENT: Negative for congestion, ear pain, rhinorrhea, sinus pressure and sore throat.   Eyes: Negative for pain, redness and visual disturbance.  Respiratory: Negative for cough, shortness of breath and wheezing.   Cardiovascular: Negative for chest pain and palpitations.  Gastrointestinal: Negative for abdominal distention, abdominal pain, blood in stool, constipation and diarrhea.  Endocrine: Negative for polydipsia and polyuria.  Genitourinary: Negative for dysuria, frequency and urgency.  Musculoskeletal: Negative for arthralgias, back pain and myalgias.  Skin: Negative for pallor and rash.  Allergic/Immunologic: Negative for environmental allergies.  Neurological: Negative for dizziness, syncope and headaches.  Hematological: Negative for adenopathy. Does not bruise/bleed easily.  Psychiatric/Behavioral: Negative for decreased concentration and dysphoric mood. The patient is not nervous/anxious.        Objective:   Physical Exam  Constitutional: She appears well-developed and well-nourished. No distress.  obese and well appearing   HENT:  Head:  Normocephalic and atraumatic.  Mouth/Throat: Oropharynx is clear and moist.  Eyes: Pupils are equal, round, and reactive to light. Conjunctivae and EOM are normal.  Neck: Normal range of motion. Neck supple. No JVD present. Carotid bruit is not present. No thyromegaly present.  Cardiovascular: Normal rate, regular rhythm, normal heart sounds and intact distal pulses. Exam reveals no gallop.  Pulmonary/Chest: Effort normal and breath sounds normal. No respiratory distress. She has no wheezes. She has no rales.  No crackles  Abdominal: Soft. Bowel sounds are normal. She exhibits no distension, no abdominal bruit and no mass. There is no tenderness. There is no rebound and no guarding.  Musculoskeletal: She exhibits no edema or tenderness.  Lymphadenopathy:    She has no cervical adenopathy.  Neurological: She is alert. She has normal reflexes. She displays normal reflexes. No cranial nerve deficit. Coordination normal.  Skin: Skin is warm and dry. No rash noted. No erythema.  Psychiatric: She has a normal mood and affect.  Pleasant           Assessment & Plan:   Problem List Items Addressed This Visit      Cardiovascular and Mediastinum   Essential hypertension    bp in fair control at this time  BP Readings from Last 1 Encounters:  10/31/17 130/78   No changes needed Most recent labs reviewed  Disc lifstyle change with low sodium diet and exercise          Endocrine   Diabetes type 2, controlled ([place]) - Primary    Lab Results  Component Value Date   HGBA1C 7.1 (H) 10/26/2017   Stable /no change  Apprehensive to adv glipizide due to hypoglycemia  Disc carb control and imp of frequent small meals with protein  Also exercise/ wt loss effort Eye exam utd Eye  and foot care discussed  F/u 6 mo        Other   HYPERCHOLESTEROLEMIA, PURE    Disc goals for lipids and reasons to control them Rev last labs with pt Rev low sat fat diet in detail  Stable with zocor and  diet        Iron deficiency anemia    Reassuring colonoscopy and EGD Unsure if iron abs problem  Hb down and ferritin still low Will start nu-iron 150 bid  Re check cbc/ferritin 1 mo  Update if side eff or problems If no improvement-consider hematology ref for IV iron       Relevant Medications   iron polysaccharides (NIFEREX) 150 MG capsule   Morbid obesity ([place])    Discussed how this problem influences overall health and the risks it imposes  Reviewed plan for weight loss with lower calorie diet (via better food choices and also portion control or program like weight watchers) and exercise building up to or more than 30 minutes 5 days per week including some aerobic activity          Other Visit Diagnoses    Rash and nonspecific skin eruption       Relevant Medications   triamcinolone cream (KENALOG) 0.1 %

## 2017-10-31 NOTE — Assessment & Plan Note (Signed)
Reassuring colonoscopy and EGD Unsure if iron abs problem  Hb down and ferritin still low Will start nu-iron 150 bid  Re check cbc/ferritin 1 mo  Update if side eff or problems If no improvement-consider hematology ref for IV iron

## 2017-10-31 NOTE — Patient Instructions (Addendum)
Eat small meals every 3-4 hours with protein  Try to get most of your carbohydrates from produce (with the exception of white potatoes)  Eat less bread/pasta/rice/snack foods/cereals/sweets and other items from the middle of the grocery store (processed carbs)   Start iron twice daily  If any constipation-take a stool softener If you do not tolerate it - call and let me know  Otherwise we will check labs in a month   We will see you for annual exam in Feb

## 2017-10-31 NOTE — Assessment & Plan Note (Signed)
bp in fair control at this time  BP Readings from Last 1 Encounters:  10/31/17 130/78   No changes needed Most recent labs reviewed  Disc lifstyle change with low sodium diet and exercise

## 2017-10-31 NOTE — Assessment & Plan Note (Signed)
Lab Results  Component Value Date   HGBA1C 7.1 (H) 10/26/2017   Stable /no change  Apprehensive to adv glipizide due to hypoglycemia  Disc carb control and imp of frequent small meals with protein  Also exercise/ wt loss effort Eye exam utd Eye and foot care discussed  F/u 6 mo

## 2017-11-17 DIAGNOSIS — I10 Essential (primary) hypertension: Secondary | ICD-10-CM | POA: Diagnosis not present

## 2017-11-17 DIAGNOSIS — R030 Elevated blood-pressure reading, without diagnosis of hypertension: Secondary | ICD-10-CM | POA: Diagnosis not present

## 2017-11-28 ENCOUNTER — Telehealth: Payer: Self-pay | Admitting: Internal Medicine

## 2017-11-28 NOTE — Telephone Encounter (Signed)
Deleting Recall per patient request  States " I wont see him again"

## 2017-12-04 ENCOUNTER — Other Ambulatory Visit (INDEPENDENT_AMBULATORY_CARE_PROVIDER_SITE_OTHER): Payer: Medicare Other

## 2017-12-04 DIAGNOSIS — D508 Other iron deficiency anemias: Secondary | ICD-10-CM | POA: Diagnosis not present

## 2017-12-04 LAB — CBC WITH DIFFERENTIAL/PLATELET
BASOS ABS: 0.1 10*3/uL (ref 0.0–0.1)
Basophils Relative: 1.2 % (ref 0.0–3.0)
EOS ABS: 0.2 10*3/uL (ref 0.0–0.7)
EOS PCT: 3.3 % (ref 0.0–5.0)
HCT: 32.4 % — ABNORMAL LOW (ref 36.0–46.0)
Hemoglobin: 10.2 g/dL — ABNORMAL LOW (ref 12.0–15.0)
LYMPHS ABS: 1.6 10*3/uL (ref 0.7–4.0)
Lymphocytes Relative: 23.8 % (ref 12.0–46.0)
MCHC: 31.4 g/dL (ref 30.0–36.0)
MCV: 75.8 fl — AB (ref 78.0–100.0)
MONO ABS: 0.4 10*3/uL (ref 0.1–1.0)
MONOS PCT: 5.5 % (ref 3.0–12.0)
Neutro Abs: 4.4 10*3/uL (ref 1.4–7.7)
Neutrophils Relative %: 66.2 % (ref 43.0–77.0)
Platelets: 244 10*3/uL (ref 150.0–400.0)
RBC: 4.28 Mil/uL (ref 3.87–5.11)
RDW: 16.9 % — AB (ref 11.5–15.5)
WBC: 6.7 10*3/uL (ref 4.0–10.5)

## 2017-12-04 LAB — FERRITIN: Ferritin: 3.4 ng/mL — ABNORMAL LOW (ref 10.0–291.0)

## 2017-12-05 ENCOUNTER — Telehealth: Payer: Self-pay | Admitting: Family Medicine

## 2017-12-05 DIAGNOSIS — D508 Other iron deficiency anemias: Secondary | ICD-10-CM

## 2017-12-05 NOTE — Telephone Encounter (Signed)
-----   Message from Tammi Sou, [place] sent at 12/05/2017 12:33 PM EDT ----- Pt notified of labs and Dr. Marliss Coots comments. Pt agrees with referral because she said her fatigue is bad. Please put referral in and I advise pt our [facility] will call to schedule appt

## 2017-12-05 NOTE — Telephone Encounter (Signed)
Called Gabriella Howe. Gave her [facility] information and that they will call her directly to set her up this appointment.

## 2017-12-05 NOTE — Telephone Encounter (Signed)
Referral done Will route to [facility]  

## 2017-12-07 ENCOUNTER — Telehealth: Payer: Self-pay

## 2017-12-07 NOTE — Telephone Encounter (Signed)
Called to inform the patient of appointment time and date / no answer and unable to leave a message. Will contact the patient on Monday to confirm time and date for a new patient appointment

## 2017-12-10 ENCOUNTER — Telehealth: Payer: Self-pay

## 2017-12-10 NOTE — Telephone Encounter (Signed)
Spoke with the patient to conform new patient appointment time and date. The patient was agreeable and understanding to come in for the appointment schedule ( 12/13/17 at 10:00AM.)

## 2017-12-13 ENCOUNTER — Telehealth: Payer: Self-pay

## 2017-12-13 ENCOUNTER — Other Ambulatory Visit: Payer: Self-pay

## 2017-12-13 ENCOUNTER — Encounter: Payer: Self-pay | Admitting: Oncology

## 2017-12-13 ENCOUNTER — Inpatient Hospital Stay: Payer: Medicare Other | Attending: Oncology | Admitting: Oncology

## 2017-12-13 ENCOUNTER — Inpatient Hospital Stay: Payer: Medicare Other

## 2017-12-13 VITALS — BP 127/74 | HR 76 | Temp 97.9°F | Resp 18 | Ht 61.0 in | Wt 213.9 lb

## 2017-12-13 DIAGNOSIS — E538 Deficiency of other specified B group vitamins: Secondary | ICD-10-CM

## 2017-12-13 DIAGNOSIS — Z800 Family history of malignant neoplasm of digestive organs: Secondary | ICD-10-CM | POA: Insufficient documentation

## 2017-12-13 DIAGNOSIS — I10 Essential (primary) hypertension: Secondary | ICD-10-CM | POA: Insufficient documentation

## 2017-12-13 DIAGNOSIS — E119 Type 2 diabetes mellitus without complications: Secondary | ICD-10-CM | POA: Diagnosis not present

## 2017-12-13 DIAGNOSIS — D509 Iron deficiency anemia, unspecified: Secondary | ICD-10-CM | POA: Diagnosis not present

## 2017-12-13 DIAGNOSIS — Z809 Family history of malignant neoplasm, unspecified: Secondary | ICD-10-CM | POA: Insufficient documentation

## 2017-12-13 LAB — CBC WITH DIFFERENTIAL/PLATELET
Basophils Absolute: 0.1 10*3/uL (ref 0–0.1)
Basophils Relative: 1 %
EOS ABS: 0.2 10*3/uL (ref 0–0.7)
Eosinophils Relative: 2 %
HCT: 33.4 % — ABNORMAL LOW (ref 35.0–47.0)
Hemoglobin: 10.4 g/dL — ABNORMAL LOW (ref 12.0–16.0)
LYMPHS ABS: 1.7 10*3/uL (ref 1.0–3.6)
Lymphocytes Relative: 25 %
MCH: 23.9 pg — AB (ref 26.0–34.0)
MCHC: 31.2 g/dL — AB (ref 32.0–36.0)
MCV: 76.5 fL — ABNORMAL LOW (ref 80.0–100.0)
MONOS PCT: 6 %
Monocytes Absolute: 0.4 10*3/uL (ref 0.2–0.9)
Neutro Abs: 4.6 10*3/uL (ref 1.4–6.5)
Neutrophils Relative %: 66 %
Platelets: 244 10*3/uL (ref 150–440)
RBC: 4.37 MIL/uL (ref 3.80–5.20)
RDW: 17 % — AB (ref 11.5–14.5)
WBC: 7 10*3/uL (ref 3.6–11.0)

## 2017-12-13 LAB — RETICULOCYTES
RBC.: 4.41 MIL/uL (ref 3.80–5.20)
Retic Count, Absolute: 83.8 10*3/uL (ref 19.0–183.0)
Retic Ct Pct: 1.9 % (ref 0.4–3.1)

## 2017-12-13 LAB — IRON AND TIBC
Iron: 37 ug/dL (ref 28–170)
Saturation Ratios: 9 % — ABNORMAL LOW (ref 10.4–31.8)
TIBC: 426 ug/dL (ref 250–450)
UIBC: 389 ug/dL

## 2017-12-13 LAB — VITAMIN B12: Vitamin B-12: 286 pg/mL (ref 180–914)

## 2017-12-13 LAB — FOLATE: FOLATE: 41 ng/mL (ref >5.9)

## 2017-12-13 NOTE — Telephone Encounter (Signed)
Spoke with the patient and she has agreed to get the feraheme treatment x 2 doses, patient states she will not be able to do 9/23 or 9/25. The patient was understanding and agreeable to come in for treatment.

## 2017-12-13 NOTE — Progress Notes (Signed)
Hematology/Oncology Consult note [facility] Telephone:([phone] Fax:([phone]  Patient Care Team: Tower, Wynelle Fanny, MD as PCP - General Vin-Parikh, Deirdre Peer, MD as Referring Physician (Ophthalmology) Charmian Muff, DDS as Referring Physician (Dentistry)   Name of the patient: Gabriella Howe  [id number]  24-Feb-1935    Reason for referral- iron deficiency anemia   Referring physician- Dr. Glori Bickers  Date of visit: 12/13/17   History of presenting illness-patient is a 83 year old female with a past medical history significant for hyperlipidemia, type 2 diabetes, obstructive sleep apnea, chronic diastolic heart failure and history of kidney stones who is been referred to [place] for iron deficiency anemia.  Patient had a normal hemoglobin around 12 G January 2018 and then it started drifting down slowly from 11-10.  Most recent CBC on 12/04/2017 showed a hemoglobin of 10.22.4.  White count and platelet count was normal.  CMP was normal and ferritin was low at 4.4.  TSH was normal.  Patient did undergo EGD and colonoscopy by Dr. Silverio Decamp essentially normal colonoscopy did show polyps that was positive for tubular adenoma.  Capsule endoscopy has not been done.  Patient has been on oral iron for the last 6 weeks and she reports feeling fatigued.  Appetite is good and she denies any unintentional weight loss  ECOG PS- 1  Pain scale- 0   Review of systems- Review of Systems  Constitutional: Positive for malaise/fatigue. Negative for chills, fever and weight loss.  HENT: Negative for congestion, ear discharge and nosebleeds.   Eyes: Negative for blurred vision.  Respiratory: Negative for cough, hemoptysis, sputum production, shortness of breath and wheezing.   Cardiovascular: Negative for chest pain, palpitations, orthopnea and claudication.  Gastrointestinal: Negative for abdominal pain, blood in stool, constipation, diarrhea, heartburn, melena, nausea and  vomiting.  Genitourinary: Negative for dysuria, flank pain, frequency, hematuria and urgency.  Musculoskeletal: Negative for back pain, joint pain and myalgias.  Skin: Negative for rash.  Neurological: Negative for dizziness, tingling, focal weakness, seizures, weakness and headaches.  Endo/Heme/Allergies: Does not bruise/bleed easily.  Psychiatric/Behavioral: Negative for depression and suicidal ideas. The patient does not have insomnia.     Allergies  Allergen Reactions  . Rofecoxib     REACTION: reaction not known  . Tetanus-Diphtheria Toxoids Td     REACTION: sick with fever  . Antihistamines, Chlorpheniramine-Type Palpitations  . Sulfa Antibiotics Rash    Patient Active Problem List   Diagnosis Date Noted  . Somnolence, daytime 07/31/2017  . Iron deficiency anemia 04/30/2017  . History of shingles 10/03/2016  . Routine general medical examination at a health care facility 04/14/2015  . Back pain 07/28/2014  . Knee pain 07/28/2014  . Encounter for Medicare annual wellness exam 11/19/2012  . History of kidney stones 11/19/2012  . DIASTOLIC HEART FAILURE, CHRONIC 07/29/2009  . HYPERCHOLESTEROLEMIA, PURE 09/13/2006  . Morbid obesity ([place]) 09/10/2006  . CARPAL TUNNEL SYNDROME 09/10/2006  . Essential hypertension 09/10/2006  . DIASTOLIC DYSFUNCTION 85/46/2703  . REACTIVE AIRWAY DISEASE 09/10/2006  . GERD 09/10/2006  . ROSACEA 09/10/2006  . Degeneration of lumbar or lumbosacral intervertebral disc 09/10/2006  . OSA (obstructive sleep apnea) 09/10/2006  . COLONIC POLYPS, HX OF 09/10/2006  . Diabetes type 2, controlled ([place]) 09/06/2006     Past Medical History:  Diagnosis Date  . Adenomatous colon polyp 2003  . Benign neoplasm of colon   . Carpal tunnel syndrome   . Chest pain, unspecified   . CHF (congestive heart failure) ([place])  diastolic  . Degeneration of intervertebral disc, site unspecified   . Diabetes mellitus    type II  . Encounter for long-term  (current) use of other medications   . Fluid overload   . GERD (gastroesophageal reflux disease)   . Heart disease, unspecified   . Hyperlipidemia   . Hypertension   . Kidney stone 3/12   hosp/ with stent  . Obesity, unspecified   . Rosacea   . Shortness of breath   . Unspecified asthma(493.90)   . Unspecified sleep apnea      Past Surgical History:  Procedure Laterality Date  . ABDOMINAL HYSTERECTOMY     partial, fibroids  . ESOPHAGOGASTRODUODENOSCOPY  2000  . UMBILICAL HERNIA REPAIR  01/2004  . URETERAL STENT PLACEMENT  2/13   for L sided stone/ ecoli sepsis    Social History   Socioeconomic History  . Marital status: Widowed    Spouse name: Not on file  . Number of children: Not on file  . Years of education: Not on file  . Highest education level: Not on file  Occupational History  . Not on file  Social Needs  . Financial resource strain: Not on file  . Food insecurity:    Worry: Not on file    Inability: Not on file  . Transportation needs:    Medical: Not on file    Non-medical: Not on file  Tobacco Use  . Smoking status: Never Smoker  . Smokeless tobacco: Never Used  Substance and Sexual Activity  . Alcohol use: No    Alcohol/week: 0.0 standard drinks  . Drug use: No  . Sexual activity: Never  Lifestyle  . Physical activity:    Days per week: Not on file    Minutes per session: Not on file  . Stress: Not on file  Relationships  . Social connections:    Talks on phone: Not on file    Gets together: Not on file    Attends religious service: Not on file    Active member of club or organization: Not on file    Attends meetings of clubs or organizations: Not on file    Relationship status: Not on file  . Intimate partner violence:    Fear of current or ex partner: Not on file    Emotionally abused: Not on file    Physically abused: Not on file    Forced sexual activity: Not on file  Other Topics Concern  . Not on file  Social History Narrative   . Not on file     Family History  Problem Relation Age of Onset  . Early death Father 21       train accident  . Diabetes Mother   . Heart disease Mother   . Diabetes Sister   . Diabetes Brother   . Colon cancer Brother   . Cancer Paternal Grandmother        unknown  . Diabetes Sister      Current Outpatient Medications:  .  Ascorbic Acid (VITAMIN C) 500 MG tablet, Take 500 mg by mouth daily.  , Disp: , Rfl:  .  benazepril (LOTENSIN) 20 MG tablet, Take 1 tablet (20 mg total) by mouth daily., Disp: 90 tablet, Rfl: 3 .  CALCIUM-VITAMIN D PO, Take 1 tablet by mouth 2 (two) times daily. , Disp: , Rfl:  .  furosemide (LASIX) 40 MG tablet, TAKE ONE-HALF TABLET BY  MOUTH DAILY, Disp: 45 tablet, Rfl: 1 .  glipiZIDE (GLUCOTROL XL) 5 MG 24 hr tablet, Take 1 tablet (5 mg total) by mouth daily with breakfast., Disp: 90 tablet, Rfl: 3 .  glucose blood (BAYER CONTOUR TEST) test strip, Use to check blood sugar once daily as directed for DM (Dx. E11.9), Disp: 100 each, Rfl: 0 .  iron polysaccharides (NIFEREX) 150 MG capsule, Take 1 capsule (150 mg total) by mouth 2 (two) times daily., Disp: 180 capsule, Rfl: 1 .  Magnesium 250 MG TABS, Take by mouth daily., Disp: , Rfl:  .  metFORMIN (GLUCOPHAGE) 1000 MG tablet, TAKE 1 TABLET BY MOUTH TWO  TIMES DAILY WITH A MEAL, Disp: 180 tablet, Rfl: 3 .  MICROLET LANCETS MISC, Use to check blood sugar once daily as directed for DM (Dx. E11.9), Disp: 100 each, Rfl: 0 .  Multiple Vitamin (MULTIVITAMIN) capsule, Take 1 capsule by mouth daily.  , Disp: , Rfl:  .  Omega-3 Fatty Acids (FISH OIL PO), Take 1 capsule by mouth daily. , Disp: , Rfl:  .  omeprazole (PRILOSEC) 20 MG capsule, Take 1 capsule (20 mg total) by mouth daily., Disp: 90 capsule, Rfl: 3 .  simvastatin (ZOCOR) 40 MG tablet, Take 1 tablet (40 mg total) by mouth at bedtime., Disp: 90 tablet, Rfl: 3 .  vitamin E 400 UNIT capsule, Take 400 Units by mouth daily.  , Disp: , Rfl:  .  Aspirin-Caffeine (BC  FAST PAIN RELIEF PO), Take by mouth as directed.  , Disp: , Rfl:  .  Pseudoephedrine HCl (SINUS & ALLERGY 12 HOUR PO), Take by mouth as directed.  , Disp: , Rfl:  .  triamcinolone cream (KENALOG) 0.1 %, Apply 1 application topically 2 (two) times daily. Apply to affected areas twice daily. (Patient not taking: Reported on 12/13/2017), Disp: 30 g, Rfl: 3  Current Facility-Administered Medications:  .  0.9 %  sodium chloride infusion, 500 mL, Intravenous, Once, Nandigam, Kavitha V, MD   Physical exam:  Vitals:   12/13/17 1023  BP: 127/74  Pulse: 76  Resp: 18  Temp: 97.9 F (36.6 C)  TempSrc: Tympanic  SpO2: 94%  Weight: 213 lb 14.4 oz (97 kg)  Height: 5\' 1"  (1.549 m)   Physical Exam  Constitutional: She is oriented to person, place, and time.  Patient is obese.  Does not appear to be in any acute distress  HENT:  Head: Normocephalic and atraumatic.  Eyes: Pupils are equal, round, and reactive to light. EOM are normal.  Neck: Normal range of motion.  Cardiovascular: Normal rate, regular rhythm and normal heart sounds.  Pulmonary/Chest: Effort normal and breath sounds normal.  Abdominal: Soft. Bowel sounds are normal.  Neurological: She is alert and oriented to person, place, and time.  Skin: Skin is warm and dry.       CMP Latest Ref Rng & Units 10/26/2017  Glucose 70 - 99 mg/dL 172(H)  BUN 6 - 23 mg/dL 22  Creatinine 0.40 - 1.20 mg/dL 0.75  Sodium 135 - 145 mEq/L 141  Potassium 3.5 - 5.1 mEq/L 4.4  Chloride 96 - 112 mEq/L 106  CO2 19 - 32 mEq/L 26  Calcium 8.4 - 10.5 mg/dL 9.7  Total Protein 6.0 - 8.3 g/dL 6.9  Total Bilirubin 0.2 - 1.2 mg/dL 0.9  Alkaline Phos 39 - 117 U/L 55  AST 0 - 37 U/L 15  ALT 0 - 35 U/L 12   CBC Latest Ref Rng & Units 12/13/2017  WBC 3.6 - 11.0 K/uL 7.0  Hemoglobin 12.0 -  16.0 g/dL 10.4(L)  Hematocrit 35.0 - 47.0 % 33.4(L)  Platelets 150 - 440 K/uL 244     Assessment and plan- Patient is a 83 y.o. female referred for iron deficiency  anemia  Patient has been on oral iron for him on 6 weeks but has not had any significant improvement in her hemoglobin.  I therefore recommend IV iron 510 mg of Feraheme x2 doses weekly.  Discussed risks and benefits of IV iron including all but not limited to headaches, leg swelling and possible risk of infusion reaction.  Patient understands and would like to see what her repeat CBC is today and if there is no improvement she is agreeable to proceeding with IV iron.  I will also check urine analysis, stool H. pylori antigen to look into other causes of iron deficiency anemia.  I will also touch base with Dr. Silverio Decamp to see if she would consider doing a capsule study to complete her GI work-up.  I will be checking her B12,  folate, iron studies, reticulocyte count and haptoglobin today  I will see her back in 2 months with repeat CBC ferritin and iron studies   Thank you for this kind referral and the opportunity to participate in the care of this patient   Visit Diagnosis 1. Iron deficiency anemia, unspecified iron deficiency anemia type     Dr. Randa Evens, MD, MPH [facility] [phone] 12/13/2017 12:01 PM

## 2017-12-14 ENCOUNTER — Telehealth: Payer: Self-pay

## 2017-12-14 LAB — HAPTOGLOBIN: Haptoglobin: 160 mg/dL (ref 34–200)

## 2017-12-14 NOTE — Telephone Encounter (Signed)
-----   Message from Sindy Guadeloupe, MD sent at 12/14/2017  8:28 AM EDT ----- Her b12 is low normal. I think she takes oral B12. We could give her b12 shots when she comes for her 2 iron infusions if she is agreeable. She can continue taking po b12. Thanks, Astrid Divine

## 2017-12-14 NOTE — Telephone Encounter (Signed)
Spoke with the patient to inform her that her b-12 levels are low and per Dr Janese Banks she would to give a b-12 injection when she return for her feraheme treatment. The also was informed to start taking B-12 1,000 mcg daily. The patient was understanding and agreeable.

## 2017-12-17 ENCOUNTER — Ambulatory Visit: Payer: Medicare Other

## 2017-12-18 ENCOUNTER — Inpatient Hospital Stay: Payer: Medicare Other

## 2017-12-18 ENCOUNTER — Other Ambulatory Visit: Payer: Self-pay

## 2017-12-18 VITALS — BP 135/72 | HR 80 | Temp 96.0°F | Resp 18

## 2017-12-18 DIAGNOSIS — D509 Iron deficiency anemia, unspecified: Secondary | ICD-10-CM

## 2017-12-18 DIAGNOSIS — E119 Type 2 diabetes mellitus without complications: Secondary | ICD-10-CM | POA: Diagnosis not present

## 2017-12-18 DIAGNOSIS — Z809 Family history of malignant neoplasm, unspecified: Secondary | ICD-10-CM | POA: Diagnosis not present

## 2017-12-18 DIAGNOSIS — Z800 Family history of malignant neoplasm of digestive organs: Secondary | ICD-10-CM | POA: Diagnosis not present

## 2017-12-18 DIAGNOSIS — I10 Essential (primary) hypertension: Secondary | ICD-10-CM | POA: Diagnosis not present

## 2017-12-18 LAB — URINALYSIS, COMPLETE (UACMP) WITH MICROSCOPIC
Bilirubin Urine: NEGATIVE
Glucose, UA: NEGATIVE mg/dL
Ketones, ur: NEGATIVE mg/dL
Nitrite: POSITIVE — AB
Protein, ur: 30 mg/dL — AB
Specific Gravity, Urine: 1.012 (ref 1.005–1.030)
WBC, UA: >50 WBC/hpf — ABNORMAL HIGH (ref 0–5)
pH: 5 (ref 5.0–8.0)

## 2017-12-18 MED ORDER — SODIUM CHLORIDE 0.9 % IV SOLN
Freq: Once | INTRAVENOUS | Status: AC
  Administered 2017-12-18: 14:00:00 via INTRAVENOUS
  Filled 2017-12-18: qty 250

## 2017-12-18 MED ORDER — SODIUM CHLORIDE 0.9 % IV SOLN
510.0000 mg | Freq: Once | INTRAVENOUS | Status: AC
  Administered 2017-12-18: 510 mg via INTRAVENOUS
  Filled 2017-12-18: qty 17

## 2017-12-20 ENCOUNTER — Other Ambulatory Visit: Payer: Self-pay

## 2017-12-20 ENCOUNTER — Telehealth: Payer: Self-pay | Admitting: *Deleted

## 2017-12-20 DIAGNOSIS — D509 Iron deficiency anemia, unspecified: Secondary | ICD-10-CM | POA: Diagnosis not present

## 2017-12-20 DIAGNOSIS — Z809 Family history of malignant neoplasm, unspecified: Secondary | ICD-10-CM | POA: Diagnosis not present

## 2017-12-20 DIAGNOSIS — I10 Essential (primary) hypertension: Secondary | ICD-10-CM | POA: Diagnosis not present

## 2017-12-20 DIAGNOSIS — Z800 Family history of malignant neoplasm of digestive organs: Secondary | ICD-10-CM | POA: Diagnosis not present

## 2017-12-20 DIAGNOSIS — E119 Type 2 diabetes mellitus without complications: Secondary | ICD-10-CM | POA: Diagnosis not present

## 2017-12-20 NOTE — Telephone Encounter (Signed)
-----   Message from Sindy Guadeloupe, MD sent at 12/20/2017  8:04 AM EDT ----- Urine was checked for hematuria. She has features of UTI which can cause blood in urine. If she is asymptomatic I would not give antibiotics. Repeat UA in 1 week. Thanks, Astrid Divine

## 2017-12-20 NOTE — Telephone Encounter (Signed)
Tried calling patient on her cell phone there was no answer but a message came on and said leave a message about the voicemail is full and cannot leave messages.  We will attempt to call tomorrow and see if I can get patient to discuss results

## 2017-12-22 LAB — H. PYLORI ANTIGEN, STOOL: H. Pylori Stool Ag, Eia: NEGATIVE

## 2017-12-24 ENCOUNTER — Other Ambulatory Visit: Payer: Self-pay | Admitting: Oncology

## 2017-12-24 ENCOUNTER — Inpatient Hospital Stay: Payer: Medicare Other

## 2017-12-24 ENCOUNTER — Other Ambulatory Visit: Payer: Self-pay | Admitting: Family Medicine

## 2017-12-24 ENCOUNTER — Telehealth: Payer: Self-pay | Admitting: *Deleted

## 2017-12-24 VITALS — BP 131/77 | HR 77 | Temp 97.0°F | Resp 18

## 2017-12-24 DIAGNOSIS — D509 Iron deficiency anemia, unspecified: Secondary | ICD-10-CM | POA: Diagnosis not present

## 2017-12-24 DIAGNOSIS — Z809 Family history of malignant neoplasm, unspecified: Secondary | ICD-10-CM | POA: Diagnosis not present

## 2017-12-24 DIAGNOSIS — Z800 Family history of malignant neoplasm of digestive organs: Secondary | ICD-10-CM | POA: Diagnosis not present

## 2017-12-24 DIAGNOSIS — I10 Essential (primary) hypertension: Secondary | ICD-10-CM | POA: Diagnosis not present

## 2017-12-24 DIAGNOSIS — E119 Type 2 diabetes mellitus without complications: Secondary | ICD-10-CM | POA: Diagnosis not present

## 2017-12-24 MED ORDER — CYANOCOBALAMIN 1000 MCG/ML IJ SOLN
1000.0000 ug | INTRAMUSCULAR | Status: DC
  Administered 2017-12-24: 1000 ug via INTRAMUSCULAR
  Filled 2017-12-24: qty 1

## 2017-12-24 MED ORDER — SODIUM CHLORIDE 0.9 % IV SOLN
510.0000 mg | Freq: Once | INTRAVENOUS | Status: AC
  Administered 2017-12-24: 510 mg via INTRAVENOUS
  Filled 2017-12-24: qty 17

## 2017-12-24 MED ORDER — SODIUM CHLORIDE 0.9 % IV SOLN
Freq: Once | INTRAVENOUS | Status: AC
  Administered 2017-12-24: 14:00:00 via INTRAVENOUS
  Filled 2017-12-24: qty 250

## 2017-12-24 NOTE — Telephone Encounter (Signed)
Called pt and got her voicemail and left message that I called last week to see if she had any urinary tract sx such as burning with urination, going to bathroom more frequently. Or when she goes to the bathroom and does not urinate as much as possible. I have asked her to call me back for sx

## 2017-12-24 NOTE — Telephone Encounter (Signed)
-----   Message from Sindy Guadeloupe, MD sent at 12/20/2017  8:04 AM EDT ----- Urine was checked for hematuria. She has features of UTI which can cause blood in urine. If she is asymptomatic I would not give antibiotics. Repeat UA in 1 week. Thanks, Astrid Divine

## 2018-01-17 ENCOUNTER — Encounter: Payer: Self-pay | Admitting: *Deleted

## 2018-01-17 NOTE — Progress Notes (Signed)
Dr. Janese Banks sent me a message today stating that if patient has not called back whether or not she has urinary symptoms she probably does not have any and so therefore will talk to her when she comes back on her next appointment which is in early November

## 2018-02-05 NOTE — Progress Notes (Signed)
Discuss capsule endoscopy with the patient on the telephone. She prefers to wait until after her next iron infusion and "the end of open enrollment" with health insurance. Patient works with [employer]. Agrees to call us to schedule an appointment. I will send her information on the [facility] capsule endoscopy.

## 2018-02-07 ENCOUNTER — Other Ambulatory Visit: Payer: Self-pay

## 2018-02-07 DIAGNOSIS — D509 Iron deficiency anemia, unspecified: Secondary | ICD-10-CM

## 2018-02-11 ENCOUNTER — Other Ambulatory Visit: Payer: Self-pay

## 2018-02-11 NOTE — Patient Outreach (Signed)
[place]) Care Management  02/11/2018  Gabriella Howe [birth year] [id number]   Medication Adherence call to Gabriella Howe spoke with patient she did not want to engage and hung up the phone patient is due on four of her medication Benazepril 20 mg, Glipizide ER 5 mg, Simvastatin 40 mg and metformin 1000 mg under [place].   [place] Management Direct Dial [phone]  Fax [phone] [name]

## 2018-02-14 ENCOUNTER — Inpatient Hospital Stay (HOSPITAL_BASED_OUTPATIENT_CLINIC_OR_DEPARTMENT_OTHER): Payer: Medicare Other | Admitting: Oncology

## 2018-02-14 ENCOUNTER — Other Ambulatory Visit: Payer: Self-pay

## 2018-02-14 ENCOUNTER — Inpatient Hospital Stay: Payer: Medicare Other | Attending: Oncology

## 2018-02-14 ENCOUNTER — Encounter: Payer: Self-pay | Admitting: Oncology

## 2018-02-14 VITALS — BP 142/81 | HR 75 | Temp 98.2°F | Resp 18 | Ht 61.0 in | Wt 211.1 lb

## 2018-02-14 DIAGNOSIS — G8929 Other chronic pain: Secondary | ICD-10-CM

## 2018-02-14 DIAGNOSIS — E538 Deficiency of other specified B group vitamins: Secondary | ICD-10-CM | POA: Insufficient documentation

## 2018-02-14 DIAGNOSIS — D509 Iron deficiency anemia, unspecified: Secondary | ICD-10-CM

## 2018-02-14 DIAGNOSIS — M549 Dorsalgia, unspecified: Secondary | ICD-10-CM

## 2018-02-14 DIAGNOSIS — I10 Essential (primary) hypertension: Secondary | ICD-10-CM

## 2018-02-14 LAB — CBC WITH DIFFERENTIAL/PLATELET
ABS IMMATURE GRANULOCYTES: 0.02 10*3/uL (ref 0.00–0.07)
BASOS PCT: 1 %
Basophils Absolute: 0.1 10*3/uL (ref 0.0–0.1)
Eosinophils Absolute: 0.2 10*3/uL (ref 0.0–0.5)
Eosinophils Relative: 3 %
HCT: 42.5 % (ref 36.0–46.0)
Hemoglobin: 13.8 g/dL (ref 12.0–15.0)
IMMATURE GRANULOCYTES: 0 %
LYMPHS ABS: 1.9 10*3/uL (ref 0.7–4.0)
LYMPHS PCT: 30 %
MCH: 28.2 pg (ref 26.0–34.0)
MCHC: 32.5 g/dL (ref 30.0–36.0)
MCV: 86.7 fL (ref 80.0–100.0)
MONOS PCT: 6 %
Monocytes Absolute: 0.4 10*3/uL (ref 0.1–1.0)
NEUTROS ABS: 3.8 10*3/uL (ref 1.7–7.7)
NEUTROS PCT: 60 %
PLATELETS: 201 10*3/uL (ref 150–400)
RBC: 4.9 MIL/uL (ref 3.87–5.11)
RDW: 18.8 % — ABNORMAL HIGH (ref 11.5–15.5)
WBC: 6.4 10*3/uL (ref 4.0–10.5)
nRBC: 0 % (ref 0.0–0.2)

## 2018-02-14 LAB — IRON AND TIBC
Iron: 106 ug/dL (ref 28–170)
Saturation Ratios: 33 % — ABNORMAL HIGH (ref 10.4–31.8)
TIBC: 326 ug/dL (ref 250–450)
UIBC: 220 ug/dL

## 2018-02-14 LAB — FERRITIN: FERRITIN: 20 ng/mL (ref 11–307)

## 2018-02-14 NOTE — Progress Notes (Signed)
Hematology/Oncology Consult note [facility]  [phone] [phone]  Patient Care Team: Tower, Wynelle Fanny, MD as PCP - General Vin-Parikh, Deirdre Peer, MD as Referring Physician (Ophthalmology) Charmian Muff, DDS as Referring Physician (Dentistry)   Name of the patient: Gabriella Howe  [id number]  1934/09/01   Date of visit: 02/14/18  Diagnosis-iron deficiency anemia of unclear etiology  Chief complaint/ Reason for visit-routine follow-up of iron deficiency anemia  Heme/Onc history: patient is a 83 year old female with a past medical history significant for hyperlipidemia, type 2 diabetes, obstructive sleep apnea, chronic diastolic heart failure and history of kidney stones who is been referred to [place] for iron deficiency anemia.  Patient had a normal hemoglobin around 12 G January 2018 and then it started drifting down slowly from 11-10.  Most recent CBC on 12/04/2017 showed a hemoglobin of 10.22.4.  White count and platelet count was normal.  CMP was normal and ferritin was low at 4.4.  TSH was normal.  Patient did undergo EGD and colonoscopy by Dr. Silverio Decamp essentially normal colonoscopy did show polyps that was positive for tubular adenoma.  Patient did not want to pursue capsule endoscopy  Interval history-patient reports that her fatigue is somewhat better after receiving IV iron.  She still has some chronic back pain and occasional fatigue but overall doing well for age  ECOG PS- 1 Pain scale- 0 Opioid associated constipation- no  Review of systems- Review of Systems  Constitutional: Positive for malaise/fatigue. Negative for chills, fever and weight loss.  HENT: Negative for congestion, ear discharge and nosebleeds.   Eyes: Negative for blurred vision.  Respiratory: Negative for cough, hemoptysis, sputum production, shortness of breath and wheezing.   Cardiovascular: Negative for chest pain, palpitations, orthopnea and  claudication.  Gastrointestinal: Negative for abdominal pain, blood in stool, constipation, diarrhea, heartburn, melena, nausea and vomiting.  Genitourinary: Negative for dysuria, flank pain, frequency, hematuria and urgency.  Musculoskeletal: Positive for back pain. Negative for joint pain and myalgias.  Skin: Negative for rash.  Neurological: Negative for dizziness, tingling, focal weakness, seizures, weakness and headaches.  Endo/Heme/Allergies: Does not bruise/bleed easily.  Psychiatric/Behavioral: Negative for depression and suicidal ideas. The patient does not have insomnia.        Allergies  Allergen Reactions  . Rofecoxib     REACTION: reaction not known  . Tetanus-Diphtheria Toxoids Td     REACTION: sick with fever  . Antihistamines, Chlorpheniramine-Type Palpitations  . Sulfa Antibiotics Rash     Past Medical History:  Diagnosis Date  . Adenomatous colon polyp 2003  . Benign neoplasm of colon   . Carpal tunnel syndrome   . Chest pain, unspecified   . CHF (congestive heart failure) ([facility])    diastolic  . Degeneration of intervertebral disc, site unspecified   . Diabetes mellitus    type II  . Encounter for long-term (current) use of other medications   . Fluid overload   . GERD (gastroesophageal reflux disease)   . Heart disease, unspecified   . Hyperlipidemia   . Hypertension   . Kidney stone 3/12   hosp/ with stent  . Obesity, unspecified   . Rosacea   . Shortness of breath   . Unspecified asthma(493.90)   . Unspecified sleep apnea      Past Surgical History:  Procedure Laterality Date  . ABDOMINAL HYSTERECTOMY     partial, fibroids  . ESOPHAGOGASTRODUODENOSCOPY  2000  . UMBILICAL HERNIA REPAIR  01/2004  . URETERAL STENT  PLACEMENT  2/13   for L sided stone/ ecoli sepsis    Social History   Socioeconomic History  . Marital status: Widowed    Spouse name: Not on file  . Number of children: Not on file  . Years of education: Not on file  .  Highest education level: Not on file  Occupational History  . Not on file  Social Needs  . Financial resource strain: Not on file  . Food insecurity:    Worry: Not on file    Inability: Not on file  . Transportation needs:    Medical: Not on file    Non-medical: Not on file  Tobacco Use  . Smoking status: Never Smoker  . Smokeless tobacco: Never Used  Substance and Sexual Activity  . Alcohol use: No    Alcohol/week: 0.0 standard drinks  . Drug use: No  . Sexual activity: Never  Lifestyle  . Physical activity:    Days per week: Not on file    Minutes per session: Not on file  . Stress: Not on file  Relationships  . Social connections:    Talks on phone: Not on file    Gets together: Not on file    Attends religious service: Not on file    Active member of club or organization: Not on file    Attends meetings of clubs or organizations: Not on file    Relationship status: Not on file  . Intimate partner violence:    Fear of current or ex partner: Not on file    Emotionally abused: Not on file    Physically abused: Not on file    Forced sexual activity: Not on file  Other Topics Concern  . Not on file  Social History Narrative  . Not on file    Family History  Problem Relation Age of Onset  . Early death Father 64       train accident  . Diabetes Mother   . Heart disease Mother   . Diabetes Sister   . Diabetes Brother   . Colon cancer Brother   . Cancer Paternal Grandmother        unknown  . Diabetes Sister      Current Outpatient Medications:  .  Ascorbic Acid (VITAMIN C) 500 MG tablet, Take 500 mg by mouth daily.  , Disp: , Rfl:  .  Aspirin-Caffeine (BC FAST PAIN RELIEF PO), Take by mouth as directed.  , Disp: , Rfl:  .  benazepril (LOTENSIN) 20 MG tablet, Take 1 tablet (20 mg total) by mouth daily., Disp: 90 tablet, Rfl: 3 .  CALCIUM-VITAMIN D PO, Take 1 tablet by mouth 2 (two) times daily. , Disp: , Rfl:  .  furosemide (LASIX) 40 MG tablet, TAKE ONE-HALF  TABLET BY  MOUTH DAILY, Disp: 45 tablet, Rfl: 1 .  glipiZIDE (GLUCOTROL XL) 5 MG 24 hr tablet, Take 1 tablet (5 mg total) by mouth daily with breakfast., Disp: 90 tablet, Rfl: 3 .  glucose blood (BAYER CONTOUR TEST) test strip, Use to check blood sugar once daily as directed for DM (Dx. E11.9), Disp: 100 each, Rfl: 0 .  iron polysaccharides (NIFEREX) 150 MG capsule, Take 1 capsule (150 mg total) by mouth 2 (two) times daily., Disp: 180 capsule, Rfl: 1 .  Magnesium 250 MG TABS, Take by mouth daily., Disp: , Rfl:  .  metFORMIN (GLUCOPHAGE) 1000 MG tablet, TAKE 1 TABLET BY MOUTH TWO  TIMES DAILY WITH A MEAL, Disp: 180  tablet, Rfl: 3 .  MICROLET LANCETS MISC, Use to check blood sugar once daily as directed for DM (Dx. E11.9), Disp: 100 each, Rfl: 0 .  Multiple Vitamin (MULTIVITAMIN) capsule, Take 1 capsule by mouth daily.  , Disp: , Rfl:  .  Omega-3 Fatty Acids (FISH OIL PO), Take 1 capsule by mouth daily. , Disp: , Rfl:  .  omeprazole (PRILOSEC) 20 MG capsule, Take 1 capsule (20 mg total) by mouth daily., Disp: 90 capsule, Rfl: 3 .  simvastatin (ZOCOR) 40 MG tablet, Take 1 tablet (40 mg total) by mouth at bedtime., Disp: 90 tablet, Rfl: 3 .  vitamin E 400 UNIT capsule, Take 400 Units by mouth daily.  , Disp: , Rfl:  .  Pseudoephedrine HCl (SINUS & ALLERGY 12 HOUR PO), Take by mouth as directed.  , Disp: , Rfl:  .  triamcinolone cream (KENALOG) 0.1 %, Apply 1 application topically 2 (two) times daily. Apply to affected areas twice daily. (Patient not taking: Reported on 12/13/2017), Disp: 30 g, Rfl: 3  Current Facility-Administered Medications:  .  0.9 %  sodium chloride infusion, 500 mL, Intravenous, Once, Nandigam, Kavitha V, MD  Physical exam:  Vitals:   02/14/18 1122  BP: (!) 142/81  Pulse: 75  Resp: 18  Temp: 98.2 F (36.8 C)  TempSrc: Tympanic  SpO2: 93%  Weight: 211 lb 1.6 oz (95.8 kg)  Height: 5\' 1"  (1.549 m)   Physical Exam  Constitutional: She is oriented to person, place, and  time. She appears well-developed and well-nourished.  HENT:  Head: Normocephalic and atraumatic.  Eyes: Pupils are equal, round, and reactive to light. EOM are normal.  Neck: Normal range of motion.  Cardiovascular: Normal rate, regular rhythm and normal heart sounds.  Pulmonary/Chest: Effort normal and breath sounds normal.  Abdominal: Soft. Bowel sounds are normal.  Neurological: She is alert and oriented to person, place, and time.  Skin: Skin is warm and dry.     CMP Latest Ref Rng & Units 10/26/2017  Glucose 70 - 99 mg/dL 172(H)  BUN 6 - 23 mg/dL 22  Creatinine 0.40 - 1.20 mg/dL 0.75  Sodium 135 - 145 mEq/L 141  Potassium 3.5 - 5.1 mEq/L 4.4  Chloride 96 - 112 mEq/L 106  CO2 19 - 32 mEq/L 26  Calcium 8.4 - 10.5 mg/dL 9.7  Total Protein 6.0 - 8.3 g/dL 6.9  Total Bilirubin 0.2 - 1.2 mg/dL 0.9  Alkaline Phos 39 - 117 U/L 55  AST 0 - 37 U/L 15  ALT 0 - 35 U/L 12   CBC Latest Ref Rng & Units 02/14/2018  WBC 4.0 - 10.5 K/uL 6.4  Hemoglobin 12.0 - 15.0 g/dL 13.8  Hematocrit 36.0 - 46.0 % 42.5  Platelets 150 - 400 K/uL 201    No images are attached to the encounter.  No results found.   Assessment and plan- Patient is a 83 y.o. female with iron deficiency anemia of unclear etiology  Patient received 2 doses of Feraheme 2 months ago and her hemoglobin is improved from 10-13.8.  I do not think that she will need IV iron at this time.  However iron levels from today are pending and if they come back low we will let the patient know if she needs more IV iron.  Repeat CBC ferritin and iron studies in 3 in 6 months and I will see her back in 6 months.  Patient will stop taking oral iron at this time.  If she were  to become iron deficient in the future she will consider capsule endoscopy at that time  Patient's B12 levels were borderline low at 286.  She is presently taking oral B12 and we will repeat her B12 levels in 3 months time   Visit Diagnosis 1. Iron deficiency anemia,  unspecified iron deficiency anemia type      Dr. Randa Evens, MD, MPH [facility] [phone] 02/14/2018 12:26 PM

## 2018-02-14 NOTE — Progress Notes (Signed)
No new changes noted today 

## 2018-03-31 DIAGNOSIS — N200 Calculus of kidney: Secondary | ICD-10-CM | POA: Diagnosis not present

## 2018-04-04 DIAGNOSIS — R5383 Other fatigue: Secondary | ICD-10-CM | POA: Diagnosis not present

## 2018-04-16 ENCOUNTER — Other Ambulatory Visit: Payer: Self-pay | Admitting: Family Medicine

## 2018-04-28 ENCOUNTER — Telehealth: Payer: Self-pay | Admitting: Family Medicine

## 2018-04-28 DIAGNOSIS — E1169 Type 2 diabetes mellitus with other specified complication: Secondary | ICD-10-CM

## 2018-04-28 DIAGNOSIS — E119 Type 2 diabetes mellitus without complications: Secondary | ICD-10-CM

## 2018-04-28 DIAGNOSIS — D509 Iron deficiency anemia, unspecified: Secondary | ICD-10-CM

## 2018-04-28 DIAGNOSIS — E785 Hyperlipidemia, unspecified: Secondary | ICD-10-CM

## 2018-04-28 DIAGNOSIS — I10 Essential (primary) hypertension: Secondary | ICD-10-CM

## 2018-04-28 NOTE — Telephone Encounter (Signed)
-----   Message from Eustace Pen, LPN sent at 2/57/5051  3:44 PM EST ----- Regarding: Labs 2/6 Lab orders needed. Thank you.

## 2018-05-02 ENCOUNTER — Ambulatory Visit: Payer: Medicare Other

## 2018-05-06 ENCOUNTER — Ambulatory Visit (INDEPENDENT_AMBULATORY_CARE_PROVIDER_SITE_OTHER): Payer: Medicare Other

## 2018-05-06 VITALS — BP 124/82 | HR 73 | Temp 97.8°F | Ht 62.5 in | Wt 205.8 lb

## 2018-05-06 DIAGNOSIS — E785 Hyperlipidemia, unspecified: Secondary | ICD-10-CM

## 2018-05-06 DIAGNOSIS — Z0000 Encounter for general adult medical examination without abnormal findings: Secondary | ICD-10-CM | POA: Diagnosis not present

## 2018-05-06 DIAGNOSIS — E119 Type 2 diabetes mellitus without complications: Secondary | ICD-10-CM | POA: Diagnosis not present

## 2018-05-06 DIAGNOSIS — D509 Iron deficiency anemia, unspecified: Secondary | ICD-10-CM | POA: Diagnosis not present

## 2018-05-06 DIAGNOSIS — I10 Essential (primary) hypertension: Secondary | ICD-10-CM

## 2018-05-06 DIAGNOSIS — E1169 Type 2 diabetes mellitus with other specified complication: Secondary | ICD-10-CM | POA: Diagnosis not present

## 2018-05-06 DIAGNOSIS — Z23 Encounter for immunization: Secondary | ICD-10-CM | POA: Diagnosis not present

## 2018-05-06 LAB — COMPREHENSIVE METABOLIC PANEL
ALT: 16 U/L (ref 0–35)
AST: 19 U/L (ref 0–37)
Albumin: 4.3 g/dL (ref 3.5–5.2)
Alkaline Phosphatase: 60 U/L (ref 39–117)
BUN: 17 mg/dL (ref 6–23)
CO2: 29 meq/L (ref 19–32)
Calcium: 10 mg/dL (ref 8.4–10.5)
Chloride: 106 mEq/L (ref 96–112)
Creatinine, Ser: 0.77 mg/dL (ref 0.40–1.20)
GFR: 71.86 mL/min (ref >60.00)
GLUCOSE: 159 mg/dL — AB (ref 70–99)
Potassium: 4.5 mEq/L (ref 3.5–5.1)
SODIUM: 143 meq/L (ref 135–145)
Total Bilirubin: 1.1 mg/dL (ref 0.2–1.2)
Total Protein: 7 g/dL (ref 6.0–8.3)

## 2018-05-06 LAB — CBC WITH DIFFERENTIAL/PLATELET
Basophils Absolute: 0.1 10*3/uL (ref 0.0–0.1)
Basophils Relative: 0.9 % (ref 0.0–3.0)
EOS ABS: 0.2 10*3/uL (ref 0.0–0.7)
Eosinophils Relative: 2.8 % (ref 0.0–5.0)
HCT: 44.9 % (ref 36.0–46.0)
Hemoglobin: 15.1 g/dL — ABNORMAL HIGH (ref 12.0–15.0)
Lymphocytes Relative: 28 % (ref 12.0–46.0)
Lymphs Abs: 1.9 10*3/uL (ref 0.7–4.0)
MCHC: 33.6 g/dL (ref 30.0–36.0)
MCV: 92.4 fl (ref 78.0–100.0)
MONO ABS: 0.4 10*3/uL (ref 0.1–1.0)
Monocytes Relative: 5.8 % (ref 3.0–12.0)
NEUTROS PCT: 62.5 % (ref 43.0–77.0)
Neutro Abs: 4.2 10*3/uL (ref 1.4–7.7)
Platelets: 196 10*3/uL (ref 150.0–400.0)
RBC: 4.86 Mil/uL (ref 3.87–5.11)
RDW: 14.7 % (ref 11.5–15.5)
WBC: 6.7 10*3/uL (ref 4.0–10.5)

## 2018-05-06 LAB — HEMOGLOBIN A1C: HEMOGLOBIN A1C: 6.9 % — AB (ref 4.6–6.5)

## 2018-05-06 LAB — LIPID PANEL
Cholesterol: 123 mg/dL (ref 0–200)
HDL: 50 mg/dL (ref >39.00)
LDL Cholesterol: 50 mg/dL (ref 0–99)
NONHDL: 72.77
Total CHOL/HDL Ratio: 2
Triglycerides: 115 mg/dL (ref 0.0–149.0)
VLDL: 23 mg/dL (ref 0.0–40.0)

## 2018-05-06 LAB — TSH: TSH: 0.99 u[IU]/mL (ref 0.35–4.50)

## 2018-05-06 NOTE — Patient Instructions (Signed)
[name] , Thank you for taking time to come for your Medicare Wellness Visit. I appreciate your ongoing commitment to your health goals. Please review the following plan we discussed and let me know if I can assist you in the future.   These are the goals we discussed: Goals    . DIET - INCREASE WATER INTAKE     Starting 05/06/2018, I will attempt to drink at least 6 glasses of water daily and to continue to make healthy food choices.        This is a list of the screening recommended for you and due dates:  Health Maintenance  Topic Date Due  . Flu Shot  10/25/2017  . Hemoglobin A1C  04/28/2018  . Complete foot exam   04/30/2018  . Eye exam for diabetics  08/29/2018  . DEXA scan (bone density measurement)  Completed  . Pneumonia vaccines  Completed   Preventive Care for Adults  A healthy lifestyle and preventive care can promote health and wellness. Preventive health guidelines for adults include the following key practices.  . A routine yearly physical is a good way to check with your health care provider about your health and preventive screening. It is a chance to share any concerns and updates on your health and to receive a thorough exam.  . Visit your dentist for a routine exam and preventive care every 6 months. Brush your teeth twice a day and floss once a day. Good oral hygiene prevents tooth decay and gum disease.  . The frequency of eye exams is based on your age, health, family medical history, use  of contact lenses, and other factors. Follow your health care provider's recommendations for frequency of eye exams.  . Eat a healthy diet. Foods like vegetables, fruits, whole grains, low-fat dairy products, and lean protein foods contain the nutrients you need without too many calories. Decrease your intake of foods high in solid fats, added sugars, and salt. Eat the right amount of calories for you. Get information about a proper diet from your health care provider, if  necessary.  . Regular physical exercise is one of the most important things you can do for your health. Most adults should get at least 150 minutes of moderate-intensity exercise (any activity that increases your heart rate and causes you to sweat) each week. In addition, most adults need muscle-strengthening exercises on 2 or more days a week.  Silver Sneakers may be a benefit available to you. To determine eligibility, you may visit the website: www.silversneakers.com or contact program at [phone] Mon-Fri between 8AM-8PM.   . Maintain a healthy weight. The body mass index (BMI) is a screening tool to identify possible weight problems. It provides an estimate of body fat based on height and weight. Your health care provider can find your BMI and can help you achieve or maintain a healthy weight.   For adults 20 years and older: ? A BMI below 18.5 is considered underweight. ? A BMI of 18.5 to 24.9 is normal. ? A BMI of 25 to 29.9 is considered overweight. ? A BMI of 30 and above is considered obese.   . Maintain normal blood lipids and cholesterol levels by exercising and minimizing your intake of saturated fat. Eat a balanced diet with plenty of fruit and vegetables. Blood tests for lipids and cholesterol should begin at age 81 and be repeated every 5 years. If your lipid or cholesterol levels are high, you are over 50, or you  are at high risk for heart disease, you may need your cholesterol levels checked more frequently. Ongoing high lipid and cholesterol levels should be treated with medicines if diet and exercise are not working.  . If you smoke, find out from your health care provider how to quit. If you do not use tobacco, please do not start.  . If you choose to drink alcohol, please do not consume more than 2 drinks per day. One drink is considered to be 12 ounces (355 mL) of beer, 5 ounces (148 mL) of wine, or 1.5 ounces (44 mL) of liquor.  . If you are 66-51 years old, ask your  health care provider if you should take aspirin to prevent strokes.  . Use sunscreen. Apply sunscreen liberally and repeatedly throughout the day. You should seek shade when your shadow is shorter than you. Protect yourself by wearing long sleeves, pants, a wide-brimmed hat, and sunglasses year round, whenever you are outdoors.  . Once a month, do a whole body skin exam, using a mirror to look at the skin on your back. Tell your health care provider of new moles, moles that have irregular borders, moles that are larger than a pencil eraser, or moles that have changed in shape or color.

## 2018-05-06 NOTE — Progress Notes (Signed)
Subjective:   Gabriella Howe is a 84 y.o. female who presents for Medicare Annual (Subsequent) preventive examination.  Review of Systems:  N/A Cardiac Risk Factors include: advanced age (>61men, >15 women);obesity (BMI >30kg/m2);diabetes mellitus;dyslipidemia;hypertension     Objective:     Vitals: BP 124/82 (BP Location: Right Arm, Patient Position: Sitting, Cuff Size: Normal)   Pulse 73   Temp 97.8 F (36.6 C) (Oral)   Ht 5' 2.5" (1.588 m) Comment: shoes  Wt 205 lb 12 oz (93.3 kg)   SpO2 95%   BMI 37.03 kg/m   Body mass index is 37.03 kg/m.  Advanced Directives 05/06/2018 02/14/2018 12/13/2017 09/05/2017 04/23/2017 04/20/2016 02/05/2016  Does Patient Have a Medical Advance Directive? Yes No Yes Yes Yes Yes Yes  Type of Paramedic of [place];Living will - [place];Living will [place];Living will [place];Living will [place];Living will [place];Living will  Does patient want to make changes to medical advance directive? - - No - Patient declined - - - No - Patient declined  Copy of [place] in Chart? No - copy requested - Yes - No - copy requested Yes Yes  Would patient like information on creating a medical advance directive? - No - Patient declined - - - - -    Tobacco Social History   Tobacco Use  Smoking Status Never Smoker  Smokeless Tobacco Never Used     Counseling given: No   Clinical Intake:  Pre-visit preparation completed: Yes  Pain : No/denies pain Pain Score: 0-No pain     Nutritional Status: BMI > 30  Obese Nutritional Risks: None Diabetes: Yes CBG done?: No Did pt. bring in CBG monitor from home?: No  How often do you need to have someone help you when you read instructions, pamphlets, or other written materials from your doctor or pharmacy?: 1 - Never What is the last grade level you completed in  school?: 12th grade  Interpreter Needed?: No  Comments: pt is a widow and lives alone Information entered by :: LPinson, LPN  Past Medical History:  Diagnosis Date  . Adenomatous colon polyp 2003  . Benign neoplasm of colon   . Carpal tunnel syndrome   . Chest pain, unspecified   . CHF (congestive heart failure) (HCC)    diastolic  . Degeneration of intervertebral disc, site unspecified   . Diabetes mellitus    type II  . Encounter for long-term (current) use of other medications   . Fluid overload   . GERD (gastroesophageal reflux disease)   . Heart disease, unspecified   . Hyperlipidemia   . Hypertension   . Kidney stone 3/12   hosp/ with stent  . Obesity, unspecified   . Rosacea   . Shortness of breath   . Unspecified asthma(493.90)   . Unspecified sleep apnea    Past Surgical History:  Procedure Laterality Date  . ABDOMINAL HYSTERECTOMY     partial, fibroids  . ESOPHAGOGASTRODUODENOSCOPY  2000  . UMBILICAL HERNIA REPAIR  01/2004  . URETERAL STENT PLACEMENT  2/13   for L sided stone/ ecoli sepsis   Family History  Problem Relation Age of Onset  . Early death Father 79       train accident  . Diabetes Mother   . Heart disease Mother   . Diabetes Sister   . Diabetes Brother   . Colon cancer Brother   .  Cancer Paternal Grandmother        unknown  . Diabetes Sister    Social History   Socioeconomic History  . Marital status: Widowed    Spouse name: Not on file  . Number of children: Not on file  . Years of education: Not on file  . Highest education level: Not on file  Occupational History  . Not on file  Social Needs  . Financial resource strain: Not on file  . Food insecurity:    Worry: Not on file    Inability: Not on file  . Transportation needs:    Medical: Not on file    Non-medical: Not on file  Tobacco Use  . Smoking status: Never Smoker  . Smokeless tobacco: Never Used  Substance and Sexual Activity  . Alcohol use: No     Alcohol/week: 0.0 standard drinks  . Drug use: No  . Sexual activity: Never  Lifestyle  . Physical activity:    Days per week: Not on file    Minutes per session: Not on file  . Stress: Not on file  Relationships  . Social connections:    Talks on phone: Not on file    Gets together: Not on file    Attends religious service: Not on file    Active member of club or organization: Not on file    Attends meetings of clubs or organizations: Not on file    Relationship status: Not on file  Other Topics Concern  . Not on file  Social History Narrative  . Not on file    Outpatient Encounter Medications as of 05/06/2018  Medication Sig  . Ascorbic Acid (VITAMIN C) 500 MG tablet Take 500 mg by mouth daily.    . Aspirin-Caffeine (BC FAST PAIN RELIEF PO) Take by mouth as directed.    . benazepril (LOTENSIN) 20 MG tablet TAKE 1 TABLET BY MOUTH  DAILY  . CALCIUM-VITAMIN D PO Take 1 tablet by mouth 2 (two) times daily.   . Cyanocobalamin (B-12 PO) Take 1 tablet by mouth daily.  . furosemide (LASIX) 40 MG tablet TAKE ONE-HALF TABLET BY  MOUTH DAILY  . glipiZIDE (GLUCOTROL XL) 5 MG 24 hr tablet TAKE 1 TABLET BY MOUTH  DAILY WITH BREAKFAST  . glucose blood (BAYER CONTOUR TEST) test strip Use to check blood sugar once daily as directed for DM (Dx. E11.9)  . Magnesium 250 MG TABS Take by mouth daily.  . metFORMIN (GLUCOPHAGE) 1000 MG tablet TAKE 1 TABLET BY MOUTH TWO  TIMES DAILY WITH A MEAL  . MICROLET LANCETS MISC Use to check blood sugar once daily as directed for DM (Dx. E11.9)  . Multiple Vitamin (MULTIVITAMIN) capsule Take 1 capsule by mouth daily.    . Omega-3 Fatty Acids (FISH OIL PO) Take 1 capsule by mouth daily.   Marland Kitchen omeprazole (PRILOSEC) 20 MG capsule TAKE 1 CAPSULE BY MOUTH  DAILY  . Pseudoephedrine HCl (SINUS & ALLERGY 12 HOUR PO) Take by mouth as directed.    . simvastatin (ZOCOR) 40 MG tablet TAKE 1 TABLET BY MOUTH AT  BEDTIME  . triamcinolone cream (KENALOG) 0.1 % Apply 1  application topically 2 (two) times daily. Apply to affected areas twice daily.  . vitamin E 400 UNIT capsule Take 400 Units by mouth daily.    . [DISCONTINUED] iron polysaccharides (NIFEREX) 150 MG capsule Take 1 capsule (150 mg total) by mouth 2 (two) times daily.   Facility-Administered Encounter Medications as of 05/06/2018  Medication  .  0.9 %  sodium chloride infusion    Activities of Daily Living In your present state of health, do you have any difficulty performing the following activities: 05/06/2018  Hearing? N  Vision? N  Difficulty concentrating or making decisions? N  Walking or climbing stairs? Y  Dressing or bathing? N  Doing errands, shopping? N  Preparing Food and eating ? N  Using the Toilet? N  In the past six months, have you accidently leaked urine? N  Do you have problems with loss of bowel control? N  Managing your Medications? N  Managing your Finances? N  Housekeeping or managing your Housekeeping? N  Some recent data might be hidden    Patient Care Team: Tower, Wynelle Fanny, MD as PCP - General Vin-Parikh, Deirdre Peer, MD as Referring Physician (Ophthalmology) Charmian Muff, DDS as Referring Physician (Dentistry)    Assessment:   This is a routine wellness examination for Dmya.   Hearing Screening   125Hz  250Hz  500Hz  1000Hz  2000Hz  3000Hz  4000Hz  6000Hz  8000Hz   Right ear:   0 0 40  40    Left ear:   0 0 40  0    Vision Screening Comments: Vision exam in June 2019 with Dr. Sandra Cockayne   Exercise Activities and Dietary recommendations Current Exercise Habits: The patient does not participate in regular exercise at present, Exercise limited by: None identified  Goals    . DIET - INCREASE WATER INTAKE     Starting 05/06/2018, I will attempt to drink at least 6 glasses of water daily and to continue to make healthy food choices.        Fall Risk Fall Risk  05/06/2018 12/24/2017 04/23/2017 04/20/2016 04/14/2015  Falls in the past year? 0 - No No No    Risk for fall due to : - Impaired balance/gait - - -   Depression Screen PHQ 2/9 Scores 05/06/2018 04/23/2017 04/20/2016 04/14/2015  PHQ - 2 Score 0 1 1 0  PHQ- 9 Score 0 1 - -     Cognitive Function MMSE - Mini Mental State Exam 05/06/2018 04/23/2017 04/20/2016  Orientation to time 5 5 5   Orientation to Place 5 5 5   Registration 3 3 3   Attention/ Calculation 0 0 0  Recall 2 3 2   Recall-comments unable to recall 2 of 3 words - pt was unable to recall 1 of 3 words  Language- name 2 objects 0 0 0  Language- repeat 1 1 1   Language- follow 3 step command 3 3 3   Language- read & follow direction 0 0 0  Write a sentence 0 0 0  Copy design 0 0 0  Total score 19 20 19      PLEASE NOTE: A Mini-Cog screen was completed. Maximum score is 20. A value of 0 denotes this part of [place] MMSE was not completed or the patient failed this part of the Mini-Cog screening.   Mini-Cog Screening Orientation to Time - Max 5 pts Orientation to Place - Max 5 pts Registration - Max 3 pts Recall - Max 3 pts Language Repeat - Max 1 pts Language Follow 3 Step Command - Max 3 pts     Immunization History  Administered Date(s) Administered  . Influenza Split 01/24/2011  . Influenza Whole 05/10/2009  . Influenza,inj,Quad PF,6+ Mos 11/28/2013, 04/14/2015, 04/20/2016, 01/26/2017, 05/06/2018  . Pneumococcal Conjugate-13 11/28/2013  . Pneumococcal Polysaccharide-23 03/28/2003    Screening Tests Health Maintenance  Topic Date Due  . FOOT EXAM  05/07/2018 (Originally 04/30/2018)  .  OPHTHALMOLOGY EXAM  08/29/2018  . HEMOGLOBIN A1C  11/04/2018  . INFLUENZA VACCINE  Completed  . DEXA SCAN  Completed  . PNA vac Low Risk Adult  Completed      Plan:     I have personally reviewed, addressed, and noted the following in the patient's chart:  A. Medical and social history B. Use of alcohol, tobacco or illicit drugs  C. Current medications and supplements D. Functional ability and status E.  Nutritional  status F.  Physical activity G. Advance directives H. List of other physicians I.  Hospitalizations, surgeries, and ER visits in previous 12 months J.  [place] to include hearing, vision, cognitive, depression L. Referrals and appointments - none  In addition, I have reviewed and discussed with patient certain preventive protocols, quality metrics, and best practice recommendations. A written personalized care plan for preventive services as well as general preventive health recommendations were provided to patient.  See attached scanned questionnaire for additional information.   Signed,   Lindell Noe, MHA, BS, LPN Health Coach

## 2018-05-06 NOTE — Progress Notes (Signed)
PCP notes:   Health maintenance:  Foot exam - PCP follow-up requested A1C - completed Flu vaccine - administered  Abnormal screenings:   Hearing - failed  Hearing Screening   125Hz  250Hz  500Hz  1000Hz  2000Hz  3000Hz  4000Hz  6000Hz  8000Hz   Right ear:   0 0 40  40    Left ear:   0 0 40  0     Patient concerns:   None  Nurse concerns:  None  Next PCP appt:   05/07/18 @ 0930 I reviewed health advisor's note, was available for consultation, and agree with documentation and plan. Loura Pardon MD

## 2018-05-07 ENCOUNTER — Ambulatory Visit (INDEPENDENT_AMBULATORY_CARE_PROVIDER_SITE_OTHER): Payer: Medicare Other | Admitting: Family Medicine

## 2018-05-07 ENCOUNTER — Encounter: Payer: Self-pay | Admitting: Family Medicine

## 2018-05-07 VITALS — BP 128/84 | HR 68 | Temp 97.7°F | Ht 62.5 in | Wt 205.8 lb

## 2018-05-07 DIAGNOSIS — I10 Essential (primary) hypertension: Secondary | ICD-10-CM

## 2018-05-07 DIAGNOSIS — I5032 Chronic diastolic (congestive) heart failure: Secondary | ICD-10-CM | POA: Diagnosis not present

## 2018-05-07 DIAGNOSIS — E785 Hyperlipidemia, unspecified: Secondary | ICD-10-CM

## 2018-05-07 DIAGNOSIS — E1169 Type 2 diabetes mellitus with other specified complication: Secondary | ICD-10-CM

## 2018-05-07 DIAGNOSIS — Z0000 Encounter for general adult medical examination without abnormal findings: Secondary | ICD-10-CM

## 2018-05-07 DIAGNOSIS — D509 Iron deficiency anemia, unspecified: Secondary | ICD-10-CM

## 2018-05-07 DIAGNOSIS — E119 Type 2 diabetes mellitus without complications: Secondary | ICD-10-CM

## 2018-05-07 MED ORDER — OMEPRAZOLE 20 MG PO CPDR: 20.0000 mg | DELAYED_RELEASE_CAPSULE | Freq: Every day | ORAL | 3 refills | Status: DC

## 2018-05-07 MED ORDER — GLIPIZIDE ER 5 MG PO TB24: 5.0000 mg | ORAL_TABLET | Freq: Every day | ORAL | 3 refills | Status: DC

## 2018-05-07 MED ORDER — METFORMIN HCL 1000 MG PO TABS: 1000.0000 mg | ORAL_TABLET | Freq: Two times a day (BID) | ORAL | 3 refills | Status: DC

## 2018-05-07 MED ORDER — FUROSEMIDE 40 MG PO TABS: 20.0000 mg | ORAL_TABLET | Freq: Every day | ORAL | 3 refills | Status: DC

## 2018-05-07 MED ORDER — BENAZEPRIL HCL 20 MG PO TABS: 20.0000 mg | ORAL_TABLET | Freq: Every day | ORAL | 3 refills | Status: DC

## 2018-05-07 MED ORDER — SIMVASTATIN 40 MG PO TABS: 40.0000 mg | ORAL_TABLET | Freq: Every day | ORAL | 3 refills | Status: DC

## 2018-05-07 NOTE — Assessment & Plan Note (Signed)
Doing well  No clinical changes  

## 2018-05-07 NOTE — Assessment & Plan Note (Signed)
Reviewed health habits including diet and exercise and skin cancer prevention Reviewed appropriate screening tests for age  Also reviewed health mt list, fam hx and immunization status , as well as social and family history    amw rev  Declines further hearing eval Declines dexa  Declines any breast cancer screening  Labs reviewed  shingrix discussed  Wt loss enc  Disc ca and D for bone health

## 2018-05-07 NOTE — Assessment & Plan Note (Signed)
Lab Results  Component Value Date   HGBA1C 6.9 (H) 05/06/2018   Improved Metformin and glipizide  utd eye care  Strongly enc wt loss

## 2018-05-07 NOTE — Assessment & Plan Note (Signed)
Improved much after heme f/u and iron infusion Pt does not want to return to heme unless Hb falls due to high co pay

## 2018-05-07 NOTE — Assessment & Plan Note (Signed)
Stable with simvastatin and diet  Disc goals for lipids and reasons to control them Rev last labs with pt Rev low sat fat diet in detail

## 2018-05-07 NOTE — Progress Notes (Signed)
Subjective:    Patient ID: Gabriella Howe, female    DOB: 07-13-1934, 84 y.o.   MRN: [id number]  HPI Here for health maintenance exam and to review chronic medical problems    She had the flu - took about 3 weeks to get back to normal  Glad she got better    Had amw yesterday  Given flu vaccine Failed hearing screen -not ready to investigate hearing aides She does not have any problems that she notices   dexa 1/01 Not interested in another one No falls or fractures  Takes vit D and calcium  Exercise- has a lot of stairs at home- goes up frequently  Lifts 20 lb bags of cat and bird feed - to stay strong   Declines breast cancer screening  Self exam-normal Declines breast exam here   Had a colonoscopy 6/19 -adenoma  No recall planned due to age (pt may consider)  Brother had colon cancer   Zoster status - may be interested in shingrix   Wt Readings from Last 3 Encounters:  05/07/18 205 lb 12 oz (93.3 kg)  05/06/18 205 lb 12 oz (93.3 kg)  02/14/18 211 lb 1.6 oz (95.8 kg)  down 6 lb - from being sick for 3 weeks  Eats a lot better than the past  Stays active  37.03 kg/m   bp is stable today  No cp or palpitations or headaches or edema  No side effects to medicines  BP Readings from Last 3 Encounters:  05/07/18 128/84  05/06/18 124/82  02/14/18 (!) 142/81     H/o DD Also sleep apnea  DM2 Lab Results  Component Value Date   HGBA1C 6.9 (H) 05/06/2018  this is down from 7.1 in august  On metformin and glipizide utd eye and foot care  Ace for renal protection  Doing a little better avoiding sugar/sweets   Back pain has improved   Hyperlipidemia Lab Results  Component Value Date   CHOL 123 05/06/2018   CHOL 119 10/26/2017   CHOL 121 04/23/2017   Lab Results  Component Value Date   HDL 50.00 05/06/2018   HDL 54.60 10/26/2017   HDL 53.60 04/23/2017   Lab Results  Component Value Date   LDLCALC 50 05/06/2018   LDLCALC 35 10/26/2017   LDLCALC 44  04/23/2017   Lab Results  Component Value Date   TRIG 115.0 05/06/2018   TRIG 148.0 10/26/2017   TRIG 118.0 04/23/2017   Lab Results  Component Value Date   CHOLHDL 2 05/06/2018   CHOLHDL 2 10/26/2017   CHOLHDL 2 04/23/2017   No results found for: LDLDIRECT Taking simvastatin and watching diet  Does not eat red meat  No greasy food    Iron def anemia  Unknown source  Lab Results  Component Value Date   WBC 6.7 05/06/2018   HGB 15.1 (H) 05/06/2018   HCT 44.9 05/06/2018   MCV 92.4 05/06/2018   PLT 196.0 05/06/2018   Lab Results  Component Value Date   FERRITIN 20 02/14/2018  no longer taking iron  Still taking B12  Feels pretty well  Went to hematology  Got iron infusion -helped but does not think she will f/u due to high copay unless hb drops   Lab Results  Component Value Date   CREATININE 0.77 05/06/2018   BUN 17 05/06/2018   NA 143 05/06/2018   K 4.5 05/06/2018   CL 106 05/06/2018   CO2 29 05/06/2018   Lab  Results  Component Value Date   ALT 16 05/06/2018   AST 19 05/06/2018   ALKPHOS 60 05/06/2018   BILITOT 1.1 05/06/2018   Lab Results  Component Value Date   TSH 0.99 05/06/2018    Patient Active Problem List   Diagnosis Date Noted  . Somnolence, daytime 07/31/2017  . Iron deficiency anemia 04/30/2017  . History of shingles 10/03/2016  . Routine general medical examination at a health care facility 04/14/2015  . Back pain 07/28/2014  . Knee pain 07/28/2014  . Encounter for Medicare annual wellness exam 11/19/2012  . History of kidney stones 11/19/2012  . DIASTOLIC HEART FAILURE, CHRONIC 07/29/2009  . Hyperlipidemia associated with type 2 diabetes mellitus ([place]) 09/13/2006  . CARPAL TUNNEL SYNDROME 09/10/2006  . Essential hypertension 09/10/2006  . DIASTOLIC DYSFUNCTION 09/98/3382  . REACTIVE AIRWAY DISEASE 09/10/2006  . GERD 09/10/2006  . ROSACEA 09/10/2006  . Degeneration of lumbar or lumbosacral intervertebral disc 09/10/2006  . OSA  (obstructive sleep apnea) 09/10/2006  . COLONIC POLYPS, HX OF 09/10/2006  . Diabetes type 2, controlled ([place]) 09/06/2006   Past Medical History:  Diagnosis Date  . Adenomatous colon polyp 2003  . Benign neoplasm of colon   . Carpal tunnel syndrome   . Chest pain, unspecified   . CHF (congestive heart failure) (HCC)    diastolic  . Degeneration of intervertebral disc, site unspecified   . Diabetes mellitus    type II  . Encounter for long-term (current) use of other medications   . Fluid overload   . GERD (gastroesophageal reflux disease)   . Heart disease, unspecified   . Hyperlipidemia   . Hypertension   . Kidney stone 3/12   hosp/ with stent  . Obesity, unspecified   . Rosacea   . Shortness of breath   . Unspecified asthma(493.90)   . Unspecified sleep apnea    Past Surgical History:  Procedure Laterality Date  . ABDOMINAL HYSTERECTOMY     partial, fibroids  . ESOPHAGOGASTRODUODENOSCOPY  2000  . UMBILICAL HERNIA REPAIR  01/2004  . URETERAL STENT PLACEMENT  2/13   for L sided stone/ ecoli sepsis   Social History   Tobacco Use  . Smoking status: Never Smoker  . Smokeless tobacco: Never Used  Substance Use Topics  . Alcohol use: No    Alcohol/week: 0.0 standard drinks  . Drug use: No   Family History  Problem Relation Age of Onset  . Early death Father 57       train accident  . Diabetes Mother   . Heart disease Mother   . Diabetes Sister   . Diabetes Brother   . Colon cancer Brother   . Cancer Paternal Grandmother        unknown  . Diabetes Sister    Allergies  Allergen Reactions  . Rofecoxib     REACTION: reaction not known  . Tetanus-Diphtheria Toxoids Td     REACTION: sick with fever  . Antihistamines, Chlorpheniramine-Type Palpitations  . Sulfa Antibiotics Rash   Current Outpatient Medications on File Prior to Visit  Medication Sig Dispense Refill  . Ascorbic Acid (VITAMIN C) 500 MG tablet Take 500 mg by mouth daily.      . Aspirin-Caffeine  (BC FAST PAIN RELIEF PO) Take by mouth as directed.      Marland Kitchen CALCIUM-VITAMIN D PO Take 1 tablet by mouth 2 (two) times daily.     . Cyanocobalamin (B-12 PO) Take 1 tablet by mouth daily.    Marland Kitchen  glucose blood (BAYER CONTOUR TEST) test strip Use to check blood sugar once daily as directed for DM (Dx. E11.9) 100 each 0  . Magnesium 250 MG TABS Take by mouth daily.    Marland Kitchen MICROLET LANCETS MISC Use to check blood sugar once daily as directed for DM (Dx. E11.9) 100 each 0  . Multiple Vitamin (MULTIVITAMIN) capsule Take 1 capsule by mouth daily.      . Omega-3 Fatty Acids (FISH OIL PO) Take 1 capsule by mouth daily.     . Pseudoephedrine HCl (SINUS & ALLERGY 12 HOUR PO) Take by mouth as directed.      . triamcinolone cream (KENALOG) 0.1 % Apply 1 application topically 2 (two) times daily. Apply to affected areas twice daily. 30 g 3  . vitamin E 400 UNIT capsule Take 400 Units by mouth daily.       Current Facility-Administered Medications on File Prior to Visit  Medication Dose Route Frequency Provider Last Rate Last Dose  . 0.9 %  sodium chloride infusion  500 mL Intravenous Once Nandigam, Kavitha V, MD         Review of Systems  Constitutional: Negative for activity change, appetite change, fatigue, fever and unexpected weight change.  HENT: Negative for congestion, ear pain, rhinorrhea, sinus pressure and sore throat.   Eyes: Negative for pain, redness and visual disturbance.  Respiratory: Negative for cough, shortness of breath and wheezing.   Cardiovascular: Negative for chest pain and palpitations.  Gastrointestinal: Negative for abdominal pain, blood in stool, constipation and diarrhea.  Endocrine: Negative for polydipsia and polyuria.  Genitourinary: Negative for dysuria, frequency and urgency.  Musculoskeletal: Positive for arthralgias and back pain. Negative for myalgias.  Skin: Negative for pallor and rash.  Allergic/Immunologic: Negative for environmental allergies.  Neurological:  Negative for dizziness, syncope and headaches.  Hematological: Negative for adenopathy. Does not bruise/bleed easily.  Psychiatric/Behavioral: Negative for decreased concentration and dysphoric mood. The patient is not nervous/anxious.        Objective:   Physical Exam Constitutional:      General: She is not in acute distress.    Appearance: Normal appearance. She is well-developed. She is obese. She is not ill-appearing.  HENT:     Head: Normocephalic and atraumatic.     Right Ear: Tympanic membrane, ear canal and external ear normal.     Left Ear: Tympanic membrane, ear canal and external ear normal.     Nose: Nose normal.     Mouth/Throat:     Mouth: Mucous membranes are moist.     Pharynx: Oropharynx is clear.  Eyes:     General: No scleral icterus.    Conjunctiva/sclera: Conjunctivae normal.     Pupils: Pupils are equal, round, and reactive to light.  Neck:     Musculoskeletal: Normal range of motion and neck supple.     Thyroid: No thyromegaly.     Vascular: No carotid bruit or JVD.  Cardiovascular:     Rate and Rhythm: Normal rate and regular rhythm.     Pulses: Normal pulses.     Heart sounds: Normal heart sounds. No gallop.   Pulmonary:     Effort: Pulmonary effort is normal. No respiratory distress.     Breath sounds: Normal breath sounds. No wheezing or rhonchi.  Chest:     Chest wall: No tenderness.  Abdominal:     General: Bowel sounds are normal. There is no distension or abdominal bruit.     Palpations: Abdomen is soft.  There is no mass.     Tenderness: There is no abdominal tenderness.  Genitourinary:    Comments: Declines breast exam Musculoskeletal: Normal range of motion.        General: No tenderness.     Right lower leg: No edema.     Left lower leg: No edema.  Lymphadenopathy:     Cervical: No cervical adenopathy.  Skin:    General: Skin is warm and dry.     Coloration: Skin is not pale.     Findings: No erythema or rash.     Comments: Solar  lentigines diffusely Also sks  Neurological:     General: No focal deficit present.     Mental Status: She is alert.     Cranial Nerves: No cranial nerve deficit.     Motor: No abnormal muscle tone.     Coordination: Coordination normal.     Deep Tendon Reflexes: Reflexes are normal and symmetric.  Psychiatric:        Mood and Affect: Mood normal.           Assessment & Plan:   Problem List Items Addressed This Visit      Cardiovascular and Mediastinum   Essential hypertension    bp in fair control at this time  BP Readings from Last 1 Encounters:  05/07/18 128/84   No changes needed Most recent labs reviewed  Disc lifstyle change with low sodium diet and exercise        Relevant Medications   benazepril (LOTENSIN) 20 MG tablet   furosemide (LASIX) 40 MG tablet   simvastatin (ZOCOR) 40 MG tablet   DIASTOLIC HEART FAILURE, CHRONIC    Doing well  No clinical changes       Relevant Medications   benazepril (LOTENSIN) 20 MG tablet   furosemide (LASIX) 40 MG tablet   simvastatin (ZOCOR) 40 MG tablet     Endocrine   Diabetes type 2, controlled (HCC)    Lab Results  Component Value Date   HGBA1C 6.9 (H) 05/06/2018   Improved Metformin and glipizide  utd eye care  Strongly enc wt loss       Relevant Medications   benazepril (LOTENSIN) 20 MG tablet   glipiZIDE (GLUCOTROL XL) 5 MG 24 hr tablet   metFORMIN (GLUCOPHAGE) 1000 MG tablet   simvastatin (ZOCOR) 40 MG tablet   Hyperlipidemia associated with type 2 diabetes mellitus (HCC)    Stable with simvastatin and diet  Disc goals for lipids and reasons to control them Rev last labs with pt Rev low sat fat diet in detail       Relevant Medications   benazepril (LOTENSIN) 20 MG tablet   glipiZIDE (GLUCOTROL XL) 5 MG 24 hr tablet   metFORMIN (GLUCOPHAGE) 1000 MG tablet   simvastatin (ZOCOR) 40 MG tablet     Other   Routine general medical examination at a health care facility - Primary    Reviewed  health habits including diet and exercise and skin cancer prevention Reviewed appropriate screening tests for age  Also reviewed health mt list, fam hx and immunization status , as well as social and family history    amw rev  Declines further hearing eval Declines dexa  Declines any breast cancer screening  Labs reviewed  shingrix discussed  Wt loss enc  Disc ca and D for bone health      Iron deficiency anemia    Improved much after heme f/u and iron infusion Pt does not  want to return to heme unless Hb falls due to high co pay

## 2018-05-07 NOTE — Assessment & Plan Note (Signed)
bp in fair control at this time  BP Readings from Last 1 Encounters:  05/07/18 128/84   No changes needed Most recent labs reviewed  Disc lifstyle change with low sodium diet and exercise

## 2018-05-07 NOTE — Patient Instructions (Addendum)
If you are interested in the new shingles vaccine (Shingrix) - call your local pharmacy to check on coverage and availability  If affordable, get on a wait list at your pharmacy to get the vaccine.  Keep taking good care of yourself   Follow up in 6 months   Try to get most of your carbohydrates from produce (with the exception of white potatoes)  Eat less bread/pasta/rice/snack foods/cereals/sweets and other items from the middle of the grocery store (processed carbs)

## 2018-05-17 ENCOUNTER — Other Ambulatory Visit: Payer: Medicare Other

## 2018-08-06 ENCOUNTER — Telehealth: Payer: Self-pay | Admitting: Family Medicine

## 2018-08-06 NOTE — Telephone Encounter (Signed)
Pt notified of Dr. Marliss Coots comments and verbalized understanding. She said she didn't think she hit her head really hard so she doesn't think she has a concussion just really sore. She will try the OTC meds Dr. Glori Bickers recommended and will keep [place] posted

## 2018-08-06 NOTE — Telephone Encounter (Signed)
Patient fell underneath her car port on asphalt. Patient hit her head on a plastic dog house.  She fell on her right side from her shoulder to her hip.  EMS came and helped her up and asked patient if she needed to go to the hospital and she said no.  She doesn't think that she broke anything. Patient said she's able to walk, but it's painful to walk and sit. Patient said she doesn't know how to use her phone for a virtual visit and patient said she can't come in to the office she'd need to have an ambulance bring her.  Patient wants to know if she can have pain medication.  She tried taking a BC, it helped some but she's still in a lot of pain.  Patient uses [facility].

## 2018-08-06 NOTE — Telephone Encounter (Signed)
With caution I think she can take one aleve (naproxen) with a meal twice daily  Caution as this can cause GI upset and also fluid retention Tylenol is ok in addition to that as directed She will need evaluation if not improved - I am concerned about injury   (if she needs to call 911 to get to a hospital that is ok ) If headache/dizziness or signs of concussion- needs to go to ER as well Please keep me posted

## 2018-08-12 ENCOUNTER — Other Ambulatory Visit: Payer: Self-pay

## 2018-08-12 NOTE — Patient Outreach (Signed)
[facility]) Care Management  08/12/2018  [name] 12/27/1934 [id number]   Medication Adherence call to Mrs. Gabriella Howe spoke with patient she did not want to engage patient is due on Simvastatin 40 mg,Benazepril 20 mg and Glipizide 5 mg under [facility].   [facility] Direct Dial [phone]  Fax [phone] [email or web address]

## 2018-08-15 ENCOUNTER — Ambulatory Visit: Payer: Medicare Other | Admitting: Oncology

## 2018-08-15 ENCOUNTER — Other Ambulatory Visit: Payer: Medicare Other

## 2018-08-16 ENCOUNTER — Telehealth: Payer: Self-pay

## 2018-08-16 DIAGNOSIS — E119 Type 2 diabetes mellitus without complications: Secondary | ICD-10-CM

## 2018-08-16 NOTE — Telephone Encounter (Signed)
Order done and in IN box  

## 2018-08-16 NOTE — Telephone Encounter (Signed)
Gerald Stabs with [facility] left v/m requesting new rx for accu chek aviva meter, test strips and lancets. Pt did not tell Gerald Stabs how often pt is testing so Gerald Stabs ask for new directions also. Pt last seen for annual on 05/07/18.

## 2018-08-20 NOTE — Telephone Encounter (Signed)
Order faxed.

## 2018-09-12 DIAGNOSIS — E119 Type 2 diabetes mellitus without complications: Secondary | ICD-10-CM | POA: Diagnosis not present

## 2018-09-12 LAB — HM DIABETES EYE EXAM

## 2018-09-24 ENCOUNTER — Encounter: Payer: Self-pay | Admitting: *Deleted

## 2018-11-01 ENCOUNTER — Telehealth: Payer: Self-pay | Admitting: Family Medicine

## 2018-11-01 DIAGNOSIS — E119 Type 2 diabetes mellitus without complications: Secondary | ICD-10-CM

## 2018-11-01 DIAGNOSIS — D509 Iron deficiency anemia, unspecified: Secondary | ICD-10-CM

## 2018-11-01 NOTE — Telephone Encounter (Signed)
Patient called in regards to her appointment on Tuesday 8/11 advised this was her 6 month f/u   She stated that if this is not for her to get blood work done then she isnt coming.  She does not feel like an appointment is needed, she feels fine  Patient would like to know if you could order blood work to check her iron and A1C  and we cancel her appointment with you.. just make a lab appointment

## 2018-11-03 NOTE — Telephone Encounter (Signed)
I ordered cbc and A1C for lab appt only Non fasting  If labs come back with unexpected results we can go from there

## 2018-11-04 NOTE — Telephone Encounter (Signed)
Pt called back and scheduled lab appt.  ?

## 2018-11-04 NOTE — Telephone Encounter (Signed)
I left a detailed message on patient's voice mail. I let her know I cancelled her appointment on 11/05/18 and for her to call back and schedule a lab appointment.

## 2018-11-05 ENCOUNTER — Ambulatory Visit: Payer: Medicare Other | Admitting: Family Medicine

## 2018-11-05 ENCOUNTER — Other Ambulatory Visit: Payer: Self-pay

## 2018-11-05 ENCOUNTER — Other Ambulatory Visit (INDEPENDENT_AMBULATORY_CARE_PROVIDER_SITE_OTHER): Payer: Medicare Other

## 2018-11-05 DIAGNOSIS — E119 Type 2 diabetes mellitus without complications: Secondary | ICD-10-CM | POA: Diagnosis not present

## 2018-11-05 DIAGNOSIS — D509 Iron deficiency anemia, unspecified: Secondary | ICD-10-CM

## 2018-11-05 LAB — CBC WITH DIFFERENTIAL/PLATELET
Basophils Absolute: 0 10*3/uL (ref 0.0–0.1)
Basophils Relative: 0.8 % (ref 0.0–3.0)
Eosinophils Absolute: 0.1 10*3/uL (ref 0.0–0.7)
Eosinophils Relative: 2.4 % (ref 0.0–5.0)
HCT: 43.6 % (ref 36.0–46.0)
Hemoglobin: 14.7 g/dL (ref 12.0–15.0)
Lymphocytes Relative: 24.4 % (ref 12.0–46.0)
Lymphs Abs: 1.5 10*3/uL (ref 0.7–4.0)
MCHC: 33.8 g/dL (ref 30.0–36.0)
MCV: 92.9 fl (ref 78.0–100.0)
Monocytes Absolute: 0.4 10*3/uL (ref 0.1–1.0)
Monocytes Relative: 6 % (ref 3.0–12.0)
Neutro Abs: 4.1 10*3/uL (ref 1.4–7.7)
Neutrophils Relative %: 66.4 % (ref 43.0–77.0)
Platelets: 193 10*3/uL (ref 150.0–400.0)
RBC: 4.69 Mil/uL (ref 3.87–5.11)
RDW: 13.1 % (ref 11.5–15.5)
WBC: 6.2 10*3/uL (ref 4.0–10.5)

## 2018-11-05 LAB — HEMOGLOBIN A1C: Hgb A1c MFr Bld: 7.1 % — ABNORMAL HIGH (ref 4.6–6.5)

## 2019-02-17 ENCOUNTER — Other Ambulatory Visit: Payer: Self-pay | Admitting: Family Medicine

## 2019-02-17 DIAGNOSIS — R21 Rash and other nonspecific skin eruption: Secondary | ICD-10-CM

## 2019-02-17 NOTE — Telephone Encounter (Signed)
CPE scheduled on 05/14/19, last filled on8/7/19 #30 g with 3 refills, please advise

## 2019-02-18 ENCOUNTER — Encounter
Admission: EM | Disposition: A | Discharge status: Home / Self Care | Payer: Self-pay | Source: Home / Self Care | Attending: Student in an Organized Health Care Education/Training Program

## 2019-02-18 ENCOUNTER — Observation Stay: Payer: Medicare Other | Admitting: Anesthesiology

## 2019-02-18 ENCOUNTER — Observation Stay
Admission: EM | Admit: 2019-02-18 | Discharge: 2019-02-20 | Disposition: A | Discharge status: Home / Self Care | Payer: Medicare Other | Attending: Urology | Admitting: Urology

## 2019-02-18 ENCOUNTER — Other Ambulatory Visit: Payer: Self-pay

## 2019-02-18 ENCOUNTER — Emergency Department: Payer: Medicare Other

## 2019-02-18 ENCOUNTER — Encounter: Payer: Self-pay | Admitting: Medical Oncology

## 2019-02-18 DIAGNOSIS — N10 Acute tubulo-interstitial nephritis: Secondary | ICD-10-CM

## 2019-02-18 DIAGNOSIS — Z86010 Personal history of colonic polyps: Secondary | ICD-10-CM | POA: Diagnosis not present

## 2019-02-18 DIAGNOSIS — E785 Hyperlipidemia, unspecified: Secondary | ICD-10-CM | POA: Insufficient documentation

## 2019-02-18 DIAGNOSIS — Z79899 Other long term (current) drug therapy: Secondary | ICD-10-CM | POA: Insufficient documentation

## 2019-02-18 DIAGNOSIS — Z90710 Acquired absence of both cervix and uterus: Secondary | ICD-10-CM | POA: Diagnosis not present

## 2019-02-18 DIAGNOSIS — N1330 Unspecified hydronephrosis: Secondary | ICD-10-CM

## 2019-02-18 DIAGNOSIS — R109 Unspecified abdominal pain: Secondary | ICD-10-CM

## 2019-02-18 DIAGNOSIS — Z888 Allergy status to other drugs, medicaments and biological substances status: Secondary | ICD-10-CM | POA: Diagnosis not present

## 2019-02-18 DIAGNOSIS — E119 Type 2 diabetes mellitus without complications: Secondary | ICD-10-CM

## 2019-02-18 DIAGNOSIS — G4733 Obstructive sleep apnea (adult) (pediatric): Secondary | ICD-10-CM | POA: Diagnosis present

## 2019-02-18 DIAGNOSIS — I110 Hypertensive heart disease with heart failure: Secondary | ICD-10-CM | POA: Diagnosis not present

## 2019-02-18 DIAGNOSIS — N132 Hydronephrosis with renal and ureteral calculous obstruction: Secondary | ICD-10-CM | POA: Diagnosis not present

## 2019-02-18 DIAGNOSIS — D509 Iron deficiency anemia, unspecified: Secondary | ICD-10-CM | POA: Diagnosis not present

## 2019-02-18 DIAGNOSIS — I509 Heart failure, unspecified: Secondary | ICD-10-CM | POA: Diagnosis not present

## 2019-02-18 DIAGNOSIS — G709 Myoneural disorder, unspecified: Secondary | ICD-10-CM | POA: Diagnosis not present

## 2019-02-18 DIAGNOSIS — Z887 Allergy status to serum and vaccine status: Secondary | ICD-10-CM | POA: Insufficient documentation

## 2019-02-18 DIAGNOSIS — N390 Urinary tract infection, site not specified: Secondary | ICD-10-CM | POA: Diagnosis present

## 2019-02-18 DIAGNOSIS — Q6211 Congenital occlusion of ureteropelvic junction: Secondary | ICD-10-CM

## 2019-02-18 DIAGNOSIS — R0902 Hypoxemia: Secondary | ICD-10-CM | POA: Diagnosis not present

## 2019-02-18 DIAGNOSIS — I10 Essential (primary) hypertension: Secondary | ICD-10-CM

## 2019-02-18 DIAGNOSIS — Z833 Family history of diabetes mellitus: Secondary | ICD-10-CM | POA: Insufficient documentation

## 2019-02-18 DIAGNOSIS — R52 Pain, unspecified: Secondary | ICD-10-CM | POA: Diagnosis not present

## 2019-02-18 DIAGNOSIS — K219 Gastro-esophageal reflux disease without esophagitis: Secondary | ICD-10-CM | POA: Insufficient documentation

## 2019-02-18 DIAGNOSIS — Z800 Family history of malignant neoplasm of digestive organs: Secondary | ICD-10-CM | POA: Diagnosis not present

## 2019-02-18 DIAGNOSIS — E1129 Type 2 diabetes mellitus with other diabetic kidney complication: Secondary | ICD-10-CM

## 2019-02-18 DIAGNOSIS — Z8249 Family history of ischemic heart disease and other diseases of the circulatory system: Secondary | ICD-10-CM | POA: Insufficient documentation

## 2019-02-18 DIAGNOSIS — R000 Tachycardia, unspecified: Secondary | ICD-10-CM | POA: Diagnosis not present

## 2019-02-18 DIAGNOSIS — K449 Diaphragmatic hernia without obstruction or gangrene: Secondary | ICD-10-CM | POA: Diagnosis not present

## 2019-02-18 DIAGNOSIS — N200 Calculus of kidney: Secondary | ICD-10-CM | POA: Diagnosis not present

## 2019-02-18 DIAGNOSIS — I5032 Chronic diastolic (congestive) heart failure: Secondary | ICD-10-CM

## 2019-02-18 DIAGNOSIS — E1169 Type 2 diabetes mellitus with other specified complication: Secondary | ICD-10-CM | POA: Insufficient documentation

## 2019-02-18 DIAGNOSIS — Z809 Family history of malignant neoplasm, unspecified: Secondary | ICD-10-CM | POA: Diagnosis not present

## 2019-02-18 DIAGNOSIS — Z743 Need for continuous supervision: Secondary | ICD-10-CM | POA: Diagnosis not present

## 2019-02-18 DIAGNOSIS — E6609 Other obesity due to excess calories: Secondary | ICD-10-CM | POA: Diagnosis present

## 2019-02-18 DIAGNOSIS — Z882 Allergy status to sulfonamides status: Secondary | ICD-10-CM | POA: Insufficient documentation

## 2019-02-18 DIAGNOSIS — Z6837 Body mass index (BMI) 37.0-37.9, adult: Secondary | ICD-10-CM | POA: Diagnosis not present

## 2019-02-18 DIAGNOSIS — Z7984 Long term (current) use of oral hypoglycemic drugs: Secondary | ICD-10-CM | POA: Diagnosis not present

## 2019-02-18 DIAGNOSIS — J45909 Unspecified asthma, uncomplicated: Secondary | ICD-10-CM | POA: Insufficient documentation

## 2019-02-18 DIAGNOSIS — N136 Pyonephrosis: Secondary | ICD-10-CM | POA: Diagnosis not present

## 2019-02-18 DIAGNOSIS — Z20828 Contact with and (suspected) exposure to other viral communicable diseases: Secondary | ICD-10-CM | POA: Diagnosis not present

## 2019-02-18 DIAGNOSIS — Z03818 Encounter for observation for suspected exposure to other biological agents ruled out: Secondary | ICD-10-CM | POA: Diagnosis not present

## 2019-02-18 HISTORY — PX: CYSTOSCOPY WITH STENT PLACEMENT: SHX5790

## 2019-02-18 LAB — COMPREHENSIVE METABOLIC PANEL
ALT: 19 U/L (ref 0–44)
AST: 24 U/L (ref 15–41)
Albumin: 4.1 g/dL (ref 3.5–5.0)
Alkaline Phosphatase: 60 U/L (ref 38–126)
Anion gap: 14 (ref 5–15)
BUN: 22 mg/dL (ref 8–23)
CO2: 22 mmol/L (ref 22–32)
Calcium: 9.4 mg/dL (ref 8.9–10.3)
Chloride: 106 mmol/L (ref 98–111)
Creatinine, Ser: 0.86 mg/dL (ref 0.44–1.00)
GFR calc Af Amer: >60 mL/min (ref >60)
GFR calc non Af Amer: >60 mL/min (ref >60)
Glucose, Bld: 137 mg/dL — ABNORMAL HIGH (ref 70–99)
Potassium: 3.4 mmol/L — ABNORMAL LOW (ref 3.5–5.1)
Sodium: 142 mmol/L (ref 135–145)
Total Bilirubin: 2 mg/dL — ABNORMAL HIGH (ref 0.3–1.2)
Total Protein: 7 g/dL (ref 6.5–8.1)

## 2019-02-18 LAB — URINALYSIS, COMPLETE (UACMP) WITH MICROSCOPIC
Bilirubin Urine: NEGATIVE
Glucose, UA: NEGATIVE mg/dL
Ketones, ur: 5 mg/dL — AB
Nitrite: POSITIVE — AB
Protein, ur: 30 mg/dL — AB
Specific Gravity, Urine: 1.015 (ref 1.005–1.030)
pH: 5 (ref 5.0–8.0)

## 2019-02-18 LAB — GLUCOSE, CAPILLARY
Glucose-Capillary: 198 mg/dL — ABNORMAL HIGH (ref 70–99)
Glucose-Capillary: 184 mg/dL — ABNORMAL HIGH (ref 70–99)
Glucose-Capillary: 198 mg/dL — ABNORMAL HIGH (ref 70–99)

## 2019-02-18 LAB — CBC
HCT: 44.5 % (ref 36.0–46.0)
Hemoglobin: 15.5 g/dL — ABNORMAL HIGH (ref 12.0–15.0)
MCH: 30.8 pg (ref 26.0–34.0)
MCHC: 34.8 g/dL (ref 30.0–36.0)
MCV: 88.5 fL (ref 80.0–100.0)
Platelets: 161 10*3/uL (ref 150–400)
RBC: 5.03 MIL/uL (ref 3.87–5.11)
RDW: 12.7 % (ref 11.5–15.5)
WBC: 8.1 10*3/uL (ref 4.0–10.5)
nRBC: 0 % (ref 0.0–0.2)

## 2019-02-18 LAB — SARS CORONAVIRUS 2 BY RT PCR (HOSPITAL ORDER, PERFORMED IN ~~LOC~~ HOSPITAL LAB): SARS Coronavirus 2: NEGATIVE

## 2019-02-18 LAB — LIPASE, BLOOD: Lipase: 11 U/L (ref 11–51)

## 2019-02-18 SURGERY — CYSTOSCOPY, WITH STENT INSERTION
Anesthesia: General | Site: Ureter | Laterality: Left

## 2019-02-18 MED ORDER — FENTANYL CITRATE (PF) 100 MCG/2ML IJ SOLN
INTRAMUSCULAR | Status: DC | PRN
  Administered 2019-02-18 (×2): 50 ug via INTRAVENOUS

## 2019-02-18 MED ORDER — PHENYLEPHRINE HCL (PRESSORS) 10 MG/ML IV SOLN
INTRAVENOUS | Status: DC | PRN
  Administered 2019-02-18 (×3): 100 ug via INTRAVENOUS

## 2019-02-18 MED ORDER — FENTANYL CITRATE (PF) 100 MCG/2ML IJ SOLN
INTRAMUSCULAR | Status: AC
  Filled 2019-02-18: qty 2

## 2019-02-18 MED ORDER — ONDANSETRON HCL 4 MG/2ML IJ SOLN
4.0000 mg | Freq: Four times a day (QID) | INTRAMUSCULAR | Status: DC | PRN
  Administered 2019-02-19: 4 mg via INTRAVENOUS
  Filled 2019-02-18: qty 2

## 2019-02-18 MED ORDER — SODIUM CHLORIDE 0.9 % IV SOLN
Freq: Once | INTRAVENOUS | Status: AC
  Administered 2019-02-18: 20:00:00 via INTRAVENOUS

## 2019-02-18 MED ORDER — ONDANSETRON HCL 4 MG/2ML IJ SOLN: 4.0000 mg | Freq: Once | INTRAMUSCULAR | Status: DC | PRN

## 2019-02-18 MED ORDER — PROPOFOL 10 MG/ML IV BOLUS
INTRAVENOUS | Status: DC | PRN
  Administered 2019-02-18: 100 mg via INTRAVENOUS

## 2019-02-18 MED ORDER — PROPOFOL 10 MG/ML IV BOLUS
INTRAVENOUS | Status: AC
  Filled 2019-02-18: qty 20

## 2019-02-18 MED ORDER — SODIUM CHLORIDE 0.9 % IV SOLN
INTRAVENOUS | Status: DC
  Administered 2019-02-18 – 2019-02-20 (×3): via INTRAVENOUS

## 2019-02-18 MED ORDER — SODIUM CHLORIDE 0.9% FLUSH
3.0000 mL | Freq: Two times a day (BID) | INTRAVENOUS | Status: DC
  Administered 2019-02-18 – 2019-02-19 (×2): 3 mL via INTRAVENOUS

## 2019-02-18 MED ORDER — LIDOCAINE HCL (CARDIAC) PF 100 MG/5ML IV SOSY
PREFILLED_SYRINGE | INTRAVENOUS | Status: DC | PRN
  Administered 2019-02-18: 50 mg via INTRAVENOUS

## 2019-02-18 MED ORDER — SODIUM CHLORIDE 0.9 % IV SOLN
1.0000 g | INTRAVENOUS | Status: DC
  Administered 2019-02-19: 1 g via INTRAVENOUS
  Filled 2019-02-18: qty 10
  Filled 2019-02-18: qty 1

## 2019-02-18 MED ORDER — INSULIN ASPART 100 UNIT/ML ~~LOC~~ SOLN: 0.0000 [IU] | Freq: Every day | SUBCUTANEOUS | Status: DC

## 2019-02-18 MED ORDER — MORPHINE SULFATE (PF) 2 MG/ML IV SOLN
2.0000 mg | INTRAVENOUS | Status: DC | PRN
  Administered 2019-02-19 (×3): 2 mg via INTRAVENOUS
  Filled 2019-02-18 (×3): qty 1

## 2019-02-18 MED ORDER — ONDANSETRON HCL 4 MG/2ML IJ SOLN
INTRAMUSCULAR | Status: DC | PRN
  Administered 2019-02-18: 4 mg via INTRAVENOUS

## 2019-02-18 MED ORDER — IOPAMIDOL (ISOVUE-200) INJECTION 41%
INTRAVENOUS | Status: DC | PRN
  Administered 2019-02-18: 9 mL via INTRAVENOUS

## 2019-02-18 MED ORDER — FENTANYL CITRATE (PF) 100 MCG/2ML IJ SOLN: 25.0000 ug | INTRAMUSCULAR | Status: DC | PRN

## 2019-02-18 MED ORDER — SODIUM CHLORIDE 0.9 % IV SOLN
1.0000 g | Freq: Once | INTRAVENOUS | Status: AC
  Administered 2019-02-18: 1 g via INTRAVENOUS
  Filled 2019-02-18: qty 10

## 2019-02-18 MED ORDER — INSULIN ASPART 100 UNIT/ML ~~LOC~~ SOLN
0.0000 [IU] | Freq: Three times a day (TID) | SUBCUTANEOUS | Status: DC
  Administered 2019-02-19 (×3): 2 [IU] via SUBCUTANEOUS
  Administered 2019-02-20: 1 [IU] via SUBCUTANEOUS
  Administered 2019-02-20: 2 [IU] via SUBCUTANEOUS
  Filled 2019-02-18 (×5): qty 1

## 2019-02-18 SURGICAL SUPPLY — 24 items
BAG DRAIN CYSTO-URO LG1000N (MISCELLANEOUS) ×2 IMPLANT
BAG DRN RND TRDRP ANRFLXCHMBR (UROLOGICAL SUPPLIES) ×1
BAG URINE DRAIN 2000ML AR STRL (UROLOGICAL SUPPLIES) ×1 IMPLANT
BRUSH SCRUB EZ  4% CHG (MISCELLANEOUS) ×1
BRUSH SCRUB EZ 4% CHG (MISCELLANEOUS) IMPLANT
CATH FOLEY 2WAY SIL 16X30 (CATHETERS) ×1 IMPLANT
CATH URETL 5X70 OPEN END (CATHETERS) ×2 IMPLANT
Contour soft percuflex ureteral stent ×1 IMPLANT
GLOVE BIO SURGEON STRL SZ8 (GLOVE) ×3 IMPLANT
GLOVE BIOGEL PI IND STRL 7.5 (GLOVE) IMPLANT
GLOVE BIOGEL PI INDICATOR 7.5 (GLOVE) ×1
GOWN STRL REUS W/ TWL XL LVL3 (GOWN DISPOSABLE) ×1 IMPLANT
GOWN STRL REUS W/TWL XL LVL3 (GOWN DISPOSABLE) ×4
GUIDEWIRE STR DUAL SENSOR (WIRE) ×2 IMPLANT
KIT TURNOVER CYSTO (KITS) ×2 IMPLANT
PACK CYSTO AR (MISCELLANEOUS) ×2 IMPLANT
SET CYSTO W/LG BORE CLAMP LF (SET/KITS/TRAYS/PACK) ×2 IMPLANT
SOL .9 NS 3000ML IRR  AL (IV SOLUTION) ×1
SOL .9 NS 3000ML IRR AL (IV SOLUTION) ×1
SOL .9 NS 3000ML IRR UROMATIC (IV SOLUTION) ×1 IMPLANT
STENT URET 6FRX24 CONTOUR (STENTS) ×1 IMPLANT
STENT URET 6FRX26 CONTOUR (STENTS) IMPLANT
SURGILUBE 2OZ TUBE FLIPTOP (MISCELLANEOUS) ×2 IMPLANT
WATER STERILE IRR 1000ML POUR (IV SOLUTION) ×2 IMPLANT

## 2019-02-18 NOTE — Transfer of Care (Signed)
Immediate Anesthesia Transfer of Care Note  Patient: Gabriella Howe  Procedure(s) Performed: CYSTOSCOPY WITH STENT PLACEMENT (Left Ureter)  Patient Location: PACU  Anesthesia Type:General  Level of Consciousness: awake, alert  and patient cooperative  Airway & Oxygen Therapy: Patient Spontanous Breathing and Patient connected to face mask oxygen  Post-op Assessment: Report given to RN and Post -op Vital signs reviewed and stable  Post vital signs: Reviewed and stable  Last Vitals:  Vitals Value Taken Time  BP 122/61 02/18/19 2107  Temp 36.7 C 02/18/19 2107  Pulse 90 02/18/19 2115  Resp 19 02/18/19 2115  SpO2 99 % 02/18/19 2115  Vitals shown include unvalidated device data.  Last Pain:  Vitals:   02/18/19 2107  TempSrc:   PainSc: Asleep         Complications: No apparent anesthesia complications

## 2019-02-18 NOTE — ED Triage Notes (Signed)
Pt in via [facility] with c/o left flank pain since 830 this am that radiate to LLQ. EMS administered 25mcg of fentanyl intranasally and 4mg  zofran IM. Pt reported a decrease in nausea. EMS reports pt with hx of kidney stones and reports this feels the same.

## 2019-02-18 NOTE — ED Provider Notes (Signed)
[facility] Emergency Department Provider Note    First MD Initiated Contact with Patient 02/18/19 1530     (approximate)  I have reviewed the triage vital signs and the nursing notes.   HISTORY  Chief Complaint Flank Pain and Abdominal Pain    HPI Gabriella Howe is a 84 y.o. female listed past medical history presents to the ER for left flank pain rating down to her groin started today.  Colicky in nature.  States is moderate to severe in nature.  Did have some chills but no measured fevers.  Not having any dysuria.  She was given some Zofran and pain medication in route via EMS blunts she feels well now.  Does have a history of kidney stones and has had a history of infected stones requiring ICU admission after developing sepsis.    Past Medical History:  Diagnosis Date  . Adenomatous colon polyp 2003  . Benign neoplasm of colon   . Carpal tunnel syndrome   . Chest pain, unspecified   . CHF (congestive heart failure) (HCC)    diastolic  . Degeneration of intervertebral disc, site unspecified   . Diabetes mellitus    type II  . Encounter for long-term (current) use of other medications   . Fluid overload   . GERD (gastroesophageal reflux disease)   . Heart disease, unspecified   . Hyperlipidemia   . Hypertension   . Kidney stone 3/12   hosp/ with stent  . Obesity, unspecified   . Rosacea   . Shortness of breath   . Unspecified asthma(493.90)   . Unspecified sleep apnea    Family History  Problem Relation Age of Onset  . Early death Father 88       train accident  . Diabetes Mother   . Heart disease Mother   . Diabetes Sister   . Diabetes Brother   . Colon cancer Brother   . Cancer Paternal Grandmother        unknown  . Diabetes Sister    Past Surgical History:  Procedure Laterality Date  . ABDOMINAL HYSTERECTOMY     partial, fibroids  . ESOPHAGOGASTRODUODENOSCOPY  2000  . UMBILICAL HERNIA REPAIR  01/2004  . URETERAL STENT  PLACEMENT  2/13   for L sided stone/ ecoli sepsis   Patient Active Problem List   Diagnosis Date Noted  . Somnolence, daytime 07/31/2017  . Iron deficiency anemia 04/30/2017  . History of shingles 10/03/2016  . Routine general medical examination at a health care facility 04/14/2015  . Back pain 07/28/2014  . Knee pain 07/28/2014  . Encounter for Medicare annual wellness exam 11/19/2012  . History of kidney stones 11/19/2012  . Hydronephrosis of left kidney 06/07/2011  . UTI (urinary tract infection) 06/07/2011  . DIASTOLIC HEART FAILURE, CHRONIC 07/29/2009  . Hyperlipidemia associated with type 2 diabetes mellitus ([place]) 09/13/2006  . Morbid obesity ([place]) 09/10/2006  . CARPAL TUNNEL SYNDROME 09/10/2006  . Essential hypertension 09/10/2006  . DIASTOLIC DYSFUNCTION [id number]  . REACTIVE AIRWAY DISEASE 09/10/2006  . GERD 09/10/2006  . ROSACEA 09/10/2006  . Degeneration of lumbar or lumbosacral intervertebral disc 09/10/2006  . OSA (obstructive sleep apnea) 09/10/2006  . COLONIC POLYPS, HX OF 09/10/2006  . Diabetes type 2, controlled ([place]) 09/06/2006      Prior to Admission medications   Medication Sig Start Date End Date Taking? Authorizing Provider  Ascorbic Acid (VITAMIN C) 500 MG tablet Take 500 mg by mouth daily.  [provider]  Aspirin-Caffeine (BC FAST PAIN RELIEF PO) Take by mouth as directed.      [provider]  benazepril (LOTENSIN) 20 MG tablet Take 1 tablet (20 mg total) by mouth daily. 05/07/18   Tower, Wynelle Fanny, MD  CALCIUM-VITAMIN D PO Take 1 tablet by mouth 2 (two) times daily.     [provider]  Cyanocobalamin (B-12 PO) Take 1 tablet by mouth daily.    [provider]  furosemide (LASIX) 40 MG tablet Take 0.5 tablets (20 mg total) by mouth daily. 05/07/18   Tower, Wynelle Fanny, MD  glipiZIDE (GLUCOTROL XL) 5 MG 24 hr tablet Take 1 tablet (5 mg total) by mouth daily with breakfast. 05/07/18   Tower, Wynelle Fanny, MD  glucose blood  (BAYER CONTOUR TEST) test strip Use to check blood sugar once daily as directed for DM (Dx. E11.9) 04/11/16   Tower, Wynelle Fanny, MD  Magnesium 250 MG TABS Take by mouth daily.    [provider]  metFORMIN (GLUCOPHAGE) 1000 MG tablet Take 1 tablet (1,000 mg total) by mouth 2 (two) times daily with a meal. 05/07/18   Tower, Wynelle Fanny, MD  MICROLET LANCETS MISC Use to check blood sugar once daily as directed for DM (Dx. E11.9) 04/11/16   Tower, Wynelle Fanny, MD  Multiple Vitamin (MULTIVITAMIN) capsule Take 1 capsule by mouth daily.      [provider]  Omega-3 Fatty Acids (FISH OIL PO) Take 1 capsule by mouth daily.     [provider]  omeprazole (PRILOSEC) 20 MG capsule Take 1 capsule (20 mg total) by mouth daily. 05/07/18   Tower, Wynelle Fanny, MD  Pseudoephedrine HCl (SINUS & ALLERGY 12 HOUR PO) Take by mouth as directed.      [provider]  simvastatin (ZOCOR) 40 MG tablet Take 1 tablet (40 mg total) by mouth at bedtime. 05/07/18   Tower, Wynelle Fanny, MD  triamcinolone cream (KENALOG) 0.1 % APPLY TO AFFECTED AREA(S)  TOPICALLY TWO TIMES A DAY 02/17/19   Tower, Wynelle Fanny, MD  vitamin E 400 UNIT capsule Take 400 Units by mouth daily.      [provider]    Allergies Rofecoxib; Tetanus-diphtheria toxoids td; Antihistamines, chlorpheniramine-type; and Sulfa antibiotics    Social History Social History   Tobacco Use  . Smoking status: Never Smoker  . Smokeless tobacco: Never Used  Substance Use Topics  . Alcohol use: No    Alcohol/week: 0.0 standard drinks  . Drug use: No    Review of Systems Patient denies headaches, rhinorrhea, blurry vision, numbness, shortness of breath, chest pain, edema, cough, abdominal pain, nausea, vomiting, diarrhea, dysuria, fevers, rashes or hallucinations unless otherwise stated above in HPI. ____________________________________________   PHYSICAL EXAM:  VITAL SIGNS: Vitals:   02/18/19 1359 02/18/19 1748  BP: (!) 149/64 (!)  133/112  Pulse: 98 96  Resp: 20 18  Temp: 98.8 F (37.1 C) 99.1 F (37.3 C)  SpO2: 95% 94%    Constitutional: Alert and oriented.  Eyes: Conjunctivae are normal.  Head: Atraumatic. Nose: No congestion/rhinnorhea. Mouth/Throat: Mucous membranes are moist.   Neck: No stridor. Painless ROM.  Cardiovascular: Normal rate, regular rhythm. Grossly normal heart sounds.  Good peripheral circulation. Respiratory: Normal respiratory effort.  No retractions. Lungs CTAB. Gastrointestinal: Soft and nontender. No distention. No abdominal bruits. No CVA tenderness. Genitourinary:  Musculoskeletal: No lower extremity tenderness nor edema.  No joint effusions. Neurologic:  Normal speech and language. No gross focal neurologic  deficits are appreciated. No facial droop Skin:  Skin is warm, dry and intact. No rash noted. Psychiatric: Mood and affect are normal. Speech and behavior are normal.  ____________________________________________   LABS (all labs ordered are listed, but only abnormal results are displayed)  Results for orders placed or performed during the hospital encounter of 02/18/19 (from the past 24 hour(s))  Urinalysis, Complete w Microscopic     Status: Abnormal   Collection Time: 02/18/19  1:32 PM  Result Value Ref Range   Color, Urine YELLOW (A) YELLOW   APPearance HAZY (A) CLEAR   Specific Gravity, Urine 1.015 1.005 - 1.030   pH 5.0 5.0 - 8.0   Glucose, UA NEGATIVE NEGATIVE mg/dL   Hgb urine dipstick MODERATE (A) NEGATIVE   Bilirubin Urine NEGATIVE NEGATIVE   Ketones, ur 5 (A) NEGATIVE mg/dL   Protein, ur 30 (A) NEGATIVE mg/dL   Nitrite POSITIVE (A) NEGATIVE   Leukocytes,Ua SMALL (A) NEGATIVE   RBC / HPF 11-20 0 - 5 RBC/hpf   WBC, UA 21-50 0 - 5 WBC/hpf   Bacteria, UA MANY (A) NONE SEEN   Squamous Epithelial / LPF 0-5 0 - 5   Mucus PRESENT    Hyaline Casts, UA PRESENT    Non Squamous Epithelial PRESENT (A) NONE SEEN  Lipase, blood     Status: None   Collection Time:  02/18/19  1:59 PM  Result Value Ref Range   Lipase 11 11 - 51 U/L  Comprehensive metabolic panel     Status: Abnormal   Collection Time: 02/18/19  1:59 PM  Result Value Ref Range   Sodium 142 135 - 145 mmol/L   Potassium 3.4 (L) 3.5 - 5.1 mmol/L   Chloride 106 98 - 111 mmol/L   CO2 22 22 - 32 mmol/L   Glucose, Bld 137 (H) 70 - 99 mg/dL   BUN 22 8 - 23 mg/dL   Creatinine, Ser 0.86 0.44 - 1.00 mg/dL   Calcium 9.4 8.9 - 10.3 mg/dL   Total Protein 7.0 6.5 - 8.1 g/dL   Albumin 4.1 3.5 - 5.0 g/dL   AST 24 15 - 41 U/L   ALT 19 0 - 44 U/L   Alkaline Phosphatase 60 38 - 126 U/L   Total Bilirubin 2.0 (H) 0.3 - 1.2 mg/dL   GFR calc non Af Amer >60 >60 mL/min   GFR calc Af Amer >60 >60 mL/min   Anion gap 14 5 - 15  CBC     Status: Abnormal   Collection Time: 02/18/19  1:59 PM  Result Value Ref Range   WBC 8.1 4.0 - 10.5 K/uL   RBC 5.03 3.87 - 5.11 MIL/uL   Hemoglobin 15.5 (H) 12.0 - 15.0 g/dL   HCT 44.5 36.0 - 46.0 %   MCV 88.5 80.0 - 100.0 fL   MCH 30.8 26.0 - 34.0 pg   MCHC 34.8 30.0 - 36.0 g/dL   RDW 12.7 11.5 - 15.5 %   Platelets 161 150 - 400 K/uL   nRBC 0.0 0.0 - 0.2 %   ____________________________________________  EKG My review and personal interpretation at Time: 14:07   Indication: flank pain  Rate: 105  Rhythm: sinus Axis: left Other: no stemi, nonspecific st abn ____________________________________________  RADIOLOGY  I personally reviewed all radiographic images ordered to evaluate for the above acute complaints and reviewed radiology reports and findings.  These findings were personally discussed with the patient.  Please see medical record for radiology report.  ____________________________________________  PROCEDURES  Procedure(s) performed:  Procedures    Critical Care performed: no ____________________________________________   INITIAL IMPRESSION / ASSESSMENT AND PLAN / ED COURSE  Pertinent labs & imaging results that were available during my  care of the patient were reviewed by me and considered in my medical decision making (see chart for details).   DDX: pyelo, msk strain, AAA, colitis, diverticulitis, uti  Gabriella Howe is a 84 y.o. who presents to the ED with left ankle pain as described above she arrives afebrile hemodynamically stable.  Pain improved after IV pain medication via EMS.  Given her history CT imaging ordered for above differential.  Clinical Course as of Feb 17 1801  Tue Feb 18, 2019  1718 Urinalysis is nitrite positive concerning for infection though she does not have any other markers of sepsis at this time.  Given CT imaging showing severe hydro with perinephric stranding this was discussed in consultation with Dr. Bernardo Heater of urology.  Agrees with plan for IV antibiotics, admission as well as plan for stent placement this evening.  Patient updated and agrees with plan.   [PR]    Clinical Course User Index [PR] Merlyn Lot, MD    The patient was evaluated in Emergency Department today for the symptoms described in the history of present illness. He/she was evaluated in the context of the global COVID-19 pandemic, which necessitated consideration that the patient might be at risk for infection with the SARS-CoV-2 virus that causes COVID-19. Institutional protocols and algorithms that pertain to the evaluation of patients at risk for COVID-19 are in a state of rapid change based on information released by regulatory bodies including the CDC and federal and state organizations. These policies and algorithms were followed during the patient's care in the ED.  As part of my medical decision making, I reviewed the following data within the [place] notes reviewed and incorporated, Labs reviewed, notes from prior ED visits and [place] Controlled Substance Database   ____________________________________________   FINAL CLINICAL IMPRESSION(S) / ED DIAGNOSES  Final diagnoses:  Kidney  stone  Hydronephrosis with ureteropelvic junction (UPJ) obstruction  Flank pain      NEW MEDICATIONS STARTED DURING THIS VISIT:  New Prescriptions   No medications on file     Note:  This document was prepared using Dragon voice recognition software and may include unintentional dictation errors.    Merlyn Lot, MD 02/18/19 Johnnye Lana

## 2019-02-18 NOTE — Progress Notes (Signed)
Daughter at bedside, Tresa Res, received patients gown and underwear. Upper partial plate, shoes, and cell-phone sent with pt to PACU.

## 2019-02-18 NOTE — H&P (Addendum)
History and Physical  Gabriella Howe [id number] DOB: 23-Sep-1934 DOA: 02/18/2019  Referring physician: Chesley Mires, ER physician PCP: Abner Greenspan, MD  Outpatient Specialists: None Patient coming from: Home & is able to ambulate with use of a cane  Chief Complaint: Left flank pain and fever/chills  HPI: Gabriella Howe is a 84 y.o. female with medical history significant for diastolic heart failure, diabetes mellitus and hypertension who was in her usual state of health when she woke up approximately 5:00 this morning with fever and chills and left flank pain.  She became concerned so she came into the emergency room.  In the emergency room, patient was noted to have a normal white count and no fever and blood pressure was stable.  However, CT scan of abdomen/pelvis noted severe hydronephrosis on the left with extensive soft tissue stranding and fluid in the perinephric fascia on the left as well as a 1 x 0.9 cm renal stone in the left ureteropelvic junction looking to be obstructive.  Patient also found to have a very large UTI.  She was given fluids and antibiotics plus medication for pain.  Urology was consulted who plan to take patient later for stone extraction.  Hospitalist were called for further evaluation.   Review of Systems: Patient seen in the emergency room. Pt complains of minimal left flank pain and feeling hungry.  Her left flank pain is much improved now after she getting some morphine.  Pt denies any headaches, vision changes, dysphagia, chest pain, palpitations, shortness of breath, wheeze, cough, abdominal pain, hematuria, dysuria, constipation or diarrhea, focal extremity numbness weakness or pain.  Review of systems are otherwise negative   Past Medical History:  Diagnosis Date  . Adenomatous colon polyp 2003  . Benign neoplasm of colon   . Carpal tunnel syndrome   . Chest pain, unspecified   . CHF (congestive heart failure) (HCC)    diastolic  .  Degeneration of intervertebral disc, site unspecified   . Diabetes mellitus    type II  . Encounter for long-term (current) use of other medications   . Fluid overload   . GERD (gastroesophageal reflux disease)   . Heart disease, unspecified   . Hyperlipidemia   . Hypertension   . Kidney stone 3/12   hosp/ with stent  . Obesity, unspecified   . Rosacea   . Shortness of breath   . Unspecified asthma(493.90)   . Unspecified sleep apnea    Past Surgical History:  Procedure Laterality Date  . ABDOMINAL HYSTERECTOMY     partial, fibroids  . ESOPHAGOGASTRODUODENOSCOPY  2000  . UMBILICAL HERNIA REPAIR  01/2004  . URETERAL STENT PLACEMENT  2/13   for L sided stone/ ecoli sepsis    Social History:  reports that she has never smoked. She has never used smokeless tobacco. She reports that she does not drink alcohol or use drugs.  Lives alone.  Ambulates with use of a cane.   Allergies  Allergen Reactions  . Rofecoxib     REACTION: reaction not known  . Tetanus-Diphtheria Toxoids Td     REACTION: sick with fever  . Antihistamines, Chlorpheniramine-Type Palpitations  . Sulfa Antibiotics Rash    Family History  Problem Relation Age of Onset  . Early death Father 67       train accident  . Diabetes Mother   . Heart disease Mother   . Diabetes Sister   . Diabetes Brother   . Colon cancer Brother   .  Cancer Paternal Grandmother        unknown  . Diabetes Sister       Prior to Admission medications   Medication Sig Start Date End Date Taking? Authorizing Provider  Ascorbic Acid (VITAMIN C) 500 MG tablet Take 500 mg by mouth daily.      [provider]  Aspirin-Caffeine (BC FAST PAIN RELIEF PO) Take by mouth as directed.      [provider]  benazepril (LOTENSIN) 20 MG tablet Take 1 tablet (20 mg total) by mouth daily. 05/07/18   Tower, Wynelle Fanny, MD  CALCIUM-VITAMIN D PO Take 1 tablet by mouth 2 (two) times daily.     [provider]  Cyanocobalamin  (B-12 PO) Take 1 tablet by mouth daily.    [provider]  furosemide (LASIX) 40 MG tablet Take 0.5 tablets (20 mg total) by mouth daily. 05/07/18   Tower, Wynelle Fanny, MD  glipiZIDE (GLUCOTROL XL) 5 MG 24 hr tablet Take 1 tablet (5 mg total) by mouth daily with breakfast. 05/07/18   Tower, Wynelle Fanny, MD  glucose blood (BAYER CONTOUR TEST) test strip Use to check blood sugar once daily as directed for DM (Dx. E11.9) 04/11/16   Tower, Wynelle Fanny, MD  Magnesium 250 MG TABS Take by mouth daily.    [provider]  metFORMIN (GLUCOPHAGE) 1000 MG tablet Take 1 tablet (1,000 mg total) by mouth 2 (two) times daily with a meal. 05/07/18   Tower, Wynelle Fanny, MD  MICROLET LANCETS MISC Use to check blood sugar once daily as directed for DM (Dx. E11.9) 04/11/16   Tower, Wynelle Fanny, MD  Multiple Vitamin (MULTIVITAMIN) capsule Take 1 capsule by mouth daily.      [provider]  Omega-3 Fatty Acids (FISH OIL PO) Take 1 capsule by mouth daily.     [provider]  omeprazole (PRILOSEC) 20 MG capsule Take 1 capsule (20 mg total) by mouth daily. 05/07/18   Tower, Wynelle Fanny, MD  Pseudoephedrine HCl (SINUS & ALLERGY 12 HOUR PO) Take by mouth as directed.      [provider]  simvastatin (ZOCOR) 40 MG tablet Take 1 tablet (40 mg total) by mouth at bedtime. 05/07/18   Tower, Wynelle Fanny, MD  triamcinolone cream (KENALOG) 0.1 % APPLY TO AFFECTED AREA(S)  TOPICALLY TWO TIMES A DAY 02/17/19   Tower, Wynelle Fanny, MD  vitamin E 400 UNIT capsule Take 400 Units by mouth daily.      [provider]    Physical Exam: BP (!) 133/112 (BP Location: Left Arm)   Pulse 96   Temp 99.1 F (37.3 C) (Oral)   Resp 18   Ht 5\' 1"  (1.549 m)   Wt 90.7 kg   SpO2 94%   BMI 37.79 kg/m   General: Alert and oriented x3, no acute distress Eyes: Sclera nonicteric, extraocular movements are intact ENT: Normocephalic and atraumatic, mucous membrane slightly dry Neck: Supple, no JVD Cardiovascular: Regular rate and  rhythm, S1-S2 Respiratory: Clear to auscultation bilaterally Abdomen: Soft, obese, nontender, positive bowel sounds Skin: No skin breaks, tears or lesions Musculoskeletal: No clubbing or cyanosis or edema Psychiatric: Appropriate, no evidence of psychoses Neurologic: No focal deficits          Labs on Admission:  Basic Metabolic Panel: Recent Labs  Lab 02/18/19 1359  NA 142  K 3.4*  CL 106  CO2 22  GLUCOSE 137*  BUN 22  CREATININE 0.86  CALCIUM 9.4   Liver  Function Tests: Recent Labs  Lab 02/18/19 1359  AST 24  ALT 19  ALKPHOS 60  BILITOT 2.0*  PROT 7.0  ALBUMIN 4.1   Recent Labs  Lab 02/18/19 1359  LIPASE 11   No results for input(s): AMMONIA in the last 168 hours. CBC: Recent Labs  Lab 02/18/19 1359  WBC 8.1  HGB 15.5*  HCT 44.5  MCV 88.5  PLT 161   Cardiac Enzymes: No results for input(s): CKTOTAL, CKMB, CKMBINDEX, TROPONINI in the last 168 hours.  BNP (last 3 results) No results for input(s): BNP in the last 8760 hours.  ProBNP (last 3 results) No results for input(s): PROBNP in the last 8760 hours.  CBG: No results for input(s): GLUCAP in the last 168 hours.  Radiological Exams on Admission: Ct Renal Stone Study  Result Date: 02/18/2019 CLINICAL DATA:  Left flank pain EXAM: CT ABDOMEN AND PELVIS WITHOUT CONTRAST TECHNIQUE: Multidetector CT imaging of the abdomen and pelvis was performed following the standard protocol without oral or IV contrast. COMPARISON:  December 19, 2012 FINDINGS: Lower chest: There is mild scarring in the lung bases. There is no lung base edema or consolidation. There is a small hiatal hernia. Hepatobiliary: No focal liver lesions are evident on this noncontrast enhanced study. There is cholelithiasis. There is no appreciable gallbladder wall thickening. There is no evident biliary duct dilatation. Pancreas: There is no pancreatic mass or inflammatory focus. Spleen: No splenic lesions are evident. A small splenule is  noted anterior to the spleen. Adrenals/Urinary Tract: Adrenals bilaterally appear unremarkable. Left kidney is edematous. There is extensive soft tissue stranding and fluid in the perinephric fascia on the left. There is a 5 mm cyst arising from the lateral mid to lower right kidney. There is severe hydronephrosis on the left. There is no appreciable hydronephrosis on the right. No calculi evident in the right kidney. There is a calculus in the lower pole calyx region on the right measuring 8 x 8 mm. A nearby second calculus in this area measures 9 x 7 mm. There is a 1 mm calculus in the mid left kidney. There is a 2 mm calculus more inferiorly in the lower pole left kidney. There is a 3 x 2 mm calculus in the left renal pelvis. There is a calculus at the left ureteropelvic junction measuring 1.0 x 0.9 cm. No other ureteral calculi are evident on either side. Urinary bladder is midline with wall thickness within normal limits. Stomach/Bowel: There is no appreciable bowel wall or mesenteric thickening. There is no appreciable bowel obstruction. The terminal ileum appears normal. There is no evident free air or portal venous air. Vascular/Lymphatic: There is no abdominal aortic aneurysm. Aorta is tortuous. There is mild calcification in the aorta and common iliac arteries. There is no appreciable adenopathy in the abdomen or pelvis. Reproductive: Uterus is absent. There is no pelvic mass evident. Other: There is no appendiceal region inflammation. There is no abscess or ascites in the abdomen or pelvis. Musculoskeletal: There is extensive degenerative type change throughout lower thoracic and lumbar regions. There is moderately severe spinal stenosis at L4-5 due to diffuse bony overgrowth. No blastic or lytic bone lesions are evident. There is fatty infiltration in the lateral aspect of the left tensor fascia lata muscle. No abdominal wall lesions are evident. IMPRESSION: 1. There is severe hydronephrosis on the left  with extensive soft tissue stranding and fluid in the perinephric fascia on the left. There is a calculus at the left ureteropelvic  junction measuring 1.0 x 0.9 cm. Multiple calculi throughout the left kidney, primarily in the left lower pole region. 2. Cholelithiasis.  No appreciable gallbladder wall thickening 3. Small hiatal hernia. 4. Aortic atherosclerosis. 5. Severe degenerative type change throughout the lower thoracic and lumbar spine. There is moderately severe spinal stenosis at L4-5 due to diffuse bony overgrowth. 6. No bowel obstruction. No abscess in the abdomen or pelvis. No periappendiceal region inflammation. Aortic Atherosclerosis (ICD10-I70.0). Electronically Signed   By: Lowella Grip III M.D.   On: 02/18/2019 16:11    EKG: Independently reviewed.  Sinus tachycardia with L axis deviation.  No change from previous  Assessment/Plan Present on Admission: . Hyperlipidemia associated with type 2 diabetes mellitus ([place]): Stable.  Since she is n.p.o., sliding scale only for now.  Holding oral Metformin  . Essential hypertension: Blood pressure stable. Marland Kitchen DIASTOLIC HEART FAILURE, CHRONIC: Euvolemic.  Gentle IV fluids.  . OSA (obstructive sleep apnea) . Hydronephrosis of left kidney secondary to obstructing renal stone.  Patient has not had a obstructing renal stone in almost 7 years.  Urology to take for stone extraction.  . Morbid obesity ([place]): Meets criteria with BMI greater than 35+ hypertension and heart failure.  Marland Kitchen UTI (urinary tract infection)/left-sided pyelonephritis: Secondary to obstructed stone.  IV Rocephin.  Principal Problem:   Hydronephrosis of left kidney Active Problems:   Diabetes type 2, controlled ([place])   Hyperlipidemia associated with type 2 diabetes mellitus ([place])   Morbid obesity ([place])   Essential hypertension   DIASTOLIC HEART FAILURE, CHRONIC   OSA (obstructive sleep apnea)   UTI (urinary tract infection)   DVT prophylaxis: SCDs  Code Status:  Full code  Family Communication: Left message for daughter  Disposition Plan: Potential discharge tomorrow if stable post stone extraction  Consults called: Sto[place]ff, urology  Admission status: Given possibility patient may be discharged tomorrow, placed under observation    Annita Brod MD [facility] Pager [phone]  If 7PM-7AM, please contact night-coverage [email or web address] Password [facility]  02/18/2019, 5:59 PM

## 2019-02-18 NOTE — Anesthesia Preprocedure Evaluation (Signed)
Anesthesia Evaluation  Patient identified by MRN, date of birth, ID band Patient awake    Reviewed: Allergy & Precautions, NPO status , Patient's Chart, lab work & pertinent test results, reviewed documented beta blocker date and time   Airway Mallampati: III  TM Distance: >3 FB     Dental  (+) Chipped   Pulmonary shortness of breath, sleep apnea ,           Cardiovascular hypertension, Pt. on medications +CHF       Neuro/Psych  Neuromuscular disease    GI/Hepatic GERD  Controlled,  Endo/Other  diabetes, Type 2  Renal/GU Renal disease     Musculoskeletal  (+) Arthritis ,   Abdominal   Peds  Hematology  (+) anemia ,   Anesthesia Other Findings Obese. Low sats, 94-95. Poor R wave progression, but no cardiac symptoms.  Reproductive/Obstetrics                             Anesthesia Physical Anesthesia Plan  ASA: III  Anesthesia Plan: General   Post-op Pain Management:    Induction: Intravenous  PONV Risk Score and Plan:   Airway Management Planned: LMA  Additional Equipment:   Intra-op Plan:   Post-operative Plan:   Informed Consent: I have reviewed the patients History and Physical, chart, labs and discussed the procedure including the risks, benefits and alternatives for the proposed anesthesia with the patient or authorized representative who has indicated his/her understanding and acceptance.       Plan Discussed with: CRNA  Anesthesia Plan Comments:         Anesthesia Quick Evaluation

## 2019-02-18 NOTE — Anesthesia Procedure Notes (Signed)
Procedure Name: LMA Insertion Date/Time: 02/18/2019 8:35 PM Performed by: Lendon Colonel, CRNA Pre-anesthesia Checklist: Patient identified, Patient being monitored, Timeout performed, Emergency Drugs available and Suction available Patient Re-evaluated:Patient Re-evaluated prior to induction Oxygen Delivery Method: Circle system utilized Preoxygenation: Pre-oxygenation with 100% oxygen Induction Type: IV induction Ventilation: Mask ventilation without difficulty LMA: LMA inserted LMA Size: 4.0 Tube type: Oral Number of attempts: 1 Placement Confirmation: positive ETCO2 and breath sounds checked- equal and bilateral Tube secured with: Tape Dental Injury: Teeth and Oropharynx as per pre-operative assessment

## 2019-02-18 NOTE — ED Notes (Signed)
Pt reports left flank pain that started this am. Pt reports hx of kidney stones and is concerned that this is a stone because the last time she had a stone she ended up with sepsis. Pt reports she also had chills and nausea with it.

## 2019-02-18 NOTE — Op Note (Signed)
Preoperative diagnosis:  1. Obstructing left UPJ calculus with infection  Postoperative diagnosis:  1. Same  Procedure:  1. Cystoscopy 2. Left ureteral stent placement (6FR) 24 cm 3. Left retrograde pyelography with interpretation   Surgeon: Nicki Reaper C. Kalyb Pemble, M.D.  Anesthesia: General  Complications: None  Intraoperative findings:  1. Left retrograde pyelogram-moderate left hydronephrosis with left UPJ calculus pushed back to renal pelvis  2. Turbid urine aspirated from renal pelvis  EBL: Minimal  Specimens: Urine left renal pelvis for culture  Indication: Gabriella Howe is a 84 y.o. patient with left flank pain and a 10 mm left UPJ calculus with severe hydronephrosis on CT.  Previous history of sepsis secondary to obstructing calculi requiring ICU admission. After reviewing the management options for treatment, he elected to proceed with the above surgical procedure(s). We have discussed the potential benefits and risks of the procedure, side effects of the proposed treatment, the likelihood of the patient achieving the goals of the procedure, and any potential problems that might occur during the procedure or recuperation. Informed consent has been obtained.  Description of procedure:  The patient was taken to the operating room and general anesthesia was induced.  The patient was placed in the dorsal lithotomy position, prepped and draped in the usual sterile fashion, and preoperative antibiotics were administered. A preoperative time-out was performed.   A 21 French cystoscope was lubricated and passed per urethra.  The bladder was then systematically examined in its entirety.  There was scattered mucosal erythema endoscopically consistent with cystitis.  No solid or papillary lesions were identified  Attention then turned to the left ureteral orifice and a ureteral catheter was used to intubate the ureteral orifice.  The ureteral catheter was advanced proximally.  A 0.038  sensor wire was then placed through the ureteral catheter and advanced passed the UPJ stone under fluoroscopic guidance.  The ureteral catheter was then advanced over the wire proximal to the calculus and urine was aspirated as described above.  Omnipaque contrast was then injected through the ureteral catheter and a retrograde pyelogram was performed with findings as dictated above.  The guidewire was replaced and the ureteral catheter was removed.  A 6FR/24 cm Contour ureteral stent was then placed over the wire.  A good proximal curl was seen superior to the renal pelvic calculus.  Distal positioning was appropriate under direct vision.  The bladder was then emptied and the cystoscope was removed.  A 16 French Foley catheter was placed and inflated with 10 cc of sterile water.  The patient appeared to tolerate the procedure well and without complications.  The patient was able to be awakened and transferred to the recovery unit in satisfactory condition.   John Giovanni, MD

## 2019-02-18 NOTE — Anesthesia Post-op Follow-up Note (Signed)
Anesthesia QCDR form completed.        

## 2019-02-18 NOTE — ED Notes (Signed)
Pt unable to provide a urine at this time

## 2019-02-18 NOTE — Consult Note (Signed)
Urology Consult  Chief Complaint: Flank pain  History of Present Illness: Gabriella Howe is a 84 y.o. year old female who presented to the ED today with left flank pain radiating to the left groin region.  Her pain intensity was rated moderate to severe without identifiable precipitating, aggravating or alleviating factors.  She had nausea but received Zofran and pain medication in route via EMS.  She has had chills but no definite fever.  She has no bothersome lower urinary tract symptoms.  She has a prior history of stones and required admission with stent placement for sepsis secondary to an obstructing stone in August 2014.  A stone protocol CT of the abdomen pelvis was performed which showed a 10 mm left UPJ calculus with severe hydronephrosis and perinephric stranding.  Urinalysis was nitrite positive with pyuria.  She does not have leukocytosis and has stable vital signs and is afebrile.  Past Medical History:  Diagnosis Date   Adenomatous colon polyp 2003   Benign neoplasm of colon    Carpal tunnel syndrome    Chest pain, unspecified    CHF (congestive heart failure) (HCC)    diastolic   Degeneration of intervertebral disc, site unspecified    Diabetes mellitus    type II   Encounter for long-term (current) use of other medications    Fluid overload    GERD (gastroesophageal reflux disease)    Heart disease, unspecified    Hyperlipidemia    Hypertension    Kidney stone 3/12   hosp/ with stent   Obesity, unspecified    Rosacea    Shortness of breath    Unspecified asthma(493.90)    Unspecified sleep apnea     Past Surgical History:  Procedure Laterality Date   ABDOMINAL HYSTERECTOMY     partial, fibroids   ESOPHAGOGASTRODUODENOSCOPY  [id number]   UMBILICAL HERNIA REPAIR  01/2004   URETERAL STENT PLACEMENT  2/13   for L sided stone/ ecoli sepsis    Home Medications:  Current Facility-Administered Medications for the 02/18/19 encounter  [facility] Encounter)  Medication   0.9 %  sodium chloride infusion   No outpatient medications have been marked as taking for the 02/18/19 encounter [facility] Encounter).    Allergies:  Allergies  Allergen Reactions   Rofecoxib     REACTION: reaction not known   Tetanus-Diphtheria Toxoids Td     REACTION: sick with fever   Antihistamines, Chlorpheniramine-Type Palpitations   Sulfa Antibiotics Rash    Family History  Problem Relation Age of Onset   Early death Father 77       train accident   Diabetes Mother    Heart disease Mother    Diabetes Sister    Diabetes Brother    Colon cancer Brother    Cancer Paternal Grandmother        unknown   Diabetes Sister     Social History:  reports that she has never smoked. She has never used smokeless tobacco. She reports that she does not drink alcohol or use drugs.  ROS: A complete review of systems was performed.  All systems are negative except for pertinent findings as noted.  Physical Exam:  Vital signs in last 24 hours: Temp:  [98.8 F (37.1 C)-99.1 F (37.3 C)] 99.1 F (37.3 C) (11/24 1748) Pulse Rate:  [96-98] 96 (11/24 1748) Resp:  [18-20] 18 (11/24 1748) BP: (133-149)/(64-112) 133/112 (11/24 1748) SpO2:  [94 %-95 %] 94 % (11/24 1748) Weight:  [90.7 kg]  90.7 kg (11/24 1356) Constitutional:  Alert and oriented, No acute distress HEENT: [place] AT, moist mucus membranes.  Trachea midline, no masses Cardiovascular: Regular rate and rhythm, no clubbing, cyanosis, or edema. Respiratory: Normal respiratory effort, lungs clear bilaterally GI: Abdomen is soft, nontender, nondistended, no abdominal masses GU: No CVA tenderness Skin: No rashes, bruises or suspicious lesions Lymph: No cervical or inguinal adenopathy Neurologic: Grossly intact, no focal deficits, moving all 4 extremities Psychiatric: Normal mood and affect   Laboratory Data:  Recent Labs    02/18/19 1359  WBC 8.1  HGB 15.5*  HCT 44.5    Recent Labs    02/18/19 1359  NA 142  K 3.4*  CL 106  CO2 22  GLUCOSE 137*  BUN 22  CREATININE 0.86  CALCIUM 9.4     Radiologic Imaging: Ct Renal Stone Study  Result Date: 02/18/2019 CLINICAL DATA:  Left flank pain EXAM: CT ABDOMEN AND PELVIS WITHOUT CONTRAST TECHNIQUE: Multidetector CT imaging of the abdomen and pelvis was performed following the standard protocol without oral or IV contrast. COMPARISON:  December 19, 2012 FINDINGS: Lower chest: There is mild scarring in the lung bases. There is no lung base edema or consolidation. There is a small hiatal hernia. Hepatobiliary: No focal liver lesions are evident on this noncontrast enhanced study. There is cholelithiasis. There is no appreciable gallbladder wall thickening. There is no evident biliary duct dilatation. Pancreas: There is no pancreatic mass or inflammatory focus. Spleen: No splenic lesions are evident. A small splenule is noted anterior to the spleen. Adrenals/Urinary Tract: Adrenals bilaterally appear unremarkable. Left kidney is edematous. There is extensive soft tissue stranding and fluid in the perinephric fascia on the left. There is a 5 mm cyst arising from the lateral mid to lower right kidney. There is severe hydronephrosis on the left. There is no appreciable hydronephrosis on the right. No calculi evident in the right kidney. There is a calculus in the lower pole calyx region on the right measuring 8 x 8 mm. A nearby second calculus in this area measures 9 x 7 mm. There is a 1 mm calculus in the mid left kidney. There is a 2 mm calculus more inferiorly in the lower pole left kidney. There is a 3 x 2 mm calculus in the left renal pelvis. There is a calculus at the left ureteropelvic junction measuring 1.0 x 0.9 cm. No other ureteral calculi are evident on either side. Urinary bladder is midline with wall thickness within normal limits. Stomach/Bowel: There is no appreciable bowel wall or mesenteric thickening. There is  no appreciable bowel obstruction. The terminal ileum appears normal. There is no evident free air or portal venous air. Vascular/Lymphatic: There is no abdominal aortic aneurysm. Aorta is tortuous. There is mild calcification in the aorta and common iliac arteries. There is no appreciable adenopathy in the abdomen or pelvis. Reproductive: Uterus is absent. There is no pelvic mass evident. Other: There is no appendiceal region inflammation. There is no abscess or ascites in the abdomen or pelvis. Musculoskeletal: There is extensive degenerative type change throughout lower thoracic and lumbar regions. There is moderately severe spinal stenosis at L4-5 due to diffuse bony overgrowth. No blastic or lytic bone lesions are evident. There is fatty infiltration in the lateral aspect of the left tensor fascia lata muscle. No abdominal wall lesions are evident. IMPRESSION: 1. There is severe hydronephrosis on the left with extensive soft tissue stranding and fluid in the perinephric fascia on the left. There is  a calculus at the left ureteropelvic junction measuring 1.0 x 0.9 cm. Multiple calculi throughout the left kidney, primarily in the left lower pole region. 2. Cholelithiasis.  No appreciable gallbladder wall thickening 3. Small hiatal hernia. 4. Aortic atherosclerosis. 5. Severe degenerative type change throughout the lower thoracic and lumbar spine. There is moderately severe spinal stenosis at L4-5 due to diffuse bony overgrowth. 6. No bowel obstruction. No abscess in the abdomen or pelvis. No periappendiceal region inflammation. Aortic Atherosclerosis (ICD10-I70.0). Electronically Signed   By: Lowella Grip III M.D.   On: 02/18/2019 16:11    Assessment:  84 y.o. female with a 10 mm obstructing left UPJ calculus with severe hydronephrosis.  Urine with pyuria and is nitrite positive.  She has a prior history of sepsis requiring ICU admission for an obstructing renal calculus.  She has multiple nonobstructing  left renal calculi.  Recommendation:  -Urgent cystoscopy with placement left ureteral stent tonight.  The procedure was discussed in detail including potential risks of bleeding, sepsis, ureteral injury and inability to place the stent which would require percutaneous nephrostomy placement.  All questions were answered and she desires to proceed.  We reviewed that no attempt will be made to remove her stone due to infection and she will require a follow-up definitive procedure.  -Rapid Covid preop  -Admit to hospitalist service for IV antibiotics.   02/18/2019, 6:03 PM  John Giovanni,  MD

## 2019-02-19 ENCOUNTER — Telehealth: Payer: Self-pay | Admitting: Urology

## 2019-02-19 ENCOUNTER — Encounter: Payer: Self-pay | Admitting: Urology

## 2019-02-19 DIAGNOSIS — E1169 Type 2 diabetes mellitus with other specified complication: Secondary | ICD-10-CM

## 2019-02-19 DIAGNOSIS — E785 Hyperlipidemia, unspecified: Secondary | ICD-10-CM

## 2019-02-19 DIAGNOSIS — G4733 Obstructive sleep apnea (adult) (pediatric): Secondary | ICD-10-CM

## 2019-02-19 DIAGNOSIS — I5032 Chronic diastolic (congestive) heart failure: Secondary | ICD-10-CM | POA: Diagnosis not present

## 2019-02-19 DIAGNOSIS — N1330 Unspecified hydronephrosis: Secondary | ICD-10-CM

## 2019-02-19 DIAGNOSIS — E119 Type 2 diabetes mellitus without complications: Secondary | ICD-10-CM

## 2019-02-19 DIAGNOSIS — I10 Essential (primary) hypertension: Secondary | ICD-10-CM | POA: Diagnosis not present

## 2019-02-19 LAB — CBC
HCT: 38.9 % (ref 36.0–46.0)
Hemoglobin: 13.5 g/dL (ref 12.0–15.0)
MCH: 30.8 pg (ref 26.0–34.0)
MCHC: 34.7 g/dL (ref 30.0–36.0)
MCV: 88.8 fL (ref 80.0–100.0)
Platelets: 155 10*3/uL (ref 150–400)
RBC: 4.38 MIL/uL (ref 3.87–5.11)
RDW: 12.9 % (ref 11.5–15.5)
WBC: 15.9 10*3/uL — ABNORMAL HIGH (ref 4.0–10.5)
nRBC: 0 % (ref 0.0–0.2)

## 2019-02-19 LAB — BASIC METABOLIC PANEL
Anion gap: 10 (ref 5–15)
BUN: 26 mg/dL — ABNORMAL HIGH (ref 8–23)
CO2: 24 mmol/L (ref 22–32)
Calcium: 8.6 mg/dL — ABNORMAL LOW (ref 8.9–10.3)
Chloride: 107 mmol/L (ref 98–111)
Creatinine, Ser: 0.99 mg/dL (ref 0.44–1.00)
GFR calc Af Amer: >60 mL/min (ref >60)
GFR calc non Af Amer: 53 mL/min — ABNORMAL LOW (ref >60)
Glucose, Bld: 207 mg/dL — ABNORMAL HIGH (ref 70–99)
Potassium: 3.6 mmol/L (ref 3.5–5.1)
Sodium: 141 mmol/L (ref 135–145)

## 2019-02-19 LAB — GLUCOSE, CAPILLARY
Glucose-Capillary: 177 mg/dL — ABNORMAL HIGH (ref 70–99)
Glucose-Capillary: 154 mg/dL — ABNORMAL HIGH (ref 70–99)
Glucose-Capillary: 173 mg/dL — ABNORMAL HIGH (ref 70–99)
Glucose-Capillary: 131 mg/dL — ABNORMAL HIGH (ref 70–99)

## 2019-02-19 LAB — URINE CULTURE

## 2019-02-19 MED ORDER — LORATADINE 10 MG PO TABS
10.0000 mg | ORAL_TABLET | Freq: Every day | ORAL | Status: DC
  Administered 2019-02-19 – 2019-02-20 (×2): 10 mg via ORAL
  Filled 2019-02-19 (×2): qty 1

## 2019-02-19 MED ORDER — ACETAMINOPHEN 325 MG PO TABS
650.0000 mg | ORAL_TABLET | Freq: Four times a day (QID) | ORAL | Status: DC | PRN
  Administered 2019-02-19: 650 mg via ORAL
  Filled 2019-02-19: qty 2

## 2019-02-19 MED ORDER — ENOXAPARIN SODIUM 40 MG/0.4ML ~~LOC~~ SOLN: 40.0000 mg | SUBCUTANEOUS | Status: DC

## 2019-02-19 MED ORDER — CHLORHEXIDINE GLUCONATE CLOTH 2 % EX PADS
6.0000 | MEDICATED_PAD | Freq: Every day | CUTANEOUS | Status: DC
  Administered 2019-02-19 – 2019-02-20 (×2): 6 via TOPICAL

## 2019-02-19 NOTE — Telephone Encounter (Signed)
Mrs. Brignac will need an appointment with Dr. Bernardo Heater once she is discharged to discuss definitive stone management.

## 2019-02-19 NOTE — Anesthesia Postprocedure Evaluation (Signed)
Anesthesia Post Note  Patient: Gabriella Howe  Procedure(s) Performed: CYSTOSCOPY WITH STENT PLACEMENT (Left Ureter)  Patient location during evaluation: PACU Anesthesia Type: General Level of consciousness: awake and alert Pain management: pain level controlled Vital Signs Assessment: post-procedure vital signs reviewed and stable Respiratory status: spontaneous breathing, nonlabored ventilation, respiratory function stable and patient connected to nasal cannula oxygen Cardiovascular status: blood pressure returned to baseline and stable Postop Assessment: no apparent nausea or vomiting Anesthetic complications: no     Last Vitals:  Vitals:   02/19/19 0058 02/19/19 0539  BP: (!) 126/57 (!) 120/49  Pulse: 89 89  Resp: 16 20  Temp: 37.1 C 37.2 C  SpO2: 96% 93%    Last Pain:  Vitals:   02/19/19 0632  TempSrc:   PainSc: [place]

## 2019-02-19 NOTE — Progress Notes (Signed)
Urology Consult Follow Up  Subjective: Patient underwent left ureteral stent placement last night for a 10 mm obstructing left UPJ calculus with severe hydronephrosis associated with grossly infected urine.  She is sitting up in the chair and has no complaints at this time.  Her daughter drove in from [place] last evening to spend Thanksgiving weekend with her and she is anxious to get home.  WBC count is up to 15.9 from 8.1 yesterday which is expected after undergoing stent placement.  Serum creatinine also took a little bump from 0.86-0.99 which is also expected after a stent manipulation.    Urine is clear yellow in the Foley.  She is afebrile and her vital signs are stable.  Good urine output. Her urine culture is pending.  Anti-infectives: Anti-infectives (From admission, onward)   Start     Dose/Rate Route Frequency Ordered Stop   02/19/19 1800  cefTRIAXone (ROCEPHIN) 1 g in sodium chloride 0.9 % 100 mL IVPB     1 g 200 mL/hr over 30 Minutes Intravenous Every 24 hours 02/18/19 2214     02/18/19 1730  cefTRIAXone (ROCEPHIN) 1 g in sodium chloride 0.9 % 100 mL IVPB     1 g 200 mL/hr over 30 Minutes Intravenous  Once 02/18/19 1716 02/18/19 1930      Current Facility-Administered Medications  Medication Dose Route Frequency Provider Last Rate Last Dose  . 0.9 %  sodium chloride infusion   Intravenous Continuous Annita Brod, MD 50 mL/hr at 02/19/19 0544    . cefTRIAXone (ROCEPHIN) 1 g in sodium chloride 0.9 % 100 mL IVPB  1 g Intravenous Q24H Annita Brod, MD      . Chlorhexidine Gluconate Cloth 2 % PADS 6 each  6 each Topical Daily Annita Brod, MD   6 each at 02/19/19 [phone]  . insulin aspart (novoLOG) injection 0-5 Units  0-5 Units Subcutaneous QHS Gevena Barre K, MD      . insulin aspart (novoLOG) injection 0-9 Units  0-9 Units Subcutaneous TID WC Annita Brod, MD   2 Units at 02/19/19 0800  . morphine 2 MG/ML injection 2 mg  2 mg Intravenous Q3H PRN  Abbie Sons, MD   2 mg at 02/19/19 [id number]  . ondansetron (ZOFRAN) injection 4 mg  4 mg Intravenous Q6H PRN Stoioff, Scott C, MD   4 mg at 02/19/19 0046  . sodium chloride flush (NS) 0.9 % injection 3 mL  3 mL Intravenous Q12H Annita Brod, MD   3 mL at 02/18/19 2217     Objective: Vital signs in last 24 hours: Temp:  [97.5 F (36.4 C)-99.1 F (37.3 C)] 99 F (37.2 C) (11/25 0539) Pulse Rate:  [87-99] 89 (11/25 0539) Resp:  [14-20] 20 (11/25 0539) BP: (101-149)/(49-112) 120/49 (11/25 0539) SpO2:  [91 %-100 %] 93 % (11/25 0539) Weight:  [90.7 kg] 90.7 kg (11/24 1356)  Intake/Output from previous day: 11/24 0701 - 11/25 0700 In: 1210.2 [P.O.:240; I.V.:970.2] Out: 500 [Urine:500] Intake/Output this shift: No intake/output data recorded.   Physical Exam Constitutional:  Well nourished. Alert and oriented, No acute distress. HEENT: [place] AT, moist mucus membranes.  Trachea midline, no masses. Cardiovascular: No clubbing, cyanosis, or edema. Respiratory: Normal respiratory effort, no increased work of breathing. GI: Abdomen is soft, non tender, non distended, no abdominal masses. Liver and spleen not palpable.  No hernias appreciated.  Stool sample for occult testing is not indicated.   GU: No CVA tenderness.  No bladder  fullness or masses.   Neurologic: Grossly intact, no focal deficits, moving all 4 extremities. Psychiatric: Normal mood and affect.  Lab Results:  Recent Labs    02/18/19 1359 02/19/19 0424  WBC 8.1 15.9*  HGB 15.5* 13.5  HCT 44.5 38.9  PLT 161 155   BMET Recent Labs    02/18/19 1359 02/19/19 0424  NA 142 141  K 3.4* 3.6  CL 106 107  CO2 22 24  GLUCOSE 137* 207*  BUN 22 26*  CREATININE 0.86 0.99  CALCIUM 9.4 8.6*   PT/INR No results for input(s): LABPROT, INR in the last 72 hours. ABG No results for input(s): PHART, HCO3 in the last 72 hours.  Invalid input(s): PCO2, PO2  Studies/Results: Ct Renal Stone Study  Result Date:  02/18/2019 CLINICAL DATA:  Left flank pain EXAM: CT ABDOMEN AND PELVIS WITHOUT CONTRAST TECHNIQUE: Multidetector CT imaging of the abdomen and pelvis was performed following the standard protocol without oral or IV contrast. COMPARISON:  December 19, 2012 FINDINGS: Lower chest: There is mild scarring in the lung bases. There is no lung base edema or consolidation. There is a small hiatal hernia. Hepatobiliary: No focal liver lesions are evident on this noncontrast enhanced study. There is cholelithiasis. There is no appreciable gallbladder wall thickening. There is no evident biliary duct dilatation. Pancreas: There is no pancreatic mass or inflammatory focus. Spleen: No splenic lesions are evident. A small splenule is noted anterior to the spleen. Adrenals/Urinary Tract: Adrenals bilaterally appear unremarkable. Left kidney is edematous. There is extensive soft tissue stranding and fluid in the perinephric fascia on the left. There is a 5 mm cyst arising from the lateral mid to lower right kidney. There is severe hydronephrosis on the left. There is no appreciable hydronephrosis on the right. No calculi evident in the right kidney. There is a calculus in the lower pole calyx region on the right measuring 8 x 8 mm. A nearby second calculus in this area measures 9 x 7 mm. There is a 1 mm calculus in the mid left kidney. There is a 2 mm calculus more inferiorly in the lower pole left kidney. There is a 3 x 2 mm calculus in the left renal pelvis. There is a calculus at the left ureteropelvic junction measuring 1.0 x 0.9 cm. No other ureteral calculi are evident on either side. Urinary bladder is midline with wall thickness within normal limits. Stomach/Bowel: There is no appreciable bowel wall or mesenteric thickening. There is no appreciable bowel obstruction. The terminal ileum appears normal. There is no evident free air or portal venous air. Vascular/Lymphatic: There is no abdominal aortic aneurysm. Aorta is  tortuous. There is mild calcification in the aorta and common iliac arteries. There is no appreciable adenopathy in the abdomen or pelvis. Reproductive: Uterus is absent. There is no pelvic mass evident. Other: There is no appendiceal region inflammation. There is no abscess or ascites in the abdomen or pelvis. Musculoskeletal: There is extensive degenerative type change throughout lower thoracic and lumbar regions. There is moderately severe spinal stenosis at L4-5 due to diffuse bony overgrowth. No blastic or lytic bone lesions are evident. There is fatty infiltration in the lateral aspect of the left tensor fascia lata muscle. No abdominal wall lesions are evident. IMPRESSION: 1. There is severe hydronephrosis on the left with extensive soft tissue stranding and fluid in the perinephric fascia on the left. There is a calculus at the left ureteropelvic junction measuring 1.0 x 0.9 cm. Multiple calculi throughout  the left kidney, primarily in the left lower pole region. 2. Cholelithiasis.  No appreciable gallbladder wall thickening 3. Small hiatal hernia. 4. Aortic atherosclerosis. 5. Severe degenerative type change throughout the lower thoracic and lumbar spine. There is moderately severe spinal stenosis at L4-5 due to diffuse bony overgrowth. 6. No bowel obstruction. No abscess in the abdomen or pelvis. No periappendiceal region inflammation. Aortic Atherosclerosis (ICD10-I70.0). Electronically Signed   By: Lowella Grip III M.D.   On: 02/18/2019 16:11     Assessment and Plan:  10 mm obstructing left UPJ calculus with hydronephrosis and grossly infected urine S/P left ureteral stent on 02/18/2019 Urine culture is pending Continue broad-spectrum antibiotics until urine culture is available and then antibiotics can be narrowed pending sensitivities Foley may be discontinued once afebrile and clinically stable for 24 hours Will need outpatient appointment to discuss definitive management of stone once  she is discharged Urology to sign off, please page or call with any concerns.          LOS: 0 days    [facility] 02/19/2019

## 2019-02-19 NOTE — Progress Notes (Signed)
PROGRESS NOTE    Gabriella Howe  [id number] DOB: Aug 21, 1934 DOA: 02/18/2019 PCP: Abner Greenspan, MD   Brief Narrative:  Gabriella Howe is a 84 y.o. female with medical history significant for diastolic heart failure, diabetes mellitus and hypertension who was in her usual state of health when she woke up approximately 5:00 this morning with fever and chills and left flank pain.  She became concerned so she came into the emergency room.  In the emergency room, patient was noted to have a normal white count and no fever and blood pressure was stable.  However, CT scan of abdomen/pelvis noted severe hydronephrosis on the left with extensive soft tissue stranding and fluid in the perinephric fascia on the left as well as a 1 x 0.9 cm renal stone in the left ureteropelvic junction looking to be obstructive.  Patient also found to have a very large UTI.  She was given fluids and antibiotics plus medication for pain.  Urology was consulted who plan to take patient later for stone extraction.  Hospitalist were called for further evaluation.   Assessment & Plan:   Principal Problem:   Hydronephrosis of left kidney Active Problems:   Diabetes type 2, controlled ([place])   Hyperlipidemia associated with type 2 diabetes mellitus ([place])   Morbid obesity ([place])   Essential hypertension   DIASTOLIC HEART FAILURE, CHRONIC   OSA (obstructive sleep apnea)   UTI (urinary tract infection)    Hyperlipidemia associated with type 2 diabetes mellitus ([place]): Stable.  sliding scale only for now.  Holding oral Metformin  - Essential hypertension: Blood pressure stable. - DIASTOLIC HEART FAILURE, CHRONIC: Euvolemic.  Gentle IV fluids. - Hydronephrosis of left kidney secondary to obstructing renal stone.  Patient has not had a obstructing renal stone in almost 7 years.  Patient seen by urology taken to the OR status post stent.  Will need follow-up with urology per urology team - Morbid obesity [facility]): Meets  criteria with BMI greater than 35+ hypertension and heart failure.,  Provided weight loss counseling - UTI (urinary tract infection)/left-sided pyelonephritis: Secondary to obstructed stone.  IV Rocephin.  Urine culture pending discharged with antibiotics once speciation and sensitivities available   DVT prophylaxis: Lovenox SQ  Code Status: full code    Code Status Orders  (From admission, onward)         Start     Ordered   02/18/19 1845  Do not attempt resuscitation (DNR)  Continuous    Question Answer Comment  In the event of cardiac or respiratory ARREST Do not call a code blue   In the event of cardiac or respiratory ARREST Do not perform Intubation, CPR, defibrillation or ACLS   In the event of cardiac or respiratory ARREST Use medication by any route, position, wound care, and other measures to relive pain and suffering. May use oxygen, suction and manual treatment of airway obstruction as needed for comfort.      02/18/19 1844        Code Status History    This patient has a current code status but no historical code status.   Advance Care Planning Activity    Advance Directive Documentation     Most Recent Value  Type of Advance Directive  Healthcare Power of Attorney, Living will  Pre-existing out of facility DNR order (yellow form or pink MOST form)  --  "MOST" Form in Place?  --     Family Communication: Discussed in detail with patient Disposition  Plan:   Patient remained inpatient for continued postoperative treatment for stent, IV antibiotics, patient not yet ready for or stable for medical discharge Consults called: Urology Admission status: Observation   Consultants:   Urology  Procedures:  Ct Renal Stone Study  Result Date: 02/18/2019 CLINICAL DATA:  Left flank pain EXAM: CT ABDOMEN AND PELVIS WITHOUT CONTRAST TECHNIQUE: Multidetector CT imaging of the abdomen and pelvis was performed following the standard protocol without oral or IV contrast.  COMPARISON:  December 19, 2012 FINDINGS: Lower chest: There is mild scarring in the lung bases. There is no lung base edema or consolidation. There is a small hiatal hernia. Hepatobiliary: No focal liver lesions are evident on this noncontrast enhanced study. There is cholelithiasis. There is no appreciable gallbladder wall thickening. There is no evident biliary duct dilatation. Pancreas: There is no pancreatic mass or inflammatory focus. Spleen: No splenic lesions are evident. A small splenule is noted anterior to the spleen. Adrenals/Urinary Tract: Adrenals bilaterally appear unremarkable. Left kidney is edematous. There is extensive soft tissue stranding and fluid in the perinephric fascia on the left. There is a 5 mm cyst arising from the lateral mid to lower right kidney. There is severe hydronephrosis on the left. There is no appreciable hydronephrosis on the right. No calculi evident in the right kidney. There is a calculus in the lower pole calyx region on the right measuring 8 x 8 mm. A nearby second calculus in this area measures 9 x 7 mm. There is a 1 mm calculus in the mid left kidney. There is a 2 mm calculus more inferiorly in the lower pole left kidney. There is a 3 x 2 mm calculus in the left renal pelvis. There is a calculus at the left ureteropelvic junction measuring 1.0 x 0.9 cm. No other ureteral calculi are evident on either side. Urinary bladder is midline with wall thickness within normal limits. Stomach/Bowel: There is no appreciable bowel wall or mesenteric thickening. There is no appreciable bowel obstruction. The terminal ileum appears normal. There is no evident free air or portal venous air. Vascular/Lymphatic: There is no abdominal aortic aneurysm. Aorta is tortuous. There is mild calcification in the aorta and common iliac arteries. There is no appreciable adenopathy in the abdomen or pelvis. Reproductive: Uterus is absent. There is no pelvic mass evident. Other: There is no  appendiceal region inflammation. There is no abscess or ascites in the abdomen or pelvis. Musculoskeletal: There is extensive degenerative type change throughout lower thoracic and lumbar regions. There is moderately severe spinal stenosis at L4-5 due to diffuse bony overgrowth. No blastic or lytic bone lesions are evident. There is fatty infiltration in the lateral aspect of the left tensor fascia lata muscle. No abdominal wall lesions are evident. IMPRESSION: 1. There is severe hydronephrosis on the left with extensive soft tissue stranding and fluid in the perinephric fascia on the left. There is a calculus at the left ureteropelvic junction measuring 1.0 x 0.9 cm. Multiple calculi throughout the left kidney, primarily in the left lower pole region. 2. Cholelithiasis.  No appreciable gallbladder wall thickening 3. Small hiatal hernia. 4. Aortic atherosclerosis. 5. Severe degenerative type change throughout the lower thoracic and lumbar spine. There is moderately severe spinal stenosis at L4-5 due to diffuse bony overgrowth. 6. No bowel obstruction. No abscess in the abdomen or pelvis. No periappendiceal region inflammation. Aortic Atherosclerosis (ICD10-I70.0). Electronically Signed   By: Lowella Grip III M.D.   On: 02/18/2019 16:11  Antimicrobials:   Ceftriaxone 1 g every 24 hours   Subjective: Status post stent placement late last night.  Denies any pain  Objective: Vitals:   02/18/19 2244 02/19/19 0058 02/19/19 0539 02/19/19 1349  BP: (!) 105/51 (!) 126/57 (!) 120/49 (!) 109/53  Pulse: 87 89 89 85  Resp: 16 16 20    Temp: (!) 97.5 F (36.4 C) 98.7 F (37.1 C) 99 F (37.2 C) 99.2 F (37.3 C)  TempSrc: Oral Oral Oral Oral  SpO2: 94% 96% 93% 91%  Weight:      Height:        Intake/Output Summary (Last 24 hours) at 02/19/2019 1507 Last data filed at 02/19/2019 0544 Gross per 24 hour  Intake 1210.18 ml  Output 500 ml  Net 710.18 ml   Filed Weights   02/18/19 1356   Weight: 90.7 kg    Examination:  General exam: Appears calm and comfortable  Respiratory system: Clear to auscultation. Respiratory effort normal. Cardiovascular system: S1 & S2 heard, RRR. No JVD, murmurs, rubs, gallops or clicks. No pedal edema. Gastrointestinal system: Abdomen is nondistended, soft and nontender. No organomegaly or masses felt. Normal bowel sounds heard. Central nervous system: Alert and oriented. No focal neurological deficits. Extremities: Warm well perfused no edema. Skin: No rashes, lesions or ulcers Psychiatry: Judgement and insight appear normal. Mood & affect appropriate.     Data Reviewed: I have personally reviewed following labs and imaging studies  CBC: Recent Labs  Lab 02/18/19 1359 02/19/19 0424  WBC 8.1 15.9*  HGB 15.5* 13.5  HCT 44.5 38.9  MCV 88.5 88.8  PLT 161 [id number]   Basic Metabolic Panel: Recent Labs  Lab 02/18/19 1359 02/19/19 0424  NA 142 141  K 3.4* 3.6  CL 106 107  CO2 22 24  GLUCOSE 137* 207*  BUN 22 26*  CREATININE 0.86 0.99  CALCIUM 9.4 8.6*   GFR: Estimated Creatinine Clearance: 45 mL/min (by C-G formula based on SCr of 0.99 mg/dL). Liver Function Tests: Recent Labs  Lab 02/18/19 1359  AST 24  ALT 19  ALKPHOS 60  BILITOT 2.0*  PROT 7.0  ALBUMIN 4.1   Recent Labs  Lab 02/18/19 1359  LIPASE 11   No results for input(s): AMMONIA in the last 168 hours. Coagulation Profile: No results for input(s): INR, PROTIME in the last 168 hours. Cardiac Enzymes: No results for input(s): CKTOTAL, CKMB, CKMBINDEX, TROPONINI in the last 168 hours. BNP (last 3 results) No results for input(s): PROBNP in the last 8760 hours. HbA1C: No results for input(s): HGBA1C in the last 72 hours. CBG: Recent Labs  Lab 02/18/19 1951 02/18/19 2111 02/18/19 2226 02/19/19 0744 02/19/19 1135  GLUCAP 198* 184* 198* 177* 154*   Lipid Profile: No results for input(s): CHOL, HDL, LDLCALC, TRIG, CHOLHDL, LDLDIRECT in the last 72  hours. Thyroid Function Tests: No results for input(s): TSH, T4TOTAL, FREET4, T3FREE, THYROIDAB in the last 72 hours. Anemia Panel: No results for input(s): VITAMINB12, FOLATE, FERRITIN, TIBC, IRON, RETICCTPCT in the last 72 hours. Sepsis Labs: No results for input(s): PROCALCITON, LATICACIDVEN in the last 168 hours.  Recent Results (from the past 240 hour(s))  SARS Coronavirus 2 by RT PCR (hospital order, performed in [facility] hospital lab) Nasopharyngeal Nasopharyngeal Swab     Status: None   Collection Time: 02/18/19  5:26 PM   Specimen: Nasopharyngeal Swab  Result Value Ref Range Status   SARS Coronavirus 2 NEGATIVE NEGATIVE Final    Comment: (NOTE) SARS-CoV-2 target nucleic acids  are NOT DETECTED. The SARS-CoV-2 RNA is generally detectable in upper and lower respiratory specimens during the acute phase of infection. The lowest concentration of SARS-CoV-2 viral copies this assay can detect is 250 copies / mL. A negative result does not preclude SARS-CoV-2 infection and should not be used as the sole basis for treatment or other patient management decisions.  A negative result may occur with improper specimen collection / handling, submission of specimen other than nasopharyngeal swab, presence of viral mutation(s) within the areas targeted by this assay, and inadequate number of viral copies (<250 copies / mL). A negative result must be combined with clinical observations, patient history, and epidemiological information. Fact Sheet for Patients:   StrictlyIdeas.no Fact Sheet for Healthcare Providers: BankingDealers.co.za This test is not yet approved or cleared  by the [place] FDA and has been authorized for detection and/or diagnosis of SARS-CoV-2 by FDA under an Emergency Use Authorization (EUA).  This EUA will remain in effect (meaning this test can be used) for the duration of the COVID-19 declaration under Section  564(b)(1) of the Act, 21 U.S.C. section 360bbb-3(b)(1), unless the authorization is terminated or revoked sooner. Performed at [facility], [address], [place], [place] [address]   Urine Culture     Status: None (Preliminary result)   Collection Time: 02/18/19  8:44 PM   Specimen: PATH Other; Urine  Result Value Ref Range Status   Specimen Description   Final    URINE, RANDOM Performed at [facility], [address] [place], [place] [address]    Special Requests   Final    LEFT RENAL PELVIS ON CUP Performed at [facility], [place] [address], [place], [place] [address]    Culture PENDING  Incomplete   Report Status PENDING  Incomplete         Radiology Studies: Ct Renal Stone Study  Result Date: 02/18/2019 CLINICAL DATA:  Left flank pain EXAM: CT ABDOMEN AND PELVIS WITHOUT CONTRAST TECHNIQUE: Multidetector CT imaging of the abdomen and pelvis was performed following the standard protocol without oral or IV contrast. COMPARISON:  December 19, 2012 FINDINGS: Lower chest: There is mild scarring in the lung bases. There is no lung base edema or consolidation. There is a small hiatal hernia. Hepatobiliary: No focal liver lesions are evident on this noncontrast enhanced study. There is cholelithiasis. There is no appreciable gallbladder wall thickening. There is no evident biliary duct dilatation. Pancreas: There is no pancreatic mass or inflammatory focus. Spleen: No splenic lesions are evident. A small splenule is noted anterior to the spleen. Adrenals/Urinary Tract: Adrenals bilaterally appear unremarkable. Left kidney is edematous. There is extensive soft tissue stranding and fluid in the perinephric fascia on the left. There is a 5 mm cyst arising from the lateral mid to lower right kidney. There is severe hydronephrosis on the left. There is no appreciable hydronephrosis on the right. No calculi evident in the right kidney. There is a calculus in the lower  pole calyx region on the right measuring 8 x 8 mm. A nearby second calculus in this area measures 9 x 7 mm. There is a 1 mm calculus in the mid left kidney. There is a 2 mm calculus more inferiorly in the lower pole left kidney. There is a 3 x 2 mm calculus in the left renal pelvis. There is a calculus at the left ureteropelvic junction measuring 1.0 x 0.9 cm. No other ureteral calculi are evident on either side. Urinary bladder  is midline with wall thickness within normal limits. Stomach/Bowel: There is no appreciable bowel wall or mesenteric thickening. There is no appreciable bowel obstruction. The terminal ileum appears normal. There is no evident free air or portal venous air. Vascular/Lymphatic: There is no abdominal aortic aneurysm. Aorta is tortuous. There is mild calcification in the aorta and common iliac arteries. There is no appreciable adenopathy in the abdomen or pelvis. Reproductive: Uterus is absent. There is no pelvic mass evident. Other: There is no appendiceal region inflammation. There is no abscess or ascites in the abdomen or pelvis. Musculoskeletal: There is extensive degenerative type change throughout lower thoracic and lumbar regions. There is moderately severe spinal stenosis at L4-5 due to diffuse bony overgrowth. No blastic or lytic bone lesions are evident. There is fatty infiltration in the lateral aspect of the left tensor fascia lata muscle. No abdominal wall lesions are evident. IMPRESSION: 1. There is severe hydronephrosis on the left with extensive soft tissue stranding and fluid in the perinephric fascia on the left. There is a calculus at the left ureteropelvic junction measuring 1.0 x 0.9 cm. Multiple calculi throughout the left kidney, primarily in the left lower pole region. 2. Cholelithiasis.  No appreciable gallbladder wall thickening 3. Small hiatal hernia. 4. Aortic atherosclerosis. 5. Severe degenerative type change throughout the lower thoracic and lumbar spine. There  is moderately severe spinal stenosis at L4-5 due to diffuse bony overgrowth. 6. No bowel obstruction. No abscess in the abdomen or pelvis. No periappendiceal region inflammation. Aortic Atherosclerosis (ICD10-I70.0). Electronically Signed   By: Lowella Grip III M.D.   On: 02/18/2019 16:11        Scheduled Meds:  Chlorhexidine Gluconate Cloth  6 each Topical Daily   enoxaparin (LOVENOX) injection  40 mg Subcutaneous Q24H   insulin aspart  0-5 Units Subcutaneous QHS   insulin aspart  0-9 Units Subcutaneous TID WC   loratadine  10 mg Oral Daily   sodium chloride flush  3 mL Intravenous Q12H   Continuous Infusions:  sodium chloride 50 mL/hr at 02/19/19 0544   cefTRIAXone (ROCEPHIN)  IV       LOS: 0 days    Time spent: 35 min    Nicolette Bang, MD [facility]  If 7PM-7AM, please contact night-coverage  02/19/2019, 3:07 PM

## 2019-02-20 DIAGNOSIS — I5032 Chronic diastolic (congestive) heart failure: Secondary | ICD-10-CM | POA: Diagnosis not present

## 2019-02-20 DIAGNOSIS — E1169 Type 2 diabetes mellitus with other specified complication: Secondary | ICD-10-CM | POA: Diagnosis not present

## 2019-02-20 DIAGNOSIS — E119 Type 2 diabetes mellitus without complications: Secondary | ICD-10-CM | POA: Diagnosis not present

## 2019-02-20 DIAGNOSIS — I10 Essential (primary) hypertension: Secondary | ICD-10-CM | POA: Diagnosis not present

## 2019-02-20 DIAGNOSIS — N1330 Unspecified hydronephrosis: Secondary | ICD-10-CM | POA: Diagnosis not present

## 2019-02-20 LAB — GLUCOSE, CAPILLARY
Glucose-Capillary: 154 mg/dL — ABNORMAL HIGH (ref 70–99)
Glucose-Capillary: 131 mg/dL — ABNORMAL HIGH (ref 70–99)

## 2019-02-20 LAB — BASIC METABOLIC PANEL
Anion gap: 10 (ref 5–15)
BUN: 17 mg/dL (ref 8–23)
CO2: 25 mmol/L (ref 22–32)
Calcium: 8.7 mg/dL — ABNORMAL LOW (ref 8.9–10.3)
Chloride: 105 mmol/L (ref 98–111)
Creatinine, Ser: 0.81 mg/dL (ref 0.44–1.00)
GFR calc Af Amer: >60 mL/min (ref >60)
GFR calc non Af Amer: >60 mL/min (ref >60)
Glucose, Bld: 173 mg/dL — ABNORMAL HIGH (ref 70–99)
Potassium: 3.2 mmol/L — ABNORMAL LOW (ref 3.5–5.1)
Sodium: 140 mmol/L (ref 135–145)

## 2019-02-20 LAB — CBC WITH DIFFERENTIAL/PLATELET
Abs Immature Granulocytes: 0.1 10*3/uL — ABNORMAL HIGH (ref 0.00–0.07)
Basophils Absolute: 0 10*3/uL (ref 0.0–0.1)
Basophils Relative: 0 %
Eosinophils Absolute: 0.1 10*3/uL (ref 0.0–0.5)
Eosinophils Relative: 1 %
HCT: 37.9 % (ref 36.0–46.0)
Hemoglobin: 13.1 g/dL (ref 12.0–15.0)
Immature Granulocytes: 1 %
Lymphocytes Relative: 7 %
Lymphs Abs: 0.8 10*3/uL (ref 0.7–4.0)
MCH: 30.7 pg (ref 26.0–34.0)
MCHC: 34.6 g/dL (ref 30.0–36.0)
MCV: 88.8 fL (ref 80.0–100.0)
Monocytes Absolute: 0.6 10*3/uL (ref 0.1–1.0)
Monocytes Relative: 5 %
Neutro Abs: 9.9 10*3/uL — ABNORMAL HIGH (ref 1.7–7.7)
Neutrophils Relative %: 86 %
Platelets: 133 10*3/uL — ABNORMAL LOW (ref 150–400)
RBC: 4.27 MIL/uL (ref 3.87–5.11)
RDW: 12.9 % (ref 11.5–15.5)
WBC: 11.5 10*3/uL — ABNORMAL HIGH (ref 4.0–10.5)
nRBC: 0 % (ref 0.0–0.2)

## 2019-02-20 MED ORDER — CEFDINIR 300 MG PO CAPS: 300.0000 mg | ORAL_CAPSULE | Freq: Two times a day (BID) | ORAL | 0 refills | Status: AC

## 2019-02-20 NOTE — Discharge Summary (Signed)
Physician Discharge Summary  Gabriella Howe [id number] DOB: 10/30/1934 DOA: 02/18/2019  PCP: Abner Greenspan, MD  Admit date: 02/18/2019 Discharge date: 02/20/2019  Admitted From: Inpatient Disposition: home  Recommendations for Outpatient Follow-up:  1. Follow up with PCP in 1-2 weeks 2. Follow-up with urology in 1 to 2 weeks  Home Health:No Equipment/Devices:none  Discharge Condition:Stable CODE STATUS:Full code Diet recommendation: Diabetic diet  Brief/Interim Summary: Gabriella Howe a 84 y.o.femalewith medical history significant fordiastolic heart failure, diabetes mellitus and hypertension who was in her usual state of health when she woke up approximately 5:00 this morning with fever and chills and left flank pain. She became concerned so she came into the emergency room. In the emergency room, patient was noted to have a normal white count and no fever and blood pressure was stable. However, CT scan of abdomen/pelvis noted severe hydronephrosis on the left with extensive soft tissue stranding and fluid in the perinephric fascia on the left as well as a 1 x 0.9 cm renal stone in the left ureteropelvic junction looking to be obstructive. Patient also found to have a very large UTI. She was given fluids and antibiotics plus medication for pain. Urology was consulted who plan to take patient later for stone extraction. Hospitalist were called for further evaluation.   Hospital Course: Urinary tract infection with left-sided pyelonephritis and obstructive stone.  Patient was placed on antibiotics.  She was seen by urology with cystoscopy placement of 6 French left ureteral stent.  Patient was also placed on antibiotics.  Urine culture was obtained.  Unfortunately urine cultures suggest recollection.  I discussed this with the patient and she did not want remain in the hospital as she had family who have driven from [place] to see her.  We had an extensive discussion of  continuing an antibiotic in the same class as she appeared to have an appropriate response.  She agreed to continue that antibiotic, and follow-up with urology.  Again patient refused to stay in the hospital for further work-up and collection.  Follow-up with [facility] urological within 1 to 2 weeks after discharge.  While in the hospital continue patient's home medications for hypertension chronic diastolic heart failure.  She was stable not in exacerbation.  She also received dietary education and nutritional education because of morbid obesity. On the day of discharge patient is afebrile vital signs are stable.  Discharge Diagnoses:  Principal Problem:   Hydronephrosis of left kidney Active Problems:   Diabetes type 2, controlled ([place])   Hyperlipidemia associated with type 2 diabetes mellitus (HCC)   Morbid obesity ([place])   Essential hypertension   DIASTOLIC HEART FAILURE, CHRONIC   OSA (obstructive sleep apnea)   UTI (urinary tract infection)    Discharge Instructions  Discharge Instructions    Call MD for:  difficulty breathing, headache or visual disturbances   Complete by: As directed    Call MD for:  extreme fatigue   Complete by: As directed    Call MD for:  hives   Complete by: As directed    Call MD for:  persistant dizziness or light-headedness   Complete by: As directed    Call MD for:  persistant nausea and vomiting   Complete by: As directed    Call MD for:  severe uncontrolled pain   Complete by: As directed    Call MD for:  temperature >100.4   Complete by: As directed    Diet - low sodium heart healthy  Complete by: As directed    Increase activity slowly   Complete by: As directed      Allergies as of 02/20/2019      Reactions   Rofecoxib    REACTION: reaction not known   Tetanus-diphtheria Toxoids Td    REACTION: sick with fever   Antihistamines, Chlorpheniramine-type Palpitations   Sulfa Antibiotics Rash      Medication List    TAKE these  medications   B-12 PO Take 1 tablet by mouth daily.   BC FAST PAIN RELIEF PO Take by mouth as directed.   benazepril 20 MG tablet Commonly known as: LOTENSIN Take 1 tablet (20 mg total) by mouth daily.   CALCIUM-VITAMIN D PO Take 1 tablet by mouth 2 (two) times daily.   cefdinir 300 MG capsule Commonly known as: OMNICEF Take 1 capsule (300 mg total) by mouth 2 (two) times daily for 7 days.   FISH OIL PO Take 1 capsule by mouth daily.   furosemide 40 MG tablet Commonly known as: LASIX Take 0.5 tablets (20 mg total) by mouth daily.   glipiZIDE 5 MG 24 hr tablet Commonly known as: GLUCOTROL XL Take 1 tablet (5 mg total) by mouth daily with breakfast.   glucose blood test strip Commonly known as: Estate manager/land agent Use to check blood sugar once daily as directed for DM (Dx. E11.9)   ibuprofen 200 MG tablet Commonly known as: ADVIL Take 200 mg by mouth every 6 (six) hours as needed.   Magnesium 250 MG Tabs Take 250 mg by mouth daily.   metFORMIN 1000 MG tablet Commonly known as: GLUCOPHAGE Take 1 tablet (1,000 mg total) by mouth 2 (two) times daily with a meal.   Microlet Lancets Misc Use to check blood sugar once daily as directed for DM (Dx. E11.9)   multivitamin capsule Take 1 capsule by mouth daily.   omeprazole 20 MG capsule Commonly known as: PRILOSEC Take 1 capsule (20 mg total) by mouth daily.   simvastatin 40 MG tablet Commonly known as: ZOCOR Take 1 tablet (40 mg total) by mouth at bedtime.   SINUS & ALLERGY 12 HOUR PO Take by mouth as directed.   triamcinolone cream 0.1 % Commonly known as: KENALOG APPLY TO AFFECTED AREA(S)  TOPICALLY TWO TIMES A DAY   vitamin C 500 MG tablet Commonly known as: ASCORBIC ACID Take 500 mg by mouth daily.   vitamin E 400 UNIT capsule Take 400 Units by mouth daily.       Allergies  Allergen Reactions  . Rofecoxib     REACTION: reaction not known  . Tetanus-Diphtheria Toxoids Td     REACTION: sick with  fever  . Antihistamines, Chlorpheniramine-Type Palpitations  . Sulfa Antibiotics Rash    Consultations:  urology   Procedures/Studies: Ct Renal Stone Study  Result Date: 02/18/2019 CLINICAL DATA:  Left flank pain EXAM: CT ABDOMEN AND PELVIS WITHOUT CONTRAST TECHNIQUE: Multidetector CT imaging of the abdomen and pelvis was performed following the standard protocol without oral or IV contrast. COMPARISON:  December 19, 2012 FINDINGS: Lower chest: There is mild scarring in the lung bases. There is no lung base edema or consolidation. There is a small hiatal hernia. Hepatobiliary: No focal liver lesions are evident on this noncontrast enhanced study. There is cholelithiasis. There is no appreciable gallbladder wall thickening. There is no evident biliary duct dilatation. Pancreas: There is no pancreatic mass or inflammatory focus. Spleen: No splenic lesions are evident. A small splenule is noted anterior  to the spleen. Adrenals/Urinary Tract: Adrenals bilaterally appear unremarkable. Left kidney is edematous. There is extensive soft tissue stranding and fluid in the perinephric fascia on the left. There is a 5 mm cyst arising from the lateral mid to lower right kidney. There is severe hydronephrosis on the left. There is no appreciable hydronephrosis on the right. No calculi evident in the right kidney. There is a calculus in the lower pole calyx region on the right measuring 8 x 8 mm. A nearby second calculus in this area measures 9 x 7 mm. There is a 1 mm calculus in the mid left kidney. There is a 2 mm calculus more inferiorly in the lower pole left kidney. There is a 3 x 2 mm calculus in the left renal pelvis. There is a calculus at the left ureteropelvic junction measuring 1.0 x 0.9 cm. No other ureteral calculi are evident on either side. Urinary bladder is midline with wall thickness within normal limits. Stomach/Bowel: There is no appreciable bowel wall or mesenteric thickening. There is no  appreciable bowel obstruction. The terminal ileum appears normal. There is no evident free air or portal venous air. Vascular/Lymphatic: There is no abdominal aortic aneurysm. Aorta is tortuous. There is mild calcification in the aorta and common iliac arteries. There is no appreciable adenopathy in the abdomen or pelvis. Reproductive: Uterus is absent. There is no pelvic mass evident. Other: There is no appendiceal region inflammation. There is no abscess or ascites in the abdomen or pelvis. Musculoskeletal: There is extensive degenerative type change throughout lower thoracic and lumbar regions. There is moderately severe spinal stenosis at L4-5 due to diffuse bony overgrowth. No blastic or lytic bone lesions are evident. There is fatty infiltration in the lateral aspect of the left tensor fascia lata muscle. No abdominal wall lesions are evident. IMPRESSION: 1. There is severe hydronephrosis on the left with extensive soft tissue stranding and fluid in the perinephric fascia on the left. There is a calculus at the left ureteropelvic junction measuring 1.0 x 0.9 cm. Multiple calculi throughout the left kidney, primarily in the left lower pole region. 2. Cholelithiasis.  No appreciable gallbladder wall thickening 3. Small hiatal hernia. 4. Aortic atherosclerosis. 5. Severe degenerative type change throughout the lower thoracic and lumbar spine. There is moderately severe spinal stenosis at L4-5 due to diffuse bony overgrowth. 6. No bowel obstruction. No abscess in the abdomen or pelvis. No periappendiceal region inflammation. Aortic Atherosclerosis (ICD10-I70.0). Electronically Signed   By: Lowella Grip III M.D.   On: 02/18/2019 16:11       Subjective: Patient reports he feels at baseline no fevers. Tolerating diet urinating without complication requested discharge home.  Discharge Exam: Vitals:   02/19/19 1945 02/20/19 0411  BP: 126/76 (!) 142/77  Pulse: 80 88  Resp: 16 20  Temp: 97.9 F (36.6  C) 98.8 F (37.1 C)  SpO2: 93% 91%   Vitals:   02/19/19 0539 02/19/19 1349 02/19/19 1945 02/20/19 0411  BP: (!) 120/49 (!) 109/53 126/76 (!) 142/77  Pulse: 89 85 80 88  Resp: 20  16 20   Temp: 99 F (37.2 C) 99.2 F (37.3 C) 97.9 F (36.6 C) 98.8 F (37.1 C)  TempSrc: Oral Oral Oral Oral  SpO2: 93% 91% 93% 91%  Weight:      Height:        General: Pt is alert, awake, not in acute distress Cardiovascular: RRR, S1/S2 +, no rubs, no gallops Respiratory: CTA bilaterally, no wheezing, no rhonchi Abdominal:  Soft, NT, ND, bowel sounds + Extremities: no edema, no cyanosis    The results of significant diagnostics from this hospitalization (including imaging, microbiology, ancillary and laboratory) are listed below for reference.     Microbiology: Recent Results (from the past 240 hour(s))  SARS Coronavirus 2 by RT PCR (hospital order, performed in [facility] hospital lab) Nasopharyngeal Nasopharyngeal Swab     Status: None   Collection Time: 02/18/19  5:26 PM   Specimen: Nasopharyngeal Swab  Result Value Ref Range Status   SARS Coronavirus 2 NEGATIVE NEGATIVE Final    Comment: (NOTE) SARS-CoV-2 target nucleic acids are NOT DETECTED. The SARS-CoV-2 RNA is generally detectable in upper and lower respiratory specimens during the acute phase of infection. The lowest concentration of SARS-CoV-2 viral copies this assay can detect is 250 copies / mL. A negative result does not preclude SARS-CoV-2 infection and should not be used as the sole basis for treatment or other patient management decisions.  A negative result may occur with improper specimen collection / handling, submission of specimen other than nasopharyngeal swab, presence of viral mutation(s) within the areas targeted by this assay, and inadequate number of viral copies (<250 copies / mL). A negative result must be combined with clinical observations, patient history, and epidemiological information. Fact Sheet for  Patients:   StrictlyIdeas.no Fact Sheet for Healthcare Providers: BankingDealers.co.za This test is not yet approved or cleared  by the [place] FDA and has been authorized for detection and/or diagnosis of SARS-CoV-2 by FDA under an Emergency Use Authorization (EUA).  This EUA will remain in effect (meaning this test can be used) for the duration of the COVID-19 declaration under Section 564(b)(1) of the Act, 21 U.S.C. section 360bbb-3(b)(1), unless the authorization is terminated or revoked sooner. Performed at [facility], [address], [place], [place] [address]   Urine Culture     Status: Abnormal   Collection Time: 02/18/19  8:44 PM   Specimen: PATH Other; Urine  Result Value Ref Range Status   Specimen Description   Final    URINE, RANDOM Performed at [facility], [address] [place], [place] [address]    Special Requests   Final    LEFT RENAL PELVIS ON CUP Performed at [facility], [place] [address] [place], [place] [address]    Culture MULTIPLE SPECIES PRESENT, SUGGEST RECOLLECTION (A)  Final   Report Status 02/19/2019 FINAL  Final     Labs: BNP (last 3 results) No results for input(s): BNP in the last 8760 hours. Basic Metabolic Panel: Recent Labs  Lab 02/18/19 1359 02/19/19 0424 02/20/19 0534  NA 142 141 140  K 3.4* 3.6 3.2*  CL 106 107 105  CO2 22 24 25   GLUCOSE 137* 207* 173*  BUN 22 26* 17  CREATININE 0.86 0.99 0.81  CALCIUM 9.4 8.6* 8.7*   Liver Function Tests: Recent Labs  Lab 02/18/19 1359  AST 24  ALT 19  ALKPHOS 60  BILITOT 2.0*  PROT 7.0  ALBUMIN 4.1   Recent Labs  Lab 02/18/19 1359  LIPASE 11   No results for input(s): AMMONIA in the last 168 hours. CBC: Recent Labs  Lab 02/18/19 1359 02/19/19 0424 02/20/19 0534  WBC 8.1 15.9* 11.5*  NEUTROABS  --   --  9.9*  HGB 15.5* 13.5 13.1  HCT 44.5 38.9 37.9  MCV 88.5 88.8 88.8  PLT 161 155 133*    Cardiac Enzymes: No results for input(s): CKTOTAL, CKMB, CKMBINDEX,  TROPONINI in the last 168 hours. BNP: Invalid input(s): POCBNP CBG: Recent Labs  Lab 02/19/19 0744 02/19/19 1135 02/19/19 1729 02/19/19 2131 02/20/19 0750  GLUCAP 177* 154* 173* 131* 154*   D-Dimer No results for input(s): DDIMER in the last 72 hours. Hgb A1c No results for input(s): HGBA1C in the last 72 hours. Lipid Profile No results for input(s): CHOL, HDL, LDLCALC, TRIG, CHOLHDL, LDLDIRECT in the last 72 hours. Thyroid function studies No results for input(s): TSH, T4TOTAL, T3FREE, THYROIDAB in the last 72 hours.  Invalid input(s): FREET3 Anemia work up No results for input(s): VITAMINB12, FOLATE, FERRITIN, TIBC, IRON, RETICCTPCT in the last 72 hours. Urinalysis    Component Value Date/Time   COLORURINE YELLOW (A) 02/18/2019 1332   APPEARANCEUR HAZY (A) 02/18/2019 1332   APPEARANCEUR Hazy 11/08/2012 1719   LABSPEC 1.015 02/18/2019 1332   LABSPEC 1.017 11/08/2012 1719   PHURINE 5.0 02/18/2019 1332   GLUCOSEU NEGATIVE 02/18/2019 1332   GLUCOSEU 50 mg/dL 11/08/2012 1719   HGBUR MODERATE (A) 02/18/2019 1332   BILIRUBINUR NEGATIVE 02/18/2019 1332   BILIRUBINUR Negative 11/08/2012 1719   KETONESUR 5 (A) 02/18/2019 1332   PROTEINUR 30 (A) 02/18/2019 1332   NITRITE POSITIVE (A) 02/18/2019 1332   LEUKOCYTESUR SMALL (A) 02/18/2019 1332   LEUKOCYTESUR 1+ 11/08/2012 1719   Sepsis Labs Invalid input(s): PROCALCITONIN,  WBC,  LACTICIDVEN Microbiology Recent Results (from the past 240 hour(s))  SARS Coronavirus 2 by RT PCR (hospital order, performed in [place] hospital lab) Nasopharyngeal Nasopharyngeal Swab     Status: None   Collection Time: 02/18/19  5:26 PM   Specimen: Nasopharyngeal Swab  Result Value Ref Range Status   SARS Coronavirus 2 NEGATIVE NEGATIVE Final    Comment: (NOTE) SARS-CoV-2 target nucleic acids are NOT DETECTED. The SARS-CoV-2 RNA is generally detectable in upper and  lower respiratory specimens during the acute phase of infection. The lowest concentration of SARS-CoV-2 viral copies this assay can detect is 250 copies / mL. A negative result does not preclude SARS-CoV-2 infection and should not be used as the sole basis for treatment or other patient management decisions.  A negative result may occur with improper specimen collection / handling, submission of specimen other than nasopharyngeal swab, presence of viral mutation(s) within the areas targeted by this assay, and inadequate number of viral copies (<250 copies / mL). A negative result must be combined with clinical observations, patient history, and epidemiological information. Fact Sheet for Patients:   StrictlyIdeas.no Fact Sheet for Healthcare Providers: BankingDealers.co.za This test is not yet approved or cleared  by the [place] FDA and has been authorized for detection and/or diagnosis of SARS-CoV-2 by FDA under an Emergency Use Authorization (EUA).  This EUA will remain in effect (meaning this test can be used) for the duration of the COVID-19 declaration under Section 564(b)(1) of the Act, 21 U.S.C. section 360bbb-3(b)(1), unless the authorization is terminated or revoked sooner. Performed at [facility], [address], [place], [place] [address]   Urine Culture     Status: Abnormal   Collection Time: 02/18/19  8:44 PM   Specimen: PATH Other; Urine  Result Value Ref Range Status   Specimen Description   Final    URINE, RANDOM Performed at [facility], [address] [place], [place] [address]    Special Requests   Final    LEFT RENAL PELVIS ON CUP Performed at [facility], [place] [address] [place], [place] [address]    Culture  MULTIPLE SPECIES PRESENT, SUGGEST RECOLLECTION (A)  Final   Report Status 02/19/2019 FINAL  Final     Time coordinating discharge: Over 30  minutes  SIGNED:   Nicolette Bang, MD  [facility] 02/20/2019, 9:25 AM Pager   If 7PM-7AM, please contact night-coverage [email or web address] Password [email or web address]

## 2019-02-20 NOTE — Progress Notes (Signed)
Gabriella Howe  A and O x 4. VSS. Pt tolerating diet well. No complaints of pain or nausea. IV removed intact, prescriptions given. Pt voiced understanding of discharge instructions with no further questions. Pt discharged via wheelchair with NT.    Allergies as of 02/20/2019      Reactions   Rofecoxib    REACTION: reaction not known   Tetanus-diphtheria Toxoids Td    REACTION: sick with fever   Antihistamines, Chlorpheniramine-type Palpitations   Sulfa Antibiotics Rash      Medication List    TAKE these medications   B-12 PO Take 1 tablet by mouth daily.   BC FAST PAIN RELIEF PO Take by mouth as directed.   benazepril 20 MG tablet Commonly known as: LOTENSIN Take 1 tablet (20 mg total) by mouth daily.   CALCIUM-VITAMIN D PO Take 1 tablet by mouth 2 (two) times daily.   cefdinir 300 MG capsule Commonly known as: OMNICEF Take 1 capsule (300 mg total) by mouth 2 (two) times daily for 7 days.   FISH OIL PO Take 1 capsule by mouth daily.   furosemide 40 MG tablet Commonly known as: LASIX Take 0.5 tablets (20 mg total) by mouth daily.   glipiZIDE 5 MG 24 hr tablet Commonly known as: GLUCOTROL XL Take 1 tablet (5 mg total) by mouth daily with breakfast.   glucose blood test strip Commonly known as: Estate manager/land agent Use to check blood sugar once daily as directed for DM (Dx. E11.9)   ibuprofen 200 MG tablet Commonly known as: ADVIL Take 200 mg by mouth every 6 (six) hours as needed.   Magnesium 250 MG Tabs Take 250 mg by mouth daily.   metFORMIN 1000 MG tablet Commonly known as: GLUCOPHAGE Take 1 tablet (1,000 mg total) by mouth 2 (two) times daily with a meal.   Microlet Lancets Misc Use to check blood sugar once daily as directed for DM (Dx. E11.9)   multivitamin capsule Take 1 capsule by mouth daily.   omeprazole 20 MG capsule Commonly known as: PRILOSEC Take 1 capsule (20 mg total) by mouth daily.   simvastatin 40 MG tablet Commonly known as:  ZOCOR Take 1 tablet (40 mg total) by mouth at bedtime.   SINUS & ALLERGY 12 HOUR PO Take by mouth as directed.   triamcinolone cream 0.1 % Commonly known as: KENALOG APPLY TO AFFECTED AREA(S)  TOPICALLY TWO TIMES A DAY   vitamin C 500 MG tablet Commonly known as: ASCORBIC ACID Take 500 mg by mouth daily.   vitamin E 400 UNIT capsule Take 400 Units by mouth daily.       Vitals:   02/20/19 0411 02/20/19 1209  BP: (!) 142/77 (!) 148/75  Pulse: 88 87  Resp: 20 20  Temp: 98.8 F (37.1 C) 99.4 F (37.4 C)  SpO2: 91% 92%    Francesco Sor

## 2019-02-25 NOTE — Telephone Encounter (Signed)
App made for 02-26-19 Patient is aware   Sharyn Lull

## 2019-02-26 ENCOUNTER — Encounter: Payer: Self-pay | Admitting: Urology

## 2019-02-26 ENCOUNTER — Ambulatory Visit: Payer: Medicare Other | Admitting: Urology

## 2019-02-26 ENCOUNTER — Other Ambulatory Visit: Payer: Self-pay | Admitting: Urology

## 2019-02-26 ENCOUNTER — Other Ambulatory Visit: Payer: Self-pay

## 2019-02-26 VITALS — BP 148/78 | HR 86 | Ht 62.5 in | Wt 205.0 lb

## 2019-02-26 DIAGNOSIS — N200 Calculus of kidney: Secondary | ICD-10-CM

## 2019-02-26 DIAGNOSIS — M1990 Unspecified osteoarthritis, unspecified site: Secondary | ICD-10-CM | POA: Insufficient documentation

## 2019-02-26 DIAGNOSIS — Z01818 Encounter for other preprocedural examination: Secondary | ICD-10-CM | POA: Diagnosis not present

## 2019-02-26 DIAGNOSIS — E785 Hyperlipidemia, unspecified: Secondary | ICD-10-CM | POA: Insufficient documentation

## 2019-02-26 NOTE — Progress Notes (Signed)
02/26/2019 1:19 PM   Gabriella Howe 10-Nov-1934 [id number]  Referring provider: Abner Greenspan, MD [address] [place],  [place] [address]  Chief Complaint  Patient presents with  . Follow-up    HPI: 84 y.o. female presents for hospital follow-up and scheduling a definitive stone treatment.  She presented to the ED on 02/18/2019 with left flank pain radiating to the left groin.  She had chills but no fever.  CT remarkable for a 10 mm left UPJ calculus with severe hydronephrosis.  Urinalysis showed pyuria and was nitrite positive.  She had stable vital signs and no leukocytosis.  She underwent left ureteral stent placement. Urine culture from the left renal pelvis grew multiple species.  She had no postoperative problems.  She is tolerating her stent well.  PMH: Past Medical History:  Diagnosis Date  . Adenomatous colon polyp 2003  . Benign neoplasm of colon   . Carpal tunnel syndrome   . Chest pain, unspecified   . CHF (congestive heart failure) (HCC)    diastolic  . Degeneration of intervertebral disc, site unspecified   . Diabetes mellitus    type II  . Encounter for long-term (current) use of other medications   . Fluid overload   . GERD (gastroesophageal reflux disease)   . Heart disease, unspecified   . Hyperlipidemia   . Hypertension   . Kidney stone 3/12   hosp/ with stent  . Obesity, unspecified   . Rosacea   . Shortness of breath   . Unspecified asthma(493.90)   . Unspecified sleep apnea     Surgical History: Past Surgical History:  Procedure Laterality Date  . ABDOMINAL HYSTERECTOMY     partial, fibroids  . CYSTOSCOPY WITH STENT PLACEMENT Left 02/18/2019   Procedure: CYSTOSCOPY WITH STENT PLACEMENT;  Surgeon: Abbie Sons, MD;  Location: [facility] ORS;  Service: Urology;  Laterality: Left;  . ESOPHAGOGASTRODUODENOSCOPY  2000  . UMBILICAL HERNIA REPAIR  01/2004  . URETERAL STENT PLACEMENT  2/13   for L sided stone/ ecoli sepsis    Home  Medications:  Allergies as of 02/26/2019      Reactions   Rofecoxib    REACTION: reaction not known   Tetanus-diphtheria Toxoids Td    REACTION: sick with fever   Antihistamines, Chlorpheniramine-type Palpitations   Sulfa Antibiotics Rash      Medication List       Accurate as of February 26, 2019  1:19 PM. If you have any questions, ask your nurse or doctor.        B-12 PO Take 1 tablet by mouth daily.   BC FAST PAIN RELIEF PO Take by mouth as directed.   benazepril 20 MG tablet Commonly known as: LOTENSIN Take 1 tablet (20 mg total) by mouth daily.   CALCIUM-VITAMIN D PO Take 1 tablet by mouth 2 (two) times daily.   cefdinir 300 MG capsule Commonly known as: OMNICEF Take 1 capsule (300 mg total) by mouth 2 (two) times daily for 7 days.   FISH OIL PO Take 1 capsule by mouth daily.   furosemide 40 MG tablet Commonly known as: LASIX Take 0.5 tablets (20 mg total) by mouth daily.   glipiZIDE 5 MG 24 hr tablet Commonly known as: GLUCOTROL XL Take 1 tablet (5 mg total) by mouth daily with breakfast.   glucose blood test strip Commonly known as: Estate manager/land agent Use to check blood sugar once daily as directed for DM (Dx. E11.9)  ibuprofen 200 MG tablet Commonly known as: ADVIL Take 200 mg by mouth every 6 (six) hours as needed.   Magnesium 250 MG Tabs Take 250 mg by mouth daily.   metFORMIN 1000 MG tablet Commonly known as: GLUCOPHAGE Take 1 tablet (1,000 mg total) by mouth 2 (two) times daily with a meal.   Microlet Lancets Misc Use to check blood sugar once daily as directed for DM (Dx. E11.9)   multivitamin capsule Take 1 capsule by mouth daily.   omeprazole 20 MG capsule Commonly known as: PRILOSEC Take 1 capsule (20 mg total) by mouth daily.   simvastatin 40 MG tablet Commonly known as: ZOCOR Take 1 tablet (40 mg total) by mouth at bedtime.   SINUS & ALLERGY 12 HOUR PO Take by mouth as directed.   triamcinolone cream 0.1 % Commonly known  as: KENALOG APPLY TO AFFECTED AREA(S)  TOPICALLY TWO TIMES A DAY   vitamin C 500 MG tablet Commonly known as: ASCORBIC ACID Take 500 mg by mouth daily.   vitamin E 400 UNIT capsule Take 400 Units by mouth daily.       Allergies:  Allergies  Allergen Reactions  . Rofecoxib     REACTION: reaction not known  . Tetanus-Diphtheria Toxoids Td     REACTION: sick with fever  . Antihistamines, Chlorpheniramine-Type Palpitations  . Sulfa Antibiotics Rash    Family History: Family History  Problem Relation Age of Onset  . Early death Father 16       train accident  . Diabetes Mother   . Heart disease Mother   . Diabetes Sister   . Diabetes Brother   . Colon cancer Brother   . Cancer Paternal Grandmother        unknown  . Diabetes Sister     Social History:  reports that she has never smoked. She has never used smokeless tobacco. She reports that she does not drink alcohol or use drugs.  ROS: UROLOGY Frequent Urination?: Yes Hard to postpone urination?: Yes Burning/pain with urination?: No Get up at night to urinate?: No Leakage of urine?: No Urine stream starts and stops?: No Trouble starting stream?: No Do you have to strain to urinate?: No Blood in urine?: No Urinary tract infection?: No Sexually transmitted disease?: No Injury to kidneys or bladder?: Yes Painful intercourse?: No Weak stream?: No Currently pregnant?: No Vaginal bleeding?: No Last menstrual period?: N/A  Gastrointestinal Nausea?: No Vomiting?: No Indigestion/heartburn?: No Diarrhea?: No Constipation?: No  Constitutional Fever: No Night sweats?: No Weight loss?: No Fatigue?: No  Skin Skin rash/lesions?: No Itching?: No  Eyes Blurred vision?: No Double vision?: No  Ears/Nose/Throat Sore throat?: No Sinus problems?: Yes  Hematologic/Lymphatic Swollen glands?: No Easy bruising?: No  Cardiovascular Leg swelling?: No Chest pain?: No  Respiratory Cough?: No Shortness of  breath?: No  Endocrine Excessive thirst?: No  Musculoskeletal Back pain?: Yes Joint pain?: No  Neurological Headaches?: No Dizziness?: No  Psychologic Depression?: No Anxiety?: No  Physical Exam: BP (!) 148/78 (BP Location: Left Arm, Patient Position: Sitting, Cuff Size: Large)   Pulse 86   Ht 5' 2.5" (1.588 m)   Wt 205 lb (93 kg)   BMI 36.90 kg/m   Constitutional:  Alert and oriented, No acute distress. HEENT: [place] AT, moist mucus membranes.  Trachea midline, no masses. Cardiovascular: No clubbing, cyanosis, or edema.  RRR. Respiratory: Normal respiratory effort, no increased work of breathing.  Clear GI: Abdomen is soft, nontender, nondistended, no abdominal masses GU: No  CVA tenderness Lymph: No cervical or inguinal lymphadenopathy. Skin: No rashes, bruises or suspicious lesions. Neurologic: Grossly intact, no focal deficits, moving all 4 extremities. Psychiatric: Normal mood and affect.  Pertinent Imaging:  Results for orders placed during the hospital encounter of 02/18/19  CT Renal Stone Study   Narrative CLINICAL DATA:  Left flank pain  EXAM: CT ABDOMEN AND PELVIS WITHOUT CONTRAST  TECHNIQUE: Multidetector CT imaging of the abdomen and pelvis was performed following the standard protocol without oral or IV contrast.  COMPARISON:  December 19, 2012  FINDINGS: Lower chest: There is mild scarring in the lung bases. There is no lung base edema or consolidation. There is a small hiatal hernia.  Hepatobiliary: No focal liver lesions are evident on this noncontrast enhanced study. There is cholelithiasis. There is no appreciable gallbladder wall thickening. There is no evident biliary duct dilatation.  Pancreas: There is no pancreatic mass or inflammatory focus.  Spleen: No splenic lesions are evident. A small splenule is noted anterior to the spleen.  Adrenals/Urinary Tract: Adrenals bilaterally appear unremarkable. Left kidney is edematous. There is  extensive soft tissue stranding and fluid in the perinephric fascia on the left. There is a 5 mm cyst arising from the lateral mid to lower right kidney. There is severe hydronephrosis on the left. There is no appreciable hydronephrosis on the right. No calculi evident in the right kidney. There is a calculus in the lower pole calyx region on the right measuring 8 x 8 mm. A nearby second calculus in this area measures 9 x 7 mm. There is a 1 mm calculus in the mid left kidney. There is a 2 mm calculus more inferiorly in the lower pole left kidney. There is a 3 x 2 mm calculus in the left renal pelvis. There is a calculus at the left ureteropelvic junction measuring 1.0 x 0.9 cm. No other ureteral calculi are evident on either side. Urinary bladder is midline with wall thickness within normal limits.  Stomach/Bowel: There is no appreciable bowel wall or mesenteric thickening. There is no appreciable bowel obstruction. The terminal ileum appears normal. There is no evident free air or portal venous air.  Vascular/Lymphatic: There is no abdominal aortic aneurysm. Aorta is tortuous. There is mild calcification in the aorta and common iliac arteries. There is no appreciable adenopathy in the abdomen or pelvis.  Reproductive: Uterus is absent. There is no pelvic mass evident.  Other: There is no appendiceal region inflammation. There is no abscess or ascites in the abdomen or pelvis.  Musculoskeletal: There is extensive degenerative type change throughout lower thoracic and lumbar regions. There is moderately severe spinal stenosis at L4-5 due to diffuse bony overgrowth. No blastic or lytic bone lesions are evident. There is fatty infiltration in the lateral aspect of the left tensor fascia lata muscle. No abdominal wall lesions are evident.  IMPRESSION: 1. There is severe hydronephrosis on the left with extensive soft tissue stranding and fluid in the perinephric fascia on the left.  There is a calculus at the left ureteropelvic junction measuring 1.0 x 0.9 cm. Multiple calculi throughout the left kidney, primarily in the left lower pole region. 2. Cholelithiasis.  No appreciable gallbladder wall thickening 3. Small hiatal hernia. 4. Aortic atherosclerosis. 5. Severe degenerative type change throughout the lower thoracic and lumbar spine. There is moderately severe spinal stenosis at L4-5 due to diffuse bony overgrowth. 6. No bowel obstruction. No abscess in the abdomen or pelvis. No periappendiceal region inflammation.  Aortic Atherosclerosis (  ICD10-I70.0).   Electronically Signed   By: Lowella Grip III M.D.   On: 02/18/2019 16:11     Assessment & Plan:    - Left nephrolithiasis 10 mm left UPJ calculus and she also has several nonobstructing left renal calculi.  I recommended scheduling ureteroscopy with a goal of clearing her left renal calculi.  The procedure was discussed in detail.  The alternative of shockwave lithotripsy was discussed however would take more than one treatment and she had a prior lithotripsy of a similar stone in 2014 and does not think that the lithotripsy was effective.  The indications and nature of the planned procedure were discussed as well as the potential  benefits and expected outcome.  Alternatives have been discussed in detail. The most common complications and side effects were discussed including but not limited to infection/sepsis; blood loss; damage to urethra, bladder, ureter, kidney; need for multiple surgeries; need for prolonged stent placement as well as general anesthesia risks. Although uncommon she was also informed of the possibility that the calculus may not be able to be treated due to inability to obtain access to the upper ureter and could require alternative treatments.. All of her questions were answered and she desires to proceed.     Abbie Sons, [place] [address], [place], [place] [address] [phone]

## 2019-02-28 ENCOUNTER — Other Ambulatory Visit
Admission: RE | Admit: 2019-02-28 | Discharge: 2019-02-28 | Disposition: A | Discharge status: Home / Self Care | Payer: Medicare Other | Source: Ambulatory Visit | Attending: Urology | Admitting: Urology

## 2019-02-28 ENCOUNTER — Other Ambulatory Visit: Payer: Self-pay

## 2019-02-28 DIAGNOSIS — Z01812 Encounter for preprocedural laboratory examination: Secondary | ICD-10-CM | POA: Diagnosis not present

## 2019-02-28 DIAGNOSIS — Z20828 Contact with and (suspected) exposure to other viral communicable diseases: Secondary | ICD-10-CM | POA: Insufficient documentation

## 2019-03-01 LAB — CULTURE, URINE COMPREHENSIVE

## 2019-03-01 LAB — SARS CORONAVIRUS 2 (TAT 6-24 HRS): SARS Coronavirus 2: NEGATIVE

## 2019-03-03 ENCOUNTER — Telehealth: Payer: Self-pay

## 2019-03-03 NOTE — Telephone Encounter (Signed)
Called [facility] requested that Test number [phone] be added on. They will pull specimen and see if test is able to be added.

## 2019-03-03 NOTE — Telephone Encounter (Signed)
-----   Message from Abbie Sons, MD sent at 03/03/2019 11:29 AM EST ----- Please contact lab and request further identification of the yeast.

## 2019-03-05 LAB — ORGANISM IDENTIFICATION, YEAST

## 2019-03-05 LAB — SPECIMEN STATUS REPORT

## 2019-03-06 ENCOUNTER — Telehealth: Payer: Self-pay | Admitting: Urology

## 2019-03-06 DIAGNOSIS — B379 Candidiasis, unspecified: Secondary | ICD-10-CM

## 2019-03-06 MED ORDER — FLUCONAZOLE 100 MG PO TABS: ORAL_TABLET | ORAL | 0 refills | Status: DC

## 2019-03-06 NOTE — Telephone Encounter (Signed)
Called pt informed her that rx has been sent in, advised pt of the need to d/c simvastatin for the duration of the antibiotic. Pt voiced understanding.

## 2019-03-06 NOTE — Telephone Encounter (Signed)
Urine culture was growing Candida.  I sent Rx fluconazole to her pharmacy and would have her start tomorrow.

## 2019-03-06 NOTE — Telephone Encounter (Signed)
Have her hold the simvastatin while taking this medication.  I sent the Rx

## 2019-03-06 NOTE — Telephone Encounter (Signed)
Called pharmacy they keep the med in stock

## 2019-03-06 NOTE — Telephone Encounter (Signed)
Pt called back asking about ABX,  I read her the previous note and she asked for a call back. Please advise.

## 2019-03-07 ENCOUNTER — Other Ambulatory Visit: Admission: RE | Admit: 2019-03-07 | Payer: Medicare Other | Source: Ambulatory Visit

## 2019-03-10 ENCOUNTER — Inpatient Hospital Stay: Admission: RE | Admit: 2019-03-10 | Payer: Medicare Other | Source: Ambulatory Visit

## 2019-03-11 ENCOUNTER — Telehealth: Payer: Self-pay | Admitting: Urology

## 2019-03-11 NOTE — Telephone Encounter (Signed)
Pt.'s surgery has been rescheduled to 04/01/19 and I called to give her new date. Pt. States she is having some small amounts of Hematuria when she goes to the bathroom and would like for clinical to call her to discuss this due to surgery being rescheduled three weeks from today

## 2019-03-11 NOTE — Telephone Encounter (Signed)
She has a stent which would be the source of her intermittent hematuria.

## 2019-03-11 NOTE — Telephone Encounter (Signed)
Please advise 

## 2019-03-12 NOTE — Telephone Encounter (Signed)
Called pt informed her that intermittent hematuria was normal for stent, and that there is nothing to be done other than monitoring. Advised pt to call back of sx worsen. Pt gave verbal understanding.

## 2019-03-24 ENCOUNTER — Other Ambulatory Visit: Payer: Self-pay

## 2019-03-24 DIAGNOSIS — Z01818 Encounter for other preprocedural examination: Secondary | ICD-10-CM

## 2019-03-25 ENCOUNTER — Other Ambulatory Visit: Payer: Self-pay

## 2019-03-25 ENCOUNTER — Other Ambulatory Visit: Payer: Medicare Other

## 2019-03-25 DIAGNOSIS — Z01818 Encounter for other preprocedural examination: Secondary | ICD-10-CM | POA: Diagnosis not present

## 2019-03-25 DIAGNOSIS — Z87440 Personal history of urinary (tract) infections: Secondary | ICD-10-CM

## 2019-03-25 LAB — URINALYSIS, COMPLETE
Bilirubin, UA: NEGATIVE
Glucose, UA: NEGATIVE
Nitrite, UA: POSITIVE — AB
Specific Gravity, UA: >1.03 — ABNORMAL HIGH (ref 1.005–1.030)
Urobilinogen, Ur: 0.2 mg/dL (ref 0.2–1.0)
pH, UA: 5.5 (ref 5.0–7.5)

## 2019-03-25 LAB — MICROSCOPIC EXAMINATION
RBC, Urine: >30 /hpf — AB (ref 0–2)
WBC, UA: >30 /hpf — AB (ref 0–5)

## 2019-03-26 ENCOUNTER — Encounter
Admission: RE | Admit: 2019-03-26 | Discharge: 2019-03-26 | Disposition: A | Discharge status: Home / Self Care | Payer: Medicare Other | Source: Ambulatory Visit | Attending: Urology | Admitting: Urology

## 2019-03-26 ENCOUNTER — Other Ambulatory Visit: Payer: Self-pay

## 2019-03-26 NOTE — Patient Instructions (Signed)
Your procedure is scheduled on: Tuesday, April 01, 2019 Report to Day Surgery on the 2nd floor of the [facility]. To find out your arrival time, please call [phone] between 1PM - 3PM on: Monday, March 31, 2019  REMEMBER: Instructions that are not followed completely may result in serious medical risk, up to and including death; or upon the discretion of your surgeon and anesthesiologist your surgery may need to be rescheduled.  Do not eat food after midnight the night before surgery.  No gum chewing, lozengers or hard candies.  You may however, drink water up to 2 hours before you are scheduled to arrive for your surgery. Do not drink anything within 2 hours of the start of your surgery.  No Alcohol for 24 hours before or after surgery.  No Smoking including e-cigarettes for 24 hours prior to surgery.   On the morning of surgery brush your teeth with toothpaste and water, you may rinse your mouth with mouthwash if you wish. Do not swallow any toothpaste or mouthwash.  Notify your doctor if there is any change in your medical condition (cold, fever, infection).  Do not wear jewelry, make-up, hairpins, clips or nail polish.  Do not wear lotions, powders, or perfumes.   Do not shave 48 hours prior to surgery.   Contacts and dentures may not be worn into surgery.  Do not bring valuables to the hospital, including drivers license, insurance or credit cards.  [facility] is not responsible for any belongings or valuables.   TAKE THESE MEDICATIONS THE MORNING OF SURGERY:  1.  Omeprazole - (take one the night before and one on the morning of surgery - helps to prevent nausea after surgery.)  Stop Metformin 2 days prior to surgery. Last day to take is Saturday, January 2; resume after surgery.  Now! Stop aspirin, Anti-inflammatories (NSAIDS) such as Advil, Aleve, Ibuprofen, Motrin, Naproxen, Naprosyn and Aspirin based products such as Excedrin, Goodys Powder, BC Powder. (May  take Tylenol or Acetaminophen if needed.)  Now!  Stop ANY OVER THE COUNTER supplements until after surgery. (vitamin C, Calcium, magnesium, fish oil, vitamin E) (May continue Vitamin D, Vitamin B, and multivitamin.)  Wear comfortable clothing (specific to your surgery type) to the hospital.  If you are being discharged the day of surgery, you will not be allowed to drive home. You will need a responsible adult to drive you home and stay with you that night.   If you are taking public transportation, you will need to have a responsible adult with you. Please confirm with your physician that it is acceptable to use public transportation.   Please call [phone] if you have any questions about these instructions.

## 2019-03-27 ENCOUNTER — Other Ambulatory Visit
Admission: RE | Admit: 2019-03-27 | Discharge: 2019-03-27 | Disposition: A | Discharge status: Home / Self Care | Payer: Medicare Other | Source: Ambulatory Visit | Attending: Urology | Admitting: Urology

## 2019-03-27 ENCOUNTER — Other Ambulatory Visit: Payer: Self-pay | Admitting: Urology

## 2019-03-27 ENCOUNTER — Telehealth: Payer: Self-pay

## 2019-03-27 DIAGNOSIS — Z01812 Encounter for preprocedural laboratory examination: Secondary | ICD-10-CM | POA: Diagnosis not present

## 2019-03-27 DIAGNOSIS — Z20828 Contact with and (suspected) exposure to other viral communicable diseases: Secondary | ICD-10-CM | POA: Diagnosis not present

## 2019-03-27 LAB — CULTURE, URINE COMPREHENSIVE

## 2019-03-27 LAB — POTASSIUM: Potassium: 4.1 mmol/L (ref 3.5–5.1)

## 2019-03-27 MED ORDER — CEFUROXIME AXETIL 250 MG PO TABS: 250.0000 mg | ORAL_TABLET | Freq: Two times a day (BID) | ORAL | 0 refills | Status: AC

## 2019-03-27 NOTE — Telephone Encounter (Signed)
Patient notified

## 2019-03-27 NOTE — Telephone Encounter (Signed)
-----   Message from Abbie Sons, MD sent at 03/27/2019  1:31 PM EST ----- Preop urine culture is growing gram-negative rods.  I went ahead and send in an antibiotic to her pharmacy.  I will contact her this week and if the organism is not sensitive to the prescribed antibiotic.  Otherwise can proceed with surgery as scheduled.

## 2019-03-28 LAB — SARS CORONAVIRUS 2 (TAT 6-24 HRS): SARS Coronavirus 2: NEGATIVE

## 2019-03-31 ENCOUNTER — Telehealth: Payer: Self-pay | Admitting: Radiology

## 2019-03-31 MED ORDER — SODIUM CHLORIDE 0.9 % IV SOLN
1.0000 g | INTRAVENOUS | Status: AC
  Administered 2019-04-01: 1 g via INTRAVENOUS
  Filled 2019-03-31: qty 1

## 2019-03-31 NOTE — Telephone Encounter (Signed)
LMOM to return call to confirm patient is taking antibiotics as prescribed.

## 2019-03-31 NOTE — Telephone Encounter (Signed)
-----   Message from Abbie Sons, MD sent at 03/31/2019  9:23 AM EST ----- Urine culture was sensitive to prescribed antibiotic.  Please verify she started this medication.

## 2019-04-01 ENCOUNTER — Other Ambulatory Visit: Payer: Self-pay

## 2019-04-01 ENCOUNTER — Ambulatory Visit
Admission: RE | Admit: 2019-04-01 | Discharge: 2019-04-01 | Disposition: A | Discharge status: Home / Self Care | Payer: Medicare Other | Attending: Urology | Admitting: Urology

## 2019-04-01 ENCOUNTER — Encounter: Payer: Self-pay | Admitting: Urology

## 2019-04-01 ENCOUNTER — Ambulatory Visit: Payer: Medicare Other | Admitting: Certified Registered Nurse Anesthetist

## 2019-04-01 ENCOUNTER — Ambulatory Visit: Payer: Medicare Other

## 2019-04-01 ENCOUNTER — Encounter
Admission: RE | Disposition: A | Discharge status: Home / Self Care | Payer: Self-pay | Source: Home / Self Care | Attending: Urology

## 2019-04-01 DIAGNOSIS — K219 Gastro-esophageal reflux disease without esophagitis: Secondary | ICD-10-CM | POA: Insufficient documentation

## 2019-04-01 DIAGNOSIS — E669 Obesity, unspecified: Secondary | ICD-10-CM | POA: Insufficient documentation

## 2019-04-01 DIAGNOSIS — Z7984 Long term (current) use of oral hypoglycemic drugs: Secondary | ICD-10-CM | POA: Diagnosis not present

## 2019-04-01 DIAGNOSIS — Z833 Family history of diabetes mellitus: Secondary | ICD-10-CM | POA: Diagnosis not present

## 2019-04-01 DIAGNOSIS — E119 Type 2 diabetes mellitus without complications: Secondary | ICD-10-CM | POA: Diagnosis not present

## 2019-04-01 DIAGNOSIS — I509 Heart failure, unspecified: Secondary | ICD-10-CM | POA: Insufficient documentation

## 2019-04-01 DIAGNOSIS — Z8249 Family history of ischemic heart disease and other diseases of the circulatory system: Secondary | ICD-10-CM | POA: Diagnosis not present

## 2019-04-01 DIAGNOSIS — E785 Hyperlipidemia, unspecified: Secondary | ICD-10-CM | POA: Diagnosis not present

## 2019-04-01 DIAGNOSIS — I110 Hypertensive heart disease with heart failure: Secondary | ICD-10-CM | POA: Insufficient documentation

## 2019-04-01 DIAGNOSIS — N200 Calculus of kidney: Secondary | ICD-10-CM | POA: Diagnosis not present

## 2019-04-01 DIAGNOSIS — I5032 Chronic diastolic (congestive) heart failure: Secondary | ICD-10-CM | POA: Diagnosis not present

## 2019-04-01 DIAGNOSIS — Z79899 Other long term (current) drug therapy: Secondary | ICD-10-CM | POA: Diagnosis not present

## 2019-04-01 HISTORY — PX: CYSTOSCOPY/URETEROSCOPY/HOLMIUM LASER/STENT PLACEMENT: SHX6546

## 2019-04-01 LAB — GLUCOSE, CAPILLARY
Glucose-Capillary: 171 mg/dL — ABNORMAL HIGH (ref 70–99)
Glucose-Capillary: 165 mg/dL — ABNORMAL HIGH (ref 70–99)

## 2019-04-01 SURGERY — CYSTOSCOPY/URETEROSCOPY/HOLMIUM LASER/STENT PLACEMENT
Anesthesia: General | Laterality: Left

## 2019-04-01 MED ORDER — PROPOFOL 10 MG/ML IV BOLUS
INTRAVENOUS | Status: AC
  Filled 2019-04-01: qty 20

## 2019-04-01 MED ORDER — URIBEL 118 MG PO CAPS: 1.0000 | ORAL_CAPSULE | Freq: Three times a day (TID) | ORAL | 0 refills | Status: DC | PRN

## 2019-04-01 MED ORDER — DEXAMETHASONE SODIUM PHOSPHATE 10 MG/ML IJ SOLN
INTRAMUSCULAR | Status: DC | PRN
  Administered 2019-04-01: 10 mg via INTRAVENOUS

## 2019-04-01 MED ORDER — ROCURONIUM BROMIDE 50 MG/5ML IV SOLN
INTRAVENOUS | Status: AC
  Filled 2019-04-01: qty 1

## 2019-04-01 MED ORDER — FENTANYL CITRATE (PF) 100 MCG/2ML IJ SOLN
INTRAMUSCULAR | Status: AC
  Administered 2019-04-01: 25 ug via INTRAVENOUS
  Filled 2019-04-01: qty 2

## 2019-04-01 MED ORDER — SODIUM CHLORIDE 0.9 % IV SOLN: INTRAVENOUS | Status: DC

## 2019-04-01 MED ORDER — LIDOCAINE HCL (PF) 2 % IJ SOLN
INTRAMUSCULAR | Status: AC
  Filled 2019-04-01: qty 5

## 2019-04-01 MED ORDER — OXYCODONE HCL 5 MG PO TABS: 5.0000 mg | ORAL_TABLET | Freq: Once | ORAL | Status: DC | PRN

## 2019-04-01 MED ORDER — PROMETHAZINE HCL 25 MG/ML IJ SOLN: 6.2500 mg | INTRAMUSCULAR | Status: DC | PRN

## 2019-04-01 MED ORDER — LACTATED RINGERS IV SOLN: INTRAVENOUS | Status: DC | PRN

## 2019-04-01 MED ORDER — SUGAMMADEX SODIUM 200 MG/2ML IV SOLN
INTRAVENOUS | Status: AC
  Filled 2019-04-01: qty 2

## 2019-04-01 MED ORDER — FENTANYL CITRATE (PF) 100 MCG/2ML IJ SOLN
INTRAMUSCULAR | Status: AC
  Filled 2019-04-01: qty 2

## 2019-04-01 MED ORDER — FENTANYL CITRATE (PF) 100 MCG/2ML IJ SOLN
25.0000 ug | INTRAMUSCULAR | Status: DC | PRN
  Administered 2019-04-01 (×2): 25 ug via INTRAVENOUS

## 2019-04-01 MED ORDER — OXYCODONE HCL 5 MG/5ML PO SOLN: 5.0000 mg | Freq: Once | ORAL | Status: DC | PRN

## 2019-04-01 MED ORDER — PROPOFOL 10 MG/ML IV BOLUS
INTRAVENOUS | Status: DC | PRN
  Administered 2019-04-01: 110 mg via INTRAVENOUS

## 2019-04-01 MED ORDER — MEPERIDINE HCL 50 MG/ML IJ SOLN: 6.2500 mg | INTRAMUSCULAR | Status: DC | PRN

## 2019-04-01 MED ORDER — ONDANSETRON HCL 4 MG/2ML IJ SOLN
INTRAMUSCULAR | Status: AC
  Filled 2019-04-01: qty 2

## 2019-04-01 MED ORDER — ONDANSETRON HCL 4 MG/2ML IJ SOLN
INTRAMUSCULAR | Status: DC | PRN
  Administered 2019-04-01: 4 mg via INTRAVENOUS

## 2019-04-01 MED ORDER — PHENYLEPHRINE HCL (PRESSORS) 10 MG/ML IV SOLN
INTRAVENOUS | Status: DC | PRN
  Administered 2019-04-01: 100 ug via INTRAVENOUS

## 2019-04-01 MED ORDER — ACETAMINOPHEN 10 MG/ML IV SOLN
INTRAVENOUS | Status: AC
  Filled 2019-04-01: qty 100

## 2019-04-01 MED ORDER — LIDOCAINE HCL (CARDIAC) PF 100 MG/5ML IV SOSY
PREFILLED_SYRINGE | INTRAVENOUS | Status: DC | PRN
  Administered 2019-04-01: 100 mg via INTRAVENOUS

## 2019-04-01 MED ORDER — ROCURONIUM BROMIDE 100 MG/10ML IV SOLN
INTRAVENOUS | Status: DC | PRN
  Administered 2019-04-01: 10 mg via INTRAVENOUS
  Administered 2019-04-01: 40 mg via INTRAVENOUS
  Administered 2019-04-01: 10 mg via INTRAVENOUS

## 2019-04-01 MED ORDER — ACETAMINOPHEN 10 MG/ML IV SOLN
INTRAVENOUS | Status: DC | PRN
  Administered 2019-04-01: 1000 mg via INTRAVENOUS

## 2019-04-01 MED ORDER — SUGAMMADEX SODIUM 200 MG/2ML IV SOLN
INTRAVENOUS | Status: DC | PRN
  Administered 2019-04-01: 200 mg via INTRAVENOUS

## 2019-04-01 MED ORDER — FENTANYL CITRATE (PF) 100 MCG/2ML IJ SOLN
INTRAMUSCULAR | Status: DC | PRN
  Administered 2019-04-01 (×2): 50 ug via INTRAVENOUS

## 2019-04-01 SURGICAL SUPPLY — 31 items
BAG DRAIN CYSTO-URO LG1000N (MISCELLANEOUS) ×3 IMPLANT
BASKET ZERO TIP 1.9FR (BASKET) ×2 IMPLANT
BRUSH SCRUB EZ 1% IODOPHOR (MISCELLANEOUS) ×3 IMPLANT
BSKT STON RTRVL ZERO TP 1.9FR (BASKET) ×1
CATH URETL 5X70 OPEN END (CATHETERS) IMPLANT
CNTNR SPEC 2.5X3XGRAD LEK (MISCELLANEOUS) ×1
CONT SPEC 4OZ STER OR WHT (MISCELLANEOUS) ×2
CONT SPEC 4OZ STRL OR WHT (MISCELLANEOUS) ×1
CONTAINER SPEC 2.5X3XGRAD LEK (MISCELLANEOUS) IMPLANT
DRAPE UTILITY 15X26 TOWEL STRL (DRAPES) ×3 IMPLANT
FIBER LASER TRAC TIP (UROLOGICAL SUPPLIES) ×2 IMPLANT
GLOVE BIO SURGEON STRL SZ8 (GLOVE) ×3 IMPLANT
GOWN STRL REUS W/ TWL LRG LVL3 (GOWN DISPOSABLE) ×1 IMPLANT
GOWN STRL REUS W/ TWL XL LVL3 (GOWN DISPOSABLE) ×1 IMPLANT
GOWN STRL REUS W/TWL LRG LVL3 (GOWN DISPOSABLE) ×3
GOWN STRL REUS W/TWL XL LVL3 (GOWN DISPOSABLE) ×3
GUIDEWIRE STR DUAL SENSOR (WIRE) ×3 IMPLANT
INFUSOR MANOMETER BAG 3000ML (MISCELLANEOUS) ×3 IMPLANT
INTRODUCER DILATOR DOUBLE (INTRODUCER) IMPLANT
KIT TURNOVER CYSTO (KITS) ×3 IMPLANT
PACK CYSTO AR (MISCELLANEOUS) ×3 IMPLANT
SET CYSTO W/LG BORE CLAMP LF (SET/KITS/TRAYS/PACK) ×3 IMPLANT
SHEATH URETERAL 12FRX35CM (MISCELLANEOUS) ×2 IMPLANT
SOL .9 NS 3000ML IRR  AL (IV SOLUTION) ×2
SOL .9 NS 3000ML IRR AL (IV SOLUTION) ×1
SOL .9 NS 3000ML IRR UROMATIC (IV SOLUTION) ×1 IMPLANT
STENT URET 6FRX24 CONTOUR (STENTS) IMPLANT
STENT URET 6FRX26 CONTOUR (STENTS) IMPLANT
SURGILUBE 2OZ TUBE FLIPTOP (MISCELLANEOUS) ×3 IMPLANT
VALVE UROSEAL ADJ ENDO (VALVE) ×2 IMPLANT
WATER STERILE IRR 1000ML POUR (IV SOLUTION) ×3 IMPLANT

## 2019-04-01 NOTE — Interval H&P Note (Signed)
History and Physical Interval Note:  04/01/2019 8:26 AM  Gabriella Howe  has presented today for surgery, with the diagnosis of left nephrolithiasis.  The various methods of treatment have been discussed with the patient and family. After consideration of risks, benefits and other options for treatment, the patient has consented to  Procedure(s): CYSTOSCOPY/URETEROSCOPY/HOLMIUM LASER/STENT PLACEMENT (Left) as a surgical intervention.  The patient's history has been reviewed, patient examined, no change in status, stable for surgery.  I have reviewed the patient's chart and labs.  Questions were answered to the patient's satisfaction.     [place]

## 2019-04-01 NOTE — Transfer of Care (Signed)
Immediate Anesthesia Transfer of Care Note  Patient: Gabriella Howe  Procedure(s) Performed: CYSTOSCOPY/URETEROSCOPY/HOLMIUM LASER/STENT PLACEMENT (Left )  Patient Location: PACU  Anesthesia Type:General  Level of Consciousness: awake and alert   Airway & Oxygen Therapy: Patient Spontanous Breathing and Patient connected to face mask oxygen  Post-op Assessment: Report given to RN and Post -op Vital signs reviewed and stable  Post vital signs: Reviewed and stable  Last Vitals:  Vitals Value Taken Time  BP 158/72 04/01/19 1026  Temp 36.7 C 04/01/19 1026  Pulse 68 04/01/19 1028  Resp 12 04/01/19 1028  SpO2 98 % 04/01/19 1028  Vitals shown include unvalidated device data.  Last Pain:  Vitals:   04/01/19 0829  TempSrc: Tympanic  PainSc: 0-No pain         Complications: No apparent anesthesia complications

## 2019-04-01 NOTE — Discharge Instructions (Addendum)
DISCHARGE INSTRUCTIONS FOR KIDNEY STONE/URETERAL STENT   MEDICATIONS:  1. Resume all your other meds from home.  2. Rx Uribel was sent to your pharmacy if you need something for urinary frequency, urgency and burning.  ACTIVITY:  1. May resume regular activities in 24 hours. 2. No driving while on narcotic pain medications  3. Drink plenty of water  4. Continue to walk at home - you can still get blood clots when you are at home, so keep active, but don't over do it.  5. May return to work/school tomorrow or when you feel ready   BATHING:  1. You can shower.  SIGNS/SYMPTOMS TO CALL:  Please call us if you have a fever greater than 101.5, uncontrolled nausea/vomiting, uncontrolled pain, dizziness, unable to urinate, excessively bloody urine, chest pain, shortness of breath, leg swelling, leg pain, or any other concerns or questions.   Urinary frequency, urgency, blood in urine and bladder spasms are common postoperative symptoms.  You can reach [place] at [phone].    FOLLOW-UP:  1.  If you do not have an appointment for stent removal at time of discharge the office will contact you.    AMBULATORY SURGERY  DISCHARGE INSTRUCTIONS   1) The drugs that you were given will stay in your system until tomorrow so for the next 24 hours you should not:  A) Drive an automobile B) Make any legal decisions C) Drink any alcoholic beverage   2) You may resume regular meals tomorrow.  Today it is better to start with liquids and gradually work up to solid foods.  You may eat anything you prefer, but it is better to start with liquids, then soup and crackers, and gradually work up to solid foods.   3) Please notify your doctor immediately if you have any unusual bleeding, trouble breathing, redness and pain at the surgery site, drainage, fever, or pain not relieved by medication.    4) Additional Instructions:        Please contact your physician with any problems or Same Day  Surgery at [phone], Monday through Friday 6 am to 4 pm, or [place] at [facility] number at [phone].

## 2019-04-01 NOTE — Anesthesia Postprocedure Evaluation (Signed)
Anesthesia Post Note  Patient: TASHARA MURANO  Procedure(s) Performed: CYSTOSCOPY/URETEROSCOPY/HOLMIUM LASER/STENT PLACEMENT (Left )  Patient location during evaluation: PACU Anesthesia Type: General Level of consciousness: awake and alert and oriented Pain management: pain level controlled Vital Signs Assessment: post-procedure vital signs reviewed and stable Respiratory status: spontaneous breathing, nonlabored ventilation and respiratory function stable Cardiovascular status: blood pressure returned to baseline and stable Postop Assessment: no signs of nausea or vomiting Anesthetic complications: no     Last Vitals:  Vitals:   04/01/19 1056 04/01/19 1111  BP: (!) 173/87 (!) 182/96  Pulse: 73 78  Resp: 15 20  Temp:    SpO2: 94% 95%    Last Pain:  Vitals:   04/01/19 1056  TempSrc:   PainSc: 3                  Kamia Insalaco

## 2019-04-01 NOTE — OR Nursing (Signed)
Dr. Bernardo Heater in to see patient in postop prior to discharge.

## 2019-04-01 NOTE — Op Note (Signed)
Preoperative diagnosis: Left nephrolithiasis   Postoperative diagnosis: Left nephrolithiasis  Procedure:  1. Cystoscopy 2. Left ureteroscopy and stone removal 3. Ureteroscopic laser lithotripsy 4. Left ureteral stent exchange (6FR) 24 cm 5. Left retrograde pyelography with interpretation  Surgeon: Nicki Reaper C. Eliyah Mcshea, M.D.  Anesthesia: General  Complications: None  Intraoperative findings:  1.  Left retrograde pyelography post procedure showed no filling defects, stone fragments or contrast extravasation  2.  13 mm left UPJ calculus within an upper pole infundibulum  3.  Left lower pole calculi x2  EBL: Minimal  Specimens: 1. Calculus fragments for analysis   Indication: Gabriella Howe is a 85 y.o. year old patient with status post stent placement early November 2020 for an obstructing 13 mm left UPJ calculus with possible infection.  CT also showed 6-8 mm calculi x2 in addition to minute renal calculi.  She presents today for definitive stone management.  After reviewing the management options for treatment, the patient elected to proceed with the above surgical procedure(s). We have discussed the potential benefits and risks of the procedure, side effects of the proposed treatment, the likelihood of the patient achieving the goals of the procedure, and any potential problems that might occur during the procedure or recuperation. Informed consent has been obtained.  Description of procedure:  The patient was taken to the operating room and general anesthesia was induced.  The patient was placed in the dorsal lithotomy position, prepped and draped in the usual sterile fashion, and preoperative antibiotics were administered. A preoperative time-out was performed.   A 21 French cystoscope was lubricated and passed per urethra.  Panendoscopy was performed and the bladder mucosa showed no erythema, solid or papillary lesions with exception of inflammatory changes secondary to her  indwelling stent.  The left ureteral stent was grasped with endoscopic forceps and brought out to the urethral meatus.  A 0.038 sensor wire was then placed to the stent and advanced into the renal pelvis under fluoroscopic guidance followed by stent removal. A dual-lumen catheter was placed over the sensor wire and a second sensor wire was placed in a similar fashion.  A 12/14 French ureteral access sheath was placed over the working wire under fluoroscopic guidance without difficulty.  A dual channel digital flexible ureteroscope was placed through the access sheath and advanced into the renal pelvis without difficulty.  The calculus was identified within an upper pole infundibulum and was pushed back into an upper pole calyx.    A 200 micron holmium laser fiber was placed through the ureteroscope and the stone was at a setting of 0.2 J and frequency of 20 hz.  Once dusted down to <5 mm the calculus was placed in a 1.9 [place] nitinol basket and removed.  A few additional smaller fragments were basketed.  The 2 lower pole calculi were placed within the basket and relocated to the pole calyx and dusted in the similar fashion. The laser frequency was decreased to 10 Hz as the calculus diminished in size.  The frequency was then increased 40 Hz in the stone particles were further dusted.  No significantly sized stone fragments were identified under direct vision and all visualized fragments were less than the diameter of the laser tip.  Retrograde pyelogram was performed and each calyx was sequentially examined under fluoroscopic guidance and no significant size fragments were identified.  The ureteral access sheath and ureteroscope were removed in tandem and the ureter showed no evidence of injury or perforation.  A 6 FR/24  CM Contour ureteral stent was placed under fluoroscopic guidance.  The wire was then removed with an adequate stent curl noted in the renal pelvis as well as in the  bladder.  The bladder was then emptied and the procedure ended.    After anesthetic reversal the patient was transported to the PACU in stable condition.   Plan: The patient will be scheduled for cystoscopy with stent removal in approximately 1 week.

## 2019-04-01 NOTE — Anesthesia Procedure Notes (Signed)
Procedure Name: Intubation Performed by: Caryl Asp, CRNA Pre-anesthesia Checklist: Patient identified, Patient being monitored, Timeout performed, Emergency Drugs available and Suction available Patient Re-evaluated:Patient Re-evaluated prior to induction Oxygen Delivery Method: Circle system utilized Preoxygenation: Pre-oxygenation with 100% oxygen Induction Type: IV induction Ventilation: Mask ventilation without difficulty and Oral airway inserted - appropriate to patient size Laryngoscope Size: Mac and 3 Grade View: Grade I Tube type: Oral Tube size: 7.0 mm Number of attempts: 1 Airway Equipment and Method: Stylet Placement Confirmation: ETT inserted through vocal cords under direct vision,  positive ETCO2 and breath sounds checked- equal and bilateral Secured at: 21 cm Tube secured with: Tape Dental Injury: Teeth and Oropharynx as per pre-operative assessment

## 2019-04-01 NOTE — Anesthesia Preprocedure Evaluation (Signed)
Anesthesia Evaluation  Patient identified by MRN, date of birth, ID band Patient awake    Reviewed: Allergy & Precautions, NPO status , Patient's Chart, lab work & pertinent test results  History of Anesthesia Complications Negative for: history of anesthetic complications  Airway Mallampati: II  TM Distance: >3 FB Neck ROM: Full    Dental  (+) Partial Upper   Pulmonary sleep apnea (sleeps in a chair, does not wear CPAP) , neg COPD,    breath sounds clear to auscultation- rhonchi (-) wheezing      Cardiovascular hypertension, Pt. on medications (-) CAD, (-) Past MI, (-) Cardiac Stents and (-) CABG  Rhythm:Regular Rate:Normal - Systolic murmurs and - Diastolic murmurs    Neuro/Psych neg Seizures negative psych ROS   GI/Hepatic Neg liver ROS, GERD  ,  Endo/Other  diabetes, Oral Hypoglycemic Agents  Renal/GU Renal disease: nephrolithiasis.     Musculoskeletal  (+) Arthritis ,   Abdominal (+) + obese,   Peds  Hematology  (+) anemia ,   Anesthesia Other Findings Past Medical History: 2003: Adenomatous colon polyp No date: Benign neoplasm of colon No date: Carpal tunnel syndrome No date: Chest pain, unspecified No date: CHF (congestive heart failure) (HCC)     Comment:  diastolic No date: Degeneration of intervertebral disc, site unspecified No date: Diabetes mellitus     Comment:  type II No date: Encounter for long-term (current) use of other medications No date: Fluid overload No date: GERD (gastroesophageal reflux disease) No date: Heart disease, unspecified No date: Hyperlipidemia No date: Hypertension 3/12: Kidney stone     Comment:  hosp/ with stent No date: Obesity, unspecified No date: Rosacea No date: Shortness of breath No date: Unspecified asthma(493.90) No date: Unspecified sleep apnea   Reproductive/Obstetrics                             Anesthesia  Physical Anesthesia Plan  ASA: III  Anesthesia Plan: General   Post-op Pain Management:    Induction: Intravenous  PONV Risk Score and Plan: 2 and Ondansetron and Dexamethasone  Airway Management Planned: Oral ETT  Additional Equipment:   Intra-op Plan:   Post-operative Plan: Extubation in OR  Informed Consent: I have reviewed the patients History and Physical, chart, labs and discussed the procedure including the risks, benefits and alternatives for the proposed anesthesia with the patient or authorized representative who has indicated his/her understanding and acceptance.     Dental advisory given  Plan Discussed with: CRNA and Anesthesiologist  Anesthesia Plan Comments:         Anesthesia Quick Evaluation

## 2019-04-01 NOTE — H&P (Signed)
04/01/19 7:31 AM   Gabriella Howe 10-Feb-1935 [id number]  Referring provider: No referring provider defined for this encounter.   HPI: 85 y.o. female admitted early November 2020 with a 13 mm obstructing left UPJ calculus with severe hydronephrosis, pyuria and nitrite positive urine.  She underwent urgent stenting.  She was scheduled for definitive stone treatment in late November however was canceled due to yeast in her urine which was treated.  She was rescheduled however did not show up for preoperative testing and had to be canceled.  She presents today for definitive stone treatment.  Her preop urine culture grew E. coli representing colonization.  She has been started on antibiotic therapy.   PMH: Past Medical History:  Diagnosis Date  . Adenomatous colon polyp 2003  . Benign neoplasm of colon   . Carpal tunnel syndrome   . Chest pain, unspecified   . CHF (congestive heart failure) (HCC)    diastolic  . Degeneration of intervertebral disc, site unspecified   . Diabetes mellitus    type II  . Encounter for long-term (current) use of other medications   . Fluid overload   . GERD (gastroesophageal reflux disease)   . Heart disease, unspecified   . Hyperlipidemia   . Hypertension   . Kidney stone 3/12   hosp/ with stent  . Obesity, unspecified   . Rosacea   . Shortness of breath   . Unspecified asthma(493.90)   . Unspecified sleep apnea     Surgical History: Past Surgical History:  Procedure Laterality Date  . ABDOMINAL HYSTERECTOMY     partial, fibroids  . CYSTOSCOPY WITH STENT PLACEMENT Left 02/18/2019   Procedure: CYSTOSCOPY WITH STENT PLACEMENT;  Surgeon: Abbie Sons, MD;  Location: [facility] ORS;  Service: Urology;  Laterality: Left;  . ESOPHAGOGASTRODUODENOSCOPY  2000  . UMBILICAL HERNIA REPAIR  01/2004  . URETERAL STENT PLACEMENT  2/13   for L sided stone/ ecoli sepsis    Home Medications:  B-12 PO Take 1 tablet by mouth daily.   BC FAST PAIN RELIEF  PO Take by mouth as directed.   benazepril 20 MG tablet Commonly known as: LOTENSIN Take 1 tablet (20 mg total) by mouth daily.   CALCIUM-VITAMIN D PO Take 1 tablet by mouth 2 (two) times daily.   cefdinir 300 MG capsule Commonly known as: OMNICEF Take 1 capsule (300 mg total) by mouth 2 (two) times daily for 7 days.   FISH OIL PO Take 1 capsule by mouth daily.   furosemide 40 MG tablet Commonly known as: LASIX Take 0.5 tablets (20 mg total) by mouth daily.   glipiZIDE 5 MG 24 hr tablet Commonly known as: GLUCOTROL XL Take 1 tablet (5 mg total) by mouth daily with breakfast.   glucose blood test strip Commonly known as: Estate manager/land agent Use to check blood sugar once daily as directed for DM (Dx. E11.9)   ibuprofen 200 MG tablet Commonly known as: ADVIL Take 200 mg by mouth every 6 (six) hours as needed.   Magnesium 250 MG Tabs Take 250 mg by mouth daily.   metFORMIN 1000 MG tablet Commonly known as: GLUCOPHAGE Take 1 tablet (1,000 mg total) by mouth 2 (two) times daily with a meal.   Microlet Lancets Misc Use to check blood sugar once daily as directed for DM (Dx. E11.9)   multivitamin capsule Take 1 capsule by mouth daily.   omeprazole 20 MG capsule Commonly known as: PRILOSEC Take 1 capsule (20 mg total)  by mouth daily.   simvastatin 40 MG tablet Commonly known as: ZOCOR Take 1 tablet (40 mg total) by mouth at bedtime.   SINUS & ALLERGY 12 HOUR PO Take by mouth as directed.   triamcinolone cream 0.1 % Commonly known as: KENALOG APPLY TO AFFECTED AREA(S)  TOPICALLY TWO TIMES A DAY   vitamin C 500 MG tablet Commonly known as: ASCORBIC ACID Take 500 mg by mouth daily.   vitamin E 400 UNIT capsule Take 400 Units by mouth daily.    Allergies:  Allergies  Allergen Reactions  . Rofecoxib     REACTION: reaction not known  . Tetanus-Diphtheria Toxoids Td     REACTION: sick with fever  . Antihistamines, Chlorpheniramine-Type  Palpitations  . Sulfa Antibiotics Rash    Family History: Family History  Problem Relation Age of Onset  . Early death Father 24       train accident  . Diabetes Mother   . Heart disease Mother   . Diabetes Sister   . Diabetes Brother   . Colon cancer Brother   . Cancer Paternal Grandmother        unknown  . Diabetes Sister     Social History:  reports that she has never smoked. She has never used smokeless tobacco. She reports that she does not drink alcohol or use drugs.  ROS: Denies fever, chills, shortness of breath  Physical Exam: There were no vitals taken for this visit.  Constitutional:  Alert and oriented, No acute distress. HEENT: [place] AT, moist mucus membranes.  Trachea midline, no masses. Cardiovascular: No clubbing, cyanosis, or edema.  RRR Respiratory: Normal respiratory effort, no increased work of breathing.  Clear GI: Abdomen is soft, nontender, nondistended, no abdominal masses GU: No CVA tenderness Lymph: No cervical or inguinal lymphadenopathy. Skin: No rashes, bruises or suspicious lesions. Neurologic: Grossly intact, no focal deficits, moving all 4 extremities. Psychiatric: Normal mood and affect.   Assessment & Plan:   85 y.o. female with a 10 mm obstructing left UPJ calculus status post stenting.  She also has 2 renal calculi measuring 6-7 mm.  She presents for ureteroscopic removal.  The indications and nature of the planned procedure were discussed as well as the potential  benefits and expected outcome.  Alternatives have been discussed in detail. The most common complications and side effects were discussed including but not limited to infection/sepsis; blood loss; damage to urethra, bladder, ureter, kidney; need for multiple surgeries; need for prolonged stent placement as well as general anesthesia. Although uncommon she was also informed of the possibility that the calculus may not be able to be treated due to inability to obtain access to the  upper ureter. All of her questions were answered and she desires to proceed.    Abbie Sons, [place] [address], [place], [place] [address] [phone]

## 2019-04-02 ENCOUNTER — Telehealth: Payer: Self-pay | Admitting: Urology

## 2019-04-02 NOTE — Telephone Encounter (Signed)
-----   Message from Abbie Sons, MD sent at 04/02/2019  8:22 AM EST ----- Regarding: RE: Stent removal If room is available double book the 1030 new patient appointment slot on 7/14 at 1030 ----- Message ----- From: Benard Halsted Sent: 04/01/2019   1:46 PM EST To: Abbie Sons, MD Subject: RE: Stent removal                              You are totally booked next week, were do you want me to put her? ----- Message ----- From: Abbie Sons, MD Sent: 04/01/2019  10:50 AM EST To: Gerhard Perches Subject: Stent removal                                  Please schedule cystoscopy with stent removal 7-10 days

## 2019-04-02 NOTE — Telephone Encounter (Signed)
App made Pt is aware

## 2019-04-10 ENCOUNTER — Ambulatory Visit (INDEPENDENT_AMBULATORY_CARE_PROVIDER_SITE_OTHER): Payer: Medicare Other | Admitting: Urology

## 2019-04-10 ENCOUNTER — Other Ambulatory Visit: Payer: Self-pay

## 2019-04-10 VITALS — Ht 62.0 in | Wt 205.0 lb

## 2019-04-10 DIAGNOSIS — N200 Calculus of kidney: Secondary | ICD-10-CM

## 2019-04-10 DIAGNOSIS — Z87440 Personal history of urinary (tract) infections: Secondary | ICD-10-CM | POA: Diagnosis not present

## 2019-04-10 LAB — URINALYSIS, COMPLETE
Bilirubin, UA: NEGATIVE
Glucose, UA: NEGATIVE
Ketones, UA: NEGATIVE
Nitrite, UA: NEGATIVE
Specific Gravity, UA: 1.015 (ref 1.005–1.030)
Urobilinogen, Ur: 0.2 mg/dL (ref 0.2–1.0)
pH, UA: 5.5 (ref 5.0–7.5)

## 2019-04-10 LAB — MICROSCOPIC EXAMINATION
Epithelial Cells (non renal): >10 /hpf — AB (ref 0–10)
RBC, Urine: >30 /hpf — AB (ref 0–2)

## 2019-04-10 MED ORDER — CIPROFLOXACIN HCL 500 MG PO TABS
500.0000 mg | ORAL_TABLET | Freq: Once | ORAL | Status: AC
  Administered 2019-04-10: 500 mg via ORAL

## 2019-04-10 NOTE — Progress Notes (Signed)
Indications: Patient is 85 y.o., female status post left ureteroscopic stone removal 04/01/2019.  She had no postoperative problems.  The patient is presenting today for stent removal.  Procedure:  Flexible Cystoscopy with stent removal [id number])  Timeout was performed and the correct patient, procedure and participants were identified.    Description:  The patient was prepped and draped in the usual sterile fashion. Flexible cystosopy was performed.  The stent was visualized, grasped, and removed intact without difficulty. The patient tolerated the procedure well.  A single dose of oral antibiotics was given.  Complications:  None  Plan: She was instructed to call for flank pain or fever post stent removal.  Follow-up 6 weeks for KUB/office visit.  John Giovanni, MD

## 2019-04-10 NOTE — Addendum Note (Signed)
Addended by: Verlene Mayer A on: 04/10/2019 01:20 PM   Modules accepted: Orders

## 2019-04-21 ENCOUNTER — Other Ambulatory Visit: Payer: Self-pay | Admitting: Family Medicine

## 2019-05-06 ENCOUNTER — Telehealth: Payer: Self-pay | Admitting: Family Medicine

## 2019-05-06 DIAGNOSIS — E119 Type 2 diabetes mellitus without complications: Secondary | ICD-10-CM

## 2019-05-06 DIAGNOSIS — E1169 Type 2 diabetes mellitus with other specified complication: Secondary | ICD-10-CM

## 2019-05-06 DIAGNOSIS — D509 Iron deficiency anemia, unspecified: Secondary | ICD-10-CM

## 2019-05-06 DIAGNOSIS — E785 Hyperlipidemia, unspecified: Secondary | ICD-10-CM

## 2019-05-06 DIAGNOSIS — I10 Essential (primary) hypertension: Secondary | ICD-10-CM

## 2019-05-06 NOTE — Telephone Encounter (Signed)
-----   Message from Cloyd Stagers, RT sent at 04/30/2019  1:14 PM EST ----- Regarding: Lab Orders for Thursday 2.11.2021 Please place lab orders for Thursday 2.11.2021, office visit for physical on Wednesday 2.17.2021 Thank you, Dyke Maes RT(R)

## 2019-05-08 ENCOUNTER — Other Ambulatory Visit: Payer: Self-pay

## 2019-05-08 ENCOUNTER — Ambulatory Visit (INDEPENDENT_AMBULATORY_CARE_PROVIDER_SITE_OTHER): Payer: Medicare Other

## 2019-05-08 ENCOUNTER — Other Ambulatory Visit (INDEPENDENT_AMBULATORY_CARE_PROVIDER_SITE_OTHER): Payer: Medicare Other

## 2019-05-08 ENCOUNTER — Ambulatory Visit: Payer: Medicare Other

## 2019-05-08 VITALS — BP 140/90 | Wt 211.0 lb

## 2019-05-08 DIAGNOSIS — E119 Type 2 diabetes mellitus without complications: Secondary | ICD-10-CM

## 2019-05-08 DIAGNOSIS — E1169 Type 2 diabetes mellitus with other specified complication: Secondary | ICD-10-CM | POA: Diagnosis not present

## 2019-05-08 DIAGNOSIS — D509 Iron deficiency anemia, unspecified: Secondary | ICD-10-CM

## 2019-05-08 DIAGNOSIS — E785 Hyperlipidemia, unspecified: Secondary | ICD-10-CM | POA: Diagnosis not present

## 2019-05-08 DIAGNOSIS — Z0000 Encounter for general adult medical examination without abnormal findings: Secondary | ICD-10-CM

## 2019-05-08 DIAGNOSIS — I10 Essential (primary) hypertension: Secondary | ICD-10-CM

## 2019-05-08 LAB — CBC WITH DIFFERENTIAL/PLATELET
Basophils Absolute: 0 10*3/uL (ref 0.0–0.1)
Basophils Relative: 0.6 % (ref 0.0–3.0)
Eosinophils Absolute: 0.2 10*3/uL (ref 0.0–0.7)
Eosinophils Relative: 3.3 % (ref 0.0–5.0)
HCT: 41.3 % (ref 36.0–46.0)
Hemoglobin: 13.8 g/dL (ref 12.0–15.0)
Lymphocytes Relative: 25.6 % (ref 12.0–46.0)
Lymphs Abs: 1.7 10*3/uL (ref 0.7–4.0)
MCHC: 33.5 g/dL (ref 30.0–36.0)
MCV: 91.3 fl (ref 78.0–100.0)
Monocytes Absolute: 0.4 10*3/uL (ref 0.1–1.0)
Monocytes Relative: 6.2 % (ref 3.0–12.0)
Neutro Abs: 4.4 10*3/uL (ref 1.4–7.7)
Neutrophils Relative %: 64.3 % (ref 43.0–77.0)
Platelets: 180 10*3/uL (ref 150.0–400.0)
RBC: 4.52 Mil/uL (ref 3.87–5.11)
RDW: 13.5 % (ref 11.5–15.5)
WBC: 6.8 10*3/uL (ref 4.0–10.5)

## 2019-05-08 LAB — COMPREHENSIVE METABOLIC PANEL
ALT: 17 U/L (ref 0–35)
AST: 16 U/L (ref 0–37)
Albumin: 4.1 g/dL (ref 3.5–5.2)
Alkaline Phosphatase: 66 U/L (ref 39–117)
BUN: 24 mg/dL — ABNORMAL HIGH (ref 6–23)
CO2: 25 mEq/L (ref 19–32)
Calcium: 9.5 mg/dL (ref 8.4–10.5)
Chloride: 106 mEq/L (ref 96–112)
Creatinine, Ser: 0.77 mg/dL (ref 0.40–1.20)
GFR: 71.68 mL/min (ref >60.00)
Glucose, Bld: 166 mg/dL — ABNORMAL HIGH (ref 70–99)
Potassium: 3.9 mEq/L (ref 3.5–5.1)
Sodium: 138 mEq/L (ref 135–145)
Total Bilirubin: 1 mg/dL (ref 0.2–1.2)
Total Protein: 7.1 g/dL (ref 6.0–8.3)

## 2019-05-08 LAB — HEMOGLOBIN A1C: Hgb A1c MFr Bld: 7.6 % — ABNORMAL HIGH (ref 4.6–6.5)

## 2019-05-08 LAB — LIPID PANEL
Cholesterol: 141 mg/dL (ref 0–200)
HDL: 59.6 mg/dL (ref >39.00)
LDL Cholesterol: 56 mg/dL (ref 0–99)
NonHDL: 81.57
Total CHOL/HDL Ratio: 2
Triglycerides: 128 mg/dL (ref 0.0–149.0)
VLDL: 25.6 mg/dL (ref 0.0–40.0)

## 2019-05-08 LAB — TSH: TSH: 1.11 u[IU]/mL (ref 0.35–4.50)

## 2019-05-08 NOTE — Progress Notes (Signed)
Subjective:   Gabriella Howe is a 85 y.o. female who presents for Medicare Annual (Subsequent) preventive examination.  Review of Systems: N/A    This visit is being conducted through telemedicine via telephone at the nurse health advisor's home address due to the COVID-19 pandemic. This patient has given me verbal consent via doximity to conduct this visit, patient states they are participating from their home address. Patient and myself are on the telephone call. There is no referral for this visit. Some vital signs may be absent or patient reported.    Patient identification: identified by name, DOB, and current address   Cardiac Risk Factors include: advanced age (>2men, >71 women);diabetes mellitus;hypertension;dyslipidemia     Objective:     Vitals: BP 140/90   Wt 211 lb (95.7 kg)   BMI 38.59 kg/m   Body mass index is 38.59 kg/m.  Advanced Directives 05/08/2019 04/01/2019 03/26/2019 02/18/2019 05/06/2018 02/14/2018 12/13/2017  Does Patient Have a Medical Advance Directive? Yes Yes Yes Yes Yes No Yes  Type of Paramedic of [place];Living will [place];Living will [place];Living will [place];Living will [place];Living will - [place];Living will  Does patient want to make changes to medical advance directive? - No - Patient declined - No - Patient declined - - No - Patient declined  Copy of [place] in Chart? No - copy requested No - copy requested No - copy requested - No - copy requested - Yes  Would patient like information on creating a medical advance directive? - - - - - No - Patient declined -    Tobacco Social History   Tobacco Use  Smoking Status Never Smoker  Smokeless Tobacco Never Used     Counseling given: Not Answered   Clinical Intake:  Pre-visit preparation completed: Yes  Pain : 0-10 Pain Score: 5  Pain  Type: Chronic pain Pain Location: (all over body) Pain Descriptors / Indicators: Aching Pain Onset: More than a month ago Pain Frequency: Constant     Nutritional Risks: None Diabetes: Yes CBG done?: No Did pt. bring in CBG monitor from home?: No  How often do you need to have someone help you when you read instructions, pamphlets, or other written materials from your doctor or pharmacy?: 1 - Never What is the last grade level you completed in school?: GED, some college courses  Interpreter Needed?: No  Information entered by :: CJohnson, LPN  Past Medical History:  Diagnosis Date  . Adenomatous colon polyp 2003  . Benign neoplasm of colon   . Carpal tunnel syndrome   . Chest pain, unspecified   . CHF (congestive heart failure) (HCC)    diastolic  . Degeneration of intervertebral disc, site unspecified   . Diabetes mellitus    type II  . Encounter for long-term (current) use of other medications   . Fluid overload   . GERD (gastroesophageal reflux disease)   . Heart disease, unspecified   . Hyperlipidemia   . Hypertension   . Kidney stone 3/12   hosp/ with stent  . Obesity, unspecified   . Rosacea   . Shortness of breath   . Unspecified asthma(493.90)   . Unspecified sleep apnea    Past Surgical History:  Procedure Laterality Date  . ABDOMINAL HYSTERECTOMY     partial, fibroids  . CYSTOSCOPY WITH STENT PLACEMENT Left 02/18/2019   Procedure: CYSTOSCOPY WITH STENT PLACEMENT;  Surgeon: Abbie Sons, MD;  Location: [facility] ORS;  Service: Urology;  Laterality: Left;  . CYSTOSCOPY/URETEROSCOPY/HOLMIUM LASER/STENT PLACEMENT Left 04/01/2019   Procedure: CYSTOSCOPY/URETEROSCOPY/HOLMIUM LASER/STENT PLACEMENT;  Surgeon: Abbie Sons, MD;  Location: [facility] ORS;  Service: Urology;  Laterality: Left;  . ESOPHAGOGASTRODUODENOSCOPY  2000  . UMBILICAL HERNIA REPAIR  01/2004  . URETERAL STENT PLACEMENT  2/13   for L sided stone/ ecoli sepsis   Family History  Problem  Relation Age of Onset  . Early death Father 73       train accident  . Diabetes Mother   . Heart disease Mother   . Diabetes Sister   . Diabetes Brother   . Colon cancer Brother   . Cancer Paternal Grandmother        unknown  . Diabetes Sister    Social History   Socioeconomic History  . Marital status: Widowed    Spouse name: Not on file  . Number of children: Not on file  . Years of education: Not on file  . Highest education level: Not on file  Occupational History  . Not on file  Tobacco Use  . Smoking status: Never Smoker  . Smokeless tobacco: Never Used  Substance and Sexual Activity  . Alcohol use: No    Alcohol/week: 0.0 standard drinks  . Drug use: No  . Sexual activity: Never  Other Topics Concern  . Not on file  Social History Narrative  . Not on file   Social Determinants of Health   Financial Resource Strain: Low Risk   . Difficulty of Paying Living Expenses: Not hard at all  Food Insecurity: No Food Insecurity  . Worried About Charity fundraiser in the Last Year: Never true  . Ran Out of Food in the Last Year: Never true  Transportation Needs: No Transportation Needs  . Lack of Transportation (Medical): No  . Lack of Transportation (Non-Medical): No  Physical Activity: Inactive  . Days of Exercise per Week: 0 days  . Minutes of Exercise per Session: 0 min  Stress: No Stress Concern Present  . Feeling of Stress : Not at all  Social Connections:   . Frequency of Communication with Friends and Family: Not on file  . Frequency of Social Gatherings with Friends and Family: Not on file  . Attends Religious Services: Not on file  . Active Member of Clubs or Organizations: Not on file  . Attends Archivist Meetings: Not on file  . Marital Status: Not on file    Outpatient Encounter Medications as of 05/08/2019  Medication Sig  . Ascorbic Acid (VITAMIN C) 500 MG tablet Take 500 mg by mouth daily.    . Aspirin-Caffeine (BC FAST PAIN RELIEF  PO) Take by mouth as directed.    . benazepril (LOTENSIN) 20 MG tablet TAKE 1 TABLET BY MOUTH  DAILY  . CALCIUM-VITAMIN D PO Take 1 tablet by mouth 2 (two) times daily.   . Cyanocobalamin (B-12 PO) Take 1 tablet by mouth daily.  . furosemide (LASIX) 40 MG tablet TAKE ONE-HALF TABLET BY  MOUTH DAILY  . glipiZIDE (GLUCOTROL XL) 5 MG 24 hr tablet TAKE 1 TABLET BY MOUTH  DAILY WITH BREAKFAST  . glucose blood (BAYER CONTOUR TEST) test strip Use to check blood sugar once daily as directed for DM (Dx. E11.9)  . ibuprofen (ADVIL) 200 MG tablet Take 200 mg by mouth every 6 (six) hours as needed.  . Magnesium 250 MG TABS Take 250 mg  by mouth daily.  . metFORMIN (GLUCOPHAGE) 1000 MG tablet Take 1 tablet (1,000 mg total) by mouth 2 (two) times daily with a meal.  . Meth-Hyo-M Bl-Na Phos-Ph Sal (URIBEL) 118 MG CAPS Take 1 capsule (118 mg total) by mouth 3 (three) times daily as needed (Urinary frequency, urgency, burning).  Marland Kitchen MICROLET LANCETS MISC Use to check blood sugar once daily as directed for DM (Dx. E11.9)  . Multiple Vitamin (MULTIVITAMIN) capsule Take 1 capsule by mouth daily.    . Omega-3 Fatty Acids (FISH OIL PO) Take 1 capsule by mouth daily.   Marland Kitchen omeprazole (PRILOSEC) 20 MG capsule TAKE 1 CAPSULE BY MOUTH  DAILY  . Pseudoephedrine HCl (SINUS & ALLERGY 12 HOUR PO) Take by mouth as directed.    . simvastatin (ZOCOR) 40 MG tablet TAKE 1 TABLET BY MOUTH AT  BEDTIME  . triamcinolone cream (KENALOG) 0.1 % APPLY TO AFFECTED AREA(S)  TOPICALLY TWO TIMES A DAY  . vitamin E 400 UNIT capsule Take 400 Units by mouth daily.     No facility-administered encounter medications on file as of 05/08/2019.    Activities of Daily Living In your present state of health, do you have any difficulty performing the following activities: 05/08/2019 03/26/2019  Hearing? N N  Vision? N N  Difficulty concentrating or making decisions? N N  Walking or climbing stairs? N N  Dressing or bathing? N N  Doing errands,  shopping? N N  Preparing Food and eating ? N -  Using the Toilet? N -  In the past six months, have you accidently leaked urine? N -  Do you have problems with loss of bowel control? N -  Managing your Medications? N -  Managing your Finances? N -  Housekeeping or managing your Housekeeping? N -  Some recent data might be hidden    Patient Care Team: Tower, Wynelle Fanny, MD as PCP - General Vin-Parikh, Deirdre Peer, MD as Referring Physician (Ophthalmology) Charmian Muff, DDS as Referring Physician (Dentistry)    Assessment:   This is a routine wellness examination for Tarisha.  Exercise Activities and Dietary recommendations Current Exercise Habits: The patient does not participate in regular exercise at present, Exercise limited by: None identified  Goals    . DIET - INCREASE WATER INTAKE     Starting 05/06/2018, I will attempt to drink at least 6 glasses of water daily and to continue to make healthy food choices.     . Patient Stated     05/08/2019, I will maintain and continue medications as prescribed.       Fall Risk Fall Risk  05/08/2019 05/06/2018 12/24/2017 04/23/2017 04/20/2016  Falls in the past year? 1 0 - No No  Comment loss balance and fell - - - -  Number falls in past yr: 0 - - - -  Injury with Fall? 0 - - - -  Risk for fall due to : Medication side effect - Impaired balance/gait - -  Follow up Falls prevention discussed;Falls evaluation completed - - - -   Is the patient's home free of loose throw rugs in walkways, pet beds, electrical cords, etc?   yes      Grab bars in the bathroom? no      Handrails on the stairs?   yes      Adequate lighting?   yes  Timed Get Up and Go performed: N/A  Depression Screen PHQ 2/9 Scores 05/08/2019 05/06/2018 04/23/2017 04/20/2016  PHQ - 2 Score  0 0 1 1  PHQ- 9 Score 0 0 1 -     Cognitive Function MMSE - Mini Mental State Exam 05/08/2019 05/06/2018 04/23/2017 04/20/2016  Orientation to time 5 5 5 5   Orientation to Place 5 5  5 5   Registration 3 3 3 3   Attention/ Calculation 5 0 0 0  Recall 3 2 3 2   Recall-comments - unable to recall 2 of 3 words - pt was unable to recall 1 of 3 words  Language- name 2 objects - 0 0 0  Language- repeat 1 1 1 1   Language- follow 3 step command - 3 3 3   Language- read & follow direction - 0 0 0  Write a sentence - 0 0 0  Copy design - 0 0 0  Total score - 19 20 19   Mini Cog  Mini-Cog screen was completed. Maximum score is 22. A value of 0 denotes this part of the MMSE was not completed or the patient failed this part of the Mini-Cog screening.       Immunization History  Administered Date(s) Administered  . Influenza Split 01/24/2011  . Influenza Whole 05/10/2009  . Influenza,inj,Quad PF,6+ Mos 11/28/2013, 04/14/2015, 04/20/2016, 01/26/2017, 05/06/2018  . Pneumococcal Conjugate-13 11/28/2013  . Pneumococcal Polysaccharide-23 03/28/2003    Qualifies for Shingles Vaccine? yes  Screening Tests Health Maintenance  Topic Date Due  . FOOT EXAM  04/30/2018  . INFLUENZA VACCINE  10/26/2018  . HEMOGLOBIN A1C  05/08/2019  . OPHTHALMOLOGY EXAM  09/12/2019  . DEXA SCAN  Completed  . PNA vac Low Risk Adult  Completed    Cancer Screenings: Lung: Low Dose CT Chest recommended if Age 68-80 years, 30 pack-year currently smoking OR have quit w/in 15years. Patient does not qualify. Breast:  Mammogram: No longer required   Bone Density/Dexa: completed 03/28/1999 Colorectal: no longer required  Additional Screenings:  Hepatitis C Screening: N/A     Plan:   Patient will maintain and continue medications as prescribed.    I have personally reviewed and noted the following in the patient's chart:   . Medical and social history . Use of alcohol, tobacco or illicit drugs  . Current medications and supplements . Functional ability and status . Nutritional status . Physical activity . Advanced directives . List of other physicians . Hospitalizations, surgeries, and ER  visits in previous 12 months . Vitals . Screenings to include cognitive, depression, and falls . Referrals and appointments  In addition, I have reviewed and discussed with patient certain preventive protocols, quality metrics, and best practice recommendations. A written personalized care plan for preventive services as well as general preventive health recommendations were provided to patient.     Andrez Grime, LPN  [id number]

## 2019-05-08 NOTE — Progress Notes (Signed)
PCP notes:  Health Maintenance: Flu vaccine- postponed due to COVID vaccine Needs foot exam    Abnormal Screenings: none   Patient concerns: none   Nurse concerns: none   Next PCP appt.: 05/14/2019 @ 9:30 am

## 2019-05-08 NOTE — Patient Instructions (Signed)
Gabriella Howe , Thank you for taking time to come for your Medicare Wellness Visit. I appreciate your ongoing commitment to your health goals. Please review the following plan we discussed and let me know if I can assist you in the future.   Screening recommendations/referrals: Colonoscopy: no longer required Mammogram: no longer required Bone Density: completed 03/28/1999 Recommended yearly ophthalmology/optometry visit for glaucoma screening and checkup Recommended yearly dental visit for hygiene and checkup  Vaccinations: Influenza vaccine: postpone due to COVID vaccine Pneumococcal vaccine: Completed series Tdap vaccine: not candidate for tetanus Shingles vaccine: discussed    Advanced directives: Please bring a copy of your POA (Power of Attorney) and/or Living Will to your next appointment.   Conditions/risks identified: diabetes, hypertension, hyperlipidemia  Next appointment: 05/14/2019 @ 9:30 am    Preventive Care 65 Years and Older, Female Preventive care refers to lifestyle choices and visits with your health care provider that can promote health and wellness. What does preventive care include?  A yearly physical exam. This is also called an annual well check.  Dental exams once or twice a year.  Routine eye exams. Ask your health care provider how often you should have your eyes checked.  Personal lifestyle choices, including:  Daily care of your teeth and gums.  Regular physical activity.  Eating a healthy diet.  Avoiding tobacco and drug use.  Limiting alcohol use.  Practicing safe sex.  Taking low-dose aspirin every day.  Taking vitamin and mineral supplements as recommended by your health care provider. What happens during an annual well check? The services and screenings done by your health care provider during your annual well check will depend on your age, overall health, lifestyle risk factors, and family history of disease. Counseling  Your health  care provider may ask you questions about your:  Alcohol use.  Tobacco use.  Drug use.  Emotional well-being.  Home and relationship well-being.  Sexual activity.  Eating habits.  History of falls.  Memory and ability to understand (cognition).  Work and work Statistician.  Reproductive health. Screening  You may have the following tests or measurements:  Height, weight, and BMI.  Blood pressure.  Lipid and cholesterol levels. These may be checked every 5 years, or more frequently if you are over 6 years old.  Skin check.  Lung cancer screening. You may have this screening every year starting at age 61 if you have a 30-pack-year history of smoking and currently smoke or have quit within the past 15 years.  Fecal occult blood test (FOBT) of the stool. You may have this test every year starting at age 7.  Flexible sigmoidoscopy or colonoscopy. You may have a sigmoidoscopy every 5 years or a colonoscopy every 10 years starting at age 43.  Hepatitis C blood test.  Hepatitis B blood test.  Sexually transmitted disease (STD) testing.  Diabetes screening. This is done by checking your blood sugar (glucose) after you have not eaten for a while (fasting). You may have this done every 1-3 years.  Bone density scan. This is done to screen for osteoporosis. You may have this done starting at age 34.  Mammogram. This may be done every 1-2 years. Talk to your health care provider about how often you should have regular mammograms. Talk with your health care provider about your test results, treatment options, and if necessary, the need for more tests. Vaccines  Your health care provider may recommend certain vaccines, such as:  Influenza vaccine. This is recommended every  year.  Tetanus, diphtheria, and acellular pertussis (Tdap, Td) vaccine. You may need a Td booster every 10 years.  Zoster vaccine. You may need this after age 71.  Pneumococcal 13-valent conjugate  (PCV13) vaccine. One dose is recommended after age 86.  Pneumococcal polysaccharide (PPSV23) vaccine. One dose is recommended after age 40. Talk to your health care provider about which screenings and vaccines you need and how often you need them. This information is not intended to replace advice given to you by your health care provider. Make sure you discuss any questions you have with your health care provider. Document Released: 04/09/2015 Document Revised: 12/01/2015 Document Reviewed: 01/12/2015 Elsevier Interactive Patient Education  2017 [place] Prevention in the Home Falls can cause injuries. They can happen to people of all ages. There are many things you can do to make your home safe and to help prevent falls. What can I do on the outside of my home?  Regularly fix the edges of walkways and driveways and fix any cracks.  Remove anything that might make you trip as you walk through a door, such as a raised step or threshold.  Trim any bushes or trees on the path to your home.  Use bright outdoor lighting.  Clear any walking paths of anything that might make someone trip, such as rocks or tools.  Regularly check to see if handrails are loose or broken. Make sure that both sides of any steps have handrails.  Any raised decks and porches should have guardrails on the edges.  Have any leaves, snow, or ice cleared regularly.  Use sand or salt on walking paths during winter.  Clean up any spills in your garage right away. This includes oil or grease spills. What can I do in the bathroom?  Use night lights.  Install grab bars by the toilet and in the tub and shower. Do not use towel bars as grab bars.  Use non-skid mats or decals in the tub or shower.  If you need to sit down in the shower, use a plastic, non-slip stool.  Keep the floor dry. Clean up any water that spills on the floor as soon as it happens.  Remove soap buildup in the tub or shower  regularly.  Attach bath mats securely with double-sided non-slip rug tape.  Do not have throw rugs and other things on the floor that can make you trip. What can I do in the bedroom?  Use night lights.  Make sure that you have a light by your bed that is easy to reach.  Do not use any sheets or blankets that are too big for your bed. They should not hang down onto the floor.  Have a firm chair that has side arms. You can use this for support while you get dressed.  Do not have throw rugs and other things on the floor that can make you trip. What can I do in the kitchen?  Clean up any spills right away.  Avoid walking on wet floors.  Keep items that you use a lot in easy-to-reach places.  If you need to reach something above you, use a strong step stool that has a grab bar.  Keep electrical cords out of the way.  Do not use floor polish or wax that makes floors slippery. If you must use wax, use non-skid floor wax.  Do not have throw rugs and other things on the floor that can make you trip. What can  I do with my stairs?  Do not leave any items on the stairs.  Make sure that there are handrails on both sides of the stairs and use them. Fix handrails that are broken or loose. Make sure that handrails are as long as the stairways.  Check any carpeting to make sure that it is firmly attached to the stairs. Fix any carpet that is loose or worn.  Avoid having throw rugs at the top or bottom of the stairs. If you do have throw rugs, attach them to the floor with carpet tape.  Make sure that you have a light switch at the top of the stairs and the bottom of the stairs. If you do not have them, ask someone to add them for you. What else can I do to help prevent falls?  Wear shoes that:  Do not have high heels.  Have rubber bottoms.  Are comfortable and fit you well.  Are closed at the toe. Do not wear sandals.  If you use a stepladder:  Make sure that it is fully  opened. Do not climb a closed stepladder.  Make sure that both sides of the stepladder are locked into place.  Ask someone to hold it for you, if possible.  Clearly mark and make sure that you can see:  Any grab bars or handrails.  First and last steps.  Where the edge of each step is.  Use tools that help you move around (mobility aids) if they are needed. These include:  Canes.  Walkers.  Scooters.  Crutches.  Turn on the lights when you go into a dark area. Replace any light bulbs as soon as they burn out.  Set up your furniture so you have a clear path. Avoid moving your furniture around.  If any of your floors are uneven, fix them.  If there are any pets around you, be aware of where they are.  Review your medicines with your doctor. Some medicines can make you feel dizzy. This can increase your chance of falling. Ask your doctor what other things that you can do to help prevent falls. This information is not intended to replace advice given to you by your health care provider. Make sure you discuss any questions you have with your health care provider. Document Released: 01/07/2009 Document Revised: 08/19/2015 Document Reviewed: 04/17/2014 Elsevier Interactive Patient Education  2017 [employer].

## 2019-05-14 ENCOUNTER — Encounter: Payer: Medicare Other | Admitting: Family Medicine

## 2019-05-14 DIAGNOSIS — Z0289 Encounter for other administrative examinations: Secondary | ICD-10-CM

## 2019-05-25 ENCOUNTER — Other Ambulatory Visit: Payer: Self-pay | Admitting: Family Medicine

## 2019-06-05 ENCOUNTER — Telehealth: Payer: Self-pay | Admitting: Family Medicine

## 2019-06-05 NOTE — Telephone Encounter (Signed)
Many of the results are stable, but her Diabetes is getting worse.   It appears she missed her visit with Dr. Glori Bickers. I would recommend she schedule an office visit to discuss this since she missed the visit she should have in February.   Lesleigh Noe

## 2019-06-05 NOTE — Telephone Encounter (Signed)
Patient advised and appointment rescheduled for CPE part 2 for 06/30/19

## 2019-06-05 NOTE — Telephone Encounter (Addendum)
Pt was seen virtually today with Medicare Wellness Nurse - advised to call to discuss her lab results.  Pt requesting results, not yet discussed, as they were done a month ago. I made patient aware that Dr Glori Bickers had to leave d/t an emergency and will not be back this week. Aware that I will send to another provider to see if they are able to interpret the results in the prelim until Dr Glori Bickers is back Monday to give further recommendations.   Will send to Dr Einar Pheasant to give results in Dr Marliss Coots absence. Labs 05/08/19

## 2019-06-05 NOTE — Chronic Care Management (AMB) (Signed)
  Chronic Care Management   Note  06/05/2019 Name: ANNEKA MULLALY MRN: [id number] DOB: 10/13/1934  [name] is a 85 y.o. year old female who is a primary care patient of Tower, Wynelle Fanny, MD. I reached out to [name] by phone today in response to a referral sent by Ms. Jaynie Bream Amiri's PCP, Tower, Wynelle Fanny, MD.   Ms. Srader was given information about Chronic Care Management services today including:  1. CCM service includes personalized support from designated clinical staff supervised by her physician, including individualized plan of care and coordination with other care providers 2. 24/7 contact phone numbers for assistance for urgent and routine care needs. 3. Service will only be billed when office clinical staff spend 20 minutes or more in a month to coordinate care. 4. Only one practitioner may furnish and bill the service in a calendar month. 5. The patient may stop CCM services at any time (effective at the end of the month) by phone call to the office staff.   Patient agreed to services and verbal consent obtained.   Follow up plan:   Raynicia Dukes [employer] Scheduler

## 2019-06-16 ENCOUNTER — Telehealth: Payer: Self-pay

## 2019-06-16 DIAGNOSIS — I10 Essential (primary) hypertension: Secondary | ICD-10-CM

## 2019-06-16 DIAGNOSIS — K219 Gastro-esophageal reflux disease without esophagitis: Secondary | ICD-10-CM

## 2019-06-16 NOTE — Telephone Encounter (Signed)
I would like to request a referral for Gabriella Howe to chronic care management pharmacy services for the following conditions:   Essential hypertension, benign  [I10]  GERD [K21.9]  Debbora Dus, PharmD Clinical Pharmacist [facility] at [facility] [phone]

## 2019-06-18 ENCOUNTER — Ambulatory Visit: Payer: Self-pay | Admitting: Urology

## 2019-06-18 ENCOUNTER — Encounter: Payer: Self-pay | Admitting: Urology

## 2019-06-18 NOTE — Chronic Care Management (AMB) (Signed)
Chronic Care Management Pharmacy  Name: Gabriella Howe  MRN: [id number] DOB: 23-Nov-1934  Chief Complaint/ HPI  Gabriella Howe,  85 y.o., female presents for their Initial CCM visit with the clinical pharmacist via telephone.  PCP : Abner Greenspan, MD  Their chronic conditions include: type 2 diabetes, heart failure, hypertension, hyperlipidemia, GERD  Patient concerns: denies medication changes in past 6 months; denies questions  Office Visits:  05/08/19: [facility] - no medication changes  Consult Visit:  02/27/19: Urology - follow up for kidney stone treatment, recommended ureteroscopy and clearing out left renal stone  Allergies  Allergen Reactions  . Rofecoxib     REACTION: reaction not known  . Tetanus-Diphtheria Toxoids Td     REACTION: sick with fever  . Antihistamines, Chlorpheniramine-Type Palpitations  . Sulfa Antibiotics Rash   Medications: Outpatient Encounter Medications as of 06/19/2019  Medication Sig  . Ascorbic Acid (VITAMIN C) 500 MG tablet Take 500 mg by mouth daily.    . Aspirin-Caffeine (BC FAST PAIN RELIEF PO) Take by mouth as directed.    . benazepril (LOTENSIN) 20 MG tablet TAKE 1 TABLET BY MOUTH  DAILY  . CALCIUM-VITAMIN D PO Take 1 tablet by mouth 2 (two) times daily.   . Cyanocobalamin (B-12 PO) Take 1 tablet by mouth daily.  . furosemide (LASIX) 40 MG tablet TAKE ONE-HALF TABLET BY  MOUTH DAILY  . glipiZIDE (GLUCOTROL XL) 5 MG 24 hr tablet TAKE 1 TABLET BY MOUTH  DAILY WITH BREAKFAST  . glucose blood (BAYER CONTOUR TEST) test strip Use to check blood sugar once daily as directed for DM (Dx. E11.9)  . ibuprofen (ADVIL) 200 MG tablet Take 200 mg by mouth every 6 (six) hours as needed.  . Magnesium 250 MG TABS Take 250 mg by mouth daily.  . metFORMIN (GLUCOPHAGE) 1000 MG tablet Take 1 tablet (1,000 mg total) by mouth 2 (two) times daily with a meal.  . Meth-Hyo-M Bl-Na Phos-Ph Sal (URIBEL) 118 MG CAPS Take 1 capsule (118 mg total) by mouth 3 (three)  times daily as needed (Urinary frequency, urgency, burning).  Marland Kitchen MICROLET LANCETS MISC Use to check blood sugar once daily as directed for DM (Dx. E11.9)  . Multiple Vitamin (MULTIVITAMIN) capsule Take 1 capsule by mouth daily.    . Omega-3 Fatty Acids (FISH OIL PO) Take 1 capsule by mouth daily.   Marland Kitchen omeprazole (PRILOSEC) 20 MG capsule TAKE 1 CAPSULE BY MOUTH  DAILY  . Pseudoephedrine HCl (SINUS & ALLERGY 12 HOUR PO) Take by mouth as directed.    . simvastatin (ZOCOR) 40 MG tablet TAKE 1 TABLET BY MOUTH AT  BEDTIME  . triamcinolone cream (KENALOG) 0.1 % APPLY TO AFFECTED AREA(S)  TOPICALLY TWO TIMES A DAY  . vitamin E 400 UNIT capsule Take 400 Units by mouth daily.     No facility-administered encounter medications on file as of 06/19/2019.   Current Diagnosis/Assessment: Goals    . DIET - INCREASE WATER INTAKE     Starting 05/06/2018, I will attempt to drink at least 6 glasses of water daily and to continue to make healthy food choices.     . Patient Stated     05/08/2019, I will maintain and continue medications as prescribed.    . Pharmacy Care Plan     CARE PLAN ENTRY  Current Barriers:  . Chronic Disease Management support, education, and care coordination needs related to type 2 diabetes, heart failure, hypertension, hyperlipidemia, GERD  Pharmacist Clinical Goal(s):  .  Continue current medications as prescribed.  . Improve blood glucose control with A1c goal of less than 7%.  . Maintain blood pressure within goal of less than 140/90 mmHg. Recommend a home blood pressure monitor. . Remain up to date on vaccinations. Recommend shingles vaccines (Shingrix) two-dose series from local pharmacy. . Recommend safe over the counter medication use. Consider daily antihistamine such as cetirizine or loratadine and using Flonase nasal spray for additional  congestion as an alternative to daily pseudoephedrine.   Interventions: . Comprehensive medication review performed.  Patient Self Care  Activities:  . Self administers medications as prescribed  Initial goal documentation      Diabetes   Recent Relevant Labs: Lab Results  Component Value Date/Time   HGBA1C 7.6 (H) 05/08/2019 09:26 AM   HGBA1C 7.1 (H) 11/05/2018 08:58 AM   HGBA1C 7.4 (H) 11/08/2012 04:06 PM   HGBA1C 7.2 (H) 05/23/2011 06:28 AM   MICROALBUR 0.2 01/14/2008 10:08 AM   MICROALBUR 4.8 (H) 10/07/2007 09:01 AM    Checking BG: Never Patient has failed these meds in past: none reported Patient is currently uncontrolled on the following medications:   Glipizide 5 mg - 1 tablet daily with breakfast  Metformin 1000 mg - 1 tablet BID with meal  Last diabetic eye exam:  Lab Results  Component Value Date/Time   HMDIABEYEEXA No Retinopathy 09/12/2018 12:00 AM    Last diabetic foot exam:  Lab Results  Component Value Date/Time   HMDIABFOOTEX yes 07/23/2009 12:00 AM    We discussed: pt reports she wasn't taking her medication every day during antibiotic treatment and kidney stone, doing better now; routine returned to normal early January; does have a monitor if needed, but does not check blood glucose  Exercise: stays active around the house Diet: eats frozen dinners often, tries to eat healthy   Plan: Continue current medications   Hypertension   CMP Latest Ref Rng & Units 05/08/2019 03/27/2019 02/20/2019  Glucose 70 - 99 mg/dL 166(H) - 173(H)  BUN 6 - 23 mg/dL 24(H) - 17  Creatinine 0.40 - 1.20 mg/dL 0.77 - 0.81  Sodium 135 - 145 mEq/L 138 - 140  Potassium 3.5 - 5.1 mEq/L 3.9 4.1 3.2(L)  Chloride 96 - 112 mEq/L 106 - 105  CO2 19 - 32 mEq/L 25 - 25  Calcium 8.4 - 10.5 mg/dL 9.5 - 8.7(L)  Total Protein 6.0 - 8.3 g/dL 7.1 - -  Total Bilirubin 0.2 - 1.2 mg/dL 1.0 - -  Alkaline Phos 39 - 117 U/L 66 - -  AST 0 - 37 U/L 16 - -  ALT 0 - 35 U/L 17 - -   Office blood pressures are: BP Readings from Last 3 Encounters:  05/08/19 140/90  04/01/19 (!) 158/79  02/26/19 (!) 148/78   BP < 140/90  mmHg Patient has failed these meds in the past: none Patient checks BP at home: none, does not have a monitor   Patient is currently controlled on the following medications:   Benazepril 20 mg - 1 tablet daily  Furosemide 40 mg - 1/2 tablet daily  We discussed: been on Lasix for 20+ years, confirmed adherence; reports blood pressure was also elevated in clinic during time of kidney stone, but has returned to normal  Plan: Continue current medications  Hyperlipidemia   Lipid Panel     Component Value Date/Time   CHOL 141 05/08/2019 0926   TRIG 128.0 05/08/2019 0926   HDL 59.60 05/08/2019 0926   CHOLHDL 2  05/08/2019 0926   VLDL 25.6 05/08/2019 0926   LDLCALC 56 05/08/2019 0926    The ASCVD Risk score Mikey Bussing DC Jr., et al., 2013) failed to calculate for the following reasons:   The 2013 ASCVD risk score is only valid for ages 58 to 40   LDL goal < 100 Patient has failed these meds in past: none Patient is currently controlled on the following medications:   Simvastatin 40 mg - 1 tablet daily   Omega 3 fatty acids - 1 capsule daily   We discussed: confirmed adherence  Plan: Continue current medications  GERD   Patient has failed these meds in past: none Patient is currently controlled on the following medications:   Omeprazole 20 mg - 1 capsule daily  We discussed: doing well, does have symptoms if she misses a dose - takes before breakfast  Plan: Continue current medications   Vaccines   Reviewed and discussed patient's vaccination history.    Immunization History  Administered Date(s) Administered  . Influenza Split 01/24/2011  . Influenza Whole 05/10/2009  . Influenza,inj,Quad PF,6+ Mos 11/28/2013, 04/14/2015, 04/20/2016, 01/26/2017, 05/06/2018  . Pneumococcal Conjugate-13 11/28/2013  . Pneumococcal Polysaccharide-23 03/28/2003   Plan: Recommended patient receive Shingrix vaccine series. Reports she plans to get the vaccine. She has received both doses of  COVID-19 vaccine and had reaction to tetanus vaccine previously.   Medication Management  Misc: triamcinolone cream 0.1% - uses PRN; Uribel 118 mg - 1 capsule TID (short term - not taking anymore)  OTCs: vitamin C 500 mg - daily, aspirin/caffeine - not taking, calcium-vitamin D - taking, B12 - taking, ibuprofen 200 mg - not taking, Aleve - PRN, magnesium 250 mg OTC- not taking, multivitamin - daily, pseudoephedrine - [employer] brand severe allergy sinus - every day, vitamin E - daily  Pharmacy/Benefits: [facility] + [facility] (Mail order)  Adherence: uses pillbox  Affordability: no concerns   CCM Follow Up: none, declines further CCM services  Debbora Dus, PharmD Clinical Pharmacist [facility] at [facility] [phone]

## 2019-06-19 ENCOUNTER — Other Ambulatory Visit: Payer: Self-pay

## 2019-06-19 ENCOUNTER — Ambulatory Visit: Payer: Medicare Other

## 2019-06-19 DIAGNOSIS — E1169 Type 2 diabetes mellitus with other specified complication: Secondary | ICD-10-CM

## 2019-06-19 DIAGNOSIS — E785 Hyperlipidemia, unspecified: Secondary | ICD-10-CM

## 2019-06-19 DIAGNOSIS — E119 Type 2 diabetes mellitus without complications: Secondary | ICD-10-CM

## 2019-06-19 DIAGNOSIS — I5032 Chronic diastolic (congestive) heart failure: Secondary | ICD-10-CM

## 2019-06-19 DIAGNOSIS — I10 Essential (primary) hypertension: Secondary | ICD-10-CM

## 2019-06-19 DIAGNOSIS — K219 Gastro-esophageal reflux disease without esophagitis: Secondary | ICD-10-CM

## 2019-06-19 NOTE — Patient Instructions (Signed)
June 19, 2019  Dear [name],  It was a pleasure meeting you during our initial appointment on June 19, 2019. Below is a summary of the goals we discussed and components of chronic care management. Please contact me anytime with questions or concerns.   Visit Information  Goals Addressed            This Visit's Progress   . Pharmacy Care Plan       CARE PLAN ENTRY  Current Barriers:  . Chronic Disease Management support, education, and care coordination needs related to type 2 diabetes, heart failure, hypertension, hyperlipidemia, GERD  Pharmacist Clinical Goal(s):  Marland Kitchen Continue current medications as prescribed.  . Improve blood glucose control with A1c goal of less than 7%.  . Maintain blood pressure within goal of less than 140/90 mmHg. Recommend a home blood pressure monitor. . Remain up to date on vaccinations. Recommend shingles vaccines (Shingrix) two-dose series from local pharmacy. . Recommend safe over the counter medication use. Consider daily antihistamine such as cetirizine or loratadine and using Flonase nasal spray for additional  congestion as an alternative to daily pseudoephedrine.   Interventions: . Comprehensive medication review performed.  Patient Self Care Activities:  . Self administers medications as prescribed  Initial goal documentation      Ms. Beighley declines further chronic care management services. An un-enrollment form will be mailed separately.   The patient verbalized understanding of instructions provided today and agreed to receive a mailed copy of patient instruction and/or educational materials.  Debbora Dus, PharmD Clinical Pharmacist [facility] at [facility] [phone]

## 2019-06-30 ENCOUNTER — Other Ambulatory Visit: Payer: Self-pay

## 2019-06-30 ENCOUNTER — Ambulatory Visit (INDEPENDENT_AMBULATORY_CARE_PROVIDER_SITE_OTHER): Payer: Medicare Other | Admitting: Family Medicine

## 2019-06-30 ENCOUNTER — Encounter: Payer: Self-pay | Admitting: Family Medicine

## 2019-06-30 VITALS — BP 136/82 | HR 76 | Temp 97.7°F | Ht 60.25 in | Wt 210.5 lb

## 2019-06-30 DIAGNOSIS — G4733 Obstructive sleep apnea (adult) (pediatric): Secondary | ICD-10-CM

## 2019-06-30 DIAGNOSIS — I10 Essential (primary) hypertension: Secondary | ICD-10-CM | POA: Diagnosis not present

## 2019-06-30 DIAGNOSIS — Z0000 Encounter for general adult medical examination without abnormal findings: Secondary | ICD-10-CM

## 2019-06-30 DIAGNOSIS — E119 Type 2 diabetes mellitus without complications: Secondary | ICD-10-CM | POA: Diagnosis not present

## 2019-06-30 DIAGNOSIS — I5032 Chronic diastolic (congestive) heart failure: Secondary | ICD-10-CM

## 2019-06-30 DIAGNOSIS — E1169 Type 2 diabetes mellitus with other specified complication: Secondary | ICD-10-CM

## 2019-06-30 DIAGNOSIS — D509 Iron deficiency anemia, unspecified: Secondary | ICD-10-CM

## 2019-06-30 DIAGNOSIS — E785 Hyperlipidemia, unspecified: Secondary | ICD-10-CM

## 2019-06-30 MED ORDER — OMEPRAZOLE 20 MG PO CPDR: 20.0000 mg | DELAYED_RELEASE_CAPSULE | Freq: Every day | ORAL | 3 refills | Status: DC

## 2019-06-30 MED ORDER — GLIPIZIDE ER 10 MG PO TB24: 10.0000 mg | ORAL_TABLET | Freq: Every day | ORAL | 3 refills | Status: DC

## 2019-06-30 MED ORDER — METFORMIN HCL 1000 MG PO TABS: 1000.0000 mg | ORAL_TABLET | Freq: Two times a day (BID) | ORAL | 3 refills | Status: DC

## 2019-06-30 MED ORDER — BENAZEPRIL HCL 20 MG PO TABS: 20.0000 mg | ORAL_TABLET | Freq: Every day | ORAL | 3 refills | Status: DC

## 2019-06-30 MED ORDER — SIMVASTATIN 40 MG PO TABS: 40.0000 mg | ORAL_TABLET | Freq: Every day | ORAL | 3 refills | Status: DC

## 2019-06-30 MED ORDER — FUROSEMIDE 40 MG PO TABS: 20.0000 mg | ORAL_TABLET | Freq: Every day | ORAL | 3 refills | Status: DC

## 2019-06-30 NOTE — Assessment & Plan Note (Signed)
Reviewed health habits including diet and exercise and skin cancer prevention Reviewed appropriate screening tests for age  Also reviewed health mt list, fam hx and immunization status , as well as social and family history   See HPI Labs reviewed amw reviewed She declines mammograms and dexa  Out aged colonoscopy  Enc more exercise as well as fall prevention  Had her covid vaccines  Enc discussion of shingrix

## 2019-06-30 NOTE — Progress Notes (Signed)
Subjective:    Patient ID: Gabriella Howe, female    DOB: 1934/09/09, 85 y.o.   MRN: [id number]  This visit occurred during the SARS-CoV-2 public health emergency.  Safety protocols were in place, including screening questions prior to the visit, additional usage of staff PPE, and extensive cleaning of exam room while observing appropriate contact time as indicated for disinfecting solutions.    HPI  Here for health maintenance exam and to review chronic medical problems    Wt Readings from Last 3 Encounters:  06/30/19 210 lb 8 oz (95.5 kg)  05/08/19 211 lb (95.7 kg)  04/10/19 205 lb (93 kg)  wt is stable since feb  40.77 kg/m   Has been feeling ok overall   Still works part time for [employer]  This gives her purpose  Had her covid vaccines   She had amw on 2/11 Noted did not get flu shot due to the pandemic  She generally declines mammograms and dexa   Last mammogram 2013-still declines  Self breast exam -no lumps   Declines dexa  Falls- 2 times (had a hard time getting up but did not injure herself)  Lost balance turning around  [facility] and help balance  She has a walker if she needs it / uses a cane   [employer] -takes D and calcium  Ex[facility] - [facility]   Zoster status - had shingles in 2018   Eye exam 6/20   bp is stable today  No cp or palpitations or headaches or edema  No side effects to medicines  BP Readings from Last 3 Encounters:  06/30/19 136/82  05/08/19 140/90  04/01/19 (!) 158/79     Pulse Readings from Last 3 Encounters:  06/30/19 76  04/01/19 97  0000000 86   H/o diastolic dysfunction  Takes lasix    OSA - not back to using cpap / she does not think she needs it now  When she sleeps in a recliner and that helps a lot  Feels rested during the day    Past h/o iron def anemia-infusions in the past  Lab Results  Component Value Date   WBC  6.8 05/08/2019   HGB 13.8 05/08/2019   HCT 41.3 05/08/2019   MCV 91.3 05/08/2019   PLT 180.0 05/08/2019    DM2 Lab Results  Component Value Date   HGBA1C 7.6 (H) 05/08/2019  glucose 166 at time of draw  Up from 7.1 last time  Pt states from nov-feb she was dealing with renal stones and that was stressful   Eating has not changed much- too many carbs  Some snacking / some sweets  Taking glipizide and metformin  On statin and ace   Eats every 3-4 hours or blood glucose will drop    Hyperlipidemia Lab Results  Component Value Date   CHOL 141 05/08/2019   CHOL 123 05/06/2018   CHOL 119 10/26/2017   Lab Results  Component Value Date   HDL 59.60 05/08/2019   HDL 50.00 05/06/2018   HDL 54.60 10/26/2017   Lab Results  Component Value Date   LDLCALC 56 05/08/2019   LDLCALC 50 05/06/2018   LDLCALC 35 10/26/2017   Lab Results  Component Value Date   TRIG 128.0 05/08/2019   TRIG 115.0 05/06/2018   TRIG 148.0 10/26/2017   Lab Results  Component Value Date  CHOLHDL 2 05/08/2019   CHOLHDL 2 05/06/2018   CHOLHDL 2 10/26/2017   No results found for: LDLDIRECT  Well controlled   Patient Active Problem List   Diagnosis Date Noted  . DJD (degenerative joint disease) 02/26/2019  . Iron deficiency anemia 04/30/2017  . History of shingles 10/03/2016  . Routine general medical examination at a health care facility 04/14/2015  . Back pain 07/28/2014  . Knee pain 07/28/2014  . Incomplete emptying of bladder 11/28/2012  . Increased frequency of urination 11/28/2012  . Encounter for Medicare annual wellness exam 11/19/2012  . History of kidney stones 11/19/2012  . Hydronephrosis of left kidney 06/07/2011  . DIASTOLIC HEART FAILURE, CHRONIC 07/29/2009  . Hyperlipidemia associated with type 2 diabetes mellitus ([place]) 09/13/2006  . Morbid obesity ([place]) 09/10/2006  . CARPAL TUNNEL SYNDROME 09/10/2006  . Essential hypertension 09/10/2006  . DIASTOLIC DYSFUNCTION [id number]   . REACTIVE AIRWAY DISEASE 09/10/2006  . GERD 09/10/2006  . ROSACEA 09/10/2006  . Degeneration of lumbar or lumbosacral intervertebral disc 09/10/2006  . OSA (obstructive sleep apnea) 09/10/2006  . COLONIC POLYPS, HX OF 09/10/2006  . Diabetes type 2, controlled ([place]) 09/06/2006   Past Medical History:  Diagnosis Date  . Adenomatous colon polyp 2003  . Benign neoplasm of colon   . Carpal tunnel syndrome   . Chest pain, unspecified   . CHF (congestive heart failure) (HCC)    diastolic  . Degeneration of intervertebral disc, site unspecified   . Diabetes mellitus    type II  . Encounter for long-term (current) use of other medications   . Fluid overload   . GERD (gastroesophageal reflux disease)   . Heart disease, unspecified   . Hyperlipidemia   . Hypertension   . Kidney stone 3/12   hosp/ with stent  . Obesity, unspecified   . Rosacea   . Shortness of breath   . Unspecified asthma(493.90)   . Unspecified sleep apnea    Past Surgical History:  Procedure Laterality Date  . ABDOMINAL HYSTERECTOMY     partial, fibroids  . CYSTOSCOPY WITH STENT PLACEMENT Left 02/18/2019   Procedure: CYSTOSCOPY WITH STENT PLACEMENT;  Surgeon: Abbie Sons, MD;  Location: [facility];  Service: Urology;  Laterality: Left;  . CYSTOSCOPY/URETEROSCOPY/HOLMIUM LASER/STENT PLACEMENT Left 04/01/2019   Procedure: CYSTOSCOPY/URETEROSCOPY/HOLMIUM LASER/STENT PLACEMENT;  Surgeon: Abbie Sons, MD;  Location: [facility];  Service: Urology;  Laterality: Left;  . ESOPHAGOGASTRODUODENOSCOPY  2000  . UMBILICAL HERNIA REPAIR  01/2004  . URETERAL STENT PLACEMENT  2/13   for L sided stone/ ecoli sepsis   Social History   Tobacco Use  . Smoking status: Never Smoker  . Smokeless tobacco: Never Used  Substance Use Topics  . Alcohol use: No    Alcohol/week: 0.0 standard drinks  . Drug use: No   Family History  Problem Relation Age of Onset  . Early death Father 6       train accident  . Diabetes  Mother   . Heart disease Mother   . Diabetes Sister   . Diabetes Brother   . Colon cancer Brother   . Cancer Paternal Grandmother        unknown  . Diabetes Sister    Allergies  Allergen Reactions  . Rofecoxib     REACTION: reaction not known  . Tetanus-Diphtheria Toxoids Td     REACTION: sick with fever  . Antihistamines, Chlorpheniramine-Type Palpitations  . Sulfa Antibiotics Rash   Current Outpatient Medications on File Prior to  Visit  Medication Sig Dispense Refill  . Ascorbic Acid (VITAMIN C) 500 MG tablet Take 500 mg by mouth daily.      . Aspirin-Caffeine (BC FAST PAIN RELIEF PO) Take 1 packet by mouth daily as needed.     Marland Kitchen CALCIUM-VITAMIN D PO Take 1 tablet by mouth 2 (two) times daily.     . Cyanocobalamin (B-12 PO) Take 1 tablet by mouth daily.    Marland Kitchen glucose blood (BAYER CONTOUR TEST) test strip Use to check blood sugar once daily as directed for DM (Dx. E11.9) 100 each 0  . MICROLET LANCETS MISC Use to check blood sugar once daily as directed for DM (Dx. E11.9) 100 each 0  . Multiple Vitamin (MULTIVITAMIN) capsule Take 1 capsule by mouth daily.      . naproxen sodium (ALEVE) 220 MG tablet Take 220 mg by mouth daily as needed.    . Omega-3 Fatty Acids (FISH OIL PO) Take 1 capsule by mouth daily.     . Pseudoephedrine HCl (SINUS & ALLERGY 12 HOUR PO) Take by mouth. Taking daily (combination product for allergy and sinus)    . triamcinolone cream (KENALOG) 0.1 % APPLY TO AFFECTED AREA(S)  TOPICALLY TWO TIMES A DAY 30 g 1  . vitamin E 400 UNIT capsule Take 400 Units by mouth daily.       No current facility-administered medications on file prior to visit.     Review of Systems  Constitutional: Negative for activity change, appetite change, fatigue, fever and unexpected weight change.  HENT: Negative for congestion, rhinorrhea, sore throat and trouble swallowing.   Eyes: Negative for pain, redness, itching and visual disturbance.  Respiratory: Negative for cough, chest  tightness, shortness of breath and wheezing.   Cardiovascular: Positive for leg swelling. Negative for chest pain and palpitations.       Ankle swelling   Gastrointestinal: Negative for abdominal pain, blood in stool, constipation, diarrhea and nausea.  Endocrine: Negative for cold intolerance, heat intolerance, polydipsia and polyuria.  Genitourinary: Negative for difficulty urinating, dysuria, frequency and urgency.       Slow urinary stream with occ sediment - s/p bouts with kidney stones this winter  Musculoskeletal: Positive for arthralgias, back pain and gait problem. Negative for joint swelling and myalgias.  Skin: Negative for pallor and rash.  Neurological: Negative for dizziness, tremors, weakness, numbness and headaches.  Hematological: Negative for adenopathy. Does not bruise/bleed easily.  Psychiatric/Behavioral: Negative for decreased concentration and dysphoric mood. The patient is not nervous/anxious.        Objective:   Physical Exam Constitutional:      General: She is not in acute distress.    Appearance: Normal appearance. She is well-developed. She is obese. She is not ill-appearing or diaphoretic.  HENT:     Head: Normocephalic and atraumatic.     Right Ear: Tympanic membrane, ear canal and external ear normal.     Left Ear: Tympanic membrane, ear canal and external ear normal.     Nose: Nose normal. No congestion.     Mouth/Throat:     Mouth: Mucous membranes are moist.     Pharynx: Oropharynx is clear. No posterior oropharyngeal erythema.  Eyes:     General: No scleral icterus.    Extraocular Movements: Extraocular movements intact.     Conjunctiva/sclera: Conjunctivae normal.     Pupils: Pupils are equal, round, and reactive to light.  Neck:     Thyroid: No thyromegaly.     Vascular: No carotid  bruit or JVD.  Cardiovascular:     Rate and Rhythm: Normal rate and regular rhythm.     Pulses: Normal pulses.     Heart sounds: Normal heart sounds. No gallop.    Pulmonary:     Effort: Pulmonary effort is normal. No respiratory distress.     Breath sounds: Normal breath sounds. No wheezing.     Comments: Good air exch Chest:     Chest wall: No tenderness.  Abdominal:     General: Bowel sounds are normal. There is no distension or abdominal bruit.     Palpations: Abdomen is soft. There is no mass.     Tenderness: There is no abdominal tenderness.     Hernia: No hernia is present.  Genitourinary:    Comments: Breast exam: No mass, nodules, thickening, tenderness, bulging, retraction, inflamation, nipple discharge or skin changes noted.  No axillary or clavicular LA.     Musculoskeletal:        General: No tenderness. Normal range of motion.     Cervical back: Normal range of motion and neck supple. No rigidity. No muscular tenderness.     Right lower leg: No edema.     Left lower leg: No edema.  Lymphadenopathy:     Cervical: No cervical adenopathy.  Skin:    General: Skin is warm and dry.     Coloration: Skin is not pale.     Findings: No erythema or rash.     Comments: Solar lentigines diffusely sks scattered   Small area of erythema and induration L post neck consistent with healing insect bite   Neurological:     Mental Status: She is alert. Mental status is at baseline.     Cranial Nerves: No cranial nerve deficit.     Motor: No abnormal muscle tone.     Coordination: Coordination normal.     Gait: Gait normal.     Deep Tendon Reflexes: Reflexes are normal and symmetric.  Psychiatric:        Mood and Affect: Mood normal.        Cognition and Memory: Cognition and memory normal.     Comments: Very mentally sharp            Assessment & Plan:   Problem List Items Addressed This Visit      Cardiovascular and Mediastinum   Essential hypertension    bp in fair control at this time  BP Readings from Last 1 Encounters:  06/30/19 136/82   No changes needed Most recent labs reviewed  Disc lifstyle change with low sodium  diet and exercise        Relevant Medications   benazepril (LOTENSIN) 20 MG tablet   furosemide (LASIX) 40 MG tablet   simvastatin (ZOCOR) 40 MG tablet   DIASTOLIC HEART FAILURE, CHRONIC    Pt notes more swelling this winter  No sob or other symptoms  Continue lasix      Relevant Medications   benazepril (LOTENSIN) 20 MG tablet   furosemide (LASIX) 40 MG tablet   simvastatin (ZOCOR) 40 MG tablet     Respiratory   OSA (obstructive sleep apnea)    Pt no longer uses cpap  Sleeps propped up in recliner and she snores less/feels rested  Wt loss recommended        Endocrine   Diabetes type 2, controlled ([place])    Lab Results  Component Value Date   HGBA1C 7.6 (H) 05/08/2019   Notably higher  Eating  poorly-disc strategy to change this  Will inc glipizide xl to 10 mg daily  Also check glucose and watch for hypoglycemia  [facility] eye care On ace and statin  Wt loss recommended       Relevant Medications   benazepril (LOTENSIN) 20 MG tablet   metFORMIN (GLUCOPHAGE) 1000 MG tablet   simvastatin (ZOCOR) 40 MG tablet   glipiZIDE (GLUCOTROL XL) 10 MG 24 hr tablet   Hyperlipidemia associated with type 2 diabetes mellitus (HCC)    Disc goals for lipids and reasons to control them Rev last labs with pt Rev low sat fat diet in detail  Controlled with simvastatin and diet       Relevant Medications   benazepril (LOTENSIN) 20 MG tablet   metFORMIN (GLUCOPHAGE) 1000 MG tablet   simvastatin (ZOCOR) 40 MG tablet   glipiZIDE (GLUCOTROL XL) 10 MG 24 hr tablet     Other   Morbid obesity (HCC)    Discussed how this problem influences overall health and the risks it imposes  Reviewed plan for weight loss with lower calorie diet (via better food choices and also portion control or program like weight watchers) and exercise building up to or more than 30 minutes 5 days per week including some aerobic activity   Causing more trouble with falls  Strongly enc more exercise safely (such as  chair yoga)        Relevant Medications   metFORMIN (GLUCOPHAGE) 1000 MG tablet   glipiZIDE (GLUCOTROL XL) 10 MG 24 hr tablet   Routine general medical examination at a health care facility - Primary    Reviewed health habits including diet and exercise and skin cancer prevention Reviewed appropriate screening tests for age  Also reviewed health mt list, fam hx and immunization status , as well as social and family history   See HPI Labs reviewed amw reviewed She declines mammograms and dexa  Out aged colonoscopy  Enc more exercise as well as fall prevention  Had her covid vaccines  Enc discussion of shingrix       Iron deficiency anemia    Normal cbc today       Other Visit Diagnoses    Chronic diastolic (congestive) heart failure (HCC)   (Chronic)     Relevant Medications   benazepril (LOTENSIN) 20 MG tablet   furosemide (LASIX) 40 MG tablet   simvastatin (ZOCOR) 40 MG tablet   Type 2 diabetes mellitus with other specified complication, without long-term current use of insulin (HCC)   (Chronic)     Relevant Medications   benazepril (LOTENSIN) 20 MG tablet   metFORMIN (GLUCOPHAGE) 1000 MG tablet   simvastatin (ZOCOR) 40 MG tablet   glipiZIDE (GLUCOTROL XL) 10 MG 24 hr tablet   Morbid (severe) obesity due to excess calories (HCC)   (Chronic)     Relevant Medications   metFORMIN (GLUCOPHAGE) 1000 MG tablet   glipiZIDE (GLUCOTROL XL) 10 MG 24 hr tablet

## 2019-06-30 NOTE — Patient Instructions (Addendum)
Look into streaming exercise programs on [employer]   (chair yoga and walking in place are helpful)  Use a cane or walker to help prevent falls   Increase your glipizide to 10 mg daily (the extended release)  Double up on what you have and the new px will be for 10 mg and you can just take one daily  Do your best to eat better /diabetic diet  Watch closely for low glucose  Keep [place] posted - start checking levels   Try to get most of your carbohydrates from produce (with the exception of white potatoes)  Eat less bread/pasta/rice/snack foods/cereals/sweets and other items from the middle of the grocery store (processed carbs)  Follow up in 3 months for diabetes

## 2019-06-30 NOTE — Assessment & Plan Note (Signed)
Pt no longer uses cpap  Sleeps propped up in recliner and she snores less/feels rested  Wt loss recommended

## 2019-06-30 NOTE — Assessment & Plan Note (Signed)
Lab Results  Component Value Date   HGBA1C 7.6 (H) 05/08/2019   Notably higher  Eating poorly-disc strategy to change this  Will inc glipizide xl to 10 mg daily  Also check glucose and watch for hypoglycemia  [facility] eye care On ace and statin  Wt loss recommended

## 2019-06-30 NOTE — Assessment & Plan Note (Signed)
Disc goals for lipids and reasons to control them Rev last labs with pt Rev low sat fat diet in detail  Controlled with simvastatin and diet  

## 2019-06-30 NOTE — Assessment & Plan Note (Signed)
Normal cbc today 

## 2019-06-30 NOTE — Assessment & Plan Note (Signed)
bp in fair control at this time  BP Readings from Last 1 Encounters:  06/30/19 136/82   No changes needed Most recent labs reviewed  Disc lifstyle change with low sodium diet and exercise

## 2019-06-30 NOTE — Assessment & Plan Note (Signed)
Discussed how this problem influences overall health and the risks it imposes  Reviewed plan for weight loss with lower calorie diet (via better food choices and also portion control or program like weight watchers) and exercise building up to or more than 30 minutes 5 days per week including some aerobic activity   Causing more trouble with falls  Strongly enc more exercise safely (such as chair yoga)

## 2019-06-30 NOTE — Assessment & Plan Note (Signed)
Pt notes more swelling this winter  No sob or other symptoms  Continue lasix

## 2019-08-05 ENCOUNTER — Other Ambulatory Visit: Payer: Self-pay

## 2019-08-05 ENCOUNTER — Ambulatory Visit (INDEPENDENT_AMBULATORY_CARE_PROVIDER_SITE_OTHER): Payer: Medicare Other | Admitting: Family Medicine

## 2019-08-05 ENCOUNTER — Encounter: Payer: Self-pay | Admitting: Family Medicine

## 2019-08-05 VITALS — BP 132/90 | HR 80 | Temp 97.2°F | Ht 60.25 in | Wt 212.3 lb

## 2019-08-05 DIAGNOSIS — R600 Localized edema: Secondary | ICD-10-CM | POA: Diagnosis not present

## 2019-08-05 DIAGNOSIS — I10 Essential (primary) hypertension: Secondary | ICD-10-CM | POA: Diagnosis not present

## 2019-08-05 DIAGNOSIS — I5032 Chronic diastolic (congestive) heart failure: Secondary | ICD-10-CM | POA: Diagnosis not present

## 2019-08-05 LAB — BASIC METABOLIC PANEL
BUN: 19 mg/dL (ref 6–23)
CO2: 26 mEq/L (ref 19–32)
Calcium: 10.1 mg/dL (ref 8.4–10.5)
Chloride: 104 mEq/L (ref 96–112)
Creatinine, Ser: 0.83 mg/dL (ref 0.40–1.20)
GFR: 65.7 mL/min (ref >60.00)
Glucose, Bld: 118 mg/dL — ABNORMAL HIGH (ref 70–99)
Potassium: 3.9 mEq/L (ref 3.5–5.1)
Sodium: 139 mEq/L (ref 135–145)

## 2019-08-05 LAB — BRAIN NATRIURETIC PEPTIDE: Pro B Natriuretic peptide (BNP): 57 pg/mL (ref 0.0–100.0)

## 2019-08-05 MED ORDER — FUROSEMIDE 40 MG PO TABS: 40.0000 mg | ORAL_TABLET | Freq: Every day | ORAL | 0 refills | Status: DC

## 2019-08-05 NOTE — Patient Instructions (Addendum)
Aim for 64 oz of fluids per day - mostly water  This should help swelling   Support stockings are a good option - try some light support knee highs over the counter   Elevate feet when you sit-best you can  Think about what you can do under desk to help with this   Let's aim for less sodium  Frozen dinners are loaded  See the handout on the DASH eating plan   Let's increase your furosemide to 40 mg daily  If this helps- we will need to re check labs in 1-2 weeks to make sure your potassium level is ok    Also labs today- BNP test as well as chemistries   Weight loss helps also

## 2019-08-05 NOTE — Progress Notes (Signed)
Subjective:    Patient ID: Gabriella Howe, female    DOB: 07-26-1934, 85 y.o.   MRN: [id number]  This visit occurred during the SARS-CoV-2 public health emergency.  Safety protocols were in place, including screening questions prior to the visit, additional usage of staff PPE, and extensive cleaning of exam room while observing appropriate contact time as indicated for disinfecting solutions.    HPI  Pt presents with leg swelling for a month (both but L is worse)  Wt Readings from Last 3 Encounters:  08/05/19 212 lb 5 oz (96.3 kg)  06/30/19 210 lb 8 oz (95.5 kg)  05/08/19 211 lb (95.7 kg)   41.12 kg/m   Swelling is worse since November- had kidney stone issues    She has h/o diastolic HF chronic  (thinks she was diagnosed over 43 y ago related to vioxx possibly) Takes lasix 20 mg  Really bad swelling if she forgets it   When ankles get very swollen - feels some chest pressure and a little sob    No LE surgeries   Not eating more sodium  Not eating out  She eats frozen dinners   She does not drink enough water but drinks green tea       nsaid -takes aleve  (about every other day once per day)   BP Readings from Last 3 Encounters:  08/05/19 132/90  06/30/19 136/82  05/08/19 140/90   Pulse Readings from Last 3 Encounters:  08/05/19 80  06/30/19 76  04/01/19 97     Lab Results  Component Value Date   CREATININE 0.77 05/08/2019   BUN 24 (H) 05/08/2019   NA 138 05/08/2019   K 3.9 05/08/2019   CL 106 05/08/2019   CO2 25 05/08/2019     Stress echo 6/11 Stress ECG: There were no stress arrhythmias or conduction  abnormalities. The stress ECG was negative for ischemia.   --------------------------------------------------------------------  Baseline:   - LV global systolic function was normal.  - Normal wall motion; no LV regional wall motion abnormalities.  Peak stress:   - LV global systolic function was hyperdynamic.  - Normal wall motion; no LV  regional wall motion abnormalities.   --------------------------------------------------------------------  Stress echo results: Left ventricular ejection fraction was normal  at rest and with stress. There was no echocardiographic evidence for  stress-induced ischemia.   --------------------------------------------------------------------  Prepared and Electronically Authenticated by   Patient Active Problem List   Diagnosis Date Noted  . Pedal edema 08/05/2019  . DJD (degenerative joint disease) 02/26/2019  . Iron deficiency anemia 04/30/2017  . History of shingles 10/03/2016  . Routine general medical examination at a health care facility 04/14/2015  . Back pain 07/28/2014  . Knee pain 07/28/2014  . Incomplete emptying of bladder 11/28/2012  . Increased frequency of urination 11/28/2012  . Encounter for Medicare annual wellness exam 11/19/2012  . History of kidney stones 11/19/2012  . Hydronephrosis of left kidney 06/07/2011  . DIASTOLIC HEART FAILURE, CHRONIC 07/29/2009  . Hyperlipidemia associated with type 2 diabetes mellitus ([place]) 09/13/2006  . Morbid obesity ([place]) 09/10/2006  . CARPAL TUNNEL SYNDROME 09/10/2006  . Essential hypertension 09/10/2006  . DIASTOLIC DYSFUNCTION [id number]  . REACTIVE AIRWAY DISEASE 09/10/2006  . GERD 09/10/2006  . ROSACEA 09/10/2006  . Degeneration of lumbar or lumbosacral intervertebral disc 09/10/2006  . OSA (obstructive sleep apnea) 09/10/2006  . COLONIC POLYPS, HX OF 09/10/2006  . Diabetes type 2, controlled ([place]) 09/06/2006   Past Medical History:  Diagnosis Date  . Adenomatous colon polyp 2003  . Benign neoplasm of colon   . Carpal tunnel syndrome   . Chest pain, unspecified   . CHF (congestive heart failure) ([facility])    diastolic  . Degeneration of intervertebral disc, site unspecified   . Diabetes mellitus    type II  . Encounter for long-term (current) use of other medications   . Fluid overload   . GERD (gastroesophageal  reflux disease)   . Heart disease, unspecified   . Hyperlipidemia   . Hypertension   . Kidney stone 3/12   hosp/ with stent  . Obesity, unspecified   . Rosacea   . Shortness of breath   . Unspecified asthma(493.90)   . Unspecified sleep apnea    Past Surgical History:  Procedure Laterality Date  . ABDOMINAL HYSTERECTOMY     partial, fibroids  . CYSTOSCOPY WITH STENT PLACEMENT Left 02/18/2019   Procedure: CYSTOSCOPY WITH STENT PLACEMENT;  Surgeon: Abbie Sons, MD;  Location: [facility];  Service: Urology;  Laterality: Left;  . CYSTOSCOPY/URETEROSCOPY/HOLMIUM LASER/STENT PLACEMENT Left 04/01/2019   Procedure: CYSTOSCOPY/URETEROSCOPY/HOLMIUM LASER/STENT PLACEMENT;  Surgeon: Abbie Sons, MD;  Location: [facility];  Service: Urology;  Laterality: Left;  . ESOPHAGOGASTRODUODENOSCOPY  2000  . UMBILICAL HERNIA REPAIR  01/2004  . URETERAL STENT PLACEMENT  2/13   for L sided stone/ ecoli sepsis   Social History   Tobacco Use  . Smoking status: Never Smoker  . Smokeless tobacco: Never Used  Substance Use Topics  . Alcohol use: No    Alcohol/week: 0.0 standard drinks  . Drug use: No   Family History  Problem Relation Age of Onset  . Early death Father 57       train accident  . Diabetes Mother   . Heart disease Mother   . Diabetes Sister   . Diabetes Brother   . Colon cancer Brother   . Cancer Paternal Grandmother        unknown  . Diabetes Sister    Allergies  Allergen Reactions  . Rofecoxib     REACTION: reaction not known  . Tetanus-Diphtheria Toxoids Td     REACTION: sick with fever  . Antihistamines, Chlorpheniramine-Type Palpitations  . Sulfa Antibiotics Rash   Current Outpatient Medications on File Prior to Visit  Medication Sig Dispense Refill  . Ascorbic Acid (VITAMIN C) 500 MG tablet Take 500 mg by mouth daily.      . Aspirin-Caffeine (BC FAST PAIN RELIEF PO) Take 1 packet by mouth daily as needed.     . benazepril (LOTENSIN) 20 MG tablet Take 1 tablet  (20 mg total) by mouth daily. 90 tablet 3  . CALCIUM-VITAMIN D PO Take 1 tablet by mouth 2 (two) times daily.     . Cyanocobalamin (B-12 PO) Take 1 tablet by mouth daily.    . furosemide (LASIX) 40 MG tablet Take 0.5 tablets (20 mg total) by mouth daily. 45 tablet 3  . glipiZIDE (GLUCOTROL XL) 10 MG 24 hr tablet Take 1 tablet (10 mg total) by mouth daily with breakfast. 90 tablet 3  . glucose blood (BAYER CONTOUR TEST) test strip Use to check blood sugar once daily as directed for DM (Dx. E11.9) 100 each 0  . metFORMIN (GLUCOPHAGE) 1000 MG tablet Take 1 tablet (1,000 mg total) by mouth 2 (two) times daily with a meal. 180 tablet 3  . MICROLET LANCETS MISC Use to check blood sugar once daily as directed for DM (Dx. E11.9)  100 each 0  . Multiple Vitamin (MULTIVITAMIN) capsule Take 1 capsule by mouth daily.      . naproxen sodium (ALEVE) 220 MG tablet Take 220 mg by mouth daily as needed.    . Omega-3 Fatty Acids (FISH OIL PO) Take 1 capsule by mouth daily.     Marland Kitchen omeprazole (PRILOSEC) 20 MG capsule Take 1 capsule (20 mg total) by mouth daily. 90 capsule 3  . Pseudoephedrine HCl (SINUS & ALLERGY 12 HOUR PO) Take by mouth. Taking daily (combination product for allergy and sinus)    . simvastatin (ZOCOR) 40 MG tablet Take 1 tablet (40 mg total) by mouth at bedtime. 90 tablet 3  . triamcinolone cream (KENALOG) 0.1 % APPLY TO AFFECTED AREA(S)  TOPICALLY TWO TIMES A DAY 30 g 1  . vitamin E 400 UNIT capsule Take 400 Units by mouth daily.       No current facility-administered medications on file prior to visit.    Review of Systems  Constitutional: Negative for activity change, appetite change, fatigue, fever and unexpected weight change.  HENT: Negative for congestion, ear pain, rhinorrhea, sinus pressure and sore throat.   Eyes: Negative for pain, redness and visual disturbance.  Respiratory: Negative for cough, shortness of breath and wheezing.        ? Possibly more sob when swelling in ankles is  worse-she is not entirely sure  Cardiovascular: Positive for leg swelling. Negative for chest pain and palpitations.  Gastrointestinal: Negative for abdominal pain, blood in stool, constipation and diarrhea.  Endocrine: Negative for polydipsia and polyuria.  Genitourinary: Negative for dysuria, frequency and urgency.  Musculoskeletal: Negative for arthralgias, back pain and myalgias.  Skin: Negative for pallor, rash and wound.       No ulcers or wounds  Allergic/Immunologic: Negative for environmental allergies.  Neurological: Negative for dizziness, syncope and headaches.  Hematological: Negative for adenopathy. Does not bruise/bleed easily.  Psychiatric/Behavioral: Negative for decreased concentration and dysphoric mood. The patient is not nervous/anxious.        Objective:   Physical Exam Constitutional:      General: She is not in acute distress.    Appearance: Normal appearance. She is well-developed. She is obese. She is not ill-appearing or diaphoretic.  HENT:     Head: Normocephalic and atraumatic.  Eyes:     General: No scleral icterus.    Conjunctiva/sclera: Conjunctivae normal.     Pupils: Pupils are equal, round, and reactive to light.  Neck:     Thyroid: No thyromegaly.     Vascular: No carotid bruit or JVD.  Cardiovascular:     Rate and Rhythm: Normal rate and regular rhythm.     Heart sounds: Normal heart sounds. No gallop.   Pulmonary:     Effort: Pulmonary effort is normal. No respiratory distress.     Breath sounds: Normal breath sounds. No wheezing or rales.  Abdominal:     General: Bowel sounds are normal. There is no distension or abdominal bruit.     Palpations: Abdomen is soft. There is no mass.     Tenderness: There is no abdominal tenderness.  Musculoskeletal:     Cervical back: Normal range of motion and neck supple.     Right lower leg: Edema present.     Left lower leg: Edema present.     Comments: One plus pitting edema half way to knee  No  skin changes or tenderness    Lymphadenopathy:     Cervical: No cervical  adenopathy.  Skin:    General: Skin is warm and dry.     Coloration: Skin is not pale.     Findings: No erythema or rash.  Neurological:     Mental Status: She is alert.     Sensory: No sensory deficit.     Motor: No weakness.     Coordination: Coordination normal.     Deep Tendon Reflexes: Reflexes are normal and symmetric.  Psychiatric:        Mood and Affect: Mood normal.           Assessment & Plan:   Problem List Items Addressed This Visit      Cardiovascular and Mediastinum   Essential hypertension    BP: 132/90  More edema  Will inc lasix to 40 daily  F/u 2wk If no imp may need to make another change      DIASTOLIC HEART FAILURE, CHRONIC    Unsure if this is adding to edema  Some? Sob when more swollen (pt is unsure)  Last echo from 2011 looked ok  Check BNP today        Other   Pedal edema - Primary    Worse since November High sodium diet/does not wear supp hose/ not enough fluids/obese/often dependent legs and not much exercise  Also h/o DD   Lab today for bmp and BNP Inc lasix to 40 mg daily  Counseled on lifestyle change -extensively  F/u 2 wk  May consider echo if needed       Relevant Orders   Basic metabolic panel   Brain natriuretic peptide

## 2019-08-05 NOTE — Assessment & Plan Note (Signed)
BP: 132/90  More edema  Will inc lasix to 40 daily  F/u 2wk If no imp may need to make another change

## 2019-08-05 NOTE — Assessment & Plan Note (Signed)
Worse since November High sodium diet/does not wear supp hose/ not enough fluids/obese/often dependent legs and not much exercise  Also h/o DD   Lab today for bmp and BNP Inc lasix to 40 mg daily  Counseled on lifestyle change -extensively  F/u 2 wk  May consider echo if needed

## 2019-08-05 NOTE — Assessment & Plan Note (Signed)
Unsure if this is adding to edema  Some? Sob when more swollen (pt is unsure)  Last echo from 2011 looked ok  Check BNP today

## 2019-08-19 ENCOUNTER — Other Ambulatory Visit: Payer: Self-pay

## 2019-08-19 ENCOUNTER — Encounter: Payer: Self-pay | Admitting: Family Medicine

## 2019-08-19 ENCOUNTER — Ambulatory Visit (INDEPENDENT_AMBULATORY_CARE_PROVIDER_SITE_OTHER): Payer: Medicare Other | Admitting: Family Medicine

## 2019-08-19 VITALS — BP 128/78 | HR 86 | Temp 97.3°F | Ht 60.25 in | Wt 215.4 lb

## 2019-08-19 DIAGNOSIS — I10 Essential (primary) hypertension: Secondary | ICD-10-CM

## 2019-08-19 DIAGNOSIS — N3000 Acute cystitis without hematuria: Secondary | ICD-10-CM | POA: Diagnosis not present

## 2019-08-19 DIAGNOSIS — R300 Dysuria: Secondary | ICD-10-CM | POA: Diagnosis not present

## 2019-08-19 DIAGNOSIS — R600 Localized edema: Secondary | ICD-10-CM

## 2019-08-19 LAB — BASIC METABOLIC PANEL
BUN: 23 mg/dL (ref 6–23)
CO2: 27 mEq/L (ref 19–32)
Calcium: 9.8 mg/dL (ref 8.4–10.5)
Chloride: 106 mEq/L (ref 96–112)
Creatinine, Ser: 0.8 mg/dL (ref 0.40–1.20)
GFR: 68.54 mL/min (ref >60.00)
Glucose, Bld: 193 mg/dL — ABNORMAL HIGH (ref 70–99)
Potassium: 4.3 mEq/L (ref 3.5–5.1)
Sodium: 141 mEq/L (ref 135–145)

## 2019-08-19 LAB — POC URINALSYSI DIPSTICK (AUTOMATED)
Bilirubin, UA: NEGATIVE
Blood, UA: 100
Glucose, UA: NEGATIVE
Ketones, UA: NEGATIVE
Nitrite, UA: POSITIVE
Protein, UA: POSITIVE — AB
Spec Grav, UA: 1.025 (ref 1.010–1.025)
Urobilinogen, UA: 0.2 E.U./dL
pH, UA: 6 (ref 5.0–8.0)

## 2019-08-19 MED ORDER — CEPHALEXIN 500 MG PO CAPS
500.0000 mg | ORAL_CAPSULE | Freq: Two times a day (BID) | ORAL | 0 refills | Status: DC
Start: 2019-08-19 — End: 2019-09-03

## 2019-08-19 NOTE — Assessment & Plan Note (Signed)
Discussed how this problem influences overall health and the risks it imposes  Reviewed plan for weight loss with lower calorie diet (via better food choices and also portion control or program like weight watchers) and exercise building up to or more than 30 minutes 5 days per week including some aerobic activity   Wt loss would help edema

## 2019-08-19 NOTE — Assessment & Plan Note (Signed)
Notable improvement with inc to 40 mg of lasix daily  Pt wants to alt 20 and 40 depending on her symptoms Has felt a bit weak-will check bmp today and supplement K as needed Bmp nl last visit-re assuring

## 2019-08-19 NOTE — Assessment & Plan Note (Signed)
bp in fair control at this time  BP Readings from Last 1 Encounters:  08/19/19 128/78   No changes needed Most recent labs reviewed  Disc lifstyle change with low sodium diet and exercise  Bmp today in light of new lasix dose

## 2019-08-19 NOTE — Assessment & Plan Note (Signed)
tx with keflex cx pending  Handout given  inst to call if symptoms worsen Enc a good fluid intake

## 2019-08-19 NOTE — Patient Instructions (Addendum)
Drink water Take the keflex as directed for urine infection  We will call when culture returns  If symptoms worsen in the meantime let me know   Swelling is better I'm glad lasix helps  Let 's check potassium and other electrolytes today and go from there   Keep watching sodium  Stay active  Elevate feet when you sit

## 2019-08-19 NOTE — Progress Notes (Signed)
Subjective:    Patient ID: ANABELLA YEATS, female    DOB: [birth year], 85 y.o.   MRN: [id number]  This visit occurred during the SARS-CoV-2 public health emergency.  Safety protocols were in place, including screening questions prior to the visit, additional usage of staff PPE, and extensive cleaning of exam room while observing appropriate contact time as indicated for disinfecting solutions.    HPI Pt presents for f/u of chronic health problems  F/u for edema  Also has urinary symptoms   Wt Readings from Last 3 Encounters:  08/19/19 215 lb 6 oz (97.7 kg)  08/05/19 212 lb 5 oz (96.3 kg)  06/30/19 210 lb 8 oz (95.5 kg)   41.71 kg/m   Urinary symptoms Hurts to urinate/burns  Also bladder pain/pressure  Frequency and urgency - without a lot of volume  Incontinence  No blood in urine No nausea or fever   Trying to drink more water in general   UA; Results for orders placed or performed in visit on 08/19/19  POCT Urinalysis Dipstick (Automated)  Result Value Ref Range   Color, UA Yellow    Clarity, UA Cloudy    Glucose, UA Negative Negative   Bilirubin, UA Negative    Ketones, UA Negative    Spec Grav, UA 1.025 1.010 - 1.025   Blood, UA 100 Ery/uL    pH, UA 6.0 5.0 - 8.0   Protein, UA Positive (A) Negative   Urobilinogen, UA 0.2 0.2 or 1.0 E.U./dL   Nitrite, UA Positive    Leukocytes, UA Large (3+) (A) Negative      HTN bp is stable today  No cp or palpitations or headaches or edema  No side effects to medicines  BP Readings from Last 3 Encounters:  08/19/19 128/78  08/05/19 132/90  06/30/19 136/82    Last visit we inc lasix to 40 mg daily   Did not take for last 2 d - with her urinary symptoms  Has felt a little weak   Pedal edema had been worse since November  Inc lasix to 40 mg  Has helped a lot -ankles are almost normal  Diet --she has made an effort to eat much less sodium (stopped tortilla chips and hot dogs)  Exercise -no changes   (she goes up  and down stairs at least 5 times daily)  Has h/o diastaolic dysfunction   Lab Results  Component Value Date   CREATININE 0.83 08/05/2019   BUN 19 08/05/2019   NA 139 08/05/2019   K 3.9 08/05/2019   CL 104 08/05/2019   CO2 26 08/05/2019  GFR 65.7 BMP was 57 (normal)   Last urine cx in the chart (late dec) grew pan sens e coli and was tx by urology     Review of Systems     Objective:   Physical Exam        Assessment & Plan:   Problem List Items Addressed This Visit      Cardiovascular and Mediastinum   Essential hypertension    bp in fair control at this time  BP Readings from Last 1 Encounters:  08/19/19 128/78   No changes needed Most recent labs reviewed  Disc lifstyle change with low sodium diet and exercise  Bmp today in light of new lasix dose        Genitourinary   Acute cystitis    tx with keflex cx pending  Handout given  inst to call if symptoms worsen Enc a  good fluid intake      Relevant Orders   Urine Culture     Other   Morbid obesity ([place])    Discussed how this problem influences overall health and the risks it imposes  Reviewed plan for weight loss with lower calorie diet (via better food choices and also portion control or program like weight watchers) and exercise building up to or more than 30 minutes 5 days per week including some aerobic activity   Wt loss would help edema      Pedal edema - Primary    Notable improvement with inc to 40 mg of lasix daily  Pt wants to alt 20 and 40 depending on her symptoms Has felt a bit weak-will check bmp today and supplement K as needed Bmp nl last visit-re assuring       Relevant Orders   Basic metabolic panel    Other Visit Diagnoses    Dysuria       Relevant Orders   POCT Urinalysis Dipstick (Automated) (Completed)

## 2019-08-20 ENCOUNTER — Telehealth: Payer: Self-pay | Admitting: *Deleted

## 2019-08-20 LAB — URINE CULTURE
MICRO NUMBER:: 10517318
SPECIMEN QUALITY:: ADEQUATE

## 2019-08-20 NOTE — Telephone Encounter (Signed)
Pt called back, addressed results through result notes

## 2019-08-20 NOTE — Telephone Encounter (Signed)
Called pt twice to advise of lab results and phone disconnected me both times. No answer and couldn't leave a VM

## 2019-09-03 ENCOUNTER — Other Ambulatory Visit: Payer: Self-pay

## 2019-09-03 ENCOUNTER — Encounter: Payer: Self-pay | Admitting: Family Medicine

## 2019-09-03 ENCOUNTER — Ambulatory Visit (INDEPENDENT_AMBULATORY_CARE_PROVIDER_SITE_OTHER): Payer: Medicare Other | Admitting: Family Medicine

## 2019-09-03 VITALS — BP 134/78 | HR 83 | Temp 97.4°F | Ht 60.25 in | Wt 215.2 lb

## 2019-09-03 DIAGNOSIS — N390 Urinary tract infection, site not specified: Secondary | ICD-10-CM | POA: Diagnosis not present

## 2019-09-03 DIAGNOSIS — R300 Dysuria: Secondary | ICD-10-CM | POA: Diagnosis not present

## 2019-09-03 DIAGNOSIS — Z87442 Personal history of urinary calculi: Secondary | ICD-10-CM | POA: Diagnosis not present

## 2019-09-03 DIAGNOSIS — B9620 Unspecified Escherichia coli [E. coli] as the cause of diseases classified elsewhere: Secondary | ICD-10-CM | POA: Insufficient documentation

## 2019-09-03 DIAGNOSIS — N3000 Acute cystitis without hematuria: Secondary | ICD-10-CM | POA: Diagnosis not present

## 2019-09-03 LAB — POC URINALSYSI DIPSTICK (AUTOMATED)
Bilirubin, UA: NEGATIVE
Blood, UA: NEGATIVE
Glucose, UA: NEGATIVE
Nitrite, UA: POSITIVE
Protein, UA: POSITIVE — AB
Spec Grav, UA: 1.025 (ref 1.010–1.025)
Urobilinogen, UA: 0.2 E.U./dL
pH, UA: 6 (ref 5.0–8.0)

## 2019-09-03 MED ORDER — NITROFURANTOIN MONOHYD MACRO 100 MG PO CAPS
100.0000 mg | ORAL_CAPSULE | Freq: Two times a day (BID) | ORAL | 0 refills | Status: DC
Start: 2019-09-03 — End: 2019-09-08

## 2019-09-03 NOTE — Assessment & Plan Note (Signed)
In the setting of frequent utis  Enc more water intake  Px macrobid (last tx with keflex)  Pend cx  Ref done for urology

## 2019-09-03 NOTE — Patient Instructions (Addendum)
Please drink fluids to keep urine dilute   Take the macrobid as directed   Alert [place] if symptoms suddenly worsen  We will call you when culture returns   I placed a referral for new urologist

## 2019-09-03 NOTE — Assessment & Plan Note (Signed)
Pt prefers new urol practice  Has freq utis  occ flank pain Had stent in January Ref done

## 2019-09-03 NOTE — Progress Notes (Signed)
Subjective:    Patient ID: Gabriella Howe, female    DOB: 1934-07-21, 85 y.o.   MRN: [id number]  This visit occurred during the SARS-CoV-2 public health emergency.  Safety protocols were in place, including screening questions prior to the visit, additional usage of staff PPE, and extensive cleaning of exam room while observing appropriate contact time as indicated for disinfecting solutions.    HPI Pt presents for urinary symptoms   Wt Readings from Last 3 Encounters:  09/03/19 215 lb 3 oz (97.6 kg)  08/19/19 215 lb 6 oz (97.7 kg)  08/05/19 212 lb 5 oz (96.3 kg)   41.68 kg/m  She has hx of utis and also kidney stones with a stent in the past   Recently had keflex for uti  Ended thurs Started symptoms on sat   Has some L flank pain - this may be muscular (not severe like stone) Burning  Pressure in bladder  No blood in urine  Urine looks clear  A little odor   She tries to drink more water    ? If stone is an issue  Stream varies -sometimes not strong  Urgency and incontinence   Used to see Dr Bernardo Heater  If she needs to f/u she wants to go somewhere else  Due for re check after stent removal  Per pt-broke kidney stones with laser  She has not passed anything   Pos ua today Results for orders placed or performed in visit on 09/03/19  POCT Urinalysis Dipstick (Automated)  Result Value Ref Range   Color, UA Yellow    Clarity, UA Hazy    Glucose, UA Negative Negative   Bilirubin, UA Negative    Ketones, UA Trace    Spec Grav, UA 1.025 1.010 - 1.025   Blood, UA Negative    pH, UA 6.0 5.0 - 8.0   Protein, UA Positive (A) Negative   Urobilinogen, UA 0.2 0.2 or 1.0 E.U./dL   Nitrite, UA Positive    Leukocytes, UA Moderate (2+) (A) Negative    Not as dilute as we want it to be    Allergic to sulfa abx   Patient Active Problem List   Diagnosis Date Noted  . Frequent UTI 09/03/2019  . Acute cystitis 08/19/2019  . Pedal edema 08/05/2019  . DJD (degenerative  joint disease) 02/26/2019  . Iron deficiency anemia 04/30/2017  . History of shingles 10/03/2016  . Routine general medical examination at a health care facility 04/14/2015  . Back pain 07/28/2014  . Knee pain 07/28/2014  . Incomplete emptying of bladder 11/28/2012  . Increased frequency of urination 11/28/2012  . Encounter for Medicare annual wellness exam 11/19/2012  . History of kidney stones 11/19/2012  . DIASTOLIC HEART FAILURE, CHRONIC 07/29/2009  . Hyperlipidemia associated with type 2 diabetes mellitus ([place]) 09/13/2006  . Morbid obesity ([place]) 09/10/2006  . CARPAL TUNNEL SYNDROME 09/10/2006  . Essential hypertension 09/10/2006  . DIASTOLIC DYSFUNCTION 17/40/8144  . REACTIVE AIRWAY DISEASE 09/10/2006  . GERD 09/10/2006  . ROSACEA 09/10/2006  . Degeneration of lumbar or lumbosacral intervertebral disc 09/10/2006  . OSA (obstructive sleep apnea) 09/10/2006  . COLONIC POLYPS, HX OF 09/10/2006  . Diabetes type 2, controlled ([place]) 09/06/2006   Past Medical History:  Diagnosis Date  . Adenomatous colon polyp 2003  . Benign neoplasm of colon   . Carpal tunnel syndrome   . Chest pain, unspecified   . CHF (congestive heart failure) (HCC)    diastolic  . Degeneration  of intervertebral disc, site unspecified   . Diabetes mellitus    type II  . Encounter for long-term (current) use of other medications   . Fluid overload   . GERD (gastroesophageal reflux disease)   . Heart disease, unspecified   . Hyperlipidemia   . Hypertension   . Kidney stone 3/12   hosp/ with stent  . Obesity, unspecified   . Rosacea   . Shortness of breath   . Unspecified asthma(493.90)   . Unspecified sleep apnea    Past Surgical History:  Procedure Laterality Date  . ABDOMINAL HYSTERECTOMY     partial, fibroids  . CYSTOSCOPY WITH STENT PLACEMENT Left 02/18/2019   Procedure: CYSTOSCOPY WITH STENT PLACEMENT;  Surgeon: Abbie Sons, MD;  Location: [facility];  Service: Urology;  Laterality: Left;   . CYSTOSCOPY/URETEROSCOPY/HOLMIUM LASER/STENT PLACEMENT Left 04/01/2019   Procedure: CYSTOSCOPY/URETEROSCOPY/HOLMIUM LASER/STENT PLACEMENT;  Surgeon: Abbie Sons, MD;  Location: [facility];  Service: Urology;  Laterality: Left;  . ESOPHAGOGASTRODUODENOSCOPY  2000  . UMBILICAL HERNIA REPAIR  01/2004  . URETERAL STENT PLACEMENT  2/13   for L sided stone/ ecoli sepsis   Social History   Tobacco Use  . Smoking status: Never Smoker  . Smokeless tobacco: Never Used  Substance Use Topics  . Alcohol use: No    Alcohol/week: 0.0 standard drinks  . Drug use: No   Family History  Problem Relation Age of Onset  . Early death Father 73       train accident  . Diabetes Mother   . Heart disease Mother   . Diabetes Sister   . Diabetes Brother   . Colon cancer Brother   . Cancer Paternal Grandmother        unknown  . Diabetes Sister    Allergies  Allergen Reactions  . Rofecoxib     REACTION: reaction not known  . Tetanus-Diphtheria Toxoids Td     REACTION: sick with fever  . Antihistamines, Chlorpheniramine-Type Palpitations  . Sulfa Antibiotics Rash   Current Outpatient Medications on File Prior to Visit  Medication Sig Dispense Refill  . Ascorbic Acid (VITAMIN C) 500 MG tablet Take 500 mg by mouth daily.      . Aspirin-Caffeine (BC FAST PAIN RELIEF PO) Take 1 packet by mouth daily as needed.     . benazepril (LOTENSIN) 20 MG tablet Take 1 tablet (20 mg total) by mouth daily. 90 tablet 3  . CALCIUM-VITAMIN D PO Take 1 tablet by mouth 2 (two) times daily.     . Cyanocobalamin (B-12 PO) Take 1 tablet by mouth daily.    . furosemide (LASIX) 40 MG tablet Take 1 tablet (40 mg total) by mouth daily. 1 tablet 0  . glipiZIDE (GLUCOTROL XL) 10 MG 24 hr tablet Take 1 tablet (10 mg total) by mouth daily with breakfast. 90 tablet 3  . glucose blood (BAYER CONTOUR TEST) test strip Use to check blood sugar once daily as directed for DM (Dx. E11.9) 100 each 0  . metFORMIN (GLUCOPHAGE) 1000 MG  tablet Take 1 tablet (1,000 mg total) by mouth 2 (two) times daily with a meal. 180 tablet 3  . MICROLET LANCETS MISC Use to check blood sugar once daily as directed for DM (Dx. E11.9) 100 each 0  . Multiple Vitamin (MULTIVITAMIN) capsule Take 1 capsule by mouth daily.      . naproxen sodium (ALEVE) 220 MG tablet Take 220 mg by mouth daily as needed.    . Omega-3 Fatty  Acids (FISH OIL PO) Take 1 capsule by mouth daily.     Marland Kitchen omeprazole (PRILOSEC) 20 MG capsule Take 1 capsule (20 mg total) by mouth daily. 90 capsule 3  . Pseudoephedrine HCl (SINUS & ALLERGY 12 HOUR PO) Take by mouth. Taking daily (combination product for allergy and sinus)    . simvastatin (ZOCOR) 40 MG tablet Take 1 tablet (40 mg total) by mouth at bedtime. 90 tablet 3  . triamcinolone cream (KENALOG) 0.1 % APPLY TO AFFECTED AREA(S)  TOPICALLY TWO TIMES A DAY 30 g 1  . vitamin E 400 UNIT capsule Take 400 Units by mouth daily.       No current facility-administered medications on file prior to visit.     Review of Systems  Constitutional: Positive for fatigue. Negative for activity change, appetite change and fever.  HENT: Negative for congestion and sore throat.   Eyes: Negative for itching and visual disturbance.  Respiratory: Negative for cough and shortness of breath.   Cardiovascular: Negative for leg swelling.  Gastrointestinal: Negative for abdominal distention, abdominal pain, constipation, diarrhea and nausea.  Endocrine: Negative for cold intolerance and polydipsia.  Genitourinary: Positive for dysuria, frequency and urgency. Negative for difficulty urinating, flank pain and hematuria.  Musculoskeletal: Negative for myalgias.  Skin: Negative for rash.  Allergic/Immunologic: Negative for immunocompromised state.  Neurological: Negative for dizziness and weakness.  Hematological: Negative for adenopathy.       Objective:   Physical Exam Constitutional:      General: She is not in acute distress.     Appearance: She is well-developed. She is obese. She is not ill-appearing or diaphoretic.  HENT:     Head: Normocephalic and atraumatic.  Eyes:     Conjunctiva/sclera: Conjunctivae normal.     Pupils: Pupils are equal, round, and reactive to light.  Cardiovascular:     Rate and Rhythm: Normal rate and regular rhythm.     Heart sounds: Normal heart sounds.  Pulmonary:     Effort: Pulmonary effort is normal.     Breath sounds: Normal breath sounds.  Abdominal:     General: Bowel sounds are normal. There is no distension.     Palpations: Abdomen is soft.     Tenderness: There is no abdominal tenderness. There is no rebound.     Comments: No cva tenderness  No suprapubic tenderness/per pt palpation causes need to urinate  Musculoskeletal:     Cervical back: Normal range of motion and neck supple.  Lymphadenopathy:     Cervical: No cervical adenopathy.  Skin:    Coloration: Skin is not pale.     Findings: No erythema or rash.  Neurological:     Mental Status: She is alert.  Psychiatric:        Mood and Affect: Mood normal.           Assessment & Plan:   Problem List Items Addressed This Visit      Genitourinary   Acute cystitis    In the setting of frequent utis  Enc more water intake  Px macrobid (last tx with keflex)  Pend cx  Ref done for urology      Relevant Orders   Urine Culture   Ambulatory referral to Urology   Frequent UTI    With hx of stones/stent and hydronephrosis in the past Prefers a new urology practice  Ref done  tx current uti with macrobid, cx pending      Relevant Medications   nitrofurantoin, macrocrystal-monohydrate, (  MACROBID) 100 MG capsule   Other Relevant Orders   Ambulatory referral to Urology     Other   History of kidney stones - Primary    Pt prefers new urol practice  Has freq utis  occ flank pain Had stent in January Ref done      Relevant Orders   Ambulatory referral to Urology    Other Visit Diagnoses     Dysuria       Relevant Orders   POCT Urinalysis Dipstick (Automated) (Completed)

## 2019-09-03 NOTE — Assessment & Plan Note (Signed)
With hx of stones/stent and hydronephrosis in the past Prefers a new urology practice  Ref done  tx current uti with macrobid, cx pending

## 2019-09-05 LAB — URINE CULTURE
MICRO NUMBER:: 10571297
SPECIMEN QUALITY:: ADEQUATE

## 2019-09-07 ENCOUNTER — Other Ambulatory Visit: Payer: Self-pay | Admitting: Family Medicine

## 2019-09-07 NOTE — Telephone Encounter (Signed)
uti is resistant to macrobid so I need to change her antibiotic from that to keflex  Px pended to pharmacy of choice

## 2019-09-08 MED ORDER — CEPHALEXIN 500 MG PO CAPS
500.0000 mg | ORAL_CAPSULE | Freq: Two times a day (BID) | ORAL | 0 refills | Status: DC
Start: 2019-09-08 — End: 2019-09-22

## 2019-09-08 NOTE — Telephone Encounter (Signed)
Rx sent to pharmacy and pt notified of urine cx results and Dr. Marliss Coots comments

## 2019-09-22 ENCOUNTER — Encounter: Payer: Self-pay | Admitting: Primary Care

## 2019-09-22 ENCOUNTER — Ambulatory Visit (INDEPENDENT_AMBULATORY_CARE_PROVIDER_SITE_OTHER): Payer: Medicare Other | Admitting: Primary Care

## 2019-09-22 ENCOUNTER — Other Ambulatory Visit: Payer: Self-pay

## 2019-09-22 VITALS — BP 124/78 | HR 77 | Ht 60.25 in | Wt 214.0 lb

## 2019-09-22 DIAGNOSIS — R350 Frequency of micturition: Secondary | ICD-10-CM | POA: Diagnosis not present

## 2019-09-22 LAB — POCT URINALYSIS DIPSTICK
Bilirubin, UA: NEGATIVE
Blood, UA: NEGATIVE
Glucose, UA: NEGATIVE
Ketones, UA: NEGATIVE
Nitrite, UA: NEGATIVE
Protein, UA: NEGATIVE
Spec Grav, UA: 1.015 (ref 1.010–1.025)
Urobilinogen, UA: 0.2 E.U./dL
pH, UA: 6 (ref 5.0–8.0)

## 2019-09-22 NOTE — Patient Instructions (Signed)
We will be in touch once we receive your urine culture report.   You can try AZO as needed for pressure and burning.  Be sure to drink plenty of water daily. Avoid caffeine as it will cause irritation.   It was a pleasure meeting you!

## 2019-09-22 NOTE — Progress Notes (Signed)
Subjective:    Patient ID: Gabriella Howe, female    DOB: 08/25/1934, 85 y.o.   MRN: [id number]  HPI  This visit occurred during the SARS-CoV-2 public health emergency.  Safety protocols were in place, including screening questions prior to the visit, additional usage of staff PPE, and extensive cleaning of exam room while observing appropriate contact time as indicated for disinfecting solutions.   Gabriella Howe is a 85 year old female patient of Dr. Glori Bickers with a history of recurrent UTI, type 2 diabetes, renal stones, incomplete bladder emptying who presents today with a chief complaint of urinary frequency.  Initially treated for acute cystitis with cephalexin course, urine culture negative, completed her regimen on 08/28/19. Evaluated by her PCP on 09/03/19 for left flank pain, bladder pressure, some foul smelling urine. Treated with Macrobid, urine culture positive with Proteus mirabilis, resistance to Macrobid. She was then switched to cephalexin 500 mg BID x 7 days.   Today she endorses symptoms of urinary frequency, inability to hold her urine, bladder pressure, dysuria, cloudy appearing urine that began two days ago. She completed her cephalexin course three days ago. She has an appointment with her new Urologist in mid July 2021.  She denies vaginal itching, vaginal discharge, increased back pain, fevers, abdominal pain.   Review of Systems  Constitutional: Negative for fever.  Gastrointestinal: Negative for abdominal pain and nausea.  Genitourinary: Positive for dysuria, frequency and pelvic pain. Negative for flank pain, hematuria and vaginal discharge.       Past Medical History:  Diagnosis Date  . Adenomatous colon polyp 2003  . Benign neoplasm of colon   . Carpal tunnel syndrome   . Chest pain, unspecified   . CHF (congestive heart failure) (HCC)    diastolic  . Degeneration of intervertebral disc, site unspecified   . Diabetes mellitus    type II  . Encounter for  long-term (current) use of other medications   . Fluid overload   . GERD (gastroesophageal reflux disease)   . Heart disease, unspecified   . Hyperlipidemia   . Hypertension   . Kidney stone 3/12   hosp/ with stent  . Obesity, unspecified   . Rosacea   . Shortness of breath   . Unspecified asthma(493.90)   . Unspecified sleep apnea      Social History   Socioeconomic History  . Marital status: Widowed    Spouse name: Not on file  . Number of children: Not on file  . Years of education: Not on file  . Highest education level: Not on file  Occupational History  . Not on file  Tobacco Use  . Smoking status: Never Smoker  . Smokeless tobacco: Never Used  Vaping Use  . Vaping Use: Never used  Substance and Sexual Activity  . Alcohol use: No    Alcohol/week: 0.0 standard drinks  . Drug use: No  . Sexual activity: Never  Other Topics Concern  . Not on file  Social History Narrative  . Not on file   Social Determinants of Health   Financial Resource Strain: Low Risk   . Difficulty of Paying Living Expenses: Not hard at all  Food Insecurity: No Food Insecurity  . Worried About Charity fundraiser in the Last Year: Never true  . Ran Out of Food in the Last Year: Never true  Transportation Needs: No Transportation Needs  . Lack of Transportation (Medical): No  . Lack of Transportation (Non-Medical): No  Physical Activity:  Inactive  . Days of Exercise per Week: 0 days  . Minutes of Exercise per Session: 0 min  Stress: No Stress Concern Present  . Feeling of Stress : Not at all  Social Connections:   . Frequency of Communication with Friends and Family:   . Frequency of Social Gatherings with Friends and Family:   . Attends Religious Services:   . Active Member of Clubs or Organizations:   . Attends Archivist Meetings:   Marland Kitchen Marital Status:   Intimate Partner Violence: Not At Risk  . Fear of Current or Ex-Partner: No  . Emotionally Abused: No  . Physically  Abused: No  . Sexually Abused: No    Past Surgical History:  Procedure Laterality Date  . ABDOMINAL HYSTERECTOMY     partial, fibroids  . CYSTOSCOPY WITH STENT PLACEMENT Left 02/18/2019   Procedure: CYSTOSCOPY WITH STENT PLACEMENT;  Surgeon: Abbie Sons, MD;  Location: [facility];  Service: Urology;  Laterality: Left;  . CYSTOSCOPY/URETEROSCOPY/HOLMIUM LASER/STENT PLACEMENT Left 04/01/2019   Procedure: CYSTOSCOPY/URETEROSCOPY/HOLMIUM LASER/STENT PLACEMENT;  Surgeon: Abbie Sons, MD;  Location: [facility];  Service: Urology;  Laterality: Left;  . ESOPHAGOGASTRODUODENOSCOPY  2000  . UMBILICAL HERNIA REPAIR  01/2004  . URETERAL STENT PLACEMENT  2/13   for L sided stone/ ecoli sepsis    Family History  Problem Relation Age of Onset  . Early death Father 21       train accident  . Diabetes Mother   . Heart disease Mother   . Diabetes Sister   . Diabetes Brother   . Colon cancer Brother   . Cancer Paternal Grandmother        unknown  . Diabetes Sister     Allergies  Allergen Reactions  . Rofecoxib     REACTION: reaction not known  . Tetanus-Diphtheria Toxoids Td     REACTION: sick with fever  . Antihistamines, Chlorpheniramine-Type Palpitations  . Sulfa Antibiotics Rash    Current Outpatient Medications on File Prior to Visit  Medication Sig Dispense Refill  . Ascorbic Acid (VITAMIN C) 500 MG tablet Take 500 mg by mouth daily.      . Aspirin-Caffeine (BC FAST PAIN RELIEF PO) Take 1 packet by mouth daily as needed.     . benazepril (LOTENSIN) 20 MG tablet Take 1 tablet (20 mg total) by mouth daily. 90 tablet 3  . CALCIUM-VITAMIN D PO Take 1 tablet by mouth 2 (two) times daily.     . Cyanocobalamin (B-12 PO) Take 1 tablet by mouth daily.    . furosemide (LASIX) 40 MG tablet Take 1 tablet (40 mg total) by mouth daily. 1 tablet 0  . glipiZIDE (GLUCOTROL XL) 10 MG 24 hr tablet Take 1 tablet (10 mg total) by mouth daily with breakfast. 90 tablet 3  . glucose blood (BAYER  CONTOUR TEST) test strip Use to check blood sugar once daily as directed for DM (Dx. E11.9) 100 each 0  . metFORMIN (GLUCOPHAGE) 1000 MG tablet Take 1 tablet (1,000 mg total) by mouth 2 (two) times daily with a meal. 180 tablet 3  . MICROLET LANCETS MISC Use to check blood sugar once daily as directed for DM (Dx. E11.9) 100 each 0  . Multiple Vitamin (MULTIVITAMIN) capsule Take 1 capsule by mouth daily.      . naproxen sodium (ALEVE) 220 MG tablet Take 220 mg by mouth daily as needed.    . Omega-3 Fatty Acids (FISH OIL PO) Take 1 capsule by  mouth daily.     Marland Kitchen omeprazole (PRILOSEC) 20 MG capsule Take 1 capsule (20 mg total) by mouth daily. 90 capsule 3  . Pseudoephedrine HCl (SINUS & ALLERGY 12 HOUR PO) Take by mouth. Taking daily (combination product for allergy and sinus)    . simvastatin (ZOCOR) 40 MG tablet Take 1 tablet (40 mg total) by mouth at bedtime. 90 tablet 3  . triamcinolone cream (KENALOG) 0.1 % APPLY TO AFFECTED AREA(S)  TOPICALLY TWO TIMES A DAY 30 g 1  . vitamin E 400 UNIT capsule Take 400 Units by mouth daily.       No current facility-administered medications on file prior to visit.    BP 124/78   Pulse 77   Ht 5' 0.25" (1.53 m)   Wt 214 lb (97.1 kg)   SpO2 93%   BMI 41.45 kg/m    Objective:   Physical Exam Cardiovascular:     Rate and Rhythm: Normal rate and regular rhythm.  Pulmonary:     Effort: Pulmonary effort is normal.  Abdominal:     Tenderness: There is no right CVA tenderness or left CVA tenderness.  Neurological:     Mental Status: She is alert and oriented to person, place, and time.  Psychiatric:        Mood and Affect: Mood normal.            Assessment & Plan:

## 2019-09-22 NOTE — Assessment & Plan Note (Signed)
Acute for the last 2 days, completed second antibiotic regimen 3 days ago.  History of recurrent cystitis, to be evaluated by urology in mid July 2021.  Exam today stable.  UA today with 3+ leuks, otherwise negative.  Given history of recurrent cystitis, coupled with stable exam, recent recurrent antibiotic use, and history of resistance, will await urine culture results for treatment.  Culture pending.  She is stable for outpatient treatment.

## 2019-09-23 LAB — URINE CULTURE
MICRO NUMBER:: 10641961
SPECIMEN QUALITY:: ADEQUATE

## 2019-09-26 ENCOUNTER — Other Ambulatory Visit (INDEPENDENT_AMBULATORY_CARE_PROVIDER_SITE_OTHER): Payer: Medicare Other

## 2019-09-26 ENCOUNTER — Other Ambulatory Visit: Payer: Self-pay

## 2019-09-26 ENCOUNTER — Telehealth: Payer: Self-pay | Admitting: *Deleted

## 2019-09-26 DIAGNOSIS — R300 Dysuria: Secondary | ICD-10-CM

## 2019-09-26 LAB — POC URINALSYSI DIPSTICK (AUTOMATED)
Bilirubin, UA: NEGATIVE
Glucose, UA: NEGATIVE
Ketones, UA: NEGATIVE
Nitrite, UA: NEGATIVE
Protein, UA: POSITIVE — AB
Spec Grav, UA: 1.025 (ref 1.010–1.025)
Urobilinogen, UA: 0.2 E.U./dL
pH, UA: 6 (ref 5.0–8.0)

## 2019-09-26 MED ORDER — CEPHALEXIN 250 MG PO CAPS: 250.0000 mg | ORAL_CAPSULE | Freq: Two times a day (BID) | ORAL | 0 refills | Status: DC

## 2019-09-26 NOTE — Telephone Encounter (Signed)
Please have pt drop off a urine sample so we can look at it this afternoon.    There are some conditions that do cause urinary symptoms w/o infection but I do want to re check please

## 2019-09-26 NOTE — Telephone Encounter (Signed)
Patient called stating that she knows that she has a UTI. See office notes of 09/22/19. Patient stated that she is having pain, pressure, urine urgency and frequency. Patient stated that she is being referred to a new urologist, but can't be seen  for a couple of weeks. Patient stated that she has had a lot of UTI's and knows how they make her feel. Patient wants to know if Dr. Glori Bickers will send an antibiotic in for her since it's Friday and unable to get in to be seen today. Patient stated that she will be glad to schedule another appointment to be seen next week, but needs an antibiotic now. Pharmacy [facility]

## 2019-09-26 NOTE — Telephone Encounter (Signed)
Pos ua  Thanks for ordering culture   I sent keflex to [facility]  Please keep [place] posted

## 2019-09-26 NOTE — Telephone Encounter (Signed)
Spoke to patient and lab appointment scheduled for patient to come to the office to give a urine specimen per Dr. Glori Bickers. Patient stated that she should be at the office by 3:30.

## 2019-09-26 NOTE — Telephone Encounter (Signed)
Spoke to pt

## 2019-09-28 LAB — URINE CULTURE
MICRO NUMBER:: 10663285
SPECIMEN QUALITY:: ADEQUATE

## 2019-10-01 ENCOUNTER — Telehealth: Payer: Self-pay | Admitting: Family Medicine

## 2019-10-01 DIAGNOSIS — E119 Type 2 diabetes mellitus without complications: Secondary | ICD-10-CM

## 2019-10-01 DIAGNOSIS — I10 Essential (primary) hypertension: Secondary | ICD-10-CM

## 2019-10-01 DIAGNOSIS — E785 Hyperlipidemia, unspecified: Secondary | ICD-10-CM

## 2019-10-01 NOTE — Telephone Encounter (Signed)
-----   Message from Ellamae Sia sent at 09/17/2019  2:51 PM EDT ----- Regarding: Lab orders for Thursday, 7.8.21 Lab orders for a 3 month follow up appt.

## 2019-10-02 ENCOUNTER — Other Ambulatory Visit: Payer: Self-pay

## 2019-10-02 ENCOUNTER — Other Ambulatory Visit (INDEPENDENT_AMBULATORY_CARE_PROVIDER_SITE_OTHER): Payer: Medicare Other

## 2019-10-02 DIAGNOSIS — E119 Type 2 diabetes mellitus without complications: Secondary | ICD-10-CM

## 2019-10-02 DIAGNOSIS — I10 Essential (primary) hypertension: Secondary | ICD-10-CM

## 2019-10-02 DIAGNOSIS — E1169 Type 2 diabetes mellitus with other specified complication: Secondary | ICD-10-CM | POA: Diagnosis not present

## 2019-10-02 DIAGNOSIS — E785 Hyperlipidemia, unspecified: Secondary | ICD-10-CM | POA: Diagnosis not present

## 2019-10-02 LAB — HEMOGLOBIN A1C: Hgb A1c MFr Bld: 7.4 % — ABNORMAL HIGH (ref 4.6–6.5)

## 2019-10-02 LAB — COMPREHENSIVE METABOLIC PANEL
ALT: 15 U/L (ref 0–35)
AST: 14 U/L (ref 0–37)
Albumin: 4.2 g/dL (ref 3.5–5.2)
Alkaline Phosphatase: 62 U/L (ref 39–117)
BUN: 25 mg/dL — ABNORMAL HIGH (ref 6–23)
CO2: 26 mEq/L (ref 19–32)
Calcium: 9.6 mg/dL (ref 8.4–10.5)
Chloride: 106 mEq/L (ref 96–112)
Creatinine, Ser: 0.92 mg/dL (ref 0.40–1.20)
GFR: 58.31 mL/min — ABNORMAL LOW (ref >60.00)
Glucose, Bld: 158 mg/dL — ABNORMAL HIGH (ref 70–99)
Potassium: 3.9 mEq/L (ref 3.5–5.1)
Sodium: 141 mEq/L (ref 135–145)
Total Bilirubin: 1 mg/dL (ref 0.2–1.2)
Total Protein: 6.7 g/dL (ref 6.0–8.3)

## 2019-10-02 LAB — HM DIABETES EYE EXAM

## 2019-10-02 LAB — LIPID PANEL
Cholesterol: 117 mg/dL (ref 0–200)
HDL: 50.7 mg/dL (ref >39.00)
LDL Cholesterol: 45 mg/dL (ref 0–99)
NonHDL: 66.05
Total CHOL/HDL Ratio: 2
Triglycerides: 107 mg/dL (ref 0.0–149.0)
VLDL: 21.4 mg/dL (ref 0.0–40.0)

## 2019-10-08 ENCOUNTER — Encounter: Payer: Self-pay | Admitting: Family Medicine

## 2019-10-09 DIAGNOSIS — R1084 Generalized abdominal pain: Secondary | ICD-10-CM | POA: Diagnosis not present

## 2019-10-09 DIAGNOSIS — N3020 Other chronic cystitis without hematuria: Secondary | ICD-10-CM | POA: Diagnosis not present

## 2019-10-09 DIAGNOSIS — N200 Calculus of kidney: Secondary | ICD-10-CM | POA: Diagnosis not present

## 2019-10-17 ENCOUNTER — Telehealth: Payer: Self-pay | Admitting: Family Medicine

## 2019-10-17 ENCOUNTER — Encounter: Payer: Self-pay | Admitting: Family Medicine

## 2019-10-17 ENCOUNTER — Other Ambulatory Visit: Payer: Self-pay

## 2019-10-17 ENCOUNTER — Ambulatory Visit (INDEPENDENT_AMBULATORY_CARE_PROVIDER_SITE_OTHER): Payer: Medicare Other | Admitting: Family Medicine

## 2019-10-17 VITALS — BP 130/77 | HR 75 | Temp 97.3°F | Ht 60.25 in | Wt 212.5 lb

## 2019-10-17 DIAGNOSIS — E785 Hyperlipidemia, unspecified: Secondary | ICD-10-CM

## 2019-10-17 DIAGNOSIS — E1169 Type 2 diabetes mellitus with other specified complication: Secondary | ICD-10-CM | POA: Diagnosis not present

## 2019-10-17 DIAGNOSIS — E119 Type 2 diabetes mellitus without complications: Secondary | ICD-10-CM

## 2019-10-17 DIAGNOSIS — I10 Essential (primary) hypertension: Secondary | ICD-10-CM | POA: Diagnosis not present

## 2019-10-17 NOTE — Progress Notes (Signed)
Subjective:    Patient ID: Gabriella Howe, female    DOB: July 16, 1934, 85 y.o.   MRN: [id number]  This visit occurred during the SARS-CoV-2 public health emergency.  Safety protocols were in place, including screening questions prior to the visit, additional usage of staff PPE, and extensive cleaning of exam room while observing appropriate contact time as indicated for disinfecting solutions.    HPI Pt presents for f/u of chronic medical problems   Wt Readings from Last 3 Encounters:  10/17/19 (!) 212 lb 8 oz (96.4 kg)  09/22/19 214 lb (97.1 kg)  09/03/19 215 lb 3 oz (97.6 kg)  weight is down 2 lb  41.16 kg/m   HTN bp is stable today  No cp or palpitations or headaches or edema  No side effects to medicines  BP Readings from Last 3 Encounters:  10/17/19 (!) 142/70  09/22/19 124/78  09/03/19 134/78     2nd check BP: (!) 130/77     Ankles are coming down-not quite as swollen  Taking lasix 40 mg  Does not check at home   Lab Results  Component Value Date   CREATININE 0.92 10/02/2019   BUN 25 (H) 10/02/2019   NA 141 10/02/2019   K 3.9 10/02/2019   CL 106 10/02/2019   CO2 26 10/02/2019    Battling another uti right now - started it 2 d ago  Symptoms are improved  Seeing urologist  Taking ? Azithromycin (not sure)  Seeing Dr Gloriann Loan    DM2 Lab Results  Component Value Date   HGBA1C 7.4 (H) 10/02/2019   That is down slt from 7.6  Trying to work on her diet  Last visit we inc glipizide xl to 10 mg  Taking ace and statin     Has not cut out sweets totally but she did really cut back  Also on the salt  She eats sweets - Toast with jam at bedtime   -instead  occ has 6 vanilla wafer with banana -every day (habit)  Thinks she could give up sweets  Does not drink any sugar beverages   Pretty limited in terms of exercise  Is active-has to use steps a lot  Uses a cane  If she stands up for any length of time- it hurts her back      Hyperlipidemia Lab  Results  Component Value Date   CHOL 117 10/02/2019   CHOL 141 05/08/2019   CHOL 123 05/06/2018   Lab Results  Component Value Date   HDL 50.70 10/02/2019   HDL 59.60 05/08/2019   HDL 50.00 05/06/2018   Lab Results  Component Value Date   LDLCALC 45 10/02/2019   LDLCALC 56 05/08/2019   LDLCALC 50 05/06/2018   Lab Results  Component Value Date   TRIG 107.0 10/02/2019   TRIG 128.0 05/08/2019   TRIG 115.0 05/06/2018   Lab Results  Component Value Date   CHOLHDL 2 10/02/2019   CHOLHDL 2 05/08/2019   CHOLHDL 2 05/06/2018   No results found for: LDLDIRECT  Simvastatin and diet   Patient Active Problem List   Diagnosis Date Noted  . Frequent UTI 09/03/2019  . Acute cystitis 08/19/2019  . Pedal edema 08/05/2019  . DJD (degenerative joint disease) 02/26/2019  . Iron deficiency anemia 04/30/2017  . History of shingles 10/03/2016  . Routine general medical examination at a health care facility 04/14/2015  . Back pain 07/28/2014  . Knee pain 07/28/2014  . Incomplete emptying of  bladder 11/28/2012  . Frequency of urination 11/28/2012  . Encounter for Medicare annual wellness exam 11/19/2012  . History of kidney stones 11/19/2012  . DIASTOLIC HEART FAILURE, CHRONIC 07/29/2009  . Hyperlipidemia associated with type 2 diabetes mellitus ([place]) 09/13/2006  . Morbid obesity ([place]) 09/10/2006  . CARPAL TUNNEL SYNDROME 09/10/2006  . Essential hypertension 09/10/2006  . DIASTOLIC DYSFUNCTION 29/79/8921  . REACTIVE AIRWAY DISEASE 09/10/2006  . GERD 09/10/2006  . ROSACEA 09/10/2006  . Degeneration of lumbar or lumbosacral intervertebral disc 09/10/2006  . OSA (obstructive sleep apnea) 09/10/2006  . COLONIC POLYPS, HX OF 09/10/2006  . Diabetes type 2, controlled ([place]) 09/06/2006   Past Medical History:  Diagnosis Date  . Adenomatous colon polyp 2003  . Benign neoplasm of colon   . Carpal tunnel syndrome   . Chest pain, unspecified   . CHF (congestive heart failure) (HCC)     diastolic  . Degeneration of intervertebral disc, site unspecified   . Diabetes mellitus    type II  . Encounter for long-term (current) use of other medications   . Fluid overload   . GERD (gastroesophageal reflux disease)   . Heart disease, unspecified   . Hyperlipidemia   . Hypertension   . Kidney stone 3/12   hosp/ with stent  . Obesity, unspecified   . Rosacea   . Shortness of breath   . Unspecified asthma(493.90)   . Unspecified sleep apnea    Past Surgical History:  Procedure Laterality Date  . ABDOMINAL HYSTERECTOMY     partial, fibroids  . CYSTOSCOPY WITH STENT PLACEMENT Left 02/18/2019   Procedure: CYSTOSCOPY WITH STENT PLACEMENT;  Surgeon: Abbie Sons, MD;  Location: [facility];  Service: Urology;  Laterality: Left;  . CYSTOSCOPY/URETEROSCOPY/HOLMIUM LASER/STENT PLACEMENT Left 04/01/2019   Procedure: CYSTOSCOPY/URETEROSCOPY/HOLMIUM LASER/STENT PLACEMENT;  Surgeon: Abbie Sons, MD;  Location: [facility];  Service: Urology;  Laterality: Left;  . ESOPHAGOGASTRODUODENOSCOPY  2000  . UMBILICAL HERNIA REPAIR  01/2004  . URETERAL STENT PLACEMENT  2/13   for L sided stone/ ecoli sepsis   Social History   Tobacco Use  . Smoking status: Never Smoker  . Smokeless tobacco: Never Used  Vaping Use  . Vaping Use: Never used  Substance Use Topics  . Alcohol use: No    Alcohol/week: 0.0 standard drinks  . Drug use: No   Family History  Problem Relation Age of Onset  . Early death Father 28       train accident  . Diabetes Mother   . Heart disease Mother   . Diabetes Sister   . Diabetes Brother   . Colon cancer Brother   . Cancer Paternal Grandmother        unknown  . Diabetes Sister    Allergies  Allergen Reactions  . Rofecoxib     REACTION: reaction not known  . Tetanus-Diphtheria Toxoids Td     REACTION: sick with fever  . Antihistamines, Chlorpheniramine-Type Palpitations  . Sulfa Antibiotics Rash   Current Outpatient Medications on File Prior to  Visit  Medication Sig Dispense Refill  . Ascorbic Acid (VITAMIN C) 500 MG tablet Take 500 mg by mouth daily.      . Aspirin-Caffeine (BC FAST PAIN RELIEF PO) Take 1 packet by mouth daily as needed.     . benazepril (LOTENSIN) 20 MG tablet Take 1 tablet (20 mg total) by mouth daily. 90 tablet 3  . CALCIUM-VITAMIN D PO Take 1 tablet by mouth 2 (two) times daily.     Marland Kitchen  Cyanocobalamin (B-12 PO) Take 1 tablet by mouth daily.    . furosemide (LASIX) 40 MG tablet Take 1 tablet (40 mg total) by mouth daily. 1 tablet 0  . glipiZIDE (GLUCOTROL XL) 10 MG 24 hr tablet Take 1 tablet (10 mg total) by mouth daily with breakfast. 90 tablet 3  . glucose blood (BAYER CONTOUR TEST) test strip Use to check blood sugar once daily as directed for DM (Dx. E11.9) 100 each 0  . metFORMIN (GLUCOPHAGE) 1000 MG tablet Take 1 tablet (1,000 mg total) by mouth 2 (two) times daily with a meal. 180 tablet 3  . MICROLET LANCETS MISC Use to check blood sugar once daily as directed for DM (Dx. E11.9) 100 each 0  . Multiple Vitamin (MULTIVITAMIN) capsule Take 1 capsule by mouth daily.      . naproxen sodium (ALEVE) 220 MG tablet Take 220 mg by mouth daily as needed.    . Omega-3 Fatty Acids (FISH OIL PO) Take 1 capsule by mouth daily.     Marland Kitchen omeprazole (PRILOSEC) 20 MG capsule Take 1 capsule (20 mg total) by mouth daily. 90 capsule 3  . Pseudoephedrine HCl (SINUS & ALLERGY 12 HOUR PO) Take by mouth. Taking daily (combination product for allergy and sinus)    . simvastatin (ZOCOR) 40 MG tablet Take 1 tablet (40 mg total) by mouth at bedtime. 90 tablet 3  . triamcinolone cream (KENALOG) 0.1 % APPLY TO AFFECTED AREA(S)  TOPICALLY TWO TIMES A DAY 30 g 1  . vitamin E 400 UNIT capsule Take 400 Units by mouth daily.       No current facility-administered medications on file prior to visit.     Review of Systems  Constitutional: Negative for activity change, appetite change, fatigue, fever and unexpected weight change.  HENT: Negative  for congestion, ear pain, rhinorrhea, sinus pressure and sore throat.   Eyes: Negative for pain, redness and visual disturbance.  Respiratory: Negative for cough, shortness of breath and wheezing.   Cardiovascular: Negative for chest pain and palpitations.  Gastrointestinal: Negative for abdominal pain, blood in stool, constipation and diarrhea.  Endocrine: Negative for polydipsia and polyuria.  Genitourinary: Negative for dysuria, frequency and urgency.  Musculoskeletal: Positive for arthralgias. Negative for back pain and myalgias.  Skin: Negative for pallor and rash.  Allergic/Immunologic: Negative for environmental allergies.  Neurological: Negative for dizziness, syncope and headaches.  Hematological: Negative for adenopathy. Does not bruise/bleed easily.  Psychiatric/Behavioral: Negative for decreased concentration and dysphoric mood. The patient is not nervous/anxious.        Objective:   Physical Exam Constitutional:      General: She is not in acute distress.    Appearance: Normal appearance. She is well-developed. She is obese. She is not ill-appearing.  HENT:     Head: Normocephalic and atraumatic.     Mouth/Throat:     Mouth: Mucous membranes are moist.  Eyes:     General: No scleral icterus.    Conjunctiva/sclera: Conjunctivae normal.     Pupils: Pupils are equal, round, and reactive to light.  Neck:     Thyroid: No thyromegaly.     Vascular: No carotid bruit or JVD.  Cardiovascular:     Rate and Rhythm: Normal rate and regular rhythm.     Heart sounds: Normal heart sounds. No gallop.   Pulmonary:     Effort: Pulmonary effort is normal. No respiratory distress.     Breath sounds: Normal breath sounds. No wheezing or rales.  Abdominal:  General: Bowel sounds are normal. There is no distension or abdominal bruit.     Palpations: Abdomen is soft. There is no mass.     Tenderness: There is no abdominal tenderness.  Musculoskeletal:     Cervical back: Normal  range of motion and neck supple.  Lymphadenopathy:     Cervical: No cervical adenopathy.  Skin:    General: Skin is warm and dry.     Coloration: Skin is not pale.     Findings: No erythema or rash.  Neurological:     Mental Status: She is alert.     Sensory: No sensory deficit.     Coordination: Coordination normal.     Deep Tendon Reflexes: Reflexes are normal and symmetric. Reflexes normal.  Psychiatric:        Mood and Affect: Mood normal.           Assessment & Plan:   Problem List Items Addressed This Visit      Cardiovascular and Mediastinum   Essential hypertension    bp in fair control at this time  BP Readings from Last 1 Encounters:  10/17/19 (!) 130/77  better on 2nd check No changes needed Most recent labs reviewed  Disc lifstyle change with low sodium diet and exercise          Endocrine   Diabetes type 2, controlled ([place]) - Primary    Lab Results  Component Value Date   HGBA1C 7.4 (H) 10/02/2019   This is not at goal Diet is not optimal  She chooses to work on diet/exercise instead of adding medication  No hypoglycemia with glipizide Taking aceand statin  Diet ref in detail-needs to cut out sweets/processed sugar       Hyperlipidemia associated with type 2 diabetes mellitus ([place])    Disc goals for lipids and reasons to control them Rev last labs with pt Rev low sat fat diet in detail Controlled with statin -tolerates simvastatin  Does watch fat in diet

## 2019-10-17 NOTE — Patient Instructions (Addendum)
Try to get most of your carbohydrates from produce (with the exception of white potatoes)  Eat less bread/pasta/rice/snack foods/cereals/sweets and other items from the middle of the grocery store (processed carbs)  Consider giving up sweets except for special occasions   [employer] exercise programs are very popular - on line or you can buy videos  Consider trying it   No change in your medicines for now   Follow up in 3 months  Goal is a1c under 7

## 2019-10-17 NOTE — Telephone Encounter (Signed)
Patient called office stating she got her pfizer vaccine first dose 05/02/19 and 05/23/19 for second dose.

## 2019-10-19 NOTE — Assessment & Plan Note (Signed)
Disc goals for lipids and reasons to control them Rev last labs with pt Rev low sat fat diet in detail Controlled with statin -tolerates simvastatin  Does watch fat in diet

## 2019-10-19 NOTE — Assessment & Plan Note (Signed)
bp in fair control at this time  BP Readings from Last 1 Encounters:  10/17/19 (!) 130/77  better on 2nd check No changes needed Most recent labs reviewed  Disc lifstyle change with low sodium diet and exercise

## 2019-10-19 NOTE — Assessment & Plan Note (Signed)
Lab Results  Component Value Date   HGBA1C 7.4 (H) 10/02/2019   This is not at goal Diet is not optimal  She chooses to work on diet/exercise instead of adding medication  No hypoglycemia with glipizide Taking aceand statin  Diet ref in detail-needs to cut out sweets/processed sugar

## 2019-10-20 NOTE — Telephone Encounter (Signed)
Chart updated

## 2019-10-23 ENCOUNTER — Other Ambulatory Visit: Payer: Self-pay | Admitting: Family Medicine

## 2019-10-23 ENCOUNTER — Telehealth: Payer: Self-pay | Admitting: Family Medicine

## 2019-10-23 MED ORDER — FUROSEMIDE 40 MG PO TABS: 40.0000 mg | ORAL_TABLET | Freq: Every day | ORAL | 0 refills | Status: DC

## 2019-10-23 NOTE — Telephone Encounter (Signed)
Patient called.  Patient's requesting a refill for Furosimide 40 mg 2 week refill sent to [facility]. Patient's out of medication.  Patient will request a full refill from [facility].

## 2019-10-23 NOTE — Telephone Encounter (Signed)
Rx sent and pt notified.

## 2019-11-04 ENCOUNTER — Ambulatory Visit: Payer: Medicare Other | Admitting: Physician Assistant

## 2019-11-04 ENCOUNTER — Other Ambulatory Visit: Payer: Self-pay

## 2019-11-04 ENCOUNTER — Encounter: Payer: Self-pay | Admitting: Physician Assistant

## 2019-11-04 VITALS — BP 112/68 | HR 92 | Ht 61.0 in | Wt 208.0 lb

## 2019-11-04 DIAGNOSIS — Z87442 Personal history of urinary calculi: Secondary | ICD-10-CM

## 2019-11-04 DIAGNOSIS — Z87440 Personal history of urinary (tract) infections: Secondary | ICD-10-CM | POA: Diagnosis not present

## 2019-11-04 DIAGNOSIS — N390 Urinary tract infection, site not specified: Secondary | ICD-10-CM | POA: Diagnosis not present

## 2019-11-04 DIAGNOSIS — N200 Calculus of kidney: Secondary | ICD-10-CM | POA: Diagnosis not present

## 2019-11-04 DIAGNOSIS — R3129 Other microscopic hematuria: Secondary | ICD-10-CM

## 2019-11-04 LAB — URINALYSIS, COMPLETE
Bilirubin, UA: NEGATIVE
Glucose, UA: NEGATIVE
Ketones, UA: NEGATIVE
Nitrite, UA: NEGATIVE
Protein,UA: NEGATIVE
Specific Gravity, UA: 1.015 (ref 1.005–1.030)
Urobilinogen, Ur: 0.2 mg/dL (ref 0.2–1.0)
pH, UA: 5 (ref 5.0–7.5)

## 2019-11-04 LAB — MICROSCOPIC EXAMINATION: WBC, UA: >30 /hpf — AB (ref 0–5)

## 2019-11-04 LAB — BLADDER SCAN AMB NON-IMAGING: Scan Result: 0

## 2019-11-04 MED ORDER — AMOXICILLIN-POT CLAVULANATE 875-125 MG PO TABS: 1.0000 | ORAL_TABLET | Freq: Two times a day (BID) | ORAL | 0 refills | Status: AC

## 2019-11-04 NOTE — Progress Notes (Signed)
11/04/2019 2:27 PM   Gabriella Howe May 18, 1934 [id number]  CC: Chief Complaint  Patient presents with  . Recurrent UTI    HPI: Gabriella Howe is a 85 y.o. female with PMH nephrolithiasis and recurrent UTI who presents today for follow up of the above. She was seen most recently by Dr. Bernardo Heater on 04/10/2019 for cysto stent removal following left URS for management of a 99mm left UPJ stone and two left lower pole stones; she wishes to switch to a female provider today.  She reports multiple UTIs in the past 6 months; typical symptoms include lower abdominal pressure, dysuria, and urinary frequency. Recent urine culture data as below:  08/19/2019 Mixed urogenital flora 09/03/2019 Proteus mirabilis, nitrofurantoin-resistant 09/22/2019 Mixed urogenital flora 09/26/2019 Proteus mirabilis, nitrofurantoin-resistant  UAs associated with the above cultures with persistent pyuria.  Both positive urine cultures treated with Keflex.  She reports having completed her most recent round of Keflex last week and is concerned that she may be infected again with a low volume urinary frequency and occasional dysuria noted over the past 1 to 2 days.  She is frustrated with her frequency of infections and concerned about the effects of multiple antibiotic courses on her health long-term.  She denies fever, chills, nausea, vomiting, and gross hematuria.  She does have chronic back pain, unchanged.  CT stone study dated 02/18/2019 revealed multiple left-sided renal calculi including a large, obstructing left UPJ stone, subsequently treated as above. She is expected to have residual left nephrolithiasis.  In-office UA today positive for trace-intact blood and 2+ leukocyte esterase; urine microscopy with >30 WBCs/HPF, 3-10 RBCs/HPF, and moderate bacteria.  PMH: Past Medical History:  Diagnosis Date  . Adenomatous colon polyp 2003  . Benign neoplasm of colon   . Carpal tunnel syndrome   . Chest pain, unspecified    . CHF (congestive heart failure) (HCC)    diastolic  . Degeneration of intervertebral disc, site unspecified   . Diabetes mellitus    type II  . Encounter for long-term (current) use of other medications   . Fluid overload   . GERD (gastroesophageal reflux disease)   . Heart disease, unspecified   . Hyperlipidemia   . Hypertension   . Kidney stone 3/12   hosp/ with stent  . Obesity, unspecified   . Rosacea   . Shortness of breath   . Unspecified asthma(493.90)   . Unspecified sleep apnea     Surgical History: Past Surgical History:  Procedure Laterality Date  . ABDOMINAL HYSTERECTOMY     partial, fibroids  . CYSTOSCOPY WITH STENT PLACEMENT Left 02/18/2019   Procedure: CYSTOSCOPY WITH STENT PLACEMENT;  Surgeon: Abbie Sons, MD;  Location: [facility] OR[facility];  Service: Urology;  Laterality: Left;  . CYSTOSCOPY/URETEROSCOPY/HOLMIUM LASER/STENT PLACEMENT Left 04/01/2019   Procedure: CYSTOSCOPY/URETEROSCOPY/HOLMIUM LASER/STENT PLACEMENT;  Surgeon: Abbie Sons, MD;  Location: [facility] ORS;  Service: Urology;  Laterality: Left;  . ESOPHAGOGASTRODUODENOSCOPY  2000  . UMBILICAL HERNIA REPAIR  01/2004  . URETERAL STENT PLACEMENT  2/13   for L sided stone/ ecoli sepsis    Home Medications:  Allergies as of 11/04/2019      Reactions   Rofecoxib    REACTION: reaction not known   Tetanus-diphtheria Toxoids Td    REACTION: sick with fever   Antihistamines, Chlorpheniramine-type Palpitations   Sulfa Antibiotics Rash      Medication List       Accurate as of November 04, 2019  2:27 PM. If you have  any questions, ask your nurse or doctor.        Accu-Chek Aviva Plus test strip Generic drug: glucose blood USE TO CHECK BLOOD SUGAR ONCE DAILY AND AS NEEDED (DX. E11.9)   Accu-Chek Softclix Lancets lancets USE AS DIRECTED TO CHECK BLOOD SUGAR ONCE DAILY AND AS NEEDED. (DX. E11.9)   amoxicillin-clavulanate 875-125 MG tablet Commonly known as: AUGMENTIN Take 1 tablet by mouth 2 (two)  times daily for 7 days. Started by: Debroah Loop, PA-C   B-12 PO Take 1 tablet by mouth daily.   BC FAST PAIN RELIEF PO Take 1 packet by mouth daily as needed.   benazepril 20 MG tablet Commonly known as: LOTENSIN Take 1 tablet (20 mg total) by mouth daily.   CALCIUM-VITAMIN D PO Take 1 tablet by mouth 2 (two) times daily.   FISH OIL PO Take 1 capsule by mouth daily.   furosemide 40 MG tablet Commonly known as: LASIX Take 1 tablet (40 mg total) by mouth daily.   glipiZIDE 10 MG 24 hr tablet Commonly known as: GLUCOTROL XL Take 1 tablet (10 mg total) by mouth daily with breakfast.   metFORMIN 1000 MG tablet Commonly known as: GLUCOPHAGE Take 1 tablet (1,000 mg total) by mouth 2 (two) times daily with a meal.   multivitamin capsule Take 1 capsule by mouth daily.   naproxen sodium 220 MG tablet Commonly known as: ALEVE Take 220 mg by mouth daily as needed.   omeprazole 20 MG capsule Commonly known as: PRILOSEC Take 1 capsule (20 mg total) by mouth daily.   simvastatin 40 MG tablet Commonly known as: ZOCOR Take 1 tablet (40 mg total) by mouth at bedtime.   SINUS & ALLERGY 12 HOUR PO Take by mouth. Taking daily (combination product for allergy and sinus)   triamcinolone cream 0.1 % Commonly known as: KENALOG APPLY TO AFFECTED AREA(S)  TOPICALLY TWO TIMES A DAY   vitamin C 500 MG tablet Commonly known as: ASCORBIC ACID Take 500 mg by mouth daily.   vitamin E 180 MG (400 UNITS) capsule Take 400 Units by mouth daily.       Allergies:  Allergies  Allergen Reactions  . Rofecoxib     REACTION: reaction not known  . Tetanus-Diphtheria Toxoids Td     REACTION: sick with fever  . Antihistamines, Chlorpheniramine-Type Palpitations  . Sulfa Antibiotics Rash    Family History: Family History  Problem Relation Age of Onset  . Early death Father 54       train accident  . Diabetes Mother   . Heart disease Mother   . Diabetes Sister   . Diabetes  Brother   . Colon cancer Brother   . Cancer Paternal Grandmother        unknown  . Diabetes Sister     Social History:   reports that she has never smoked. She has never used smokeless tobacco. She reports that she does not drink alcohol and does not use drugs.  Physical Exam: BP 112/68   Pulse 92   Ht 5\' 1"  (1.549 m)   Wt 208 lb (94.3 kg)   BMI 39.30 kg/m   Constitutional:  Alert and oriented, no acute distress, nontoxic appearing HEENT: , AT Cardiovascular: No clubbing, cyanosis, or edema Respiratory: Normal respiratory effort, no increased work of breathing Skin: No rashes, bruises or suspicious lesions Neurologic: Grossly intact, no focal deficits, moving all 4 extremities Psychiatric: Normal mood and affect  Laboratory Data: Results for orders placed or performed  in visit on 11/04/19  Microscopic Examination   Urine  Result Value Ref Range   WBC, UA >30 (A) 0 - 5 /hpf   RBC 3-10 (A) 0 - 2 /hpf   Epithelial Cells (non renal) 0-10 0 - 10 /hpf   Renal Epithel, UA 0-10 (A) None seen /hpf   Casts Present (A) None seen /lpf   Cast Type Hyaline casts N/A   Bacteria, UA Moderate (A) None seen/Few  Urinalysis, Complete  Result Value Ref Range   Specific Gravity, UA 1.015 1.005 - 1.030   pH, UA 5.0 5.0 - 7.5   Color, UA Yellow Yellow   Appearance Ur Cloudy (A) Clear   Leukocytes,UA 2+ (A) Negative   Protein,UA Negative Negative/Trace   Glucose, UA Negative Negative   Ketones, UA Negative Negative   RBC, UA Trace (A) Negative   Bilirubin, UA Negative Negative   Urobilinogen, Ur 0.2 0.2 - 1.0 mg/dL   Nitrite, UA Negative Negative   Microscopic Examination See below:   BLADDER SCAN AMB NON-IMAGING  Result Value Ref Range   Scan Result 0    Assessment & Plan:   85 year old female with PMH nephrolithiasis and recurrent UTI presents with recurrent Proteus UTI over the past 2 months.  UA today notable for pyuria, microscopic hematuria, and bacteriuria.  1. Recurrent  UTI Urine grossly infected today, will start empiric Augmentin and send for culture for further analysis.  PVR WNL, reassuring for incomplete bladder emptying as contributory.    Ultimately, patient may benefit from topical vaginal estrogen cream and recurrent UTI protocol given her postmenopausal status and frequency of infection.  Will defer these pending imaging results as below. - Urinalysis, Complete - CULTURE, URINE COMPREHENSIVE - amoxicillin-clavulanate (AUGMENTIN) 875-125 MG tablet; Take 1 tablet by mouth 2 (two) times daily for 7 days.  Dispense: 14 tablet; Refill: 0 - BLADDER SCAN AMB NON-IMAGING  2. Microscopic hematuria Unclear if this is consistent with acute cystitis with hematuria versus nephrolithiasis versus unknown etiology.  Recommend repeat UA in 1 week after completion of culture appropriate antibiotics to prove resolution of MH.  If MH has resolved, recommend CT stone study as below.  If MH persists, recommend CTU for further evaluation of hematuria. - Urinalysis, Complete; Future  3. History of nephrolithiasis Given her history of recurrent Proteus UTIs, I am concerned for possible stone nidus as the source of her recent infections.  I recommend reimaging following her recent ureteroscopy to assess her current stone burden.  We will proceed with CT stone study if hematuria clears on antibiotics, CT urogram if it does not.  Counseled patient that I would contact her with her repeat UA results and to discuss next steps.  She expressed understanding.  Return in about 1 week (around 11/11/2019) for Lab visit for UA.  Debroah Loop, PA-C  [facility] [address], [place], [place] [address] [phone]

## 2019-11-04 NOTE — Patient Instructions (Signed)
It looks like you may have a urinary tract infection today. I have sent a prescription for antibiotics to [facility]. Please start these today.  I will send your urine for culture today and call you if I need to switch your antibiotic based on your results.  You had a small amount of blood in your urine today. I would like you to return to clinic in one week to provide another urine sample to see if it has gone away with antibiotics. If it has, then we will get a standard CT scan like you have had in the past to assess your current kidney stones. If the blood has not gone away, then I will order a CT scan with IV contrast to also look for additional sources of the bleeding.

## 2019-11-08 LAB — CULTURE, URINE COMPREHENSIVE

## 2019-11-11 ENCOUNTER — Other Ambulatory Visit: Payer: Self-pay

## 2019-11-11 ENCOUNTER — Other Ambulatory Visit: Payer: Medicare Other

## 2019-11-12 ENCOUNTER — Telehealth: Payer: Self-pay | Admitting: Urology

## 2019-11-12 ENCOUNTER — Other Ambulatory Visit: Payer: Medicare Other

## 2019-11-12 DIAGNOSIS — N200 Calculus of kidney: Secondary | ICD-10-CM

## 2019-11-12 DIAGNOSIS — R3129 Other microscopic hematuria: Secondary | ICD-10-CM | POA: Diagnosis not present

## 2019-11-12 DIAGNOSIS — N390 Urinary tract infection, site not specified: Secondary | ICD-10-CM

## 2019-11-12 LAB — MICROSCOPIC EXAMINATION: WBC, UA: >30 /hpf — AB (ref 0–5)

## 2019-11-12 LAB — URINALYSIS, COMPLETE
Bilirubin, UA: NEGATIVE
Glucose, UA: NEGATIVE
Ketones, UA: NEGATIVE
Nitrite, UA: NEGATIVE
Protein,UA: NEGATIVE
Specific Gravity, UA: >1.03 — ABNORMAL HIGH (ref 1.005–1.030)
Urobilinogen, Ur: 0.2 mg/dL (ref 0.2–1.0)
pH, UA: 5 (ref 5.0–7.5)

## 2019-11-12 NOTE — Telephone Encounter (Signed)
Please let Gabriella Howe know that her urine sample from today was negative for micro heme, so we will go ahead and order a CT Renal stone study to evaluate her current stone burden.  I will send the urine for culture as the sample was notable for pyuria and yeast.  Would you also ask if she is having symptoms of UTI, vaginal yeast infection or stones at this time?

## 2019-11-12 NOTE — Telephone Encounter (Signed)
Patient states she is not having any symptoms now. She took her last ABX this morning. She states usually it takes about 3 days and the UTI symptoms come right back.

## 2019-11-18 LAB — CULTURE, URINE COMPREHENSIVE

## 2019-11-18 NOTE — Telephone Encounter (Signed)
-----   Message from Nori Riis, PA-C sent at 11/18/2019  1:54 PM EDT ----- Please add further identification for her urine culture.

## 2019-11-19 ENCOUNTER — Telehealth: Payer: Self-pay | Admitting: *Deleted

## 2019-11-19 NOTE — Telephone Encounter (Signed)
Her most recent urine culture grew out yeast and we are waiting on identification and sensitivities. Hopefully, they will be available by Friday.

## 2019-11-19 NOTE — Telephone Encounter (Signed)
Patient left a voicemail stating that she just finished taking an antibiotic for a UTI on Friday. Patient stated that the UTI symptoms have started back and she has recurrent UTI's. Patient stated that she is scheduled for a kidney scan on 12/02/19. Patient wants to know if Dr. Glori Bickers will send her an antibiotic in since she has the kidney scan scheduled.  Patient stated that [facility] gave her Amoxicillin/Augmentin 875-125 mg last time. Advised patient that usually our providers require a urine sample prior to prescribing an antibiotic. Patient stated that this has been going on for over 4 months and Dr. Glori Bickers is aware of this. Patient requested that this message be sent back to Dr. Glori Bickers. Pharmacy [place]

## 2019-11-19 NOTE — Telephone Encounter (Signed)
I will cc to her urology providers.  Please ask what her symptoms are?   We need a urine sample for culture.  Any input from urology would be appreciated as well, thanks !

## 2019-11-20 MED ORDER — FLUCONAZOLE 100 MG PO TABS: 100.0000 mg | ORAL_TABLET | Freq: Every day | ORAL | 0 refills | Status: DC

## 2019-11-20 NOTE — Telephone Encounter (Signed)
Patient notified and Diflucan was sent to the pharmacy.

## 2019-11-20 NOTE — Addendum Note (Signed)
Addended by: Kyra Manges on: 11/20/2019 04:02 PM   Modules accepted: Orders

## 2019-11-20 NOTE — Telephone Encounter (Signed)
Pt notified of Dr. Glori Bickers and Urologist comments. Pt hopes she gets a call back tomorrow from urologist due her her urinary sxs worsening. I told her I would send note to urologist so they are aware

## 2019-11-20 NOTE — Telephone Encounter (Signed)
Please let pt know that her urologist is still waiting on final sensitivities on her prev culture that grew yeast as well

## 2019-11-21 LAB — ORGANISM IDENTIFICATION, YEAST

## 2019-11-21 LAB — SPECIMEN STATUS REPORT

## 2019-12-02 ENCOUNTER — Ambulatory Visit
Admission: RE | Admit: 2019-12-02 | Discharge: 2019-12-02 | Disposition: A | Discharge status: Home / Self Care | Payer: Medicare Other | Source: Ambulatory Visit | Attending: Urology | Admitting: Urology

## 2019-12-02 ENCOUNTER — Other Ambulatory Visit: Payer: Self-pay

## 2019-12-02 DIAGNOSIS — N200 Calculus of kidney: Secondary | ICD-10-CM | POA: Diagnosis not present

## 2019-12-02 DIAGNOSIS — I700 Atherosclerosis of aorta: Secondary | ICD-10-CM | POA: Diagnosis not present

## 2019-12-02 DIAGNOSIS — K8689 Other specified diseases of pancreas: Secondary | ICD-10-CM | POA: Diagnosis not present

## 2019-12-02 DIAGNOSIS — K8020 Calculus of gallbladder without cholecystitis without obstruction: Secondary | ICD-10-CM | POA: Diagnosis not present

## 2019-12-05 ENCOUNTER — Telehealth: Payer: Self-pay | Admitting: Family Medicine

## 2019-12-05 NOTE — Telephone Encounter (Signed)
Patient called asking for the result of her CT scan. Please advise

## 2019-12-05 NOTE — Telephone Encounter (Signed)
Sam has her CT in her inbox.

## 2019-12-09 NOTE — Telephone Encounter (Signed)
Patient notified, appt sched for 9/22 with Dr Erlene Quan.

## 2019-12-09 NOTE — Telephone Encounter (Signed)
Please contact the patient and offer her an appointment with Dr. Erlene Quan to discuss definitive management of her left renal stones given her history of recurrent Proteus UTI.  Case discussed with Dr. Erlene Quan, who agrees definitive management would be appropriate due to concern for possible stone nidus.

## 2019-12-16 NOTE — Progress Notes (Signed)
12/17/2019 9:59 PM   Gabriella Howe [birth year] [id number]  Referring provider: Abner Greenspan, MD [address] [place],  [place] [address] Chief Complaint  Patient presents with  . Nephrolithiasis    HPI: Gabriella Howe is a 85 y.o. female who presents today to discuss definitive management of left renal stone. Patient is accompanied by her daughter today.   She has a personal history of kidney stones. CT in 01/2019 remarkable for a 10 mm left UPJ calculus with severe hydronephrosis.  She is s/p left ureteroscopy and laser lithotripsy with Dr. Bernardo Heater on 04/01/19. status post stent placement early November 2020 for an obstructing 13 mm left UPJ calculus with possible infection.  CT also showed 6-8 mm calculi x2 in addition to minute renal calculi.  She was seen by Dr. Bernardo Heater on 04/10/2019 for cysto stent removal following left URS for management of a 48mm left UPJ stone and two left lower pole stones.   She reported multiple UTIs in the past 6 months; typical symptoms include lower abdominal pressure, dysuria, and urinary frequency. Recent urine culture data as below:  08/19/2019        Mixed urogenital flora 09/03/2019          Proteus mirabilis, nitrofurantoin-resistant 09/22/2019        Mixed urogenital flora 09/26/2019          Proteus mirabilis, nitrofurantoin-resistant  UAs associated with the above cultures with persistent pyuria.  Both positive urine cultures treated with Keflex.   She was last seen by Debroah Loop, PA-C on 11/04/2019 (see note for details).   CT renal stone study on 12/02/2019 noted left nephrolithiasis, without obstructive uropathy. Cholelithiasis. Perirectal and right inguinal soft tissue density lesions which are likely enlarged lymph nodes. Given presence back to 2014, these can be presumed benign.  She reports that her recurrent UTI symptoms effect her quality of life. She has lower abdominal burning with urination, low abdominal  pressure, and low back pain with infections. She notes back pain effects her gait.. She notes urge incontinence symptoms. Denies trying meds for OAB.  Today she has some burning and low abdominal pressure. She has vaginal burning with urination. She has urgency and frequency. She recently completed Augmentin.   PMH: Past Medical History:  Diagnosis Date  . Adenomatous colon polyp 2003  . Benign neoplasm of colon   . Carpal tunnel syndrome   . Chest pain, unspecified   . CHF (congestive heart failure) (HCC)    diastolic  . Degeneration of intervertebral disc, site unspecified   . Diabetes mellitus    type II  . Encounter for long-term (current) use of other medications   . Fluid overload   . GERD (gastroesophageal reflux disease)   . Heart disease, unspecified   . Hyperlipidemia   . Hypertension   . Kidney stone 3/12   hosp/ with stent  . Obesity, unspecified   . Rosacea   . Shortness of breath   . Unspecified asthma(493.90)   . Unspecified sleep apnea     Surgical History: Past Surgical History:  Procedure Laterality Date  . ABDOMINAL HYSTERECTOMY     partial, fibroids  . CYSTOSCOPY WITH STENT PLACEMENT Left 02/18/2019   Procedure: CYSTOSCOPY WITH STENT PLACEMENT;  Surgeon: Abbie Sons, MD;  Location: [facility];  Service: Urology;  Laterality: Left;  . CYSTOSCOPY/URETEROSCOPY/HOLMIUM LASER/STENT PLACEMENT Left 04/01/2019   Procedure: CYSTOSCOPY/URETEROSCOPY/HOLMIUM LASER/STENT PLACEMENT;  Surgeon: Abbie Sons, MD;  Location: [facility];  Service: Urology;  Laterality: Left;  . ESOPHAGOGASTRODUODENOSCOPY  2000  . UMBILICAL HERNIA REPAIR  01/2004  . URETERAL STENT PLACEMENT  2/13   for L sided stone/ ecoli sepsis    Home Medications:  Allergies as of 12/17/2019      Reactions   Rofecoxib    REACTION: reaction not known   Tetanus-diphtheria Toxoids Td    REACTION: sick with fever   Antihistamines, Chlorpheniramine-type Palpitations   Sulfa Antibiotics Rash       Medication List       Accurate as of December 17, 2019  9:59 PM. If you have any questions, ask your nurse or doctor.        Accu-Chek Aviva Plus test strip Generic drug: glucose blood USE TO CHECK BLOOD SUGAR ONCE DAILY AND AS NEEDED (DX. E11.9)   Accu-Chek Softclix Lancets lancets USE AS DIRECTED TO CHECK BLOOD SUGAR ONCE DAILY AND AS NEEDED. (DX. E11.9)   B-12 PO Take 1 tablet by mouth daily.   BC FAST PAIN RELIEF PO Take 1 packet by mouth daily as needed.   benazepril 20 MG tablet Commonly known as: LOTENSIN Take 1 tablet (20 mg total) by mouth daily.   CALCIUM-VITAMIN D PO Take 1 tablet by mouth 2 (two) times daily.   FISH OIL PO Take 1 capsule by mouth daily.   fluconazole 100 MG tablet Commonly known as: DIFLUCAN Take 1 tablet (100 mg total) by mouth daily.   furosemide 40 MG tablet Commonly known as: LASIX Take 1 tablet (40 mg total) by mouth daily.   glipiZIDE 10 MG 24 hr tablet Commonly known as: GLUCOTROL XL Take 1 tablet (10 mg total) by mouth daily with breakfast.   metFORMIN 1000 MG tablet Commonly known as: GLUCOPHAGE Take 1 tablet (1,000 mg total) by mouth 2 (two) times daily with a meal.   multivitamin capsule Take 1 capsule by mouth daily.   naproxen sodium 220 MG tablet Commonly known as: ALEVE Take 220 mg by mouth daily as needed.   omeprazole 20 MG capsule Commonly known as: PRILOSEC Take 1 capsule (20 mg total) by mouth daily.   simvastatin 40 MG tablet Commonly known as: ZOCOR Take 1 tablet (40 mg total) by mouth at bedtime.   SINUS & ALLERGY 12 HOUR PO Take by mouth. Taking daily (combination product for allergy and sinus)   triamcinolone cream 0.1 % Commonly known as: KENALOG APPLY TO AFFECTED AREA(S)  TOPICALLY TWO TIMES A DAY   vitamin C 500 MG tablet Commonly known as: ASCORBIC ACID Take 500 mg by mouth daily.   vitamin E 180 MG (400 UNITS) capsule Take 400 Units by mouth daily.       Allergies:  Allergies    Allergen Reactions  . Rofecoxib     REACTION: reaction not known  . Tetanus-Diphtheria Toxoids Td     REACTION: sick with fever  . Antihistamines, Chlorpheniramine-Type Palpitations  . Sulfa Antibiotics Rash    Family History: Family History  Problem Relation Age of Onset  . Early death Father 30       train accident  . Diabetes Mother   . Heart disease Mother   . Diabetes Sister   . Diabetes Brother   . Colon cancer Brother   . Cancer Paternal Grandmother        unknown  . Diabetes Sister     Social History:  reports that she has never smoked. She has never used smokeless tobacco. She reports that she does  not drink alcohol and does not use drugs.   Physical Exam: BP (!) 181/101 (BP Location: Left Arm, Patient Position: Sitting, Cuff Size: Normal)   Pulse 79   Ht 5\' 1"  (1.549 m)   Wt 216 lb 3.2 oz (98.1 kg)   BMI 40.85 kg/m   Constitutional:  Alert and oriented, No acute distress. In wheelchair, accompanied by daughter. HEENT: [place] AT, moist mucus membranes.  Trachea midline, no masses. Cardiovascular: No clubbing, cyanosis, or edema. Respiratory: Normal respiratory effort, no increased work of breathing. Skin: No rashes, bruises or suspicious lesions. Neurologic: Grossly intact, no focal deficits, moving all 4 extremities. Psychiatric: Normal mood and affect.  Laboratory Data:  Lab Results  Component Value Date   CREATININE 0.92 10/02/2019    Lab Results  Component Value Date   HGBA1C 7.4 (H) 10/02/2019    Assessment & Plan:    1. rUTI/chronic bacterial colonization She was have multiple urinalysis growing Proteus, suspect that there is a possibility she may be chronically colonized Based on her symptoms, they are more consistent with OAB/bladder spasms than true UTI type infections We will plan on treating her OAB symptoms today and see if this resolves or improves some of the symptoms she is experiencing are then continued she with antibiotics -Given 4  Myrbetriq 50 mg daily, # 28 samples; I have advised the patient of the side effects of Myrbetriq, such as: elevation in BP, urinary retention and/or HA   RTC in 1 month for UA and reassessment of urinary symptoms.   2. History of nephrolithiasis  S/p left ureteroscopy and laser lithotripsy with Dr. Bernardo Heater on 04/01/19  We discussed stones can be the nidus of infection however stones are relatively small in upper tract. Less likely however unable to exclude this as an etiology.  We discussed various treatment options including ESWL vs. ureteroscopy, laser lithotripsy, and stent. We discussed the risks and benefits of both including bleeding, infection, damage to surrounding structures, efficacy with need for possible further intervention, and need for temporary ureteral stent.  Patient refused surgical intervention.   Return in about 4 weeks (around 01/14/2020), or 1 month with PVR.  [facility] [address], [place], [place] [address] [phone]  I, Selena Batten, am acting as a scribe for Dr. Hollice Espy.  I have reviewed the above documentation for accuracy and completeness, and I agree with the above.   Hollice Espy, MD  I spent 30 min with this patient of which greater than 50% was spent in counseling and coordination of care with the patient.

## 2019-12-17 ENCOUNTER — Other Ambulatory Visit: Payer: Self-pay

## 2019-12-17 ENCOUNTER — Ambulatory Visit: Payer: Medicare Other | Admitting: Urology

## 2019-12-17 ENCOUNTER — Encounter: Payer: Self-pay | Admitting: Urology

## 2019-12-17 VITALS — BP 181/101 | HR 79 | Ht 61.0 in | Wt 216.2 lb

## 2019-12-17 DIAGNOSIS — N200 Calculus of kidney: Secondary | ICD-10-CM | POA: Diagnosis not present

## 2019-12-17 DIAGNOSIS — N390 Urinary tract infection, site not specified: Secondary | ICD-10-CM

## 2020-01-14 NOTE — Progress Notes (Signed)
01/15/2020 3:13 PM   Gabriella Howe 03-05-1935 [id number]  Referring provider: Abner Greenspan, MD [address] [place],  [place] [address] Chief Complaint  Patient presents with  . Recurrent UTI    50mo w/PVR    HPI: Gabriella Howe is a 85 y.o. female who returns today for a 1 month follow up rUTI/chronic bacterial colonization and history of nephrolithiasis.   She has a personal history of kidney stones. CT in 01/2019 remarkable for a 10 mm left UPJ calculus with severe hydronephrosis.  She is s/p left ureteroscopy and laser lithotripsy with Dr. Bernardo Heater on 04/01/19. status post stent placement early November 2020 for an obstructing 13 mm left UPJ calculus with possible infection.CT also showed 6-8 mm calculi x2 in addition to minute renal calculi.  She was seen by Dr. Bernardo Heater on 04/10/2019 for cysto stent removal following left URS for management of a 71mm left UPJ stone and two left lower pole stones.   She reported multiple UTIs in the past 6 months; typical symptoms includelower abdominal pressure, dysuria, and urinary frequency. Recent urine culture data as below:  5/25/2021Mixed urogenital flora 6/9/2021Proteus mirabilis, nitrofurantoin-resistant 6/28/2021Mixed urogenital flora 7/2/2021Proteus mirabilis, nitrofurantoin-resistant  UAs associated with the above cultures with persistent pyuria.Both positive urine cultures treated with Keflex.  She was last seen by Gabriella Loop, PA-C on 11/04/2019 (see note for details).   CT renal stone study on 12/02/2019 noted left nephrolithiasis, without obstructive uropathy. Cholelithiasis. Perirectal and right inguinal soft tissue density lesions which are likely enlarged lymph nodes. Given presence back to 2014, these can be presumed benign.  Today she states the Myrbetriq 50  has minimally helped. She is still having accidents and having to change her clothes. She has  occasional burning and pelvic pressure with cloudy urine. Symptoms started around the same time she had stones. She states having urge incontinence symptoms. She is not burning today.   PVR 0 mL.  Her last documented infection was several months ago.  She is doing well from this perspective.  PMH: Past Medical History:  Diagnosis Date  . Adenomatous colon polyp 2003  . Benign neoplasm of colon   . Carpal tunnel syndrome   . Chest pain, unspecified   . CHF (congestive heart failure) (HCC)    diastolic  . Degeneration of intervertebral disc, site unspecified   . Diabetes mellitus    type II  . Encounter for long-term (current) use of other medications   . Fluid overload   . GERD (gastroesophageal reflux disease)   . Heart disease, unspecified   . Hyperlipidemia   . Hypertension   . Kidney stone 3/12   hosp/ with stent  . Obesity, unspecified   . Rosacea   . Shortness of breath   . Unspecified asthma(493.90)   . Unspecified sleep apnea     Surgical History: Past Surgical History:  Procedure Laterality Date  . ABDOMINAL HYSTERECTOMY     partial, fibroids  . CYSTOSCOPY WITH STENT PLACEMENT Left 02/18/2019   Procedure: CYSTOSCOPY WITH STENT PLACEMENT;  Surgeon: Abbie Sons, MD;  Location: [facility];  Service: Urology;  Laterality: Left;  . CYSTOSCOPY/URETEROSCOPY/HOLMIUM LASER/STENT PLACEMENT Left 04/01/2019   Procedure: CYSTOSCOPY/URETEROSCOPY/HOLMIUM LASER/STENT PLACEMENT;  Surgeon: Abbie Sons, MD;  Location: [facility];  Service: Urology;  Laterality: Left;  . ESOPHAGOGASTRODUODENOSCOPY  2000  . UMBILICAL HERNIA REPAIR  01/2004  . URETERAL STENT PLACEMENT  2/13   for L sided stone/ ecoli sepsis    Home  Medications:  Allergies as of 01/15/2020      Reactions   Rofecoxib    REACTION: reaction not known   Tetanus-diphtheria Toxoids Td    REACTION: sick with fever   Antihistamines, Chlorpheniramine-type Palpitations   Sulfa Antibiotics Rash      Medication  List       Accurate as of January 15, 2020  3:13 PM. If you have any questions, ask your nurse or doctor.        STOP taking these medications   fluconazole 100 MG tablet Commonly known as: DIFLUCAN Stopped by: Hollice Espy, MD     TAKE these medications   Accu-Chek Aviva Plus test strip Generic drug: glucose blood USE TO CHECK BLOOD SUGAR ONCE DAILY AND AS NEEDED (DX. E11.9)   Accu-Chek Softclix Lancets lancets USE AS DIRECTED TO CHECK BLOOD SUGAR ONCE DAILY AND AS NEEDED. (DX. E11.9)   B-12 PO Take 1 tablet by mouth daily.   BC FAST PAIN RELIEF PO Take 1 packet by mouth daily as needed.   benazepril 20 MG tablet Commonly known as: LOTENSIN Take 1 tablet (20 mg total) by mouth daily.   CALCIUM-VITAMIN D PO Take 1 tablet by mouth 2 (two) times daily.   FISH OIL PO Take 1 capsule by mouth daily.   furosemide 40 MG tablet Commonly known as: LASIX Take 1 tablet (40 mg total) by mouth daily.   glipiZIDE 10 MG 24 hr tablet Commonly known as: GLUCOTROL XL Take 1 tablet (10 mg total) by mouth daily with breakfast.   metFORMIN 1000 MG tablet Commonly known as: GLUCOPHAGE Take 1 tablet (1,000 mg total) by mouth 2 (two) times daily with a meal.   multivitamin capsule Take 1 capsule by mouth daily.   naproxen sodium 220 MG tablet Commonly known as: ALEVE Take 220 mg by mouth daily as needed.   omeprazole 20 MG capsule Commonly known as: PRILOSEC Take 1 capsule (20 mg total) by mouth daily.   simvastatin 40 MG tablet Commonly known as: ZOCOR Take 1 tablet (40 mg total) by mouth at bedtime.   SINUS & ALLERGY 12 HOUR PO Take by mouth. Taking daily (combination product for allergy and sinus)   triamcinolone cream 0.1 % Commonly known as: KENALOG APPLY TO AFFECTED AREA(S)  TOPICALLY TWO TIMES A DAY   vitamin C 500 MG tablet Commonly known as: ASCORBIC ACID Take 500 mg by mouth daily.   vitamin E 180 MG (400 UNITS) capsule Take 400 Units by mouth daily.         Allergies:  Allergies  Allergen Reactions  . Rofecoxib     REACTION: reaction not known  . Tetanus-Diphtheria Toxoids Td     REACTION: sick with fever  . Antihistamines, Chlorpheniramine-Type Palpitations  . Sulfa Antibiotics Rash    Family History: Family History  Problem Relation Age of Onset  . Early death Father 47       train accident  . Diabetes Mother   . Heart disease Mother   . Diabetes Sister   . Diabetes Brother   . Colon cancer Brother   . Cancer Paternal Grandmother        unknown  . Diabetes Sister     Social History:  reports that she has never smoked. She has never used smokeless tobacco. She reports that she does not drink alcohol and does not use drugs.   Physical Exam: BP (!) 154/89   Pulse 80   Constitutional:  Alert and oriented, No  acute distress. HEENT: [place] AT, moist mucus membranes.  Trachea midline, no masses. Cardiovascular: No clubbing, cyanosis, or edema. Respiratory: Normal respiratory effort, no increased work of breathing. Skin: No rashes, bruises or suspicious lesions. Neurologic: Grossly intact, no focal deficits, moving all 4 extremities. Psychiatric: Normal mood and affect.  Laboratory Data:  Lab Results  Component Value Date   CREATININE 0.92 10/02/2019    Lab Results  Component Value Date   HGBA1C 7.4 (H) 10/02/2019     Pertinent Imaging: Results for orders placed or performed in visit on 01/15/20  BLADDER SCAN AMB NON-IMAGING  Result Value Ref Range   Scan Result 0 ML     Assessment & Plan:    1. Recurrent UTI/chronic bacterial colonization No infection since July, currently asymptomatic in terms of burning and UTI specific symptoms  2.  OAB/urgency/urge incontinence Symptoms are severe and very bothersome Bladder scan by PVR is 0 mL. Minimal improvement on Myrbetriq 50 mg.  Second line therapies were discussed today in detail including Botox versus PTNS versus InterStim Discussed botox injections  along with the benefits and risk of infections, hematuria, dysuria and urinary retention( approximately 5%). Patient is interested in injections and was provided with literature. We will plan for the initial procedure in the operating room and if she is pleased with this result, we will consider subsequent injections in the office thereafter  2 History of nephrolithiasis S/p left ureteroscopy and laser lithotripsy with Dr. Bernardo Heater on 04/01/19 Not interested in treating left-sided stone burden If she continues to have frequent Proteus infections, we will more strongly urged her to have her left-sided stone burden treated   [facility] [address], [place], [place] [address] [phone]  I, Gabriella Howe, am acting as a scribe for Dr. Hollice Espy.  I have reviewed the above documentation for accuracy and completeness, and I agree with the above.   Hollice Espy, MD

## 2020-01-14 NOTE — H&P (View-Only) (Signed)
01/15/2020 3:13 PM   Gabriella Howe 10/21/1934 [id number]  Referring provider: Abner Greenspan, MD [address] [place],  [place] [address] Chief Complaint  Patient presents with  . Recurrent UTI    55mo w/PVR    HPI: Gabriella Howe is a 85 y.o. female who returns today for a 1 month follow up rUTI/chronic bacterial colonization and history of nephrolithiasis.   She has a personal history of kidney stones. CT in 01/2019 remarkable for a 10 mm left UPJ calculus with severe hydronephrosis.  She is s/p left ureteroscopy and laser lithotripsy with Dr. Bernardo Heater on 04/01/19. status post stent placement early November 2020 for an obstructing 13 mm left UPJ calculus with possible infection.CT also showed 6-8 mm calculi x2 in addition to minute renal calculi.  She was seen by Dr. Bernardo Heater on 04/10/2019 for cysto stent removal following left URS for management of a 63mm left UPJ stone and two left lower pole stones.   She reported multiple UTIs in the past 6 months; typical symptoms includelower abdominal pressure, dysuria, and urinary frequency. Recent urine culture data as below:  5/25/2021Mixed urogenital flora 6/9/2021Proteus mirabilis, nitrofurantoin-resistant 6/28/2021Mixed urogenital flora 7/2/2021Proteus mirabilis, nitrofurantoin-resistant  UAs associated with the above cultures with persistent pyuria.Both positive urine cultures treated with Keflex.  She was last seen by Debroah Loop, PA-C on 11/04/2019 (see note for details).   CT renal stone study on 12/02/2019 noted left nephrolithiasis, without obstructive uropathy. Cholelithiasis. Perirectal and right inguinal soft tissue density lesions which are likely enlarged lymph nodes. Given presence back to 2014, these can be presumed benign.  Today she states the Myrbetriq 50  has minimally helped. She is still having accidents and having to change her clothes. She has  occasional burning and pelvic pressure with cloudy urine. Symptoms started around the same time she had stones. She states having urge incontinence symptoms. She is not burning today.   PVR 0 mL.  Her last documented infection was several months ago.  She is doing well from this perspective.  PMH: Past Medical History:  Diagnosis Date  . Adenomatous colon polyp 2003  . Benign neoplasm of colon   . Carpal tunnel syndrome   . Chest pain, unspecified   . CHF (congestive heart failure) (HCC)    diastolic  . Degeneration of intervertebral disc, site unspecified   . Diabetes mellitus    type II  . Encounter for long-term (current) use of other medications   . Fluid overload   . GERD (gastroesophageal reflux disease)   . Heart disease, unspecified   . Hyperlipidemia   . Hypertension   . Kidney stone 3/12   hosp/ with stent  . Obesity, unspecified   . Rosacea   . Shortness of breath   . Unspecified asthma(493.90)   . Unspecified sleep apnea     Surgical History: Past Surgical History:  Procedure Laterality Date  . ABDOMINAL HYSTERECTOMY     partial, fibroids  . CYSTOSCOPY WITH STENT PLACEMENT Left 02/18/2019   Procedure: CYSTOSCOPY WITH STENT PLACEMENT;  Surgeon: Abbie Sons, MD;  Location: [facility];  Service: Urology;  Laterality: Left;  . CYSTOSCOPY/URETEROSCOPY/HOLMIUM LASER/STENT PLACEMENT Left 04/01/2019   Procedure: CYSTOSCOPY/URETEROSCOPY/HOLMIUM LASER/STENT PLACEMENT;  Surgeon: Abbie Sons, MD;  Location: [facility];  Service: Urology;  Laterality: Left;  . ESOPHAGOGASTRODUODENOSCOPY  2000  . UMBILICAL HERNIA REPAIR  01/2004  . URETERAL STENT PLACEMENT  2/13   for L sided stone/ ecoli sepsis    Home  Medications:  Allergies as of 01/15/2020      Reactions   Rofecoxib    REACTION: reaction not known   Tetanus-diphtheria Toxoids Td    REACTION: sick with fever   Antihistamines, Chlorpheniramine-type Palpitations   Sulfa Antibiotics Rash      Medication  List       Accurate as of January 15, 2020  3:13 PM. If you have any questions, ask your nurse or doctor.        STOP taking these medications   fluconazole 100 MG tablet Commonly known as: DIFLUCAN Stopped by: Hollice Espy, MD     TAKE these medications   Accu-Chek Aviva Plus test strip Generic drug: glucose blood USE TO CHECK BLOOD SUGAR ONCE DAILY AND AS NEEDED (DX. E11.9)   Accu-Chek Softclix Lancets lancets USE AS DIRECTED TO CHECK BLOOD SUGAR ONCE DAILY AND AS NEEDED. (DX. E11.9)   B-12 PO Take 1 tablet by mouth daily.   BC FAST PAIN RELIEF PO Take 1 packet by mouth daily as needed.   benazepril 20 MG tablet Commonly known as: LOTENSIN Take 1 tablet (20 mg total) by mouth daily.   CALCIUM-VITAMIN D PO Take 1 tablet by mouth 2 (two) times daily.   FISH OIL PO Take 1 capsule by mouth daily.   furosemide 40 MG tablet Commonly known as: LASIX Take 1 tablet (40 mg total) by mouth daily.   glipiZIDE 10 MG 24 hr tablet Commonly known as: GLUCOTROL XL Take 1 tablet (10 mg total) by mouth daily with breakfast.   metFORMIN 1000 MG tablet Commonly known as: GLUCOPHAGE Take 1 tablet (1,000 mg total) by mouth 2 (two) times daily with a meal.   multivitamin capsule Take 1 capsule by mouth daily.   naproxen sodium 220 MG tablet Commonly known as: ALEVE Take 220 mg by mouth daily as needed.   omeprazole 20 MG capsule Commonly known as: PRILOSEC Take 1 capsule (20 mg total) by mouth daily.   simvastatin 40 MG tablet Commonly known as: ZOCOR Take 1 tablet (40 mg total) by mouth at bedtime.   SINUS & ALLERGY 12 HOUR PO Take by mouth. Taking daily (combination product for allergy and sinus)   triamcinolone cream 0.1 % Commonly known as: KENALOG APPLY TO AFFECTED AREA(S)  TOPICALLY TWO TIMES A DAY   vitamin C 500 MG tablet Commonly known as: ASCORBIC ACID Take 500 mg by mouth daily.   vitamin E 180 MG (400 UNITS) capsule Take 400 Units by mouth daily.         Allergies:  Allergies  Allergen Reactions  . Rofecoxib     REACTION: reaction not known  . Tetanus-Diphtheria Toxoids Td     REACTION: sick with fever  . Antihistamines, Chlorpheniramine-Type Palpitations  . Sulfa Antibiotics Rash    Family History: Family History  Problem Relation Age of Onset  . Early death Father 6       train accident  . Diabetes Mother   . Heart disease Mother   . Diabetes Sister   . Diabetes Brother   . Colon cancer Brother   . Cancer Paternal Grandmother        unknown  . Diabetes Sister     Social History:  reports that she has never smoked. She has never used smokeless tobacco. She reports that she does not drink alcohol and does not use drugs.   Physical Exam: BP (!) 154/89   Pulse 80   Constitutional:  Alert and oriented, No  acute distress. HEENT: [place] AT, moist mucus membranes.  Trachea midline, no masses. Cardiovascular: No clubbing, cyanosis, or edema. Respiratory: Normal respiratory effort, no increased work of breathing. Skin: No rashes, bruises or suspicious lesions. Neurologic: Grossly intact, no focal deficits, moving all 4 extremities. Psychiatric: Normal mood and affect.  Laboratory Data:  Lab Results  Component Value Date   CREATININE 0.92 10/02/2019    Lab Results  Component Value Date   HGBA1C 7.4 (H) 10/02/2019     Pertinent Imaging: Results for orders placed or performed in visit on 01/15/20  BLADDER SCAN AMB NON-IMAGING  Result Value Ref Range   Scan Result 0 ML     Assessment & Plan:    1. Recurrent UTI/chronic bacterial colonization No infection since July, currently asymptomatic in terms of burning and UTI specific symptoms  2.  OAB/urgency/urge incontinence Symptoms are severe and very bothersome Bladder scan by PVR is 0 mL. Minimal improvement on Myrbetriq 50 mg.  Second line therapies were discussed today in detail including Botox versus PTNS versus InterStim Discussed botox injections  along with the benefits and risk of infections, hematuria, dysuria and urinary retention( approximately 5%). Patient is interested in injections and was provided with literature. We will plan for the initial procedure in the operating room and if she is pleased with this result, we will consider subsequent injections in the office thereafter  2 History of nephrolithiasis S/p left ureteroscopy and laser lithotripsy with Dr. Bernardo Heater on 04/01/19 Not interested in treating left-sided stone burden If she continues to have frequent Proteus infections, we will more strongly urged her to have her left-sided stone burden treated   [facility] [address] [place], [place] [address] [phone]  I, Selena Batten, am acting as a scribe for Dr. Hollice Espy.  I have reviewed the above documentation for accuracy and completeness, and I agree with the above.   Hollice Espy, MD

## 2020-01-15 ENCOUNTER — Other Ambulatory Visit: Payer: Self-pay

## 2020-01-15 ENCOUNTER — Ambulatory Visit: Payer: Medicare Other | Admitting: Urology

## 2020-01-15 VITALS — BP 154/89 | HR 80

## 2020-01-15 DIAGNOSIS — N390 Urinary tract infection, site not specified: Secondary | ICD-10-CM

## 2020-01-15 LAB — BLADDER SCAN AMB NON-IMAGING: Scan Result: 0

## 2020-01-16 ENCOUNTER — Other Ambulatory Visit: Payer: Self-pay | Admitting: Radiology

## 2020-01-16 DIAGNOSIS — N3941 Urge incontinence: Secondary | ICD-10-CM

## 2020-01-16 DIAGNOSIS — N3281 Overactive bladder: Secondary | ICD-10-CM

## 2020-01-16 MED ORDER — ONABOTULINUMTOXINA 100 UNITS IJ SOLR: 100.0000 [IU] | INTRAMUSCULAR | Status: AC

## 2020-01-20 ENCOUNTER — Other Ambulatory Visit: Payer: Self-pay

## 2020-01-20 ENCOUNTER — Encounter: Payer: Self-pay | Admitting: Family Medicine

## 2020-01-20 ENCOUNTER — Ambulatory Visit (INDEPENDENT_AMBULATORY_CARE_PROVIDER_SITE_OTHER): Payer: Medicare Other | Admitting: Family Medicine

## 2020-01-20 VITALS — BP 132/88 | HR 78 | Temp 97.2°F | Ht 61.0 in | Wt 214.2 lb

## 2020-01-20 DIAGNOSIS — E785 Hyperlipidemia, unspecified: Secondary | ICD-10-CM | POA: Diagnosis not present

## 2020-01-20 DIAGNOSIS — E119 Type 2 diabetes mellitus without complications: Secondary | ICD-10-CM

## 2020-01-20 DIAGNOSIS — I10 Essential (primary) hypertension: Secondary | ICD-10-CM | POA: Diagnosis not present

## 2020-01-20 DIAGNOSIS — Z23 Encounter for immunization: Secondary | ICD-10-CM

## 2020-01-20 DIAGNOSIS — E1169 Type 2 diabetes mellitus with other specified complication: Secondary | ICD-10-CM

## 2020-01-20 LAB — POCT GLYCOSYLATED HEMOGLOBIN (HGB A1C): Hemoglobin A1C: 7.1 % — AB (ref 4.0–5.6)

## 2020-01-20 NOTE — Assessment & Plan Note (Signed)
Disc goals for lipids and reasons to control them Rev last labs with pt Rev low sat fat diet in detail LDL at goal with simvastatin 40 mg daily

## 2020-01-20 NOTE — Assessment & Plan Note (Signed)
A little improvement with diet change Lab Results  Component Value Date   HGBA1C 7.1 (A) 01/20/2020   Goal is under 7 F/u planned for April/annual exam  On metformin and glipizide Disc low glycemic diet and addn of more exercise  Eye /foot care are good  On statin and ace

## 2020-01-20 NOTE — Patient Instructions (Addendum)
Flu shot today   Work on safe activity and exercise -work up to 30 minutes per day  Add 10 minutes of walking per day (just for the sake of walking) to start out  Consider a pedaler to use under your desk for added exercise  The more you move -the easier it should be   A1C is down to 7.1 (from 7.4)  Goal is under 7  Please work on diet  Try to get most of your carbohydrates from produce (with the exception of white potatoes)  Eat less bread/pasta/rice/snack foods/cereals/sweets and other items from the middle of the grocery store (processed carbs)  Schedule annual exam in April please

## 2020-01-20 NOTE — Progress Notes (Signed)
Subjective:    Patient ID: Gabriella Howe, female    DOB: 09-Jan-1935, 85 y.o.   MRN: [id number]  This visit occurred during the SARS-CoV-2 public health emergency.  Safety protocols were in place, including screening questions prior to the visit, additional usage of staff PPE, and extensive cleaning of exam room while observing appropriate contact time as indicated for disinfecting solutions.    HPI Pt presents for f/u of chronic medical problems and also a flu shot   Wt Readings from Last 3 Encounters:  01/20/20 214 lb 3 oz (97.2 kg)  12/17/19 216 lb 3.2 oz (98.1 kg)  11/04/19 208 lb (94.3 kg)   40.47 kg/m   Hanging in there overall  Never feels great  Looking into botox injections for incontinence -next month   Is going for recurrent uti   Does make effort to drink enough fluids She likes bottled water with lemon in it    HTN  bp is stable today  No cp or palpitations or headaches or edema  No side effects to medicines  BP Readings from Last 3 Encounters:  01/20/20 (!) 146/82  01/15/20 (!) 154/89  12/17/19 (!) 181/101    Benazepril 20 mg daily  Lasix 40 mg daily   Re check BP: 132/88   Has not taken lasix yet today due to the urinary frequency At home her bp has gone up to 180 once    DM2 Lab Results  Component Value Date   HGBA1C 7.4 (H) 10/02/2019   Due for check today  Lab Results  Component Value Date   HGBA1C 7.1 (A) 01/20/2020    Diet was not optimal , thinks it is better now  Has cut out the coffee/cookie in evening for 2 weeks   She eats bread with sandwiches (1 pc at a time-open faced)  No sweet drinks  (diet green tea)  Cut out sodas  occ eats sweets-not often   Activity level -not a whole lot  Hard for her to get up and down and walk - needs to move more  Uses stairs/ feeds birds  Just ordered new shoes to make it easier   Work is busy lately -sitting at a computer     She declined more medication  Takes ace and statin    Glipizide xl 10 mg daily Metformin 1000 mg bid   Eye exam was 7/21 No problems with foot care    Hyperlipidemia Lab Results  Component Value Date   CHOL 117 10/02/2019   HDL 50.70 10/02/2019   LDLCALC 45 10/02/2019   TRIG 107.0 10/02/2019   CHOLHDL 2 10/02/2019   Taking simvastatin 40 mg daily   Needs flu shot Is covid immunized    Patient Active Problem List   Diagnosis Date Noted  . Frequent UTI 09/03/2019  . Acute cystitis 08/19/2019  . Pedal edema 08/05/2019  . DJD (degenerative joint disease) 02/26/2019  . Iron deficiency anemia 04/30/2017  . History of shingles 10/03/2016  . Routine general medical examination at a health care facility 04/14/2015  . Back pain 07/28/2014  . Knee pain 07/28/2014  . Incomplete emptying of bladder 11/28/2012  . Frequency of urination 11/28/2012  . Encounter for Medicare annual wellness exam 11/19/2012  . History of kidney stones 11/19/2012  . DIASTOLIC HEART FAILURE, CHRONIC 07/29/2009  . Hyperlipidemia associated with type 2 diabetes mellitus ([place]) 09/13/2006  . Morbid obesity ([place]) 09/10/2006  . CARPAL TUNNEL SYNDROME 09/10/2006  . Essential hypertension 09/10/2006  .  DIASTOLIC DYSFUNCTION 89/38/1017  . REACTIVE AIRWAY DISEASE 09/10/2006  . GERD 09/10/2006  . ROSACEA 09/10/2006  . Degeneration of lumbar or lumbosacral intervertebral disc 09/10/2006  . OSA (obstructive sleep apnea) 09/10/2006  . COLONIC POLYPS, HX OF 09/10/2006  . Diabetes type 2, controlled ([place]) 09/06/2006   Past Medical History:  Diagnosis Date  . Adenomatous colon polyp 2003  . Benign neoplasm of colon   . Carpal tunnel syndrome   . Chest pain, unspecified   . CHF (congestive heart failure) (HCC)    diastolic  . Degeneration of intervertebral disc, site unspecified   . Diabetes mellitus    type II  . Encounter for long-term (current) use of other medications   . Fluid overload   . GERD (gastroesophageal reflux disease)   . Heart disease,  unspecified   . Hyperlipidemia   . Hypertension   . Kidney stone 3/12   hosp/ with stent  . Obesity, unspecified   . Rosacea   . Shortness of breath   . Unspecified asthma(493.90)   . Unspecified sleep apnea    Past Surgical History:  Procedure Laterality Date  . ABDOMINAL HYSTERECTOMY     partial, fibroids  . CYSTOSCOPY WITH STENT PLACEMENT Left 02/18/2019   Procedure: CYSTOSCOPY WITH STENT PLACEMENT;  Surgeon: Abbie Sons, MD;  Location: [facility];  Service: Urology;  Laterality: Left;  . CYSTOSCOPY/URETEROSCOPY/HOLMIUM LASER/STENT PLACEMENT Left 04/01/2019   Procedure: CYSTOSCOPY/URETEROSCOPY/HOLMIUM LASER/STENT PLACEMENT;  Surgeon: Abbie Sons, MD;  Location: [facility];  Service: Urology;  Laterality: Left;  . ESOPHAGOGASTRODUODENOSCOPY  2000  . UMBILICAL HERNIA REPAIR  01/2004  . URETERAL STENT PLACEMENT  2/13   for L sided stone/ ecoli sepsis   Social History   Tobacco Use  . Smoking status: Never Smoker  . Smokeless tobacco: Never Used  Vaping Use  . Vaping Use: Never used  Substance Use Topics  . Alcohol use: No    Alcohol/week: 0.0 standard drinks  . Drug use: No   Family History  Problem Relation Age of Onset  . Early death Father 8       train accident  . Diabetes Mother   . Heart disease Mother   . Diabetes Sister   . Diabetes Brother   . Colon cancer Brother   . Cancer Paternal Grandmother        unknown  . Diabetes Sister    Allergies  Allergen Reactions  . Rofecoxib     REACTION: reaction not known  . Tetanus-Diphtheria Toxoids Td     REACTION: sick with fever  . Antihistamines, Chlorpheniramine-Type Palpitations  . Sulfa Antibiotics Rash   Current Outpatient Medications on File Prior to Visit  Medication Sig Dispense Refill  . Accu-Chek Softclix Lancets lancets USE AS DIRECTED TO CHECK BLOOD SUGAR ONCE DAILY AND AS NEEDED. (DX. E11.9) 100 each 0  . Ascorbic Acid (VITAMIN C) 500 MG tablet Take 500 mg by mouth daily.      .  Aspirin-Caffeine (BC FAST PAIN RELIEF PO) Take 1 packet by mouth daily as needed.     . benazepril (LOTENSIN) 20 MG tablet Take 1 tablet (20 mg total) by mouth daily. 90 tablet 3  . CALCIUM-VITAMIN D PO Take 1 tablet by mouth 2 (two) times daily.     . Cyanocobalamin (B-12 PO) Take 1 tablet by mouth daily.    . furosemide (LASIX) 40 MG tablet Take 1 tablet (40 mg total) by mouth daily. 14 tablet 0  . glipiZIDE (GLUCOTROL XL)  10 MG 24 hr tablet Take 1 tablet (10 mg total) by mouth daily with breakfast. 90 tablet 3  . glucose blood (ACCU-CHEK AVIVA PLUS) test strip USE TO CHECK BLOOD SUGAR ONCE DAILY AND AS NEEDED (DX. E11.9) 50 each 1  . metFORMIN (GLUCOPHAGE) 1000 MG tablet Take 1 tablet (1,000 mg total) by mouth 2 (two) times daily with a meal. 180 tablet 3  . Multiple Vitamin (MULTIVITAMIN) capsule Take 1 capsule by mouth daily.      . naproxen sodium (ALEVE) 220 MG tablet Take 220 mg by mouth daily as needed.    . Omega-3 Fatty Acids (FISH OIL PO) Take 1 capsule by mouth daily.     Marland Kitchen omeprazole (PRILOSEC) 20 MG capsule Take 1 capsule (20 mg total) by mouth daily. 90 capsule 3  . Pseudoephedrine HCl (SINUS & ALLERGY 12 HOUR PO) Take by mouth. Taking daily (combination product for allergy and sinus)    . simvastatin (ZOCOR) 40 MG tablet Take 1 tablet (40 mg total) by mouth at bedtime. 90 tablet 3  . triamcinolone cream (KENALOG) 0.1 % APPLY TO AFFECTED AREA(S)  TOPICALLY TWO TIMES A DAY 30 g 1  . vitamin E 400 UNIT capsule Take 400 Units by mouth daily.       No current facility-administered medications on file prior to visit.    Review of Systems  Constitutional: Positive for fatigue. Negative for activity change, appetite change, fever and unexpected weight change.  HENT: Negative for congestion, ear pain, rhinorrhea, sinus pressure and sore throat.   Eyes: Negative for pain, redness and visual disturbance.  Respiratory: Negative for cough, shortness of breath and wheezing.     Cardiovascular: Negative for chest pain and palpitations.  Gastrointestinal: Negative for abdominal pain, blood in stool, constipation and diarrhea.  Endocrine: Negative for polydipsia and polyuria.  Genitourinary: Negative for dysuria, frequency and urgency.  Musculoskeletal: Positive for arthralgias. Negative for back pain and myalgias.  Skin: Negative for pallor and rash.  Allergic/Immunologic: Negative for environmental allergies.  Neurological: Negative for dizziness, syncope and headaches.  Hematological: Negative for adenopathy. Does not bruise/bleed easily.  Psychiatric/Behavioral: Negative for decreased concentration and dysphoric mood. The patient is not nervous/anxious.        Objective:   Physical Exam Constitutional:      General: She is not in acute distress.    Appearance: Normal appearance. She is well-developed. She is obese. She is not ill-appearing or diaphoretic.  HENT:     Head: Normocephalic and atraumatic.  Eyes:     Conjunctiva/sclera: Conjunctivae normal.     Pupils: Pupils are equal, round, and reactive to light.  Neck:     Thyroid: No thyromegaly.     Vascular: No carotid bruit or JVD.  Cardiovascular:     Rate and Rhythm: Normal rate and regular rhythm.     Heart sounds: Normal heart sounds. No gallop.   Pulmonary:     Effort: Pulmonary effort is normal. No respiratory distress.     Breath sounds: Normal breath sounds. No wheezing or rales.  Abdominal:     General: Bowel sounds are normal. There is no distension or abdominal bruit.     Palpations: Abdomen is soft. There is no mass.     Tenderness: There is no abdominal tenderness.  Musculoskeletal:     Cervical back: Normal range of motion and neck supple.  Lymphadenopathy:     Cervical: No cervical adenopathy.  Skin:    General: Skin is warm and dry.  Coloration: Skin is not pale.     Findings: No erythema or rash.  Neurological:     Mental Status: She is alert.     Sensory: No sensory  deficit.     Coordination: Coordination normal.     Deep Tendon Reflexes: Reflexes are normal and symmetric. Reflexes normal.  Psychiatric:        Mood and Affect: Mood normal.           Assessment & Plan:   Problem List Items Addressed This Visit      Cardiovascular and Mediastinum   Essential hypertension    bp in fair control at this time  BP Readings from Last 1 Encounters:  01/20/20 132/88   No changes needed Most recent labs reviewed  Disc lifstyle change with low sodium diet and exercise  Plan to continue benazepril 20 mg daily as well as lasix 40 mg daily        Endocrine   Diabetes type 2, controlled ([place]) - Primary    A little improvement with diet change Lab Results  Component Value Date   HGBA1C 7.1 (A) 01/20/2020   Goal is under 7 F/u planned for April/annual exam  On metformin and glipizide Disc low glycemic diet and addn of more exercise  Eye /foot care are good  On statin and ace       Relevant Orders   POCT glycosylated hemoglobin (Hb A1C) (Completed)   Hyperlipidemia associated with type 2 diabetes mellitus ([place])    Disc goals for lipids and reasons to control them Rev last labs with pt Rev low sat fat diet in detail LDL at goal with simvastatin 40 mg daily        Other Visit Diagnoses    Need for influenza vaccination       Relevant Orders   Flu Vaccine QUAD High Dose(Fluad) (Completed)

## 2020-01-20 NOTE — Assessment & Plan Note (Signed)
bp in fair control at this time  BP Readings from Last 1 Encounters:  01/20/20 132/88   No changes needed Most recent labs reviewed  Disc lifstyle change with low sodium diet and exercise  Plan to continue benazepril 20 mg daily as well as lasix 40 mg daily

## 2020-01-29 ENCOUNTER — Other Ambulatory Visit: Payer: Self-pay | Admitting: *Deleted

## 2020-01-29 DIAGNOSIS — Z87440 Personal history of urinary (tract) infections: Secondary | ICD-10-CM

## 2020-01-30 ENCOUNTER — Encounter
Admission: RE | Admit: 2020-01-30 | Discharge: 2020-01-30 | Disposition: A | Discharge status: Home / Self Care | Payer: Medicare Other | Source: Ambulatory Visit | Attending: Urology | Admitting: Urology

## 2020-01-30 ENCOUNTER — Other Ambulatory Visit: Payer: Self-pay

## 2020-01-30 ENCOUNTER — Ambulatory Visit (INDEPENDENT_AMBULATORY_CARE_PROVIDER_SITE_OTHER): Payer: Medicare Other

## 2020-01-30 DIAGNOSIS — Z87440 Personal history of urinary (tract) infections: Secondary | ICD-10-CM

## 2020-01-30 DIAGNOSIS — R300 Dysuria: Secondary | ICD-10-CM | POA: Diagnosis not present

## 2020-01-30 DIAGNOSIS — Z01812 Encounter for preprocedural laboratory examination: Secondary | ICD-10-CM

## 2020-01-30 DIAGNOSIS — Z7689 Persons encountering health services in other specified circumstances: Secondary | ICD-10-CM | POA: Diagnosis not present

## 2020-01-30 HISTORY — DX: Other complications of anesthesia, initial encounter: T88.59XA

## 2020-01-30 HISTORY — DX: Personal history of urinary calculi: Z87.442

## 2020-01-30 LAB — URINALYSIS, COMPLETE
Bilirubin, UA: NEGATIVE
Glucose, UA: NEGATIVE
Ketones, UA: NEGATIVE
Nitrite, UA: POSITIVE — AB
Specific Gravity, UA: 1.02 (ref 1.005–1.030)
Urobilinogen, Ur: 0.2 mg/dL (ref 0.2–1.0)
pH, UA: 6 (ref 5.0–7.5)

## 2020-01-30 LAB — MICROSCOPIC EXAMINATION: WBC, UA: >30 /hpf — AB (ref 0–5)

## 2020-01-30 NOTE — Patient Instructions (Signed)
Your procedure is scheduled on: Monday 11/15  Report to the registration desk in the medical mall then to the 2nd floor surgery desk. To find out your arrival time please call [phone] between 1PM - 3PM on Friday 11/12.  Remember: Instructions that are not followed completely may result in serious medical risk,  up to and including death, or upon the discretion of your surgeon and anesthesiologist your  surgery may need to be rescheduled.     _X__ 1. Do not eat food after midnight the night before your procedure.                 No chewing gum or hard candies. You may drink clear liquids up to 2 hours                 before you are scheduled to arrive for your surgery- DO not drink clear                 liquids within 2 hours of the start of your surgery.                 Clear Liquids include:  water, G2 or                  Gatorade Zero  Black Coffee or Tea (Do not add                 anything to coffee or tea).  __X__2.  On the morning of surgery brush your teeth with toothpaste and water, you                may rinse your mouth with mouthwash if you wish.  Do not swallow any toothpaste of mouthwash.     ___ 3.  No Alcohol for 24 hours before or after surgery.   ___ 4.  Do Not Smoke or use e-cigarettes For 24 Hours Prior to Your Surgery.                 Do not use any chewable tobacco products for at least 6 hours prior to                 Surgery.  ___  5.  Do not use any recreational drugs (marijuana, cocaine, heroin, ecstasy, MDMA or other)                For at least one week prior to your surgery.  Combination of these drugs with anesthesia                May have life threatening results.  ____  6.  Bring all medications with you on the day of surgery if instructed.   __x__  7.  Notify your doctor if there is any change in your medical condition      (cold, fever, infections).     Do not wear jewelry, make-up, hairpins, clips or nail polish. Do  not wear lotions, powders, or perfumes. You may wear deodorant. Do not shave 48 hours prior to surgery.  Do not bring valuables to the hospital.    [facility] is not responsible for any belongings or valuables.  Contacts, dentures or bridgework may not be worn into surgery. Leave your suitcase in the car. After surgery it may be brought to your room. For patients admitted to the hospital, discharge time is determined by your treatment team.   Patients discharged the day of surgery will not be allowed  to drive home.   Make arrangements for someone to be with you for the first 24 hours of your Same Day Discharge.    Please read over the following fact sheets that you were given:       __x__ Take these medicines the morning of surgery with A SIP OF WATER:    1.omeprazole (PRILOSEC) 20 MG capsule night before and morning of surgery   2. Pseudoephedrine HCl (SINUS & ALLERGY 12 HOUR PO)  3.   4.  5.  6.  ____ Fleet Enema (as directed)   _x__    Shower the night before and morning of surgery using you usus al soap and shampoo  ____ Use Benzoyl Peroxide Gel as instructed  ____ Use inhalers on the day of surgery  _x___ Stop metformin 2 days prior to surgery  Last dose on 11/12    ____ Take 1/2 of usual insulin dose the night before surgery. No insulin the morning          of surgery.   ____ Stop Coumadin/Plavix/aspirin on   __x__ Stop Anti-inflammatories Aspirin-Caffeine (BC FAST PAIN RELIEF PO,naproxen sodium (ALEVE) 220 MG tablet and ibuprofen on 11/8    __x _ Stop supplements until after surgery.  Ascorbic Acid (VITAMIN C) 500 MG tablet, Omega-3 Fatty Acids (FISH OIL PO)vitamin E 400 UNIT capsule on Monday 11/ 8    May continue other vitamins.  Do not take the morning of surgery  ____ Bring C-Pap to the hospital.    If you have any questions regarding your pre-procedure instructions,  Please call Pre-admit Testing at [place]

## 2020-01-30 NOTE — Progress Notes (Signed)
In and Out Catheterization  Patient is present today for a I & O catheterization due to the inability to void for urine specimen. Patient was cleaned and prepped in a sterile fashion with betadine. A 12FR cath was inserted no complications were noted, 35ml of urine return was noted, urine was dark yellow in color and clear. A clean urine sample was collected for urinalysis. Bladder was drained  And catheter was removed with out difficulty.    Preformed by: Gordy Clement, cMA   Follow up/ Additional notes: Will call with results.

## 2020-02-02 ENCOUNTER — Encounter
Admission: RE | Admit: 2020-02-02 | Discharge: 2020-02-02 | Disposition: A | Discharge status: Home / Self Care | Payer: Medicare Other | Source: Ambulatory Visit | Attending: Urology | Admitting: Urology

## 2020-02-02 ENCOUNTER — Other Ambulatory Visit: Payer: Self-pay

## 2020-02-02 DIAGNOSIS — R54 Age-related physical debility: Secondary | ICD-10-CM | POA: Diagnosis not present

## 2020-02-02 DIAGNOSIS — I509 Heart failure, unspecified: Secondary | ICD-10-CM | POA: Insufficient documentation

## 2020-02-02 DIAGNOSIS — I10 Essential (primary) hypertension: Secondary | ICD-10-CM | POA: Diagnosis not present

## 2020-02-02 DIAGNOSIS — Z01818 Encounter for other preprocedural examination: Secondary | ICD-10-CM | POA: Insufficient documentation

## 2020-02-02 DIAGNOSIS — I110 Hypertensive heart disease with heart failure: Secondary | ICD-10-CM | POA: Diagnosis not present

## 2020-02-02 LAB — CBC
HCT: 40.3 % (ref 36.0–46.0)
Hemoglobin: 13.3 g/dL (ref 12.0–15.0)
MCH: 30.4 pg (ref 26.0–34.0)
MCHC: 33 g/dL (ref 30.0–36.0)
MCV: 92 fL (ref 80.0–100.0)
Platelets: 203 10*3/uL (ref 150–400)
RBC: 4.38 MIL/uL (ref 3.87–5.11)
RDW: 13 % (ref 11.5–15.5)
WBC: 6.5 10*3/uL (ref 4.0–10.5)
nRBC: 0 % (ref 0.0–0.2)

## 2020-02-02 LAB — BASIC METABOLIC PANEL WITH GFR
Anion gap: 11 (ref 5–15)
BUN: 28 mg/dL — ABNORMAL HIGH (ref 8–23)
CO2: 24 mmol/L (ref 22–32)
Calcium: 9.6 mg/dL (ref 8.9–10.3)
Chloride: 105 mmol/L (ref 98–111)
Creatinine, Ser: 0.77 mg/dL (ref 0.44–1.00)
GFR, Estimated: >60 mL/min
Glucose, Bld: 151 mg/dL — ABNORMAL HIGH (ref 70–99)
Potassium: 3.9 mmol/L (ref 3.5–5.1)
Sodium: 140 mmol/L (ref 135–145)

## 2020-02-05 ENCOUNTER — Other Ambulatory Visit
Admission: RE | Admit: 2020-02-05 | Discharge: 2020-02-05 | Disposition: A | Discharge status: Home / Self Care | Payer: Medicare Other | Source: Ambulatory Visit | Attending: Urology | Admitting: Urology

## 2020-02-05 ENCOUNTER — Other Ambulatory Visit: Payer: Self-pay

## 2020-02-05 ENCOUNTER — Telehealth: Payer: Self-pay | Admitting: Radiology

## 2020-02-05 ENCOUNTER — Other Ambulatory Visit: Payer: Self-pay | Admitting: *Deleted

## 2020-02-05 DIAGNOSIS — Z01812 Encounter for preprocedural laboratory examination: Secondary | ICD-10-CM | POA: Diagnosis not present

## 2020-02-05 DIAGNOSIS — N390 Urinary tract infection, site not specified: Secondary | ICD-10-CM

## 2020-02-05 DIAGNOSIS — Z20822 Contact with and (suspected) exposure to covid-19: Secondary | ICD-10-CM | POA: Insufficient documentation

## 2020-02-05 LAB — CULTURE, URINE COMPREHENSIVE

## 2020-02-05 MED ORDER — CEPHALEXIN 500 MG PO CAPS: 500.0000 mg | ORAL_CAPSULE | Freq: Four times a day (QID) | ORAL | 0 refills | Status: AC

## 2020-02-05 MED ORDER — FUROSEMIDE 40 MG PO TABS
40.0000 mg | ORAL_TABLET | Freq: Every day | ORAL | 1 refills | Status: DC
Start: 2020-02-05 — End: 2020-06-21

## 2020-02-05 NOTE — Telephone Encounter (Signed)
Unable to reach patient by phone. Notified daughter per DPR of positive urine culture & script sent to pharmacy.

## 2020-02-05 NOTE — Telephone Encounter (Signed)
-----   Message from Hollice Espy, MD sent at 02/05/2020  1:21 PM EST ----- Preoperative urine culture positive for Proteus.  I like to treat her with Keflex 500 mg 4 times a day starting tomorrow for total of a 5-day course.  Hollice Espy, MD

## 2020-02-06 LAB — SARS CORONAVIRUS 2 (TAT 6-24 HRS): SARS Coronavirus 2: NEGATIVE

## 2020-02-09 ENCOUNTER — Encounter
Admission: RE | Disposition: A | Discharge status: Home / Self Care | Payer: Self-pay | Source: Home / Self Care | Attending: Urology

## 2020-02-09 ENCOUNTER — Ambulatory Visit
Admission: RE | Admit: 2020-02-09 | Discharge: 2020-02-09 | Disposition: A | Discharge status: Home / Self Care | Payer: Medicare Other | Attending: Urology | Admitting: Urology

## 2020-02-09 ENCOUNTER — Other Ambulatory Visit: Payer: Self-pay

## 2020-02-09 ENCOUNTER — Ambulatory Visit: Payer: Medicare Other | Admitting: Certified Registered"

## 2020-02-09 ENCOUNTER — Encounter: Payer: Self-pay | Admitting: Urology

## 2020-02-09 DIAGNOSIS — I5030 Unspecified diastolic (congestive) heart failure: Secondary | ICD-10-CM | POA: Diagnosis not present

## 2020-02-09 DIAGNOSIS — N3941 Urge incontinence: Secondary | ICD-10-CM | POA: Diagnosis not present

## 2020-02-09 DIAGNOSIS — N3281 Overactive bladder: Secondary | ICD-10-CM | POA: Diagnosis not present

## 2020-02-09 DIAGNOSIS — Z87442 Personal history of urinary calculi: Secondary | ICD-10-CM | POA: Diagnosis not present

## 2020-02-09 DIAGNOSIS — Z87440 Personal history of urinary (tract) infections: Secondary | ICD-10-CM | POA: Insufficient documentation

## 2020-02-09 DIAGNOSIS — Z887 Allergy status to serum and vaccine status: Secondary | ICD-10-CM | POA: Diagnosis not present

## 2020-02-09 DIAGNOSIS — Z882 Allergy status to sulfonamides status: Secondary | ICD-10-CM | POA: Diagnosis not present

## 2020-02-09 DIAGNOSIS — Z888 Allergy status to other drugs, medicaments and biological substances status: Secondary | ICD-10-CM | POA: Diagnosis not present

## 2020-02-09 DIAGNOSIS — I110 Hypertensive heart disease with heart failure: Secondary | ICD-10-CM | POA: Diagnosis not present

## 2020-02-09 DIAGNOSIS — E119 Type 2 diabetes mellitus without complications: Secondary | ICD-10-CM | POA: Diagnosis not present

## 2020-02-09 HISTORY — PX: BOTOX INJECTION: SHX5754

## 2020-02-09 LAB — GLUCOSE, CAPILLARY
Glucose-Capillary: 187 mg/dL — ABNORMAL HIGH (ref 70–99)
Glucose-Capillary: 207 mg/dL — ABNORMAL HIGH (ref 70–99)

## 2020-02-09 SURGERY — BOTOX INJECTION
Anesthesia: General | Site: Bladder

## 2020-02-09 MED ORDER — CEFAZOLIN SODIUM-DEXTROSE 2-4 GM/100ML-% IV SOLN
2.0000 g | INTRAVENOUS | Status: AC
  Administered 2020-02-09: 2 g via INTRAVENOUS

## 2020-02-09 MED ORDER — SODIUM CHLORIDE (PF) 0.9 % IJ SOLN
INTRAMUSCULAR | Status: AC
  Filled 2020-02-09: qty 100

## 2020-02-09 MED ORDER — FENTANYL CITRATE (PF) 100 MCG/2ML IJ SOLN: 25.0000 ug | INTRAMUSCULAR | Status: DC | PRN

## 2020-02-09 MED ORDER — FENTANYL CITRATE (PF) 100 MCG/2ML IJ SOLN
INTRAMUSCULAR | Status: AC
  Filled 2020-02-09: qty 2

## 2020-02-09 MED ORDER — ORAL CARE MOUTH RINSE: 15.0000 mL | Freq: Once | OROMUCOSAL | Status: AC

## 2020-02-09 MED ORDER — PROPOFOL 10 MG/ML IV BOLUS
INTRAVENOUS | Status: DC | PRN
  Administered 2020-02-09: 30 mg via INTRAVENOUS
  Administered 2020-02-09: 20 mg via INTRAVENOUS

## 2020-02-09 MED ORDER — SODIUM CHLORIDE (PF) 0.9 % IJ SOLN
INTRAMUSCULAR | Status: DC | PRN
  Administered 2020-02-09: 10 mL

## 2020-02-09 MED ORDER — CHLORHEXIDINE GLUCONATE 0.12 % MT SOLN
15.0000 mL | Freq: Once | OROMUCOSAL | Status: AC
  Administered 2020-02-09: 15 mL via OROMUCOSAL

## 2020-02-09 MED ORDER — SODIUM CHLORIDE 0.9 % IV SOLN: INTRAVENOUS | Status: DC

## 2020-02-09 MED ORDER — ONABOTULINUMTOXINA 100 UNITS IJ SOLR
INTRAMUSCULAR | Status: DC | PRN
  Administered 2020-02-09: 100 [IU] via INTRAMUSCULAR

## 2020-02-09 MED ORDER — FENTANYL CITRATE (PF) 100 MCG/2ML IJ SOLN
INTRAMUSCULAR | Status: DC | PRN
  Administered 2020-02-09 (×2): 50 ug via INTRAVENOUS

## 2020-02-09 MED ORDER — ONDANSETRON HCL 4 MG/2ML IJ SOLN
INTRAMUSCULAR | Status: DC | PRN
  Administered 2020-02-09: 4 mg via INTRAVENOUS

## 2020-02-09 MED ORDER — CHLORHEXIDINE GLUCONATE 0.12 % MT SOLN
OROMUCOSAL | Status: AC
  Filled 2020-02-09: qty 15

## 2020-02-09 MED ORDER — CEFAZOLIN SODIUM-DEXTROSE 2-4 GM/100ML-% IV SOLN
INTRAVENOUS | Status: AC
  Filled 2020-02-09: qty 100

## 2020-02-09 MED ORDER — PROPOFOL 500 MG/50ML IV EMUL
INTRAVENOUS | Status: AC
  Filled 2020-02-09: qty 150

## 2020-02-09 MED ORDER — DEXAMETHASONE SODIUM PHOSPHATE 10 MG/ML IJ SOLN
INTRAMUSCULAR | Status: DC | PRN
  Administered 2020-02-09: 10 mg via INTRAVENOUS

## 2020-02-09 MED ORDER — ONDANSETRON HCL 4 MG/2ML IJ SOLN: 4.0000 mg | Freq: Once | INTRAMUSCULAR | Status: DC | PRN

## 2020-02-09 MED ORDER — PROPOFOL 500 MG/50ML IV EMUL
INTRAVENOUS | Status: DC | PRN
  Administered 2020-02-09: 100 ug/kg/min via INTRAVENOUS

## 2020-02-09 SURGICAL SUPPLY — 18 items
BAG DRAIN CYSTO-URO LG1000N (MISCELLANEOUS) ×3 IMPLANT
GLOVE BIO SURGEON STRL SZ 6.5 (GLOVE) ×2 IMPLANT
GLOVE BIO SURGEONS STRL SZ 6.5 (GLOVE) ×1
GOWN STRL REUS W/ TWL LRG LVL3 (GOWN DISPOSABLE) ×2 IMPLANT
GOWN STRL REUS W/TWL LRG LVL3 (GOWN DISPOSABLE) ×6
MANIFOLD NEPTUNE II (INSTRUMENTS) ×3 IMPLANT
NDL INJETAK FLEX 70CM BOTOX (NEEDLE) ×1 IMPLANT
NDL SAFETY ECLIPSE 18X1.5 (NEEDLE) ×2 IMPLANT
NEEDLE HYPO 18GX1.5 SHARP (NEEDLE) ×6
NEEDLE INJETAK FLEX 70CM BOTOX (NEEDLE) ×3 IMPLANT
PACK CYSTO AR (MISCELLANEOUS) ×3 IMPLANT
SET CYSTO W/LG BORE CLAMP LF (SET/KITS/TRAYS/PACK) ×3 IMPLANT
SOL .9 NS 3000ML IRR  AL (IV SOLUTION) ×3
SOL .9 NS 3000ML IRR AL (IV SOLUTION) ×1
SOL .9 NS 3000ML IRR UROMATIC (IV SOLUTION) ×1 IMPLANT
SURGILUBE 2OZ TUBE FLIPTOP (MISCELLANEOUS) ×3 IMPLANT
SYR 10ML LL (SYRINGE) ×6 IMPLANT
WATER STERILE IRR 1000ML POUR (IV SOLUTION) ×3 IMPLANT

## 2020-02-09 NOTE — Discharge Instructions (Addendum)
Botulinum Toxin Bladder Injection  A botulinum toxin bladder injection is a procedure to treat an overactive bladder. During the procedure, a drug called botulinum toxin is injected into the bladder through a long, thin needle. This drug relaxes the bladder muscles and reduces overactivity. You may need this procedure if your medicines are not working or you cannot take them. The procedure may be repeated as needed. The treatment usually lasts for 6 months. Your health care provider will monitor you to see how well you respond. Tell a health care provider about:  Any allergies you have.  All medicines you are taking, including vitamins, herbs, eye drops, creams, and over-the-counter medicines.  Any problems you or family members have had with anesthetic medicines.  Any blood disorders you have.  Any surgeries you have had.  Any medical conditions you have.  Any previous reactions to a botulinum toxin injection.  Any symptoms of urinary tract infection. These include chills, fever, a burning feeling when passing urine, and needing to pass urine often.  Whether you are pregnant or may be pregnant. What are the risks? Generally this is a safe procedure. However, problems may occur, including:  Not being able to pass urine. If this happens, you may need to have your bladder emptied with a thin tube inserted into your urethra (urinary catheter).  Bleeding.  Urinary tract infection.  Allergic reaction to the botulinum toxin.  Pain or burning when passing urine.  Damage to other structures or organs. What happens before the procedure? Staying hydrated Follow instructions from your health care provider about hydration, which may include:  Up to 2 hours before the procedure - you may continue to drink clear liquids, such as water, clear fruit juice, black coffee, and plain tea. Eating and drinking restrictions Follow instructions from your health care provider about eating and  drinking, which may include:  8 hours before the procedure - stop eating heavy meals or foods, such as meat, fried foods, or fatty foods.  6 hours before the procedure - stop eating light meals or foods, such as toast or cereal.  6 hours before the procedure - stop drinking milk or drinks that contain milk.  2 hours before the procedure - stop drinking clear liquids. Medicines Ask your health care provider about:  Changing or stopping your regular medicines. This is especially important if you are taking diabetes medicines or blood thinners.  Taking medicines such as aspirin and ibuprofen. These medicines can thin your blood. Do not take these medicines unless your health care provider tells you to take them.  Taking over-the-counter medicines, vitamins, herbs, and supplements. General instructions  Plan to have someone take you home from the hospital or clinic.  If you will be going home right after the procedure, plan to have someone with you for 24 hours.  Ask your health care provider what steps will be taken to help prevent infection. These may include: ? Removing hair at the procedure site. ? Washing skin with a germ-killing soap. ? Antibiotic medicine. What happens during the procedure?   You will be asked to empty your bladder.  An IV will be inserted into one of your veins.  You will be given one or more of the following: ? A medicine to help you relax (sedative). ? A medicine to numb the area (local anesthetic). ? A medicine to make you fall asleep (general anesthetic).  A long, thin scope called a cystoscope will be passed into your bladder through the part  of the body that carries urine from your bladder (urethra).  The cystoscope will be used to fill your bladder with water.  A long needle will be passed through the cystoscope and into the bladder.  The botulinum toxin will be injected into your bladder. It may be injected into multiple areas of your  bladder.  Your bladder will be emptied, and the cystoscope will be removed. The procedure may vary among health care providers and hospitals. What can I expect after procedure? After your procedure, it is common to have:  Blood-tinged urine.  Burning or soreness when you pass urine. Follow these instructions at home: Medicines  Take over-the-counter and prescription medicines only as told by your health care provider.  If you were prescribed an antibiotic medicine, take it as told by your health care provider. Do not stop taking the antibiotic even if you start to feel better. General instructions   Do not drive for 24 hours if you were given a sedative during your procedure.  Drink enough fluid to keep your urine pale yellow.  Return to your normal activities as told by your health care provider. Ask your health care provider what activities are safe for you.  Keep all follow-up visits as told by your health care provider. This is important. Contact a health care provider if you have:  A fever or chills.  Blood-tinged urine for more than one day after your procedure.  Worsening pain or burning when you pass urine.  Pain or burning when passing urine for more than two days after your procedure.  Trouble emptying your bladder. Get help right away if you:  Have bright red blood in your urine.  Are unable to pass urine. Summary  A botulinum toxin bladder injection is a procedure to treat an overactive bladder.  This is generally a very safe procedure. However, problems may occur, including not being able to pass urine, bleeding, infection, pain, and allergic reactions to medicines.  You will be told when to stop eating and drinking, and what medicines to change or stop. Follow instructions carefully.  After the procedure, it is common to have blood in urine and to have soreness or burning when passing urine.  Contact a health care provider if you have a fever, have  blood in urine for more than a few days, or have trouble passing urine. Get help right away if you have bright red blood in the urine, or if you are unable to pass urine. This information is not intended to replace advice given to you by your health care provider. Make sure you discuss any questions you have with your health care provider. Document Revised: 09/21/2017 Document Reviewed: 09/21/2017 Elsevier Patient Education  2020 [place]   1) The drugs that you were given will stay in your system until tomorrow so for the next 24 hours you should not:  A) Drive an automobile B) Make any legal decisions C) Drink any alcoholic beverage   2) You may resume regular meals tomorrow.  Today it is better to start with liquids and gradually work up to solid foods.  You may eat anything you prefer, but it is better to start with liquids, then soup and crackers, and gradually work up to solid foods.   3) Please notify your doctor immediately if you have any unusual bleeding, trouble breathing, redness and pain at the surgery site, drainage, fever, or pain not relieved by medication.  4) Additional Instructions:        Please contact your physician with any problems or Same Day Surgery at [phone], Monday through Friday 6 am to 4 pm, or [place] at [facility] number at [phone].   AMBULATORY SURGERY  DISCHARGE INSTRUCTIONS   5) The drugs that you were given will stay in your system until tomorrow so for the next 24 hours you should not:  D) Drive an automobile E) Make any legal decisions F) Drink any alcoholic beverage   6) You may resume regular meals tomorrow.  Today it is better to start with liquids and gradually work up to solid foods.  You may eat anything you prefer, but it is better to start with liquids, then soup and crackers, and gradually work up to solid foods.   7) Please notify your doctor  immediately if you have any unusual bleeding, trouble breathing, redness and pain at the surgery site, drainage, fever, or pain not relieved by medication.    8) Additional Instructions:        Please contact your physician with any problems or Same Day Surgery at [phone], Monday through Friday 6 am to 4 pm, or  at [facility] number at [phone].

## 2020-02-09 NOTE — Transfer of Care (Signed)
Immediate Anesthesia Transfer of Care Note  Patient: Gabriella Howe  Procedure(s) Performed: BOTOX INJECTION (N/A Bladder)  Patient Location: PACU  Anesthesia Type:General  Level of Consciousness: awake, drowsy and patient cooperative  Airway & Oxygen Therapy: Patient Spontanous Breathing and Patient connected to face mask oxygen  Post-op Assessment: Report given to RN and Post -op Vital signs reviewed and stable  Post vital signs: Reviewed and stable  Last Vitals:  Vitals Value Taken Time  BP 128/71 02/09/20 0808  Temp    Pulse 77 02/09/20 0810  Resp 14 02/09/20 0810  SpO2 98 % 02/09/20 0810  Vitals shown include unvalidated device data.  Last Pain:  Vitals:   02/09/20 0808  TempSrc:   PainSc: (P) Asleep      Patients Stated Pain Goal: 0 (02/63/78 5885)  Complications: No complications documented.

## 2020-02-09 NOTE — Op Note (Signed)
Date of procedure: 02/09/20  Preoperative diagnosis:  1. Refractory OAB  Postoperative diagnosis:  1. Same as above  Procedure: 1. Cystoscopy 2. Intravesical injection of botulinum toxin  Surgeon: Hollice Espy, MD  Anesthesia: General  Complications: None  Intraoperative findings: Uncomplicated procedure, 711 U  EBL: Minimal  Specimens: None  Drains: None  Indication: Gabriella Howe is a 85 y.o. patient with refractory OAB who has failed oral meds.  After reviewing the management options for treatment, the patient elected to proceed with the above surgical procedure(s). We have discussed the potential benefits and risks of the procedure, side effects of the proposed treatment, the likelihood of the patient achieving the goals of the procedure, and any potential problems that might occur during the procedure or recuperation. Informed consent has been obtained.  Description of procedure:  The patient was taken to the operating room and general anesthesia was induced.  The patient was placed in the dorsal lithotomy position, prepped and draped in the usual sterile fashion, and preoperative antibiotics were administered. A preoperative time-out was performed.   A 21 French rigid cystoscope was advanced per urethra into the bladder.  The bladder was carefully inspected and showed mild resolving diffuse cystitis for which she is being treated. At this point time, a total of 100 units of botulinum toxin was injected into the muscularis propria of the bladder in a total of 20 0.5 cc injections into [name] across the posterior bladder wall.  The procedure was uncomplicated.  Hemostasis was excellent.  The bladder was drained and the scope was removed.  The patient was then clean and dry, repositioned the supine position, reversed by anesthesia, and taken to the PACU in stable condition  Plan: Patient will follow up in a week with a PA  for PVR then in 6 weeks with me to assess effecacy.    Hollice Espy, M.D.

## 2020-02-09 NOTE — Anesthesia Postprocedure Evaluation (Signed)
Anesthesia Post Note  Patient: Gabriella Howe  Procedure(s) Performed: BOTOX INJECTION (N/A Bladder)  Patient location during evaluation: PACU Anesthesia Type: General Level of consciousness: awake and alert Pain management: pain level controlled Vital Signs Assessment: post-procedure vital signs reviewed and stable Respiratory status: spontaneous breathing, nonlabored ventilation, respiratory function stable and patient connected to nasal cannula oxygen Cardiovascular status: blood pressure returned to baseline and stable Postop Assessment: no apparent nausea or vomiting Anesthetic complications: no   No complications documented.   Last Vitals:  Vitals:   02/09/20 0846 02/09/20 0902  BP: (!) 158/74 (!) 176/80  Pulse: 92 79  Resp: 16 16  Temp: (!) 36.1 C   SpO2: 95% 96%    Last Pain:  Vitals:   02/09/20 0902  TempSrc:   PainSc: 2                  Martha Clan

## 2020-02-09 NOTE — Interval H&P Note (Signed)
History and Physical Interval Note:  02/09/2020 7:21 AM  Gabriella Howe  has presented today for surgery, with the diagnosis of overactive bladder, urge incontinence.  The various methods of treatment have been discussed with the patient and family. After consideration of risks, benefits and other options for treatment, the patient has consented to  Procedure(s): BOTOX INJECTION (N/A) as a surgical intervention.  The patient's history has been reviewed, patient examined, no change in status, stable for surgery.  I have reviewed the patient's chart and labs.  Questions were answered to the patient's satisfaction.    RRR CTAB  Preop UCx +, treated with keflex  Hollice Espy

## 2020-02-09 NOTE — Progress Notes (Addendum)
Pt CBG: 207. Dr. Rosey Bath notified. Acknowledged. No new orders at this time.

## 2020-02-09 NOTE — Anesthesia Procedure Notes (Signed)
Procedure Name: General with mask airway Performed by: Fletcher-Harrison, Ziquan Fidel, CRNA Pre-anesthesia Checklist: Patient identified, Emergency Drugs available, Suction available and Patient being monitored Patient Re-evaluated:Patient Re-evaluated prior to induction Oxygen Delivery Method: Simple face mask Induction Type: IV induction Placement Confirmation: positive ETCO2 and CO2 detector Dental Injury: Teeth and Oropharynx as per pre-operative assessment        

## 2020-02-09 NOTE — Anesthesia Preprocedure Evaluation (Addendum)
Anesthesia Evaluation  Patient identified by MRN, date of birth, ID band Patient awake    Reviewed: Allergy & Precautions, NPO status , Patient's Chart, lab work & pertinent test results  History of Anesthesia Complications Negative for: history of anesthetic complications  Airway Mallampati: II  TM Distance: >3 FB Neck ROM: Full    Dental  (+) Partial Upper   Pulmonary neg shortness of breath, sleep apnea (sleeps in a chair, does not wear CPAP) , neg COPD, neg recent URI,    breath sounds clear to auscultation- rhonchi (-) wheezing      Cardiovascular hypertension, Pt. on medications (-) angina+CHF  (-) CAD, (-) Past MI, (-) Cardiac Stents and (-) CABG  Rhythm:Regular Rate:Normal - Systolic murmurs and - Diastolic murmurs    Neuro/Psych neg Seizures negative psych ROS   GI/Hepatic Neg liver ROS, GERD  ,  Endo/Other  diabetes, Oral Hypoglycemic Agents  Renal/GU Renal disease: nephrolithiasis.     Musculoskeletal  (+) Arthritis ,   Abdominal (+) + obese,   Peds  Hematology  (+) Blood dyscrasia, anemia ,   Anesthesia Other Findings Past Medical History: 2003: Adenomatous colon polyp No date: Benign neoplasm of colon No date: Carpal tunnel syndrome No date: Chest pain, unspecified No date: CHF (congestive heart failure) (HCC)     Comment:  diastolic No date: Degeneration of intervertebral disc, site unspecified No date: Diabetes mellitus     Comment:  type II No date: Encounter for long-term (current) use of other medications No date: Fluid overload No date: GERD (gastroesophageal reflux disease) No date: Heart disease, unspecified No date: Hyperlipidemia No date: Hypertension 3/12: Kidney stone     Comment:  hosp/ with stent No date: Obesity, unspecified No date: Rosacea No date: Shortness of breath No date: Unspecified asthma(493.90) No date: Unspecified sleep apnea   Reproductive/Obstetrics                              Anesthesia Physical  Anesthesia Plan  ASA: III  Anesthesia Plan: General   Post-op Pain Management:    Induction: Intravenous  PONV Risk Score and Plan: 2 and Treatment may vary due to age or medical condition, Propofol infusion and TIVA  Airway Management Planned: Natural Airway and Simple Face Mask  Additional Equipment:   Intra-op Plan:   Post-operative Plan: Extubation in OR  Informed Consent: I have reviewed the patients History and Physical, chart, labs and discussed the procedure including the risks, benefits and alternatives for the proposed anesthesia with the patient or authorized representative who has indicated his/her understanding and acceptance.     Dental advisory given  Plan Discussed with: CRNA and Anesthesiologist  Anesthesia Plan Comments:        Anesthesia Quick Evaluation

## 2020-02-10 ENCOUNTER — Telehealth: Payer: Self-pay | Admitting: *Deleted

## 2020-02-10 NOTE — Telephone Encounter (Signed)
Pt calling stating that she had botox in the OR yesterday and she was administered a "pain medication" thru her IV, pt is now asking if that pain medication comes in pill form? Pt states it made her back feel great because she has severe back pain. Please advise

## 2020-02-10 NOTE — Telephone Encounter (Signed)
.  left message to have patient return my call.  

## 2020-02-10 NOTE — Telephone Encounter (Signed)
Outside of the scope of urology.  Please let patient know I will not be prescribing meds for this purpose.  Hollice Espy, MD

## 2020-02-11 NOTE — Telephone Encounter (Signed)
.  left message to have patient return my call.  

## 2020-02-12 NOTE — Telephone Encounter (Signed)
Spoke with patient and advised results   

## 2020-02-12 NOTE — Telephone Encounter (Signed)
Tried calling pt again, mailbox is full could not leave message, I will close out this message until pt calls back.

## 2020-02-17 ENCOUNTER — Telehealth: Payer: Self-pay

## 2020-02-17 ENCOUNTER — Ambulatory Visit: Payer: Medicare Other | Admitting: Physician Assistant

## 2020-02-17 ENCOUNTER — Encounter: Payer: Self-pay | Admitting: Physician Assistant

## 2020-02-17 ENCOUNTER — Other Ambulatory Visit: Payer: Self-pay

## 2020-02-17 VITALS — BP 100/66 | HR 99 | Ht 61.0 in | Wt 205.0 lb

## 2020-02-17 DIAGNOSIS — R300 Dysuria: Secondary | ICD-10-CM | POA: Diagnosis not present

## 2020-02-17 DIAGNOSIS — N3281 Overactive bladder: Secondary | ICD-10-CM | POA: Diagnosis not present

## 2020-02-17 LAB — BLADDER SCAN AMB NON-IMAGING

## 2020-02-17 MED ORDER — CEFUROXIME AXETIL 250 MG PO TABS: 250.0000 mg | ORAL_TABLET | Freq: Two times a day (BID) | ORAL | 0 refills | Status: AC

## 2020-02-17 NOTE — Telephone Encounter (Signed)
Per Thomas Hoff via secure chat: can you please call her and let her know i sent in abx per her ua results to [facility]? cefuroxime 250mg  bid x5 days.  Patient notified as advised.

## 2020-02-17 NOTE — Progress Notes (Signed)
02/17/2020 2:25 PM   Gabriella Howe 13-Dec-1934 [id number]  CC: Chief Complaint  Patient presents with  . Follow-up    HPI: Gabriella Howe is a 85 y.o. female with PMH nephrolithiasis, recurrent UTI versus Proteus urinary colonization, and OAB wet now s/p intravesical Botox injections with Dr. Erlene Quan on 02/09/2020 who presents today for follow-up PVR.  Today she reports having finished her most recent course of antibiotics 4 days ago with resolution of dysuria.  She states that 2 days ago, her dysuria, lower abdominal pressure, and frequency every 10 minutes returned.  She states she was doing well in terms of her urgency and urge incontinence prior to this.  PVR 28 mL.  In-office UA today positive for trace intact blood, 3+ protein, nitrites, and 3+ leukocyte esterase; urine microscopy with 11-30 WBCs/HPF, triple phosphate crystals, and many bacteria.   PMH: Past Medical History:  Diagnosis Date  . Adenomatous colon polyp 2003  . Benign neoplasm of colon   . Carpal tunnel syndrome    much better  . Chest pain, unspecified   . CHF (congestive heart failure) (HCC)    diastolic  . Complication of anesthesia    careful in positioning due to back pain  . Degeneration of intervertebral disc, site unspecified   . Diabetes mellitus    type II  . Encounter for long-term (current) use of other medications   . Fluid overload   . GERD (gastroesophageal reflux disease)   . Heart disease, unspecified   . History of kidney stones    with stent/hospitalized  . Hyperlipidemia   . Hypertension   . Obesity, unspecified   . Rosacea   . Shortness of breath   . Unspecified asthma(493.90)   . Unspecified sleep apnea    no cpap/ sleeps sitting up    Surgical History: Past Surgical History:  Procedure Laterality Date  . ABDOMINAL HYSTERECTOMY     partial, fibroids  . BOTOX INJECTION N/A 02/09/2020   Procedure: BOTOX INJECTION;  Surgeon: Hollice Espy, MD;  Location: [facility];   Service: Urology;  Laterality: N/A;  . CYSTOSCOPY WITH STENT PLACEMENT Left 02/18/2019   Procedure: CYSTOSCOPY WITH STENT PLACEMENT;  Surgeon: Abbie Sons, MD;  Location: [facility];  Service: Urology;  Laterality: Left;  . CYSTOSCOPY/URETEROSCOPY/HOLMIUM LASER/STENT PLACEMENT Left 04/01/2019   Procedure: CYSTOSCOPY/URETEROSCOPY/HOLMIUM LASER/STENT PLACEMENT;  Surgeon: Abbie Sons, MD;  Location: [facility];  Service: Urology;  Laterality: Left;  . ESOPHAGOGASTRODUODENOSCOPY  2000  . TONSILLECTOMY    . UMBILICAL HERNIA REPAIR  01/2004  . URETERAL STENT PLACEMENT  2/13   for L sided stone/ ecoli sepsis    Home Medications:  Allergies as of 02/17/2020      Reactions   Rofecoxib    REACTION: reaction not known   Antihistamines, Chlorpheniramine-type Palpitations   Sulfa Antibiotics Rash   Tetanus-diphtheria Toxoids Td Other (See Comments)   REACTION: sick with fever      Medication List       Accurate as of February 17, 2020  2:25 PM. If you have any questions, ask your nurse or doctor.        Accu-Chek Aviva Plus test strip Generic drug: glucose blood USE TO CHECK BLOOD SUGAR ONCE DAILY AND AS NEEDED (DX. E11.9)   Accu-Chek Softclix Lancets lancets USE AS DIRECTED TO CHECK BLOOD SUGAR ONCE DAILY AND AS NEEDED. (DX. E11.9)   B-12 PO Take 1,000 mcg by mouth daily.   BC FAST PAIN RELIEF PO Take  1 packet by mouth daily as needed (headache).   benazepril 20 MG tablet Commonly known as: LOTENSIN Take 1 tablet (20 mg total) by mouth daily.   CALCIUM-VITAMIN D PO Take 600 mg by mouth daily.   cholecalciferol 25 MCG (1000 UNIT) tablet Commonly known as: VITAMIN D Take 1,000 Units by mouth daily.   FISH OIL PO Take 1,000 mg by mouth daily.   furosemide 40 MG tablet Commonly known as: LASIX Take 1 tablet (40 mg total) by mouth daily.   glipiZIDE 10 MG 24 hr tablet Commonly known as: GLUCOTROL XL Take 1 tablet (10 mg total) by mouth daily with breakfast.     metFORMIN 1000 MG tablet Commonly known as: GLUCOPHAGE Take 1 tablet (1,000 mg total) by mouth 2 (two) times daily with a meal.   multivitamin capsule Take 1 capsule by mouth daily.   naproxen sodium 220 MG tablet Commonly known as: ALEVE Take 220 mg by mouth daily as needed (Pain).   omeprazole 20 MG capsule Commonly known as: PRILOSEC Take 1 capsule (20 mg total) by mouth daily.   simvastatin 40 MG tablet Commonly known as: ZOCOR Take 1 tablet (40 mg total) by mouth at bedtime.   SINUS & ALLERGY 12 HOUR PO Take 1 tablet by mouth daily.   triamcinolone 0.1 % Commonly known as: KENALOG APPLY TO AFFECTED AREA(S)  TOPICALLY TWO TIMES A DAY What changed: See the new instructions.   vitamin C 500 MG tablet Commonly known as: ASCORBIC ACID Take 500 mg by mouth daily.   vitamin E 180 MG (400 UNITS) capsule Take 400 Units by mouth daily.       Allergies:  Allergies  Allergen Reactions  . Rofecoxib     REACTION: reaction not known  . Antihistamines, Chlorpheniramine-Type Palpitations  . Sulfa Antibiotics Rash  . Tetanus-Diphtheria Toxoids Td Other (See Comments)    REACTION: sick with fever    Family History: Family History  Problem Relation Age of Onset  . Early death Father 37       train accident  . Diabetes Mother   . Heart disease Mother   . Diabetes Sister   . Diabetes Brother   . Colon cancer Brother   . Cancer Paternal Grandmother        unknown  . Diabetes Sister     Social History:   reports that she has never smoked. She has never used smokeless tobacco. She reports that she does not drink alcohol and does not use drugs.  Physical Exam: BP 100/66 (BP Location: Left Arm, Patient Position: Sitting, Cuff Size: Large)   Pulse 99   Ht 5\' 1"  (1.549 m)   Wt 205 lb (93 kg)   BMI 38.73 kg/m   Constitutional:  Alert and oriented, no acute distress, nontoxic appearing HEENT: [place], AT Cardiovascular: No clubbing, cyanosis, or edema Respiratory:  Normal respiratory effort, no increased work of breathing Skin: No rashes, bruises or suspicious lesions Neurologic: Grossly intact, no focal deficits, moving all 4 extremities Psychiatric: Normal mood and affect  Laboratory Data: Results for orders placed or performed in visit on 02/17/20  CULTURE, URINE COMPREHENSIVE   Specimen: Urine   UR  Result Value Ref Range   Urine Culture, Comprehensive Final report (A)    Organism ID, Bacteria Proteus mirabilis (A)    ANTIMICROBIAL SUSCEPTIBILITY Comment   Bladder Scan (Post Void Residual) in office  Result Value Ref Range   Scan Result 72mL    Assessment & Plan:  1. Overactive bladder Patient noted symptomatic improvement immediately following intravesical Botox prior to redevelopment of dysuria, lower abdominal pressure, and frequency 2 days ago.  PVR WNL today.  Counseled the patient that intravesical Botox takes 14 days to take full effect and she may continue to see symptomatic improvement over the coming week.  She expressed understanding. - Bladder Scan (Post Void Residual) in office  2. Dysuria Concern for Proteus colonization, however given her sudden onset of dysuria and frequency with grossly positive UA today in the setting of recent urologic procedure, will start empiric cefuroxime and send for culture for further evaluation. - Urinalysis, Complete; Future - CULTURE, URINE COMPREHENSIVE - cefUROXime (CEFTIN) 250 MG tablet; Take 1 tablet (250 mg total) by mouth 2 (two) times daily with a meal for 5 days.  Dispense: 10 tablet; Refill: 0   Return if symptoms worsen or fail to improve.  Debroah Loop, PA-C  [facility] [address], [place], [place] [address] [phone]

## 2020-02-20 LAB — CULTURE, URINE COMPREHENSIVE

## 2020-02-24 LAB — MICROSCOPIC EXAMINATION

## 2020-02-24 LAB — URINALYSIS, COMPLETE
Bilirubin, UA: NEGATIVE
Glucose, UA: NEGATIVE
Ketones, UA: NEGATIVE
Nitrite, UA: POSITIVE — AB
Specific Gravity, UA: 1.015 (ref 1.005–1.030)
Urobilinogen, Ur: 0.2 mg/dL (ref 0.2–1.0)
pH, UA: >9 — ABNORMAL HIGH (ref 5.0–7.5)

## 2020-02-26 ENCOUNTER — Encounter: Payer: Self-pay | Admitting: Physician Assistant

## 2020-02-26 ENCOUNTER — Telehealth: Payer: Self-pay

## 2020-02-26 ENCOUNTER — Other Ambulatory Visit: Payer: Self-pay

## 2020-02-26 ENCOUNTER — Ambulatory Visit: Payer: Medicare Other | Admitting: Physician Assistant

## 2020-02-26 VITALS — BP 187/95 | HR 92

## 2020-02-26 DIAGNOSIS — N390 Urinary tract infection, site not specified: Secondary | ICD-10-CM | POA: Diagnosis not present

## 2020-02-26 DIAGNOSIS — R300 Dysuria: Secondary | ICD-10-CM

## 2020-02-26 LAB — BLADDER SCAN AMB NON-IMAGING: Scan Result: 0

## 2020-02-26 MED ORDER — TRIMETHOPRIM 100 MG PO TABS: 100.0000 mg | ORAL_TABLET | Freq: Every day | ORAL | 2 refills | Status: DC

## 2020-02-26 MED ORDER — CEPHALEXIN 250 MG PO CAPS: 250.0000 mg | ORAL_CAPSULE | Freq: Every day | ORAL | 2 refills | Status: DC

## 2020-02-26 MED ORDER — CIPROFLOXACIN HCL 250 MG PO TABS: 250.0000 mg | ORAL_TABLET | Freq: Two times a day (BID) | ORAL | 0 refills | Status: AC

## 2020-02-26 NOTE — Telephone Encounter (Signed)
Patient called stating that she finished her abx on Sunday and her symptoms have come back today. She would like a refill of her abx. It was explained that she will need to come in for a visit for UA and evaluation. She was added on to this afternoon per her request. Symptoms are dysuria, pressure, frequency, and bladder pain.

## 2020-02-26 NOTE — Patient Instructions (Addendum)
I'm starting you on Cipro to treat your current UTI. Please contact Dr. Glori Bickers to ask if you should reduce your dose of Glipizide while on this medication.  Once you complete Cipro, I want you to start Keflex (cephalexin) every day for three months to prevent UTIs.

## 2020-02-26 NOTE — Progress Notes (Signed)
02/26/2020 1:42 PM   Gabriella Howe [birth year] [id number]  CC: Chief Complaint  Patient presents with  . Urinary Tract Infection    HPI: Gabriella Howe is a 85 y.o. female with PMH nephrolithiasis, recurrent UTI versus Proteus urinary colonization, and OAB wet now s/p intravesical Botox injections with Dr. Erlene Quan on 02/09/2020 who presents today for evaluation of possible UTI.  I saw her in clinic most recently on 02/17/2020 for follow-up PVR following intravesical Botox.  At that time, she reported a 2-day history of dysuria, lower abdominal pressure, and frequency consistent with past UTIs.  Urine culture ultimately grew nitrofurantoin and tetracycline resistant Proteus mirabilis and I treated her with cefuroxime 250 mg twice daily x5 days.  Today she reports having completed cefuroxime 4 days ago.  She states her dysuria, pressure, and frequency had resolved at that time.  However, she reports return of the symptoms over the past 24 to 48 hours.  She is now voiding every 20 minutes.  Patient reports she has the same symptoms of dysuria, frequency, and lower abdominal pressure with UTI.  The symptoms resolve on antibiotics and return within 3 to 4 days after completion of antibiotics.  In-office UA today positive for 2+ blood, 3+ protein, nitrites, and 1+ leukocyte esterase; urine microscopy with >30 WBCs/HPF, 3-10 RBCs/HPF, and many bacteria. PVR 56mL.  PMH: Past Medical History:  Diagnosis Date  . Adenomatous colon polyp 2003  . Benign neoplasm of colon   . Carpal tunnel syndrome    much better  . Chest pain, unspecified   . CHF (congestive heart failure) (HCC)    diastolic  . Complication of anesthesia    careful in positioning due to back pain  . Degeneration of intervertebral disc, site unspecified   . Diabetes mellitus    type II  . Encounter for long-term (current) use of other medications   . Fluid overload   . GERD (gastroesophageal reflux disease)   . Heart  disease, unspecified   . History of kidney stones    with stent/hospitalized  . Hyperlipidemia   . Hypertension   . Obesity, unspecified   . Rosacea   . Shortness of breath   . Unspecified asthma(493.90)   . Unspecified sleep apnea    no cpap/ sleeps sitting up    Surgical History: Past Surgical History:  Procedure Laterality Date  . ABDOMINAL HYSTERECTOMY     partial, fibroids  . BOTOX INJECTION N/A 02/09/2020   Procedure: BOTOX INJECTION;  Surgeon: Hollice Espy, MD;  Location: [facility];  Service: Urology;  Laterality: N/A;  . CYSTOSCOPY WITH STENT PLACEMENT Left 02/18/2019   Procedure: CYSTOSCOPY WITH STENT PLACEMENT;  Surgeon: Abbie Sons, MD;  Location: [facility];  Service: Urology;  Laterality: Left;  . CYSTOSCOPY/URETEROSCOPY/HOLMIUM LASER/STENT PLACEMENT Left 04/01/2019   Procedure: CYSTOSCOPY/URETEROSCOPY/HOLMIUM LASER/STENT PLACEMENT;  Surgeon: Abbie Sons, MD;  Location: [facility];  Service: Urology;  Laterality: Left;  . ESOPHAGOGASTRODUODENOSCOPY  2000  . TONSILLECTOMY    . UMBILICAL HERNIA REPAIR  01/2004  . URETERAL STENT PLACEMENT  2/13   for L sided stone/ ecoli sepsis    Home Medications:  Allergies as of 02/26/2020      Reactions   Rofecoxib    REACTION: reaction not known   Antihistamines, Chlorpheniramine-type Palpitations   Sulfa Antibiotics Rash   Tetanus-diphtheria Toxoids Td Other (See Comments)   REACTION: sick with fever      Medication List       Accurate as  of February 26, 2020  1:42 PM. If you have any questions, ask your nurse or doctor.        Accu-Chek Aviva Plus test strip Generic drug: glucose blood USE TO CHECK BLOOD SUGAR ONCE DAILY AND AS NEEDED (DX. E11.9)   Accu-Chek Softclix Lancets lancets USE AS DIRECTED TO CHECK BLOOD SUGAR ONCE DAILY AND AS NEEDED. (DX. E11.9)   B-12 PO Take 1,000 mcg by mouth daily.   BC FAST PAIN RELIEF PO Take 1 packet by mouth daily as needed (headache).   benazepril 20 MG  tablet Commonly known as: LOTENSIN Take 1 tablet (20 mg total) by mouth daily.   CALCIUM-VITAMIN D PO Take 600 mg by mouth daily.   cholecalciferol 25 MCG (1000 UNIT) tablet Commonly known as: VITAMIN D Take 1,000 Units by mouth daily.   ciprofloxacin 250 MG tablet Commonly known as: CIPRO Take 1 tablet (250 mg total) by mouth 2 (two) times daily for 5 days. Started by: Debroah Loop, PA-C   FISH OIL PO Take 1,000 mg by mouth daily.   furosemide 40 MG tablet Commonly known as: LASIX Take 1 tablet (40 mg total) by mouth daily.   glipiZIDE 10 MG 24 hr tablet Commonly known as: GLUCOTROL XL Take 1 tablet (10 mg total) by mouth daily with breakfast.   metFORMIN 1000 MG tablet Commonly known as: GLUCOPHAGE Take 1 tablet (1,000 mg total) by mouth 2 (two) times daily with a meal.   multivitamin capsule Take 1 capsule by mouth daily.   naproxen sodium 220 MG tablet Commonly known as: ALEVE Take 220 mg by mouth daily as needed (Pain).   omeprazole 20 MG capsule Commonly known as: PRILOSEC Take 1 capsule (20 mg total) by mouth daily.   simvastatin 40 MG tablet Commonly known as: ZOCOR Take 1 tablet (40 mg total) by mouth at bedtime.   SINUS & ALLERGY 12 HOUR PO Take 1 tablet by mouth daily.   triamcinolone 0.1 % Commonly known as: KENALOG APPLY TO AFFECTED AREA(S)  TOPICALLY TWO TIMES A DAY What changed: See the new instructions.   trimethoprim 100 MG tablet Commonly known as: TRIMPEX Take 1 tablet (100 mg total) by mouth daily. Started by: Debroah Loop, PA-C   vitamin C 500 MG tablet Commonly known as: ASCORBIC ACID Take 500 mg by mouth daily.   vitamin E 180 MG (400 UNITS) capsule Take 400 Units by mouth daily.       Allergies:  Allergies  Allergen Reactions  . Rofecoxib     REACTION: reaction not known  . Antihistamines, Chlorpheniramine-Type Palpitations  . Sulfa Antibiotics Rash  . Tetanus-Diphtheria Toxoids Td Other (See  Comments)    REACTION: sick with fever    Family History: Family History  Problem Relation Age of Onset  . Early death Father 11       train accident  . Diabetes Mother   . Heart disease Mother   . Diabetes Sister   . Diabetes Brother   . Colon cancer Brother   . Cancer Paternal Grandmother        unknown  . Diabetes Sister     Social History:   reports that she has never smoked. She has never used smokeless tobacco. She reports that she does not drink alcohol and does not use drugs.  Physical Exam: BP (!) 187/95   Pulse 92   Constitutional:  Alert and oriented, no acute distress, nontoxic appearing HEENT: , AT Cardiovascular: No clubbing, cyanosis, or edema Respiratory:  Normal respiratory effort, no increased work of breathing Skin: No rashes, bruises or suspicious lesions Neurologic: Grossly intact, no focal deficits, moving all 4 extremities Psychiatric: Normal mood and affect  Laboratory Data: Results for orders placed or performed in visit on 02/26/20  CULTURE, URINE COMPREHENSIVE   Specimen: Urine   UR  Result Value Ref Range   Urine Culture, Comprehensive Preliminary report (A)    Organism ID, Bacteria Gram negative rods (A)   Microscopic Examination   Urine  Result Value Ref Range   WBC, UA >30 (A) 0 - 5 /hpf   RBC 3-10 (A) 0 - 2 /hpf   Epithelial Cells (non renal) 0-10 0 - 10 /hpf   Renal Epithel, UA 0-10 (A) None seen /hpf   Bacteria, UA Many (A) None seen/Few  Urinalysis, Complete  Result Value Ref Range   Specific Gravity, UA 1.025 1.005 - 1.030   pH, UA 7.0 5.0 - 7.5   Color, UA Yellow Yellow   Appearance Ur Cloudy (A) Clear   Leukocytes,UA 1+ (A) Negative   Protein,UA 3+ (A) Negative/Trace   Glucose, UA Negative Negative   Ketones, UA Negative Negative   RBC, UA 2+ (A) Negative   Bilirubin, UA Negative Negative   Urobilinogen, Ur 0.2 0.2 - 1.0 mg/dL   Nitrite, UA Positive (A) Negative   Microscopic Examination See below:   Bladder Scan  (Post Void Residual) in office  Result Value Ref Range   Scan Result 0    Assessment & Plan:   1. Dysuria Return of irritative voiding symptoms and grossly positive UA 48 hours after completion of culture appropriate cefuroxime for Proteus recurrent UTI versus urinary colonization.  Will treat again with empiric Cipro and send for culture for further evaluation. - Urinalysis, Complete - Bladder Scan (Post Void Residual) in office - CULTURE, URINE COMPREHENSIVE - ciprofloxacin (CIPRO) 250 MG tablet; Take 1 tablet (250 mg total) by mouth 2 (two) times daily for 5 days.  Dispense: 10 tablet; Refill: 0  2. Recurrent UTI We will start suppressive trimethoprim x3 months after completion of Cipro with plans for symptom recheck with Dr. Erlene Quan upon completion. - trimethoprim (TRIMPEX) 100 MG tablet; Take 1 tablet (100 mg total) by mouth daily.  Dispense: 30 tablet; Refill: 2  Return in about 3 months (around 05/26/2020) for rUTI follow-up with Dr. Erlene Quan.  Debroah Loop, PA-C  [facility] [address] [place], [place] [address] [phone]

## 2020-02-27 LAB — URINALYSIS, COMPLETE
Bilirubin, UA: NEGATIVE
Glucose, UA: NEGATIVE
Ketones, UA: NEGATIVE
Nitrite, UA: POSITIVE — AB
Specific Gravity, UA: 1.025 (ref 1.005–1.030)
Urobilinogen, Ur: 0.2 mg/dL (ref 0.2–1.0)
pH, UA: 7 (ref 5.0–7.5)

## 2020-02-27 LAB — MICROSCOPIC EXAMINATION: WBC, UA: >30 /hpf — AB (ref 0–5)

## 2020-03-03 LAB — CULTURE, URINE COMPREHENSIVE

## 2020-03-30 ENCOUNTER — Other Ambulatory Visit: Payer: Self-pay

## 2020-03-30 ENCOUNTER — Ambulatory Visit: Payer: Medicare Other | Admitting: Urology

## 2020-03-30 VITALS — BP 130/87 | HR 96

## 2020-03-30 DIAGNOSIS — N200 Calculus of kidney: Secondary | ICD-10-CM | POA: Diagnosis not present

## 2020-03-30 DIAGNOSIS — N3941 Urge incontinence: Secondary | ICD-10-CM

## 2020-03-30 DIAGNOSIS — N390 Urinary tract infection, site not specified: Secondary | ICD-10-CM | POA: Diagnosis not present

## 2020-03-30 LAB — BLADDER SCAN AMB NON-IMAGING: Scan Result: 0

## 2020-03-30 NOTE — Progress Notes (Unsigned)
03/30/2020 8:12 AM   Gabriella Howe 02/18/1935 [id number]  Referring provider: Abner Greenspan, MD [address] [place],  [place] [address]  Chief Complaint  Patient presents with  . Recurrent UTI    HPI: 86 year old female with personal history of recurrent UTI, OAB/urge incontinence and kidney stones returns today for follow-up.  More recently, she underwent Botox in November 2021 for treatment of her refractory OAB symptoms.  She is been doing extremely well with this medication.  She reports that she has significantly fewer accidents has been able to cut back pads.  It is improved the quality of her life.  She also has less frequency and urgency.  Adequate bladder emptying today, PVR 0.  Personal history of kidney stones, small nonobstructing left-sided stone burden on most recent scan 11/2019  Recurrent versus chronic colonization with Proteus.  Multiple UTIs in recent past, no on a short-term 3 months of suppression regimen.  She not had any breakthrough infections on this.  She has been a month and a half left.   PMH: Past Medical History:  Diagnosis Date  . Adenomatous colon polyp 2003  . Benign neoplasm of colon   . Carpal tunnel syndrome    much better  . Chest pain, unspecified   . CHF (congestive heart failure) (HCC)    diastolic  . Complication of anesthesia    careful in positioning due to back pain  . Degeneration of intervertebral disc, site unspecified   . Diabetes mellitus    type II  . Encounter for long-term (current) use of other medications   . Fluid overload   . GERD (gastroesophageal reflux disease)   . Heart disease, unspecified   . History of kidney stones    with stent/hospitalized  . Hyperlipidemia   . Hypertension   . Obesity, unspecified   . Rosacea   . Shortness of breath   . Unspecified asthma(493.90)   . Unspecified sleep apnea    no cpap/ sleeps sitting up    Surgical History: Past Surgical History:  Procedure  Laterality Date  . ABDOMINAL HYSTERECTOMY     partial, fibroids  . BOTOX INJECTION N/A 02/09/2020   Procedure: BOTOX INJECTION;  Surgeon: Hollice Espy, MD;  Location: [facility] ORS;  Service: Urology;  Laterality: N/A;  . CYSTOSCOPY WITH STENT PLACEMENT Left 02/18/2019   Procedure: CYSTOSCOPY WITH STENT PLACEMENT;  Surgeon: Abbie Sons, MD;  Location: [facility];  Service: Urology;  Laterality: Left;  . CYSTOSCOPY/URETEROSCOPY/HOLMIUM LASER/STENT PLACEMENT Left 04/01/2019   Procedure: CYSTOSCOPY/URETEROSCOPY/HOLMIUM LASER/STENT PLACEMENT;  Surgeon: Abbie Sons, MD;  Location: [facility] OR[facility];  Service: Urology;  Laterality: Left;  . ESOPHAGOGASTRODUODENOSCOPY  2000  . TONSILLECTOMY    . UMBILICAL HERNIA REPAIR  01/2004  . URETERAL STENT PLACEMENT  2/13   for L sided stone/ ecoli sepsis    Home Medications:  Allergies as of 03/30/2020      Reactions   Rofecoxib    REACTION: reaction not known   Antihistamines, Chlorpheniramine-type Palpitations   Sulfa Antibiotics Rash   Tetanus-diphtheria Toxoids Td Other (See Comments)   REACTION: sick with fever      Medication List       Accurate as of March 30, 2020 11:59 PM. If you have any questions, ask your nurse or doctor.        Accu-Chek Aviva Plus test strip Generic drug: glucose blood USE TO CHECK BLOOD SUGAR ONCE DAILY AND AS NEEDED (DX. E11.9)   Accu-Chek Softclix Lancets  lancets USE AS DIRECTED TO CHECK BLOOD SUGAR ONCE DAILY AND AS NEEDED. (DX. E11.9)   B-12 PO Take 1,000 mcg by mouth daily.   BC FAST PAIN RELIEF PO Take 1 packet by mouth daily as needed (headache).   benazepril 20 MG tablet Commonly known as: LOTENSIN Take 1 tablet (20 mg total) by mouth daily.   CALCIUM-VITAMIN D PO Take 600 mg by mouth daily.   cephALEXin 250 MG capsule Commonly known as: KEFLEX Take by mouth daily. What changed: Another medication with the same name was removed. Continue taking this medication, and follow the directions you  see here. Changed by: Vanna Scotland, MD   cholecalciferol 25 MCG (1000 UNIT) tablet Commonly known as: VITAMIN D Take 1,000 Units by mouth daily.   FISH OIL PO Take 1,000 mg by mouth daily.   furosemide 40 MG tablet Commonly known as: LASIX Take 1 tablet (40 mg total) by mouth daily.   glipiZIDE 10 MG 24 hr tablet Commonly known as: GLUCOTROL XL Take 1 tablet (10 mg total) by mouth daily with breakfast.   metFORMIN 1000 MG tablet Commonly known as: GLUCOPHAGE Take 1 tablet (1,000 mg total) by mouth 2 (two) times daily with a meal.   multivitamin capsule Take 1 capsule by mouth daily.   naproxen sodium 220 MG tablet Commonly known as: ALEVE Take 220 mg by mouth daily as needed (Pain).   omeprazole 20 MG capsule Commonly known as: PRILOSEC Take 1 capsule (20 mg total) by mouth daily.   simvastatin 40 MG tablet Commonly known as: ZOCOR Take 1 tablet (40 mg total) by mouth at bedtime.   SINUS & ALLERGY 12 HOUR PO Take 1 tablet by mouth daily.   triamcinolone 0.1 % Commonly known as: KENALOG APPLY TO AFFECTED AREA(S)  TOPICALLY TWO TIMES A DAY What changed: See the new instructions.   vitamin C 500 MG tablet Commonly known as: ASCORBIC ACID Take 500 mg by mouth daily.   vitamin E 180 MG (400 UNITS) capsule Take 400 Units by mouth daily.       Allergies:  Allergies  Allergen Reactions  . Rofecoxib     REACTION: reaction not known  . Antihistamines, Chlorpheniramine-Type Palpitations  . Sulfa Antibiotics Rash  . Tetanus-Diphtheria Toxoids Td Other (See Comments)    REACTION: sick with fever    Family History: Family History  Problem Relation Age of Onset  . Early death Father 24       train accident  . Diabetes Mother   . Heart disease Mother   . Diabetes Sister   . Diabetes Brother   . Colon cancer Brother   . Cancer Paternal Grandmother        unknown  . Diabetes Sister     Social History:  reports that she has never smoked. She has never  used smokeless tobacco. She reports that she does not drink alcohol and does not use drugs.   Physical Exam: BP 130/87   Pulse 96   Constitutional:  Alert and oriented, No acute distress.  In wheelchair. HEENT: [place] AT, moist mucus membranes.  Trachea midline, no masses. Cardiovascular: No clubbing, cyanosis, or edema. Respiratory: Normal respiratory effort, no increased work of breathing. Skin: No rashes, bruises or suspicious lesions. Neurologic: Grossly intact, no focal deficits, moving all 4 extremities. Psychiatric: Normal mood and affect.   Pertinent Imaging: Results for orders placed or performed in visit on 03/30/20  BLADDER SCAN AMB NON-IMAGING  Result Value Ref Range  Scan Result 0 ml     Assessment & Plan:    1. Urge incontinence Severe refractory symptoms now doing extremely well on Botox  She would like to continue this medication  We discussed our experience with Botox in the office.  She would like to transition to this.  She will be due for it sometime in May.  We discussed preoperative UA/urine culture and treatment as needed prior to Botox injection.  We discussed local anesthetic in the office followed up prior to the procedure.  She would like to try this.  We will arrange next injection. - BLADDER SCAN AMB NON-IMAGING  2. Recurrent UTI Currently on suppressive antibiotics, complete this course  3. Nephrolithiasis Relatively small/punctate left-sided stones, but not likely contributing factor to her recurrent infections    Hollice Espy, MD  [place] [address] [place], [place] [address] [phone]

## 2020-05-06 ENCOUNTER — Telehealth: Payer: Self-pay

## 2020-05-06 NOTE — Chronic Care Management (AMB) (Addendum)
Chronic Care Management Pharmacy Assistant   Name: Gabriella Howe  MRN: [id number] DOB: [birth year]  Reason for Encounter: Disease State- Diabetes  Patient Question:  1.  Have you seen any other providers or had any medication changes since your last visit with Debbora Dus, Pharm. D? Yes  03/30/20- Hollice Espy, MD- Urology 02/26/20- Hollice Espy, MD- Urology- Restarted Cephalexin 500 mg 02/17/20- Cathe Mons, PA-C- Urology- Started Ceftin 250 mg 02/09/20- Dr. Hollice Espy- procedure Urology 01/20/20- Dr. Glori Bickers- PCP  01/15/20-Ashley Erlene Quan, MD- Urology 12/17/19-Ashley Erlene Quan, MD- Urology 11/04/19- Cathe Mons, PA-C- Urology- Started Augmentin [phone]   PCP : Abner Greenspan, MD  Allergies:   Allergies  Allergen Reactions   Rofecoxib     REACTION: reaction not known   Antihistamines, Chlorpheniramine-Type Palpitations   Sulfa Antibiotics Rash   Tetanus-Diphtheria Toxoids Td Other (See Comments)    REACTION: sick with fever    Medications: Outpatient Encounter Medications as of 05/06/2020  Medication Sig   Accu-Chek Softclix Lancets lancets USE AS DIRECTED TO CHECK BLOOD SUGAR ONCE DAILY AND AS NEEDED. (DX. E11.9)   Ascorbic Acid (VITAMIN C) 500 MG tablet Take 500 mg by mouth daily.   Aspirin-Caffeine (BC FAST PAIN RELIEF PO) Take 1 packet by mouth daily as needed (headache).   benazepril (LOTENSIN) 20 MG tablet Take 1 tablet (20 mg total) by mouth daily.   CALCIUM-VITAMIN D PO Take 600 mg by mouth daily.   cephALEXin (KEFLEX) 250 MG capsule Take by mouth daily.   cholecalciferol (VITAMIN D) 25 MCG (1000 UNIT) tablet Take 1,000 Units by mouth daily.   Cyanocobalamin (B-12 PO) Take 1,000 mcg by mouth daily.    furosemide (LASIX) 40 MG tablet Take 1 tablet (40 mg total) by mouth daily.   glipiZIDE (GLUCOTROL XL) 10 MG 24 hr tablet Take 1 tablet (10 mg total) by mouth daily with breakfast.   glucose blood (ACCU-CHEK AVIVA PLUS) test strip USE TO CHECK  BLOOD SUGAR ONCE DAILY AND AS NEEDED (DX. E11.9)   metFORMIN (GLUCOPHAGE) 1000 MG tablet Take 1 tablet (1,000 mg total) by mouth 2 (two) times daily with a meal.   Multiple Vitamin (MULTIVITAMIN) capsule Take 1 capsule by mouth daily.   naproxen sodium (ALEVE) 220 MG tablet Take 220 mg by mouth daily as needed (Pain).    Omega-3 Fatty Acids (FISH OIL PO) Take 1,000 mg by mouth daily.   omeprazole (PRILOSEC) 20 MG capsule Take 1 capsule (20 mg total) by mouth daily.   Pseudoephedrine HCl (SINUS & ALLERGY 12 HOUR PO) Take 1 tablet by mouth daily.    simvastatin (ZOCOR) 40 MG tablet Take 1 tablet (40 mg total) by mouth at bedtime.   triamcinolone cream (KENALOG) 0.1 % APPLY TO AFFECTED AREA(S)  TOPICALLY TWO TIMES A DAY (Patient taking differently: Apply 1 application topically daily as needed (Cut/infection).)   vitamin E 400 UNIT capsule Take 400 Units by mouth daily.   No facility-administered encounter medications on file as of 05/06/2020.    Current Diagnosis: Patient Active Problem List   Diagnosis Date Noted   Frequent UTI 09/03/2019   Acute cystitis 08/19/2019   Pedal edema 08/05/2019   DJD (degenerative joint disease) 02/26/2019   Iron deficiency anemia 04/30/2017   History of shingles 10/03/2016   Routine general medical examination at a health care facility 04/14/2015   Back pain 07/28/2014   Knee pain 07/28/2014   Incomplete emptying of bladder 11/28/2012   Frequency of urination 11/28/2012   Encounter for Medicare  annual wellness exam 11/19/2012   History of kidney stones 67/59/1638   DIASTOLIC HEART FAILURE, CHRONIC 07/29/2009   Hyperlipidemia associated with type 2 diabetes mellitus ([place]) 09/13/2006   Morbid obesity ([place]) 09/10/2006   CARPAL TUNNEL SYNDROME 09/10/2006   Essential hypertension 46/65/9935   DIASTOLIC DYSFUNCTION 70/17/7939   REACTIVE AIRWAY DISEASE 09/10/2006   GERD 09/10/2006   ROSACEA 09/10/2006   Degeneration of lumbar or lumbosacral intervertebral  disc 09/10/2006   OSA (obstructive sleep apnea) 09/10/2006   COLONIC POLYPS, HX OF 09/10/2006   Diabetes type 2, controlled ([place]) 09/06/2006    Recent Relevant Labs: Lab Results  Component Value Date/Time   HGBA1C 7.1 (A) 01/20/2020 10:40 AM   HGBA1C 7.4 (H) 10/02/2019 08:08 AM   HGBA1C 7.6 (H) 05/08/2019 09:26 AM   HGBA1C 7.4 (H) 11/08/2012 04:06 PM   HGBA1C 7.2 (H) 05/23/2011 06:28 AM   MICROALBUR 0.2 01/14/2008 10:08 AM   MICROALBUR 4.8 (H) 10/07/2007 09:01 AM    Kidney Function Lab Results  Component Value Date/Time   CREATININE 0.77 02/02/2020 09:52 AM   CREATININE 0.92 10/02/2019 08:08 AM   CREATININE 0.92 11/15/2012 08:53 AM   CREATININE 0.79 11/12/2012 05:49 AM   GFR 58.31 (L) 10/02/2019 08:08 AM   GFRNONAA >60 02/02/2020 09:52 AM   GFRNONAA >60 11/15/2012 08:53 AM   GFRAA >60 02/20/2019 05:34 AM   GFRAA >60 11/15/2012 08:53 AM    Current antihyperglycemic regimen:  Glipizide 10 mg - 1 tablet daily with breakfast Metformin 1000 mg - 1 tablet BID with meal  What recent interventions/DTPs have been made to improve glycemic control:  Dr. Glori Bickers increased Glipizide from 5 mg to 10 mg 06/30/19  Have there been any recent hospitalizations or ED visits since last visit with CPP? Yes- Urology procedure 02/09/20  Patient denies hypoglycemic symptoms, including Pale, Sweaty, Shaky, Hungry, Nervous/irritable and Vision changes- states as long as she eats something every 3-4 hours there are no symptoms of hypoglycemia.  Patient denies hyperglycemic symptoms, including blurry vision, excessive thirst, fatigue, polyuria and weakness  How often are you checking your blood sugar? Not checking blood sugar   What are your blood sugars ranging?  Fasting: N/A Before meals: N/A After meals: N/A Bedtime: N/A  On insulin? No  During the week, how often does your blood glucose drop below 70? Unknown- patient does not check  Are you checking your feet daily/regularly? Yes- denies  and wounds, sores or blisters.   Adherence Review: Is the patient currently on a STATIN medication? Yes Is the patient currently on ACE/ARB medication? Yes Does the patient have >5 day gap between last estimated fill dates? No  Patient states that she does not check her blood sugars. No barrier, she just does not check. Explained to patient that it was important to monitor her blood sugars asked if she would be willing to monitor her BGL the next few weeks. Patient agreed. Set up follow up call in 2 weeks.   Follow-Up:  Pharmacist Review  Debbora Dus, CPP notified  Margaretmary Dys, [place] Assistant [phone]   I have reviewed the care management and care coordination activities outlined in this encounter and I am certifying that I agree with the content of this note. No further action required. Reviewed refill history. No adherence concerns.  Debbora Dus, PharmD Clinical Pharmacist [facility] [phone]

## 2020-06-02 ENCOUNTER — Ambulatory Visit: Payer: Self-pay | Admitting: Urology

## 2020-06-21 ENCOUNTER — Other Ambulatory Visit: Payer: Self-pay | Admitting: Family Medicine

## 2020-06-29 ENCOUNTER — Ambulatory Visit: Payer: Medicare Other

## 2020-06-30 ENCOUNTER — Telehealth: Payer: Self-pay | Admitting: Family Medicine

## 2020-06-30 DIAGNOSIS — D509 Iron deficiency anemia, unspecified: Secondary | ICD-10-CM

## 2020-06-30 DIAGNOSIS — E119 Type 2 diabetes mellitus without complications: Secondary | ICD-10-CM

## 2020-06-30 DIAGNOSIS — E1169 Type 2 diabetes mellitus with other specified complication: Secondary | ICD-10-CM

## 2020-06-30 DIAGNOSIS — E785 Hyperlipidemia, unspecified: Secondary | ICD-10-CM

## 2020-06-30 DIAGNOSIS — I10 Essential (primary) hypertension: Secondary | ICD-10-CM

## 2020-06-30 NOTE — Telephone Encounter (Signed)
-----   Message from Ellamae Sia sent at 06/14/2020 11:40 AM EDT ----- Regarding: Lab orders for Thursday, 4.7.22 Patient is scheduled for CPX labs, please order future labs, Thanks , Karna Christmas

## 2020-07-01 ENCOUNTER — Other Ambulatory Visit: Payer: Self-pay

## 2020-07-01 ENCOUNTER — Other Ambulatory Visit (INDEPENDENT_AMBULATORY_CARE_PROVIDER_SITE_OTHER): Payer: Medicare Other

## 2020-07-01 DIAGNOSIS — E1169 Type 2 diabetes mellitus with other specified complication: Secondary | ICD-10-CM | POA: Diagnosis not present

## 2020-07-01 DIAGNOSIS — I10 Essential (primary) hypertension: Secondary | ICD-10-CM | POA: Diagnosis not present

## 2020-07-01 DIAGNOSIS — E785 Hyperlipidemia, unspecified: Secondary | ICD-10-CM

## 2020-07-01 DIAGNOSIS — E119 Type 2 diabetes mellitus without complications: Secondary | ICD-10-CM

## 2020-07-01 LAB — CBC WITH DIFFERENTIAL/PLATELET
Basophils Absolute: 0 10*3/uL (ref 0.0–0.1)
Basophils Relative: 0.7 % (ref 0.0–3.0)
Eosinophils Absolute: 0.2 10*3/uL (ref 0.0–0.7)
Eosinophils Relative: 2.9 % (ref 0.0–5.0)
HCT: 40.9 % (ref 36.0–46.0)
Hemoglobin: 14 g/dL (ref 12.0–15.0)
Lymphocytes Relative: 25.5 % (ref 12.0–46.0)
Lymphs Abs: 1.6 10*3/uL (ref 0.7–4.0)
MCHC: 34.3 g/dL (ref 30.0–36.0)
MCV: 91.4 fl (ref 78.0–100.0)
Monocytes Absolute: 0.4 10*3/uL (ref 0.1–1.0)
Monocytes Relative: 5.7 % (ref 3.0–12.0)
Neutro Abs: 4.1 10*3/uL (ref 1.4–7.7)
Neutrophils Relative %: 65.2 % (ref 43.0–77.0)
Platelets: 189 10*3/uL (ref 150.0–400.0)
RBC: 4.48 Mil/uL (ref 3.87–5.11)
RDW: 14.2 % (ref 11.5–15.5)
WBC: 6.3 10*3/uL (ref 4.0–10.5)

## 2020-07-01 LAB — COMPREHENSIVE METABOLIC PANEL
ALT: 15 U/L (ref 0–35)
AST: 17 U/L (ref 0–37)
Albumin: 4.4 g/dL (ref 3.5–5.2)
Alkaline Phosphatase: 62 U/L (ref 39–117)
BUN: 21 mg/dL (ref 6–23)
CO2: 26 mEq/L (ref 19–32)
Calcium: 9.9 mg/dL (ref 8.4–10.5)
Chloride: 106 mEq/L (ref 96–112)
Creatinine, Ser: 0.92 mg/dL (ref 0.40–1.20)
GFR: 57.53 mL/min — ABNORMAL LOW (ref >60.00)
Glucose, Bld: 163 mg/dL — ABNORMAL HIGH (ref 70–99)
Potassium: 3.8 mEq/L (ref 3.5–5.1)
Sodium: 142 mEq/L (ref 135–145)
Total Bilirubin: 1.3 mg/dL — ABNORMAL HIGH (ref 0.2–1.2)
Total Protein: 6.6 g/dL (ref 6.0–8.3)

## 2020-07-01 LAB — TSH: TSH: 1.13 u[IU]/mL (ref 0.35–4.50)

## 2020-07-01 LAB — LIPID PANEL
Cholesterol: 121 mg/dL (ref 0–200)
HDL: 53 mg/dL (ref >39.00)
LDL Cholesterol: 42 mg/dL (ref 0–99)
NonHDL: 68.07
Total CHOL/HDL Ratio: 2
Triglycerides: 128 mg/dL (ref 0.0–149.0)
VLDL: 25.6 mg/dL (ref 0.0–40.0)

## 2020-07-01 LAB — HEMOGLOBIN A1C: Hgb A1c MFr Bld: 7.6 % — ABNORMAL HIGH (ref 4.6–6.5)

## 2020-07-08 ENCOUNTER — Encounter: Payer: Self-pay | Admitting: Family Medicine

## 2020-07-08 ENCOUNTER — Ambulatory Visit (INDEPENDENT_AMBULATORY_CARE_PROVIDER_SITE_OTHER): Payer: Medicare Other | Admitting: Family Medicine

## 2020-07-08 ENCOUNTER — Other Ambulatory Visit: Payer: Self-pay

## 2020-07-08 VITALS — BP 128/76 | HR 79 | Temp 96.9°F | Ht 60.0 in | Wt 206.2 lb

## 2020-07-08 DIAGNOSIS — Z0000 Encounter for general adult medical examination without abnormal findings: Secondary | ICD-10-CM

## 2020-07-08 DIAGNOSIS — I5032 Chronic diastolic (congestive) heart failure: Secondary | ICD-10-CM | POA: Diagnosis not present

## 2020-07-08 DIAGNOSIS — I10 Essential (primary) hypertension: Secondary | ICD-10-CM

## 2020-07-08 DIAGNOSIS — E785 Hyperlipidemia, unspecified: Secondary | ICD-10-CM

## 2020-07-08 DIAGNOSIS — E1169 Type 2 diabetes mellitus with other specified complication: Secondary | ICD-10-CM | POA: Diagnosis not present

## 2020-07-08 DIAGNOSIS — E119 Type 2 diabetes mellitus without complications: Secondary | ICD-10-CM | POA: Diagnosis not present

## 2020-07-08 MED ORDER — OMEPRAZOLE 20 MG PO CPDR
1.0000 | DELAYED_RELEASE_CAPSULE | Freq: Every day | ORAL | 3 refills | Status: DC
Start: 2020-07-08 — End: 2021-07-20

## 2020-07-08 MED ORDER — FUROSEMIDE 40 MG PO TABS
40.0000 mg | ORAL_TABLET | Freq: Every day | ORAL | 3 refills | Status: DC
Start: 2020-07-08 — End: 2021-07-20

## 2020-07-08 MED ORDER — SIMVASTATIN 40 MG PO TABS
40.0000 mg | ORAL_TABLET | Freq: Every day | ORAL | 3 refills | Status: DC
Start: 2020-07-08 — End: 2021-07-20

## 2020-07-08 MED ORDER — METFORMIN HCL 1000 MG PO TABS
1.0000 | ORAL_TABLET | Freq: Two times a day (BID) | ORAL | 3 refills | Status: DC
Start: 2020-07-08 — End: 2021-08-25

## 2020-07-08 MED ORDER — BENAZEPRIL HCL 20 MG PO TABS
20.0000 mg | ORAL_TABLET | Freq: Every day | ORAL | 3 refills | Status: DC
Start: 2020-07-08 — End: 2020-11-02

## 2020-07-08 MED ORDER — GLIPIZIDE ER 10 MG PO TB24
10.0000 mg | ORAL_TABLET | Freq: Every day | ORAL | 3 refills | Status: DC
Start: 2020-07-08 — End: 2020-11-10

## 2020-07-08 NOTE — Assessment & Plan Note (Signed)
Discussed how this problem influences overall health and the risks it imposes  Reviewed plan for weight loss with lower calorie diet (via better food choices and also portion control or program like weight watchers) and exercise building up to or more than 30 minutes 5 days per week including some aerobic activity    

## 2020-07-08 NOTE — Assessment & Plan Note (Signed)
Lab Results  Component Value Date   HGBA1C 7.6 (H) 07/01/2020   This is up from 7.1 Pt is apprehensive to add medication  Thinks she can do better with lifestyle change  Taking statin and ace  Disc low glycemic diet  F/u 3 mo

## 2020-07-08 NOTE — Assessment & Plan Note (Signed)
bp in fair control at this time  BP Readings from Last 1 Encounters:  07/08/20 128/76   No changes needed Most recent labs reviewed  Disc lifstyle change with low sodium diet and exercise  Plan to continue benazepril 20 mg daily and lasix 40 mg daily

## 2020-07-08 NOTE — Progress Notes (Signed)
Subjective:    Patient ID: Gabriella Howe, female    DOB: Sep 07, 1934, 86 y.o.   MRN: [id number]  This visit occurred during the SARS-CoV-2 public health emergency.  Safety protocols were in place, including screening questions prior to the visit, additional usage of staff PPE, and extensive cleaning of exam room while observing appropriate contact time as indicated for disinfecting solutions.    HPI Here for health maintenance exam and to review chronic medical problems    Has amw scheduled on 5/10   Wt Readings from Last 3 Encounters:  07/08/20 206 lb 3 oz (93.5 kg)  02/17/20 205 lb (93 kg)  02/09/20 210 lb (95.3 kg)   40.27 kg/m  Still working  Feeling fairly good most of the time  Does not walk well, uses a cane Has lost hair with age    dexa- declines  (last one years ago and it was normal)  No family h/o OP  Falls - had a fall 3-4 mo ago  (no injury)  She is very very careful now  Not interested in PT for balance now, perhaps later  [facility]  Fractures - none  Supplements -vitamin D with ca  Exercise -none regular but she stays active and goes up and down 8 steps all day long  Carries 20 lb of pet food all the time   utd imms Had covid vaccines Zoster status -has had shingles  (4 times)   Declines breast cancer screening  Self breast exam - no lumps or changes   Declines colon cancer screening (for now)  Has had polyps in the past  Brother had colon cancer   HTN bp is stable today  No cp or palpitations or headaches or edema  No side effects to medicines  BP Readings from Last 3 Encounters:  07/08/20 128/76  03/30/20 130/87  02/26/20 (!) 187/95    Taking lotensin 20 mg daily and lasix 40 mg daily  DM2 Lab Results  Component Value Date   HGBA1C 7.6 (H) 07/01/2020   This is up from 7.1 Metformin 1000 mg bid glipizide xl 10 mg daily   Not checking her glucose lately   Does not eat as much as she used to  (in fact a  little nausea in ams)  Cut out sweets (cookie/coffee-no longer at night)  Drinks diet green tea   Was on 90 days of abx   Declines additional medicine for DM   Taking statin and ace [facility] utd   Hyperlipidemia Lab Results  Component Value Date   CHOL 121 07/01/2020   CHOL 117 10/02/2019   CHOL 141 05/08/2019   Lab Results  Component Value Date   HDL 53.00 07/01/2020   HDL 50.70 10/02/2019   HDL 59.60 05/08/2019   Lab Results  Component Value Date   LDLCALC 42 07/01/2020   LDLCALC 45 10/02/2019   LDLCALC 56 05/08/2019   Lab Results  Component Value Date   TRIG 128.0 07/01/2020   TRIG 107.0 10/02/2019   TRIG 128.0 05/08/2019   Lab Results  Component Value Date   CHOLHDL 2 07/01/2020   CHOLHDL 2 10/02/2019   CHOLHDL 2 05/08/2019   No results found for: LDLDIRECT   Simvastatin 40 mg daily  Avoids fried food Does not fry or use oil  Lab Results  Component Value Date   WBC 6.3 07/01/2020   HGB 14.0 07/01/2020   HCT 40.9 07/01/2020   MCV  91.4 07/01/2020   PLT 189.0 07/01/2020   Lab Results  Component Value Date   CREATININE 0.92 07/01/2020   BUN 21 07/01/2020   NA 142 07/01/2020   K 3.8 07/01/2020   CL 106 07/01/2020   CO2 26 07/01/2020   Lab Results  Component Value Date   ALT 15 07/01/2020   AST 17 07/01/2020   ALKPHOS 62 07/01/2020   BILITOT 1.3 (H) 07/01/2020    Lab Results  Component Value Date   TSH 1.13 07/01/2020    Patient Active Problem List   Diagnosis Date Noted  . Pedal edema 08/05/2019  . DJD (degenerative joint disease) 02/26/2019  . Iron deficiency anemia 04/30/2017  . History of shingles 10/03/2016  . Routine general medical examination at a health care facility 04/14/2015  . Back pain 07/28/2014  . Knee pain 07/28/2014  . Incomplete emptying of bladder 11/28/2012  . Frequency of urination 11/28/2012  . Encounter for Medicare annual wellness exam 11/19/2012  . History of kidney stones 11/19/2012  . DIASTOLIC HEART  FAILURE, CHRONIC 07/29/2009  . Hyperlipidemia associated with type 2 diabetes mellitus ([place]) 09/13/2006  . Morbid obesity ([place]) 09/10/2006  . CARPAL TUNNEL SYNDROME 09/10/2006  . Essential hypertension 09/10/2006  . DIASTOLIC DYSFUNCTION 46/96/2952  . REACTIVE AIRWAY DISEASE 09/10/2006  . GERD 09/10/2006  . ROSACEA 09/10/2006  . Degeneration of lumbar or lumbosacral intervertebral disc 09/10/2006  . OSA (obstructive sleep apnea) 09/10/2006  . COLONIC POLYPS, HX OF 09/10/2006  . Diabetes type 2, controlled ([place]) 09/06/2006   Past Medical History:  Diagnosis Date  . Adenomatous colon polyp 2003  . Benign neoplasm of colon   . Carpal tunnel syndrome    much better  . Chest pain, unspecified   . CHF (congestive heart failure) (HCC)    diastolic  . Complication of anesthesia    careful in positioning due to back pain  . Degeneration of intervertebral disc, site unspecified   . Diabetes mellitus    type II  . Encounter for long-term (current) use of other medications   . Fluid overload   . GERD (gastroesophageal reflux disease)   . Heart disease, unspecified   . History of kidney stones    with stent/hospitalized  . Hyperlipidemia   . Hypertension   . Obesity, unspecified   . Rosacea   . Shortness of breath   . Unspecified asthma(493.90)   . Unspecified sleep apnea    no cpap/ sleeps sitting up   Past Surgical History:  Procedure Laterality Date  . ABDOMINAL HYSTERECTOMY     partial, fibroids  . BOTOX INJECTION N/A 02/09/2020   Procedure: BOTOX INJECTION;  Surgeon: Hollice Espy, MD;  Location: [facility];  Service: Urology;  Laterality: N/A;  . CYSTOSCOPY WITH STENT PLACEMENT Left 02/18/2019   Procedure: CYSTOSCOPY WITH STENT PLACEMENT;  Surgeon: Abbie Sons, MD;  Location: [facility];  Service: Urology;  Laterality: Left;  . CYSTOSCOPY/URETEROSCOPY/HOLMIUM LASER/STENT PLACEMENT Left 04/01/2019   Procedure: CYSTOSCOPY/URETEROSCOPY/HOLMIUM LASER/STENT PLACEMENT;   Surgeon: Abbie Sons, MD;  Location: [facility];  Service: Urology;  Laterality: Left;  . ESOPHAGOGASTRODUODENOSCOPY  2000  . TONSILLECTOMY    . UMBILICAL HERNIA REPAIR  01/2004  . URETERAL STENT PLACEMENT  2/13   for L sided stone/ ecoli sepsis   Social History   Tobacco Use  . Smoking status: Never Smoker  . Smokeless tobacco: Never Used  Vaping Use  . Vaping Use: Never used  Substance Use Topics  . Alcohol use: No  Alcohol/week: 0.0 standard drinks  . Drug use: No   Family History  Problem Relation Age of Onset  . Early death Father 22       train accident  . Diabetes Mother   . Heart disease Mother   . Diabetes Sister   . Diabetes Brother   . Colon cancer Brother   . Cancer Paternal Grandmother        unknown  . Diabetes Sister    Allergies  Allergen Reactions  . Rofecoxib     REACTION: reaction not known  . Antihistamines, Chlorpheniramine-Type Palpitations  . Sulfa Antibiotics Rash  . Tetanus-Diphtheria Toxoids Td Other (See Comments)    REACTION: sick with fever   Current Outpatient Medications on File Prior to Visit  Medication Sig Dispense Refill  . Accu-Chek Softclix Lancets lancets USE AS DIRECTED TO CHECK BLOOD SUGAR ONCE DAILY AND AS NEEDED. (DX. E11.9) 100 each 0  . Ascorbic Acid (VITAMIN C) 500 MG tablet Take 500 mg by mouth daily.    . Aspirin-Caffeine (BC FAST PAIN RELIEF PO) Take 1 packet by mouth daily as needed (headache).    . CALCIUM-VITAMIN D PO Take 600 mg by mouth daily.    . cholecalciferol (VITAMIN D) 25 MCG (1000 UNIT) tablet Take 1,000 Units by mouth daily.    . Cyanocobalamin (B-12 PO) Take 1,000 mcg by mouth daily.     Marland Kitchen glucose blood (ACCU-CHEK AVIVA PLUS) test strip USE TO CHECK BLOOD SUGAR ONCE DAILY AND AS NEEDED (DX. E11.9) 50 each 1  . Multiple Vitamin (MULTIVITAMIN) capsule Take 1 capsule by mouth daily.    . naproxen sodium (ALEVE) 220 MG tablet Take 220 mg by mouth daily as needed (Pain).     . Omega-3 Fatty Acids (FISH  OIL PO) Take 1,000 mg by mouth daily.    . Pseudoephedrine HCl (SINUS & ALLERGY 12 HOUR PO) Take 1 tablet by mouth daily.     Marland Kitchen triamcinolone cream (KENALOG) 0.1 % APPLY TO AFFECTED AREA(S)  TOPICALLY TWO TIMES A DAY (Patient taking differently: Apply 1 application topically daily as needed (Cut/infection).) 30 g 1  . vitamin E 400 UNIT capsule Take 400 Units by mouth daily.     No current facility-administered medications on file prior to visit.     Review of Systems  Constitutional: Negative for activity change, appetite change, fatigue, fever and unexpected weight change.  HENT: Negative for congestion, ear pain, rhinorrhea, sinus pressure and sore throat.   Eyes: Negative for pain, redness and visual disturbance.  Respiratory: Negative for cough, shortness of breath and wheezing.   Cardiovascular: Negative for chest pain and palpitations.  Gastrointestinal: Negative for abdominal pain, blood in stool, constipation and diarrhea.  Endocrine: Negative for polydipsia and polyuria.  Genitourinary: Negative for dysuria, frequency and urgency.  Musculoskeletal: Positive for arthralgias. Negative for back pain and myalgias.  Skin: Negative for pallor and rash.  Allergic/Immunologic: Negative for environmental allergies.  Neurological: Negative for dizziness, syncope and headaches.       Worse balance with age  Hematological: Negative for adenopathy. Does not bruise/bleed easily.  Psychiatric/Behavioral: Negative for decreased concentration and dysphoric mood. The patient is not nervous/anxious.        Objective:   Physical Exam Constitutional:      General: She is not in acute distress.    Appearance: Normal appearance. She is well-developed. She is obese. She is not ill-appearing or diaphoretic.  HENT:     Head: Normocephalic and atraumatic.  Right Ear: Tympanic membrane, ear canal and external ear normal.     Left Ear: Tympanic membrane, ear canal and external ear normal.      Nose: Nose normal. No congestion.     Mouth/Throat:     Mouth: Mucous membranes are moist.     Pharynx: Oropharynx is clear. No posterior oropharyngeal erythema.  Eyes:     General: No scleral icterus.    Extraocular Movements: Extraocular movements intact.     Conjunctiva/sclera: Conjunctivae normal.     Pupils: Pupils are equal, round, and reactive to light.  Neck:     Thyroid: No thyromegaly.     Vascular: No carotid bruit or JVD.  Cardiovascular:     Rate and Rhythm: Normal rate and regular rhythm.     Pulses: Normal pulses.     Heart sounds: Normal heart sounds. No gallop.   Pulmonary:     Effort: Pulmonary effort is normal. No respiratory distress.     Breath sounds: Normal breath sounds. No wheezing.     Comments: Good air exch Chest:     Chest wall: No tenderness.  Abdominal:     General: Bowel sounds are normal. There is no distension or abdominal bruit.     Palpations: Abdomen is soft. There is no mass.     Tenderness: There is no abdominal tenderness.     Hernia: No hernia is present.  Genitourinary:    Comments: Declines breast exam Musculoskeletal:        General: No tenderness. Normal range of motion.     Cervical back: Normal range of motion and neck supple. No rigidity. No muscular tenderness.     Right lower leg: No edema.     Left lower leg: No edema.  Lymphadenopathy:     Cervical: No cervical adenopathy.  Skin:    General: Skin is warm and dry.     Coloration: Skin is not pale.     Findings: No erythema or rash.     Comments: Solar lentigines diffusely   Neurological:     Mental Status: She is alert. Mental status is at baseline.     Cranial Nerves: No cranial nerve deficit.     Motor: No abnormal muscle tone.     Coordination: Coordination normal.     Gait: Gait normal.     Deep Tendon Reflexes: Reflexes are normal and symmetric. Reflexes normal.  Psychiatric:        Mood and Affect: Mood normal.        Cognition and Memory: Cognition and memory  normal.           Assessment & Plan:   Problem List Items Addressed This Visit      Cardiovascular and Mediastinum   Essential hypertension    bp in fair control at this time  BP Readings from Last 1 Encounters:  07/08/20 128/76   No changes needed Most recent labs reviewed  Disc lifstyle change with low sodium diet and exercise  Plan to continue benazepril 20 mg daily and lasix 40 mg daily      Relevant Medications   simvastatin (ZOCOR) 40 MG tablet   furosemide (LASIX) 40 MG tablet   benazepril (LOTENSIN) 20 MG tablet   DIASTOLIC HEART FAILURE, CHRONIC   Relevant Medications   simvastatin (ZOCOR) 40 MG tablet   furosemide (LASIX) 40 MG tablet   benazepril (LOTENSIN) 20 MG tablet     Endocrine   Diabetes type 2, controlled ([place])  Lab Results  Component Value Date   HGBA1C 7.6 (H) 07/01/2020   This is up from 7.1 Pt is apprehensive to add medication  Thinks she can do better with lifestyle change  Taking statin and ace  Disc low glycemic diet  F/u 3 mo      Relevant Medications   simvastatin (ZOCOR) 40 MG tablet   metFORMIN (GLUCOPHAGE) 1000 MG tablet   glipiZIDE (GLUCOTROL XL) 10 MG 24 hr tablet   benazepril (LOTENSIN) 20 MG tablet   Hyperlipidemia associated with type 2 diabetes mellitus (HCC)    Disc goals for lipids and reasons to control them Rev last labs with pt Rev low sat fat diet in detail Plan to continue simvastatin 40 mg daily       Relevant Medications   simvastatin (ZOCOR) 40 MG tablet   metFORMIN (GLUCOPHAGE) 1000 MG tablet   glipiZIDE (GLUCOTROL XL) 10 MG 24 hr tablet   benazepril (LOTENSIN) 20 MG tablet     Other   Morbid obesity (HCC)    Discussed how this problem influences overall health and the risks it imposes  Reviewed plan for weight loss with lower calorie diet (via better food choices and also portion control or program like weight watchers) and exercise building up to or more than 30 minutes 5 days per week including  some aerobic activity         Relevant Medications   metFORMIN (GLUCOPHAGE) 1000 MG tablet   glipiZIDE (GLUCOTROL XL) 10 MG 24 hr tablet   Routine general medical examination at a health care facility - Primary    Reviewed health habits including diet and exercise and skin cancer prevention Reviewed appropriate screening tests for age  Also reviewed health mt list, fam hx and immunization status , as well as social and family history   See HPI Labs reviewed  amw scheduled  Declines dexa  One fall, no fractures (declines PT for balance) and fair exercise  Enc ca and D supplementation  Interested in shingrix if affordable  Declines breast or colon cancer screening

## 2020-07-08 NOTE — Assessment & Plan Note (Signed)
Reviewed health habits including diet and exercise and skin cancer prevention Reviewed appropriate screening tests for age  Also reviewed health mt list, fam hx and immunization status , as well as social and family history   See HPI Labs reviewed  amw scheduled  Declines dexa  One fall, no fractures (declines PT for balance) and fair exercise  Enc ca and D supplementation  Interested in shingrix if affordable  Declines breast or colon cancer screening

## 2020-07-08 NOTE — Assessment & Plan Note (Signed)
Disc goals for lipids and reasons to control them Rev last labs with pt Rev low sat fat diet in detail Plan to continue simvastatin 40 mg daily

## 2020-07-08 NOTE — Patient Instructions (Addendum)
If you are interested in the new shingles vaccine (Shingrix) - call your local pharmacy to check on coverage and availability  If affordable, get on a wait list at your pharmacy to get the vaccine. I highly recommend it when you can get it   Try to get most of your carbohydrates from produce (with the exception of white potatoes)  Eat less bread/pasta/rice/snack foods/cereals/sweets and other items from the middle of the grocery store (processed carbs)  Start checking blood sugar again (at different times of day)  Keep track of what you eat   Stay active also   Take care of yourself   Follow up in 3 months

## 2020-07-13 ENCOUNTER — Other Ambulatory Visit: Payer: Self-pay | Admitting: *Deleted

## 2020-07-13 DIAGNOSIS — R300 Dysuria: Secondary | ICD-10-CM

## 2020-08-02 ENCOUNTER — Other Ambulatory Visit: Payer: Self-pay

## 2020-08-03 ENCOUNTER — Other Ambulatory Visit: Payer: Self-pay

## 2020-08-03 ENCOUNTER — Ambulatory Visit (INDEPENDENT_AMBULATORY_CARE_PROVIDER_SITE_OTHER): Payer: Medicare Other

## 2020-08-03 DIAGNOSIS — Z0000 Encounter for general adult medical examination without abnormal findings: Secondary | ICD-10-CM

## 2020-08-03 NOTE — Progress Notes (Signed)
Subjective:   Gabriella Howe is a 86 y.o. female who presents for Medicare Annual (Subsequent) preventive examination.  Review of Systems: N/A      I connected with the patient today by telephone and verified that I am speaking with the correct person using two identifiers. Location patient: home Location nurse: work Persons participating in the telephone visit: patient, nurse.   I discussed the limitations, risks, security and privacy concerns of performing an evaluation and management service by telephone and the availability of in person appointments. I also discussed with the patient that there may be a patient responsible charge related to this service. The patient expressed understanding and verbally consented to this telephonic visit.        Cardiac Risk Factors include: advanced age (>10men, >50 women);diabetes mellitus;hypertension;Other (see comment), Risk factor comments: hyperlipidemia     Objective:    Today's Vitals   There is no height or weight on file to calculate BMI.  Advanced Directives 08/03/2020 02/09/2020 01/30/2020 05/08/2019 04/01/2019 03/26/2019 02/18/2019  Does Patient Have a Medical Advance Directive? Yes Yes Yes Yes Yes Yes Yes  Type of Paramedic of [place];Living will [place];Living will [place];Living will [place];Living will [place];Living will [place];Living will [place];Living will  Does patient want to make changes to medical advance directive? - No - Patient declined No - Patient declined - No - Patient declined - No - Patient declined  Copy of [place] in Chart? No - copy requested Yes - validated most recent copy scanned in chart (See row information) Yes - validated most recent copy scanned in chart (See row information) No - copy requested No - copy requested No - copy requested -   Would patient like information on creating a medical advance directive? - No - Patient declined No - Patient declined - - - -    Current Medications (verified) Outpatient Encounter Medications as of 08/03/2020  Medication Sig  . Accu-Chek Softclix Lancets lancets USE AS DIRECTED TO CHECK BLOOD SUGAR ONCE DAILY AND AS NEEDED. (DX. E11.9)  . Ascorbic Acid (VITAMIN C) 500 MG tablet Take 500 mg by mouth daily.  . Aspirin-Caffeine (BC FAST PAIN RELIEF PO) Take 1 packet by mouth daily as needed (headache).  . benazepril (LOTENSIN) 20 MG tablet Take 1 tablet (20 mg total) by mouth daily.  Marland Kitchen CALCIUM-VITAMIN D PO Take 600 mg by mouth daily.  . cholecalciferol (VITAMIN D) 25 MCG (1000 UNIT) tablet Take 1,000 Units by mouth daily.  . Cyanocobalamin (B-12 PO) Take 1,000 mcg by mouth daily.   . furosemide (LASIX) 40 MG tablet Take 1 tablet (40 mg total) by mouth daily.  Marland Kitchen glipiZIDE (GLUCOTROL XL) 10 MG 24 hr tablet Take 1 tablet (10 mg total) by mouth daily with breakfast.  . glucose blood (ACCU-CHEK AVIVA PLUS) test strip USE TO CHECK BLOOD SUGAR ONCE DAILY AND AS NEEDED (DX. E11.9)  . metFORMIN (GLUCOPHAGE) 1000 MG tablet Take 1 tablet (1,000 mg total) by mouth 2 (two) times daily with a meal.  . Multiple Vitamin (MULTIVITAMIN) capsule Take 1 capsule by mouth daily.  . naproxen sodium (ALEVE) 220 MG tablet Take 220 mg by mouth daily as needed (Pain).   . Omega-3 Fatty Acids (FISH OIL PO) Take 1,000 mg by mouth daily.  Marland Kitchen omeprazole (PRILOSEC) 20 MG capsule Take 1 capsule (20 mg total) by mouth daily.  Marland Kitchen  Pseudoephedrine HCl (SINUS & ALLERGY 12 HOUR PO) Take 1 tablet by mouth daily.   . simvastatin (ZOCOR) 40 MG tablet Take 1 tablet (40 mg total) by mouth at bedtime.  . triamcinolone cream (KENALOG) 0.1 % APPLY TO AFFECTED AREA(S)  TOPICALLY TWO TIMES A DAY (Patient taking differently: Apply 1 application topically daily as needed (Cut/infection).)  . vitamin E 400 UNIT capsule Take 400 Units by mouth  daily.   No facility-administered encounter medications on file as of 08/03/2020.    Allergies (verified) Rofecoxib; Antihistamines, chlorpheniramine-type; Sulfa antibiotics; and Tetanus-diphtheria toxoids td   History: Past Medical History:  Diagnosis Date  . Adenomatous colon polyp 2003  . Benign neoplasm of colon   . Carpal tunnel syndrome    much better  . Chest pain, unspecified   . CHF (congestive heart failure) (HCC)    diastolic  . Complication of anesthesia    careful in positioning due to back pain  . Degeneration of intervertebral disc, site unspecified   . Diabetes mellitus    type II  . Encounter for long-term (current) use of other medications   . Fluid overload   . GERD (gastroesophageal reflux disease)   . Heart disease, unspecified   . History of kidney stones    with stent/hospitalized  . Hyperlipidemia   . Hypertension   . Obesity, unspecified   . Rosacea   . Shortness of breath   . Unspecified asthma(493.90)   . Unspecified sleep apnea    no cpap/ sleeps sitting up   Past Surgical History:  Procedure Laterality Date  . ABDOMINAL HYSTERECTOMY     partial, fibroids  . BOTOX INJECTION N/A 02/09/2020   Procedure: BOTOX INJECTION;  Surgeon: Hollice Espy, MD;  Location: [facility] ORS;  Service: Urology;  Laterality: N/A;  . CYSTOSCOPY WITH STENT PLACEMENT Left 02/18/2019   Procedure: CYSTOSCOPY WITH STENT PLACEMENT;  Surgeon: Abbie Sons, MD;  Location: [facility] ORS;  Service: Urology;  Laterality: Left;  . CYSTOSCOPY/URETEROSCOPY/HOLMIUM LASER/STENT PLACEMENT Left 04/01/2019   Procedure: CYSTOSCOPY/URETEROSCOPY/HOLMIUM LASER/STENT PLACEMENT;  Surgeon: Abbie Sons, MD;  Location: [facility] ORS;  Service: Urology;  Laterality: Left;  . ESOPHAGOGASTRODUODENOSCOPY  2000  . TONSILLECTOMY    . UMBILICAL HERNIA REPAIR  01/2004  . URETERAL STENT PLACEMENT  2/13   for L sided stone/ ecoli sepsis   Family History  Problem Relation Age of Onset  . Early death  Father 35       train accident  . Diabetes Mother   . Heart disease Mother   . Diabetes Sister   . Diabetes Brother   . Colon cancer Brother   . Cancer Paternal Grandmother        unknown  . Diabetes Sister    Social History   Socioeconomic History  . Marital status: Widowed    Spouse name: Not on file  . Number of children: Not on file  . Years of education: Not on file  . Highest education level: Not on file  Occupational History  . Not on file  Tobacco Use  . Smoking status: Never Smoker  . Smokeless tobacco: Never Used  Vaping Use  . Vaping Use: Never used  Substance and Sexual Activity  . Alcohol use: No    Alcohol/week: 0.0 standard drinks  . Drug use: No  . Sexual activity: Never  Other Topics Concern  . Not on file  Social History Narrative  . Not on file   Social Determinants of Health   Financial  Resource Strain: Low Risk   . Difficulty of Paying Living Expenses: Not hard at all  Food Insecurity: No Food Insecurity  . Worried About Charity fundraiser in the Last Year: Never true  . Ran Out of Food in the Last Year: Never true  Transportation Needs: No Transportation Needs  . Lack of Transportation (Medical): No  . Lack of Transportation (Non-Medical): No  Physical Activity: Inactive  . Days of Exercise per Week: 0 days  . Minutes of Exercise per Session: 0 min  Stress: No Stress Concern Present  . Feeling of Stress : Not at all  Social Connections: Not on file    Tobacco Counseling Counseling given: Not Answered   Clinical Intake:  Pre-visit preparation completed: Yes  Pain : No/denies pain     Nutritional Risks: None Diabetes: Yes CBG done?: No Did pt. bring in CBG monitor from home?: No  How often do you need to have someone help you when you read instructions, pamphlets, or other written materials from your doctor or pharmacy?: 1 - Never What is the last grade level you completed in school?: GED  Diabetic: Yes Nutrition Risk  Assessment:  Has the patient had any N/V/D within the last 2 months?  No  Does the patient have any non-healing wounds?  No  Has the patient had any unintentional weight loss or weight gain?  No   Diabetes:  Is the patient diabetic?  Yes  If diabetic, was a CBG obtained today?  No  telephone visit  Did the patient bring in their glucometer from home?  No  telephone visit  How often do you monitor your CBG's? never.   Financial Strains and Diabetes Management:  Are you having any financial strains with the device, your supplies or your medication? No .  Does the patient want to be seen by Chronic Care Management for management of their diabetes?  No  Would the patient like to be referred to a Nutritionist or for Diabetic Management?  No   Diabetic Exams:  Diabetic Eye Exam: Completed 10/02/2019 Diabetic Foot Exam: Completed 01/20/2020   Interpreter Needed?: No  Information entered by :: CJohnson, LPN   Activities of Daily Living In your present state of health, do you have any difficulty performing the following activities: 08/03/2020 01/30/2020  Hearing? N N  Vision? N N  Difficulty concentrating or making decisions? N N  Walking or climbing stairs? N Y  Comment - poor balance  Dressing or bathing? N N  Doing errands, shopping? N N  Preparing Food and eating ? N -  Using the Toilet? N -  In the past six months, have you accidently leaked urine? N -  Do you have problems with loss of bowel control? N -  Managing your Medications? N -  Managing your Finances? N -  Housekeeping or managing your Housekeeping? N -  Some recent data might be hidden    Patient Care Team: Tower, Wynelle Fanny, MD as PCP - General Vin-Parikh, Deirdre Peer, MD as Referring Physician (Ophthalmology) Charmian Muff, [place] as Referring Physician (Dentistry) Debbora Dus, [facility] as Pharmacist (Pharmacist)  Indicate any recent Medical Services you may have received from other than [facility] providers in  the past year (date may be approximate).     Assessment:   This is a routine wellness examination for [place].  Hearing/Vision screen  Hearing Screening   125Hz  250Hz  500Hz  1000Hz  2000Hz  3000Hz  4000Hz  6000Hz  8000Hz   Right ear:  Left ear:           Vision Screening Comments: Patient gets annual eye exams   Dietary issues and exercise activities discussed: Exercise limited by: None identified  Goals Addressed            This Visit's Progress   . Patient Stated       08/03/2020, I will maintain and continue medications as prescribed.       Depression Screen PHQ 2/9 Scores 08/03/2020 05/08/2019 05/06/2018 04/23/2017 04/20/2016 04/14/2015 11/28/2013  PHQ - 2 Score 0 0 0 1 1 0 0  PHQ- 9 Score 0 0 0 1 - - -    Fall Risk Fall Risk  08/03/2020 05/08/2019 05/06/2018 12/24/2017 04/23/2017  Falls in the past year? 1 1 0 - No  Comment - loss balance and fell - - -  Number falls in past yr: 0 0 - - -  Injury with Fall? 0 0 - - -  Risk for fall due to : Impaired balance/gait;History of fall(s) Medication side effect - Impaired balance/gait -  Follow up Falls evaluation completed;Falls prevention discussed Falls prevention discussed;Falls evaluation completed - - -    FALL RISK PREVENTION PERTAINING TO THE HOME:  Any stairs in or around the home? Yes  If so, are there any without handrails? No  Home free of loose throw rugs in walkways, pet beds, electrical cords, etc? Yes  Adequate lighting in your home to reduce risk of falls? Yes   ASSISTIVE DEVICES UTILIZED TO PREVENT FALLS:  Life alert? Yes Use of a cane, walker or w/c? Yes  Grab bars in the bathroom? No  Shower chair or bench in shower? No  Elevated toilet seat or a handicapped toilet? No   TIMED UP AND GO:  Was the test performed? N/A telephone visit .   Cognitive Function: MMSE - Mini Mental State Exam 08/03/2020 05/08/2019 05/06/2018 04/23/2017 04/20/2016  Orientation to time 5 5 5 5 5   Orientation to Place 5 5 5 5 5    Registration 3 3 3 3 3   Attention/ Calculation 5 5 0 0 0  Recall 3 3 2 3 2   Recall-comments - - unable to recall 2 of 3 words - pt was unable to recall 1 of 3 words  Language- name 2 objects - - 0 0 0  Language- repeat 1 1 1 1 1   Language- follow 3 step command - - 3 3 3   Language- read & follow direction - - 0 0 0  Write a sentence - - 0 0 0  Copy design - - 0 0 0  Total score - - 19 20 19   Mini Cog  Mini-Cog screen was completed. Maximum score is 22. A value of 0 denotes this part of the MMSE was not completed or the patient failed this part of the Mini-Cog screening.       Immunizations Immunization History  Administered Date(s) Administered  . Fluad Quad(high Dose 65+) 01/20/2020  . Influenza Split 01/24/2011  . Influenza Whole 05/10/2009  . Influenza,inj,Quad PF,6+ Mos 11/28/2013, 04/14/2015, 04/20/2016, 01/26/2017, 05/06/2018  . PFIZER(Purple Top)SARS-COV-2 Vaccination 05/02/2019, 05/23/2019, 03/16/2020  . Pneumococcal Conjugate-13 11/28/2013  . Pneumococcal Polysaccharide-23 03/28/2003    TDAP status: allergic  Flu Vaccine status: Up to date  Pneumococcal vaccine status: Up to date  Covid-19 vaccine status: Completed vaccines  Qualifies for Shingles Vaccine? Yes   Zostavax completed No   Shingrix Completed?: No.    Education has been provided regarding the importance  of this vaccine. Patient has been advised to call insurance company to determine out of pocket expense if they have not yet received this vaccine. Advised may also receive vaccine at local pharmacy or Health Dept. Verbalized acceptance and understanding.  Screening Tests Health Maintenance  Topic Date Due  . OPHTHALMOLOGY EXAM  10/01/2020  . INFLUENZA VACCINE  10/25/2020  . HEMOGLOBIN A1C  12/31/2020  . FOOT EXAM  01/19/2021  . DEXA SCAN  Completed  . COVID-19 Vaccine  Completed  . PNA vac Low Risk Adult  Completed  . HPV VACCINES  Aged Out    Health Maintenance  There are no preventive  care reminders to display for this patient.  Colorectal cancer screening: No longer required.   Mammogram status: No longer required due to age.  Bone Density status: declined  Lung Cancer Screening: (Low Dose CT Chest recommended if Age 38-80 years, 30 pack-year currently smoking OR have quit w/in 15 years.) does not qualify.  Additional Screening:  Hepatitis C Screening: does not qualify; Completed N/A  Vision Screening: Recommended annual ophthalmology exams for early detection of glaucoma and other disorders of the eye. Is the patient up to date with their annual eye exam?  Yes  Who is the provider or what is the name of the office in which the patient attends annual eye exams? Dr, [place], [facility] If pt is not established with a provider, would they like to be referred to a provider to establish care? No .   Dental Screening: Recommended annual dental exams for proper oral hygiene  Community Resource Referral / Chronic Care Management: CRR required this visit?  No   CCM required this visit?  No      Plan:     I have personally reviewed and noted the following in the patient's chart:   . Medical and social history . Use of alcohol, tobacco or illicit drugs  . Current medications and supplements including opioid prescriptions.  . Functional ability and status . Nutritional status . Physical activity . Advanced directives . List of other physicians . Hospitalizations, surgeries, and ER visits in previous 12 months . Vitals . Screenings to include cognitive, depression, and falls . Referrals and appointments  In addition, I have reviewed and discussed with patient certain preventive protocols, quality metrics, and best practice recommendations. A written personalized care plan for preventive services as well as general preventive health recommendations were provided to patient.   Due to this being a telephonic visit, the after visit summary with  patients personalized plan was offered to patient via office or my-chart. Patient preferred to pick up at office at next visit or via mychart.   Andrez Grime, LPN   [id number]

## 2020-08-03 NOTE — Patient Instructions (Signed)
[name] , Thank you for taking time to come for your Medicare Wellness Visit. I appreciate your ongoing commitment to your health goals. Please review the following plan we discussed and let me know if I can assist you in the future.   Screening recommendations/referrals: Colonoscopy: Up to date, completed 09/05/2017, no longer required  Mammogram: no longer required  Bone Density: declined Recommended yearly ophthalmology/optometry visit for glaucoma screening and checkup Recommended yearly dental visit for hygiene and checkup  Vaccinations: Influenza vaccine: Up to date, completed 01/20/2020, due 10/2020 Pneumococcal vaccine: Completed series Tdap vaccine: allergic Shingles vaccine: due, check with your insurance regarding coverage if interested   Covid-19:Completed series  Advanced directives: Please bring a copy of your POA (Power of Attorney) and/or Living Will to your next appointment.   Conditions/risks identified: diabetes, hypertension, hyperlipidemia   Next appointment: Follow up in one year for your annual wellness visit    Preventive Care 84 Years and Older, Female Preventive care refers to lifestyle choices and visits with your health care provider that can promote health and wellness. What does preventive care include?  A yearly physical exam. This is also called an annual well check.  Dental exams once or twice a year.  Routine eye exams. Ask your health care provider how often you should have your eyes checked.  Personal lifestyle choices, including:  Daily care of your teeth and gums.  Regular physical activity.  Eating a healthy diet.  Avoiding tobacco and drug use.  Limiting alcohol use.  Practicing safe sex.  Taking low-dose aspirin every day.  Taking vitamin and mineral supplements as recommended by your health care provider. What happens during an annual well check? The services and screenings done by your health care provider during your  annual well check will depend on your age, overall health, lifestyle risk factors, and family history of disease. Counseling  Your health care provider may ask you questions about your:  Alcohol use.  Tobacco use.  Drug use.  Emotional well-being.  Home and relationship well-being.  Sexual activity.  Eating habits.  History of falls.  Memory and ability to understand (cognition).  Work and work Statistician.  Reproductive health. Screening  You may have the following tests or measurements:  Height, weight, and BMI.  Blood pressure.  Lipid and cholesterol levels. These may be checked every 5 years, or more frequently if you are over 64 years old.  Skin check.  Lung cancer screening. You may have this screening every year starting at age 30 if you have a 30-pack-year history of smoking and currently smoke or have quit within the past 15 years.  Fecal occult blood test (FOBT) of the stool. You may have this test every year starting at age 29.  Flexible sigmoidoscopy or colonoscopy. You may have a sigmoidoscopy every 5 years or a colonoscopy every 10 years starting at age 50.  Hepatitis C blood test.  Hepatitis B blood test.  Sexually transmitted disease (STD) testing.  Diabetes screening. This is done by checking your blood sugar (glucose) after you have not eaten for a while (fasting). You may have this done every 1-3 years.  Bone density scan. This is done to screen for osteoporosis. You may have this done starting at age 56.  Mammogram. This may be done every 1-2 years. Talk to your health care provider about how often you should have regular mammograms. Talk with your health care provider about your test results, treatment options, and if necessary, the need  for more tests. Vaccines  Your health care provider may recommend certain vaccines, such as:  Influenza vaccine. This is recommended every year.  Tetanus, diphtheria, and acellular pertussis (Tdap, Td)  vaccine. You may need a Td booster every 10 years.  Zoster vaccine. You may need this after age 62.  Pneumococcal 13-valent conjugate (PCV13) vaccine. One dose is recommended after age 32.  Pneumococcal polysaccharide (PPSV23) vaccine. One dose is recommended after age 67. Talk to your health care provider about which screenings and vaccines you need and how often you need them. This information is not intended to replace advice given to you by your health care provider. Make sure you discuss any questions you have with your health care provider. Document Released: 04/09/2015 Document Revised: 12/01/2015 Document Reviewed: 01/12/2015 Elsevier Interactive Patient Education  2017 [place] Prevention in the Home Falls can cause injuries. They can happen to people of all ages. There are many things you can do to make your home safe and to help prevent falls. What can I do on the outside of my home?  Regularly fix the edges of walkways and driveways and fix any cracks.  Remove anything that might make you trip as you walk through a door, such as a raised step or threshold.  Trim any bushes or trees on the path to your home.  Use bright outdoor lighting.  Clear any walking paths of anything that might make someone trip, such as rocks or tools.  Regularly check to see if handrails are loose or broken. Make sure that both sides of any steps have handrails.  Any raised decks and porches should have guardrails on the edges.  Have any leaves, snow, or ice cleared regularly.  Use sand or salt on walking paths during winter.  Clean up any spills in your garage right away. This includes oil or grease spills. What can I do in the bathroom?  Use night lights.  Install grab bars by the toilet and in the tub and shower. Do not use towel bars as grab bars.  Use non-skid mats or decals in the tub or shower.  If you need to sit down in the shower, use a plastic, non-slip  stool.  Keep the floor dry. Clean up any water that spills on the floor as soon as it happens.  Remove soap buildup in the tub or shower regularly.  Attach bath mats securely with double-sided non-slip rug tape.  Do not have throw rugs and other things on the floor that can make you trip. What can I do in the bedroom?  Use night lights.  Make sure that you have a light by your bed that is easy to reach.  Do not use any sheets or blankets that are too big for your bed. They should not hang down onto the floor.  Have a firm chair that has side arms. You can use this for support while you get dressed.  Do not have throw rugs and other things on the floor that can make you trip. What can I do in the kitchen?  Clean up any spills right away.  Avoid walking on wet floors.  Keep items that you use a lot in easy-to-reach places.  If you need to reach something above you, use a strong step stool that has a grab bar.  Keep electrical cords out of the way.  Do not use floor polish or wax that makes floors slippery. If you must use wax, use  non-skid floor wax.  Do not have throw rugs and other things on the floor that can make you trip. What can I do with my stairs?  Do not leave any items on the stairs.  Make sure that there are handrails on both sides of the stairs and use them. Fix handrails that are broken or loose. Make sure that handrails are as long as the stairways.  Check any carpeting to make sure that it is firmly attached to the stairs. Fix any carpet that is loose or worn.  Avoid having throw rugs at the top or bottom of the stairs. If you do have throw rugs, attach them to the floor with carpet tape.  Make sure that you have a light switch at the top of the stairs and the bottom of the stairs. If you do not have them, ask someone to add them for you. What else can I do to help prevent falls?  Wear shoes that:  Do not have high heels.  Have rubber bottoms.  Are  comfortable and fit you well.  Are closed at the toe. Do not wear sandals.  If you use a stepladder:  Make sure that it is fully opened. Do not climb a closed stepladder.  Make sure that both sides of the stepladder are locked into place.  Ask someone to hold it for you, if possible.  Clearly mark and make sure that you can see:  Any grab bars or handrails.  First and last steps.  Where the edge of each step is.  Use tools that help you move around (mobility aids) if they are needed. These include:  Canes.  Walkers.  Scooters.  Crutches.  Turn on the lights when you go into a dark area. Replace any light bulbs as soon as they burn out.  Set up your furniture so you have a clear path. Avoid moving your furniture around.  If any of your floors are uneven, fix them.  If there are any pets around you, be aware of where they are.  Review your medicines with your doctor. Some medicines can make you feel dizzy. This can increase your chance of falling. Ask your doctor what other things that you can do to help prevent falls. This information is not intended to replace advice given to you by your health care provider. Make sure you discuss any questions you have with your health care provider. Document Released: 01/07/2009 Document Revised: 08/19/2015 Document Reviewed: 04/17/2014 Elsevier Interactive Patient Education  2017 [employer].

## 2020-08-03 NOTE — Progress Notes (Signed)
PCP notes:  Health Maintenance: No gaps noted   Abnormal Screenings: none   Patient concerns: none   Nurse concerns: none   Next PCP appt: 10/12/2020 @ 11:30 am

## 2020-08-10 ENCOUNTER — Ambulatory Visit: Payer: Self-pay | Admitting: Urology

## 2020-09-22 DIAGNOSIS — E119 Type 2 diabetes mellitus without complications: Secondary | ICD-10-CM | POA: Diagnosis not present

## 2020-09-22 LAB — HM DIABETES EYE EXAM

## 2020-09-23 ENCOUNTER — Other Ambulatory Visit: Payer: Self-pay | Admitting: Family Medicine

## 2020-09-23 ENCOUNTER — Encounter: Payer: Self-pay | Admitting: Family Medicine

## 2020-10-12 ENCOUNTER — Ambulatory Visit (INDEPENDENT_AMBULATORY_CARE_PROVIDER_SITE_OTHER): Payer: Medicare Other | Admitting: Family Medicine

## 2020-10-12 ENCOUNTER — Encounter: Payer: Self-pay | Admitting: Family Medicine

## 2020-10-12 ENCOUNTER — Other Ambulatory Visit: Payer: Self-pay

## 2020-10-12 VITALS — BP 134/72 | HR 89 | Temp 97.9°F | Ht 60.0 in | Wt 204.1 lb

## 2020-10-12 DIAGNOSIS — E785 Hyperlipidemia, unspecified: Secondary | ICD-10-CM | POA: Diagnosis not present

## 2020-10-12 DIAGNOSIS — E119 Type 2 diabetes mellitus without complications: Secondary | ICD-10-CM | POA: Diagnosis not present

## 2020-10-12 DIAGNOSIS — E1169 Type 2 diabetes mellitus with other specified complication: Secondary | ICD-10-CM

## 2020-10-12 DIAGNOSIS — I10 Essential (primary) hypertension: Secondary | ICD-10-CM | POA: Diagnosis not present

## 2020-10-12 LAB — POCT GLYCOSYLATED HEMOGLOBIN (HGB A1C): Hemoglobin A1C: 6.7 % — AB (ref 4.0–5.6)

## 2020-10-12 NOTE — Assessment & Plan Note (Signed)
Disc goals for lipids and reasons to control them Rev last labs with pt Rev low sat fat diet in detail  Plan to continue simvastatin 40mg  daily and tolerates well

## 2020-10-12 NOTE — Assessment & Plan Note (Addendum)
Lab Results  Component Value Date   HGBA1C 6.7 (A) 10/12/2020   Much improved with better diet  Less processed carbs Stopped pm snack  Plan to continue metformin 1000 mg bid and glipizide xl 10 mg daily  Will try to add more exercise  On ace and statin  Eye exam utd, rev report no retinopathy Good foot care, rev exam and self care F/u 3 mo

## 2020-10-12 NOTE — Patient Instructions (Addendum)
Exercise may increase your energy level  Work up to at least 30 minutes per day   What ever works with your schedule  Indoors when hot  Keep eating better-it is helping your blood glucose

## 2020-10-12 NOTE — Assessment & Plan Note (Signed)
bp in fair control at this time  BP Readings from Last 1 Encounters:  10/12/20 134/72   No changes needed Most recent labs reviewed  Disc lifstyle change with low sodium diet and exercise  Plan to continue Benazepril 20 mg daily  Lasix 40 mg daily

## 2020-10-12 NOTE — Progress Notes (Signed)
Subjective:    Patient ID: Gabriella Howe, female    DOB: 07-Feb-1935, 86 y.o.   MRN: [id number]  This visit occurred during the SARS-CoV-2 public health emergency.  Safety protocols were in place, including screening questions prior to the visit, additional usage of staff PPE, and extensive cleaning of exam room while observing appropriate contact time as indicated for disinfecting solutions.   HPI Pt presents for f/u of chronic health issues including DM and HTN   Wt Readings from Last 3 Encounters:  10/12/20 204 lb 2 oz (92.6 kg)  07/08/20 206 lb 3 oz (93.5 kg)  02/17/20 205 lb (93 kg)   39.87 kg/m  Still working out of her house  Feels ok overall  Tired all the time  Does not think her work is too much   Regular activity -no exercise  Still goes up and down the steps   Is considering exercise equip    HTN bp is stable today  No cp or palpitations or headaches or edema  No side effects to medicines  BP Readings from Last 3 Encounters:  10/12/20 134/72  07/08/20 128/76  03/30/20 130/87     Taking benazepril 20 mg daily  Lasix 40 mg daily   DM2 Lab Results  Component Value Date   HGBA1C 7.6 (H) 07/01/2020   Up from 7.1 the time before  She was apprehensive to add medication  Wanted to work on lifestyle change She cut out the 8 pm snack  Cooking more and eating out less  Less sugar and processed carbs (still some sandwiches)   Taking statin and ace  Eye exam in June   Lab Results  Component Value Date   HGBA1C 6.7 (A) 10/12/2020  Much better  Numbers are good at home  Highest 140s in am   Metformin 1000 mg bid  Glipizide xl 10 mg daily   Hyperlipidemia Lab Results  Component Value Date   CHOL 121 07/01/2020   HDL 53.00 07/01/2020   LDLCALC 42 07/01/2020   TRIG 128.0 07/01/2020   CHOLHDL 2 07/01/2020  Good LDL control Taking simvastatin 40 mg daily and tolerates well    Patient Active Problem List   Diagnosis Date Noted   Pedal edema  08/05/2019   DJD (degenerative joint disease) 02/26/2019   Iron deficiency anemia 04/30/2017   History of shingles 10/03/2016   Routine general medical examination at a health care facility 04/14/2015   Back pain 07/28/2014   Knee pain 07/28/2014   Incomplete emptying of bladder 11/28/2012   Frequency of urination 11/28/2012   Encounter for Medicare annual wellness exam 11/19/2012   History of kidney stones 25/42/7062   DIASTOLIC HEART FAILURE, CHRONIC 07/29/2009   Hyperlipidemia associated with type 2 diabetes mellitus ([place]) 09/13/2006   Morbid obesity ([place]) 09/10/2006   CARPAL TUNNEL SYNDROME 09/10/2006   Essential hypertension 37/62/8315   DIASTOLIC DYSFUNCTION 17/61/6073   REACTIVE AIRWAY DISEASE 09/10/2006   GERD 09/10/2006   ROSACEA 09/10/2006   Degeneration of lumbar or lumbosacral intervertebral disc 09/10/2006   OSA (obstructive sleep apnea) 09/10/2006   COLONIC POLYPS, HX OF 09/10/2006   Diabetes type 2, controlled ([place]) 09/06/2006   Past Medical History:  Diagnosis Date   Adenomatous colon polyp 2003   Benign neoplasm of colon    Carpal tunnel syndrome    much better   Chest pain, unspecified    CHF (congestive heart failure) (HCC)    diastolic   Complication of anesthesia  careful in positioning due to back pain   Degeneration of intervertebral disc, site unspecified    Diabetes mellitus    type II   Encounter for long-term (current) use of other medications    Fluid overload    GERD (gastroesophageal reflux disease)    Heart disease, unspecified    History of kidney stones    with stent/hospitalized   Hyperlipidemia    Hypertension    Obesity, unspecified    Rosacea    Shortness of breath    Unspecified asthma(493.90)    Unspecified sleep apnea    no cpap/ sleeps sitting up   Past Surgical History:  Procedure Laterality Date   ABDOMINAL HYSTERECTOMY     partial, fibroids   BOTOX INJECTION N/A 02/09/2020   Procedure: BOTOX INJECTION;  Surgeon:  Hollice Espy, MD;  Location: [facility];  Service: Urology;  Laterality: N/A;   CYSTOSCOPY WITH STENT PLACEMENT Left 02/18/2019   Procedure: CYSTOSCOPY WITH STENT PLACEMENT;  Surgeon: Abbie Sons, MD;  Location: [facility];  Service: Urology;  Laterality: Left;   CYSTOSCOPY/URETEROSCOPY/HOLMIUM LASER/STENT PLACEMENT Left 04/01/2019   Procedure: CYSTOSCOPY/URETEROSCOPY/HOLMIUM LASER/STENT PLACEMENT;  Surgeon: Abbie Sons, MD;  Location: [facility];  Service: Urology;  Laterality: Left;   ESOPHAGOGASTRODUODENOSCOPY  2000   TONSILLECTOMY     UMBILICAL HERNIA REPAIR  01/2004   URETERAL STENT PLACEMENT  2/13   for L sided stone/ ecoli sepsis   Social History   Tobacco Use   Smoking status: Never   Smokeless tobacco: Never  Vaping Use   Vaping Use: Never used  Substance Use Topics   Alcohol use: No    Alcohol/week: 0.0 standard drinks   Drug use: No   Family History  Problem Relation Age of Onset   Early death Father 16       train accident   Diabetes Mother    Heart disease Mother    Diabetes Sister    Diabetes Brother    Colon cancer Brother    Cancer Paternal Grandmother        unknown   Diabetes Sister    Allergies  Allergen Reactions   Rofecoxib     REACTION: reaction not known   Antihistamines, Chlorpheniramine-Type Palpitations   Sulfa Antibiotics Rash   Tetanus-Diphtheria Toxoids Td Other (See Comments)    REACTION: sick with fever   Current Outpatient Medications on File Prior to Visit  Medication Sig Dispense Refill   Accu-Chek Softclix Lancets lancets USE AS DIRECTED TO CHECK BLOOD SUGAR ONCE DAILY AND AS NEEDED. 100 each 3   Ascorbic Acid (VITAMIN C) 500 MG tablet Take 500 mg by mouth daily.     Aspirin-Caffeine (BC FAST PAIN RELIEF PO) Take 1 packet by mouth daily as needed (headache).     benazepril (LOTENSIN) 20 MG tablet Take 1 tablet (20 mg total) by mouth daily. 90 tablet 3   CALCIUM-VITAMIN D PO Take 600 mg by mouth daily.     cholecalciferol  (VITAMIN D) 25 MCG (1000 UNIT) tablet Take 1,000 Units by mouth daily.     Cyanocobalamin (B-12 PO) Take 1,000 mcg by mouth daily.      furosemide (LASIX) 40 MG tablet Take 1 tablet (40 mg total) by mouth daily. 90 tablet 3   glipiZIDE (GLUCOTROL XL) 10 MG 24 hr tablet Take 1 tablet (10 mg total) by mouth daily with breakfast. 90 tablet 3   glucose blood (ACCU-CHEK AVIVA PLUS) test strip USE AS DIRECTED TO CHECK BLOOD SUGAR ONCE  DAILY AND AS NEEDED. 90 each 3   metFORMIN (GLUCOPHAGE) 1000 MG tablet Take 1 tablet (1,000 mg total) by mouth 2 (two) times daily with a meal. 180 tablet 3   Multiple Vitamin (MULTIVITAMIN) capsule Take 1 capsule by mouth daily.     naproxen sodium (ALEVE) 220 MG tablet Take 220 mg by mouth daily as needed (Pain).      Omega-3 Fatty Acids (FISH OIL PO) Take 1,000 mg by mouth daily.     omeprazole (PRILOSEC) 20 MG capsule Take 1 capsule (20 mg total) by mouth daily. 90 capsule 3   Pseudoephedrine HCl (SINUS & ALLERGY 12 HOUR PO) Take 1 tablet by mouth daily.      simvastatin (ZOCOR) 40 MG tablet Take 1 tablet (40 mg total) by mouth at bedtime. 90 tablet 3   triamcinolone cream (KENALOG) 0.1 % APPLY TO AFFECTED AREA(S)  TOPICALLY TWO TIMES A DAY (Patient taking differently: Apply 1 application topically daily as needed (Cut/infection).) 30 g 1   vitamin E 400 UNIT capsule Take 400 Units by mouth daily.     No current facility-administered medications on file prior to visit.    Review of Systems  Constitutional:  Positive for fatigue. Negative for activity change, appetite change, fever and unexpected weight change.  HENT:  Negative for congestion, ear pain, rhinorrhea, sinus pressure and sore throat.   Eyes:  Negative for pain, redness and visual disturbance.  Respiratory:  Negative for cough, shortness of breath and wheezing.   Cardiovascular:  Negative for chest pain and palpitations.  Gastrointestinal:  Negative for abdominal pain, blood in stool, constipation and  diarrhea.  Endocrine: Negative for polydipsia and polyuria.  Genitourinary:  Negative for dysuria, frequency and urgency.  Musculoskeletal:  Negative for arthralgias, back pain and myalgias.  Skin:  Negative for pallor and rash.  Allergic/Immunologic: Negative for environmental allergies.  Neurological:  Negative for dizziness, syncope and headaches.  Hematological:  Negative for adenopathy. Does not bruise/bleed easily.  Psychiatric/Behavioral:  Negative for decreased concentration and dysphoric mood. The patient is not nervous/anxious.       Objective:   Physical Exam Constitutional:      General: She is not in acute distress.    Appearance: Normal appearance. She is well-developed. She is obese. She is not ill-appearing or diaphoretic.  HENT:     Head: Normocephalic and atraumatic.  Eyes:     Conjunctiva/sclera: Conjunctivae normal.     Pupils: Pupils are equal, round, and reactive to light.  Neck:     Thyroid: No thyromegaly.     Vascular: No carotid bruit or JVD.  Cardiovascular:     Rate and Rhythm: Normal rate and regular rhythm.     Heart sounds: Normal heart sounds.    No gallop.  Pulmonary:     Effort: Pulmonary effort is normal. No respiratory distress.     Breath sounds: Normal breath sounds. No wheezing or rales.  Abdominal:     General: Bowel sounds are normal. There is no distension or abdominal bruit.     Palpations: Abdomen is soft. There is no mass.     Tenderness: There is no abdominal tenderness.  Musculoskeletal:     Cervical back: Normal range of motion and neck supple.     Right lower leg: No edema.     Left lower leg: No edema.     Comments: Nl foot exam with good self care No pedal edema   Lymphadenopathy:     Cervical: No cervical  adenopathy.  Skin:    General: Skin is warm and dry.     Coloration: Skin is not pale.     Findings: No rash.  Neurological:     Mental Status: She is alert.     Coordination: Coordination normal.     Deep Tendon  Reflexes: Reflexes are normal and symmetric. Reflexes normal.  Psychiatric:        Mood and Affect: Mood normal.          Assessment & Plan:   Problem List Items Addressed This Visit       Cardiovascular and Mediastinum   Essential hypertension    bp in fair control at this time  BP Readings from Last 1 Encounters:  10/12/20 134/72  No changes needed Most recent labs reviewed  Disc lifstyle change with low sodium diet and exercise  Plan to continue Benazepril 20 mg daily  Lasix 40 mg daily        Endocrine   Diabetes type 2, controlled ([place]) - Primary    Lab Results  Component Value Date   HGBA1C 6.7 (A) 10/12/2020  Much improved with better diet  Less processed carbs Stopped pm snack  Plan to continue metformin 1000 mg bid and glipizide xl 10 mg daily  Will try to add more exercise  On ace and statin  Eye exam utd, rev report no retinopathy Good foot care, rev exam and self care F/u 3 mo      Relevant Orders   POCT glycosylated hemoglobin (Hb A1C) (Completed)   Hyperlipidemia associated with type 2 diabetes mellitus ([place])    Disc goals for lipids and reasons to control them Rev last labs with pt Rev low sat fat diet in detail  Plan to continue simvastatin 40mg  daily and tolerates well         Other   Morbid obesity ([place])    Discussed how this problem influences overall health and the risks it imposes  Reviewed plan for weight loss with lower calorie diet (via better food choices and also portion control or program like weight watchers) and exercise building up to or more than 30 minutes 5 days per week including some aerobic activity

## 2020-10-12 NOTE — Assessment & Plan Note (Signed)
Discussed how this problem influences overall health and the risks it imposes  Reviewed plan for weight loss with lower calorie diet (via better food choices and also portion control or program like weight watchers) and exercise building up to or more than 30 minutes 5 days per week including some aerobic activity    

## 2020-10-23 ENCOUNTER — Inpatient Hospital Stay
Admission: EM | Admit: 2020-10-23 | Discharge: 2020-10-25 | DRG: 872 | Disposition: A | Discharge status: Home / Self Care | Payer: Medicare Other | Attending: Internal Medicine | Admitting: Internal Medicine

## 2020-10-23 ENCOUNTER — Emergency Department: Payer: Medicare Other

## 2020-10-23 ENCOUNTER — Other Ambulatory Visit: Payer: Self-pay

## 2020-10-23 DIAGNOSIS — Z8249 Family history of ischemic heart disease and other diseases of the circulatory system: Secondary | ICD-10-CM

## 2020-10-23 DIAGNOSIS — E1122 Type 2 diabetes mellitus with diabetic chronic kidney disease: Secondary | ICD-10-CM | POA: Diagnosis present

## 2020-10-23 DIAGNOSIS — Z79899 Other long term (current) drug therapy: Secondary | ICD-10-CM | POA: Diagnosis not present

## 2020-10-23 DIAGNOSIS — N182 Chronic kidney disease, stage 2 (mild): Secondary | ICD-10-CM | POA: Diagnosis not present

## 2020-10-23 DIAGNOSIS — N136 Pyonephrosis: Secondary | ICD-10-CM | POA: Diagnosis not present

## 2020-10-23 DIAGNOSIS — R5381 Other malaise: Secondary | ICD-10-CM | POA: Diagnosis not present

## 2020-10-23 DIAGNOSIS — I700 Atherosclerosis of aorta: Secondary | ICD-10-CM | POA: Diagnosis present

## 2020-10-23 DIAGNOSIS — B9620 Unspecified Escherichia coli [E. coli] as the cause of diseases classified elsewhere: Secondary | ICD-10-CM

## 2020-10-23 DIAGNOSIS — N1330 Unspecified hydronephrosis: Secondary | ICD-10-CM | POA: Diagnosis not present

## 2020-10-23 DIAGNOSIS — K219 Gastro-esophageal reflux disease without esophagitis: Secondary | ICD-10-CM | POA: Diagnosis present

## 2020-10-23 DIAGNOSIS — J45909 Unspecified asthma, uncomplicated: Secondary | ICD-10-CM | POA: Diagnosis present

## 2020-10-23 DIAGNOSIS — Z888 Allergy status to other drugs, medicaments and biological substances status: Secondary | ICD-10-CM | POA: Diagnosis not present

## 2020-10-23 DIAGNOSIS — Z90710 Acquired absence of both cervix and uterus: Secondary | ICD-10-CM

## 2020-10-23 DIAGNOSIS — K8020 Calculus of gallbladder without cholecystitis without obstruction: Secondary | ICD-10-CM | POA: Diagnosis present

## 2020-10-23 DIAGNOSIS — Z20822 Contact with and (suspected) exposure to covid-19: Secondary | ICD-10-CM | POA: Diagnosis present

## 2020-10-23 DIAGNOSIS — E785 Hyperlipidemia, unspecified: Secondary | ICD-10-CM | POA: Diagnosis present

## 2020-10-23 DIAGNOSIS — I5032 Chronic diastolic (congestive) heart failure: Secondary | ICD-10-CM | POA: Diagnosis not present

## 2020-10-23 DIAGNOSIS — I248 Other forms of acute ischemic heart disease: Secondary | ICD-10-CM | POA: Diagnosis present

## 2020-10-23 DIAGNOSIS — Z66 Do not resuscitate: Secondary | ICD-10-CM | POA: Diagnosis not present

## 2020-10-23 DIAGNOSIS — Z882 Allergy status to sulfonamides status: Secondary | ICD-10-CM | POA: Diagnosis not present

## 2020-10-23 DIAGNOSIS — I130 Hypertensive heart and chronic kidney disease with heart failure and stage 1 through stage 4 chronic kidney disease, or unspecified chronic kidney disease: Secondary | ICD-10-CM | POA: Diagnosis present

## 2020-10-23 DIAGNOSIS — N200 Calculus of kidney: Secondary | ICD-10-CM | POA: Diagnosis not present

## 2020-10-23 DIAGNOSIS — I10 Essential (primary) hypertension: Secondary | ICD-10-CM | POA: Diagnosis not present

## 2020-10-23 DIAGNOSIS — R000 Tachycardia, unspecified: Secondary | ICD-10-CM | POA: Diagnosis not present

## 2020-10-23 DIAGNOSIS — R0602 Shortness of breath: Secondary | ICD-10-CM | POA: Diagnosis not present

## 2020-10-23 DIAGNOSIS — K760 Fatty (change of) liver, not elsewhere classified: Secondary | ICD-10-CM | POA: Diagnosis not present

## 2020-10-23 DIAGNOSIS — E1169 Type 2 diabetes mellitus with other specified complication: Secondary | ICD-10-CM | POA: Diagnosis present

## 2020-10-23 DIAGNOSIS — R778 Other specified abnormalities of plasma proteins: Secondary | ICD-10-CM

## 2020-10-23 DIAGNOSIS — K5730 Diverticulosis of large intestine without perforation or abscess without bleeding: Secondary | ICD-10-CM | POA: Diagnosis not present

## 2020-10-23 DIAGNOSIS — A419 Sepsis, unspecified organism: Secondary | ICD-10-CM | POA: Diagnosis not present

## 2020-10-23 DIAGNOSIS — R531 Weakness: Secondary | ICD-10-CM

## 2020-10-23 DIAGNOSIS — R059 Cough, unspecified: Secondary | ICD-10-CM | POA: Diagnosis not present

## 2020-10-23 DIAGNOSIS — N390 Urinary tract infection, site not specified: Secondary | ICD-10-CM | POA: Diagnosis not present

## 2020-10-23 DIAGNOSIS — Z87442 Personal history of urinary calculi: Secondary | ICD-10-CM | POA: Diagnosis not present

## 2020-10-23 DIAGNOSIS — Z7984 Long term (current) use of oral hypoglycemic drugs: Secondary | ICD-10-CM

## 2020-10-23 DIAGNOSIS — Z86010 Personal history of colonic polyps: Secondary | ICD-10-CM

## 2020-10-23 DIAGNOSIS — Z833 Family history of diabetes mellitus: Secondary | ICD-10-CM

## 2020-10-23 DIAGNOSIS — A4151 Sepsis due to Escherichia coli [E. coli]: Principal | ICD-10-CM | POA: Diagnosis present

## 2020-10-23 DIAGNOSIS — G4733 Obstructive sleep apnea (adult) (pediatric): Secondary | ICD-10-CM | POA: Diagnosis present

## 2020-10-23 DIAGNOSIS — E1129 Type 2 diabetes mellitus with other diabetic kidney complication: Secondary | ICD-10-CM | POA: Diagnosis present

## 2020-10-23 DIAGNOSIS — J4520 Mild intermittent asthma, uncomplicated: Secondary | ICD-10-CM

## 2020-10-23 DIAGNOSIS — Z743 Need for continuous supervision: Secondary | ICD-10-CM | POA: Diagnosis not present

## 2020-10-23 DIAGNOSIS — Z887 Allergy status to serum and vaccine status: Secondary | ICD-10-CM

## 2020-10-23 DIAGNOSIS — R7989 Other specified abnormal findings of blood chemistry: Secondary | ICD-10-CM | POA: Diagnosis present

## 2020-10-23 LAB — CBC WITH DIFFERENTIAL/PLATELET
Abs Immature Granulocytes: 0.01 10*3/uL (ref 0.00–0.07)
Basophils Absolute: 0 10*3/uL (ref 0.0–0.1)
Basophils Relative: 0 %
Eosinophils Absolute: 0 10*3/uL (ref 0.0–0.5)
Eosinophils Relative: 0 %
HCT: 39.9 % (ref 36.0–46.0)
Hemoglobin: 13.6 g/dL (ref 12.0–15.0)
Immature Granulocytes: 0 %
Lymphocytes Relative: 11 %
Lymphs Abs: 0.7 10*3/uL (ref 0.7–4.0)
MCH: 31.9 pg (ref 26.0–34.0)
MCHC: 34.1 g/dL (ref 30.0–36.0)
MCV: 93.4 fL (ref 80.0–100.0)
Monocytes Absolute: 0.3 10*3/uL (ref 0.1–1.0)
Monocytes Relative: 5 %
Neutro Abs: 5.1 10*3/uL (ref 1.7–7.7)
Neutrophils Relative %: 84 %
Platelets: 154 10*3/uL (ref 150–400)
RBC: 4.27 MIL/uL (ref 3.87–5.11)
RDW: 12.7 % (ref 11.5–15.5)
WBC: 6 10*3/uL (ref 4.0–10.5)
nRBC: 0 % (ref 0.0–0.2)

## 2020-10-23 LAB — TROPONIN I (HIGH SENSITIVITY)
Troponin I (High Sensitivity): 24 ng/L — ABNORMAL HIGH (ref <18)
Troponin I (High Sensitivity): 77 ng/L — ABNORMAL HIGH (ref <18)
Troponin I (High Sensitivity): 79 ng/L — ABNORMAL HIGH (ref <18)

## 2020-10-23 LAB — PROCALCITONIN: Procalcitonin: 0.8 ng/mL

## 2020-10-23 LAB — RESP PANEL BY RT-PCR (FLU A&B, COVID) ARPGX2
Influenza A by PCR: NEGATIVE
Influenza B by PCR: NEGATIVE
SARS Coronavirus 2 by RT PCR: NEGATIVE

## 2020-10-23 LAB — LACTIC ACID, PLASMA
Lactic Acid, Venous: 1.1 mmol/L (ref 0.5–1.9)
Lactic Acid, Venous: 1 mmol/L (ref 0.5–1.9)

## 2020-10-23 LAB — COMPREHENSIVE METABOLIC PANEL
ALT: 15 U/L (ref 0–44)
AST: 19 U/L (ref 15–41)
Albumin: 3.5 g/dL (ref 3.5–5.0)
Alkaline Phosphatase: 62 U/L (ref 38–126)
Anion gap: 14 (ref 5–15)
BUN: 22 mg/dL (ref 8–23)
CO2: 23 mmol/L (ref 22–32)
Calcium: 9.2 mg/dL (ref 8.9–10.3)
Chloride: 100 mmol/L (ref 98–111)
Creatinine, Ser: 1.12 mg/dL — ABNORMAL HIGH (ref 0.44–1.00)
GFR, Estimated: 49 mL/min — ABNORMAL LOW (ref >60)
Glucose, Bld: 206 mg/dL — ABNORMAL HIGH (ref 70–99)
Potassium: 3.6 mmol/L (ref 3.5–5.1)
Sodium: 137 mmol/L (ref 135–145)
Total Bilirubin: 2.5 mg/dL — ABNORMAL HIGH (ref 0.3–1.2)
Total Protein: 7.4 g/dL (ref 6.5–8.1)

## 2020-10-23 LAB — MAGNESIUM: Magnesium: 1.9 mg/dL (ref 1.7–2.4)

## 2020-10-23 LAB — CK: Total CK: 85 U/L (ref 38–234)

## 2020-10-23 LAB — BRAIN NATRIURETIC PEPTIDE: B Natriuretic Peptide: 80.3 pg/mL (ref 0.0–100.0)

## 2020-10-23 LAB — T4, FREE: Free T4: 1.03 ng/dL (ref 0.61–1.12)

## 2020-10-23 LAB — TSH: TSH: 0.47 u[IU]/mL (ref 0.350–4.500)

## 2020-10-23 LAB — CBG MONITORING, ED: Glucose-Capillary: 197 mg/dL — ABNORMAL HIGH (ref 70–99)

## 2020-10-23 LAB — GLUCOSE, CAPILLARY: Glucose-Capillary: 166 mg/dL — ABNORMAL HIGH (ref 70–99)

## 2020-10-23 MED ORDER — ACETAMINOPHEN 325 MG PO TABS: 650.0000 mg | ORAL_TABLET | Freq: Four times a day (QID) | ORAL | Status: DC | PRN

## 2020-10-23 MED ORDER — SODIUM CHLORIDE 0.9 % IV SOLN
2.0000 g | Freq: Two times a day (BID) | INTRAVENOUS | Status: DC
  Administered 2020-10-23: 2 g via INTRAVENOUS
  Filled 2020-10-23 (×3): qty 2

## 2020-10-23 MED ORDER — SODIUM CHLORIDE 0.9 % IV BOLUS
500.0000 mL | Freq: Once | INTRAVENOUS | Status: AC
  Administered 2020-10-23: 500 mL via INTRAVENOUS

## 2020-10-23 MED ORDER — ONDANSETRON HCL 4 MG/2ML IJ SOLN: 4.0000 mg | Freq: Three times a day (TID) | INTRAMUSCULAR | Status: DC | PRN

## 2020-10-23 MED ORDER — INSULIN ASPART 100 UNIT/ML IJ SOLN
0.0000 [IU] | Freq: Three times a day (TID) | INTRAMUSCULAR | Status: DC
  Administered 2020-10-23 – 2020-10-24 (×2): 2 [IU] via SUBCUTANEOUS
  Administered 2020-10-24: 3 [IU] via SUBCUTANEOUS
  Administered 2020-10-24 – 2020-10-25 (×2): 2 [IU] via SUBCUTANEOUS
  Filled 2020-10-23 (×5): qty 1

## 2020-10-23 MED ORDER — VANCOMYCIN HCL IN DEXTROSE 1-5 GM/200ML-% IV SOLN: 1000.0000 mg | Freq: Once | INTRAVENOUS | Status: DC

## 2020-10-23 MED ORDER — SODIUM CHLORIDE 0.9 % IV SOLN: INTRAVENOUS | Status: DC

## 2020-10-23 MED ORDER — VITAMIN B-12 1000 MCG PO TABS
1000.0000 ug | ORAL_TABLET | Freq: Every day | ORAL | Status: DC
  Administered 2020-10-24 – 2020-10-25 (×2): 1000 ug via ORAL
  Filled 2020-10-23 (×2): qty 1

## 2020-10-23 MED ORDER — ASPIRIN-ACETAMINOPHEN-CAFFEINE 250-250-65 MG PO TABS
1.0000 | ORAL_TABLET | Freq: Every day | ORAL | Status: DC | PRN
  Filled 2020-10-23: qty 1

## 2020-10-23 MED ORDER — ONDANSETRON HCL 4 MG/2ML IJ SOLN
4.0000 mg | Freq: Once | INTRAMUSCULAR | Status: AC
  Administered 2020-10-23: 4 mg via INTRAVENOUS
  Filled 2020-10-23: qty 2

## 2020-10-23 MED ORDER — ACETAMINOPHEN 500 MG PO TABS
1000.0000 mg | ORAL_TABLET | Freq: Once | ORAL | Status: AC
  Administered 2020-10-23: 1000 mg via ORAL
  Filled 2020-10-23: qty 2

## 2020-10-23 MED ORDER — INSULIN ASPART 100 UNIT/ML IJ SOLN: 0.0000 [IU] | Freq: Every day | INTRAMUSCULAR | Status: DC

## 2020-10-23 MED ORDER — ADULT MULTIVITAMIN W/MINERALS CH
1.0000 | ORAL_TABLET | Freq: Every day | ORAL | Status: DC
  Administered 2020-10-24 – 2020-10-25 (×2): 1 via ORAL
  Filled 2020-10-23 (×2): qty 1

## 2020-10-23 MED ORDER — HYDRALAZINE HCL 20 MG/ML IJ SOLN: 5.0000 mg | INTRAMUSCULAR | Status: DC | PRN

## 2020-10-23 MED ORDER — METRONIDAZOLE 500 MG/100ML IV SOLN
500.0000 mg | Freq: Once | INTRAVENOUS | Status: AC
  Administered 2020-10-23: 500 mg via INTRAVENOUS
  Filled 2020-10-23: qty 100

## 2020-10-23 MED ORDER — DM-GUAIFENESIN ER 30-600 MG PO TB12
1.0000 | ORAL_TABLET | Freq: Two times a day (BID) | ORAL | Status: DC | PRN
  Administered 2020-10-24 – 2020-10-25 (×2): 1 via ORAL
  Filled 2020-10-23 (×2): qty 1

## 2020-10-23 MED ORDER — SIMVASTATIN 20 MG PO TABS
40.0000 mg | ORAL_TABLET | Freq: Every day | ORAL | Status: DC
  Administered 2020-10-23 – 2020-10-24 (×2): 40 mg via ORAL
  Filled 2020-10-23 (×3): qty 2

## 2020-10-23 MED ORDER — VANCOMYCIN HCL 1250 MG/250ML IV SOLN: 1250.0000 mg | INTRAVENOUS | Status: DC

## 2020-10-23 MED ORDER — SODIUM CHLORIDE 0.9 % IV BOLUS
1000.0000 mL | Freq: Once | INTRAVENOUS | Status: AC
  Administered 2020-10-23: 1000 mL via INTRAVENOUS

## 2020-10-23 MED ORDER — SODIUM CHLORIDE 0.9 % IV SOLN: 2.0000 g | Freq: Once | INTRAVENOUS | Status: DC

## 2020-10-23 MED ORDER — ALBUTEROL SULFATE (2.5 MG/3ML) 0.083% IN NEBU: 2.5000 mg | INHALATION_SOLUTION | RESPIRATORY_TRACT | Status: DC | PRN

## 2020-10-23 MED ORDER — PANTOPRAZOLE SODIUM 40 MG PO TBEC
40.0000 mg | DELAYED_RELEASE_TABLET | Freq: Every day | ORAL | Status: DC
  Administered 2020-10-23 – 2020-10-25 (×3): 40 mg via ORAL
  Filled 2020-10-23 (×3): qty 1

## 2020-10-23 MED ORDER — VANCOMYCIN HCL 2000 MG/400ML IV SOLN
2000.0000 mg | Freq: Once | INTRAVENOUS | Status: AC
Start: 2020-10-23 — End: 2020-10-24
  Administered 2020-10-23: 2000 mg via INTRAVENOUS
  Filled 2020-10-23 (×2): qty 400

## 2020-10-23 MED ORDER — ENOXAPARIN SODIUM 60 MG/0.6ML IJ SOSY
0.5000 mg/kg | PREFILLED_SYRINGE | INTRAMUSCULAR | Status: DC
  Administered 2020-10-23 – 2020-10-24 (×2): 45 mg via SUBCUTANEOUS
  Filled 2020-10-23: qty 0.6
  Filled 2020-10-23 (×2): qty 0.45

## 2020-10-23 NOTE — ED Triage Notes (Signed)
Pt in via [facility] from home with c/o weakness. Pt reports persistent cough and congestion as well but rapid covid test was negative. Pt also with decrease appetite.

## 2020-10-23 NOTE — Sepsis Progress Note (Signed)
Sepsis protocol is being monitored by [facility]. 

## 2020-10-23 NOTE — Progress Notes (Signed)
Pharmacy Antibiotic Note  Gabriella Howe is a 86 y.o. female admitted on 10/23/2020 with sepsis.  Pharmacy has been consulted for vancomycin and cefepime dosing. Presented to ED with SOB, cough, weakness, and decreased appetite. Patient found to be septic with elevated HR 120, RR 23, and Tmax 103F. Blood cultures pending.   Plan: Vancomycin 2 g ordered x 1 to be given in the ED. Will order maintenance dose of vancomycin 1250 mg q48 hours, which will have a predicted AUC of 508. Goal AUC 400-550. Scr of 1.12 used and Vd of 0.5. Obtain levels prior to the 3rd-4th dose.   Cefepime 2 g ordered x 1 to be given in the ED. Will order cefepime 2 g q12 hours.   Monitor WBC, Tmax, vitals and cultures.   Height: 5' (152.4 cm) Weight: 90.7 kg (200 lb) IBW/kg (Calculated) : 45.5  Temp (24hrs), Avg:100.8 F (38.2 C), Min:98.5 F (36.9 C), Max:103 F (39.4 C)  Recent Labs  Lab 10/23/20 1400 10/23/20 1528  WBC 6.0  --   CREATININE 1.12*  --   LATICACIDVEN  --  1.1    Estimated Creatinine Clearance: 38.2 mL/min (A) (by C-G formula based on SCr of 1.12 mg/dL (H)).    Allergies  Allergen Reactions   Rofecoxib     REACTION: reaction not known   Antihistamines, Chlorpheniramine-Type Palpitations   Sulfa Antibiotics Rash   Tetanus-Diphtheria Toxoids Td Other (See Comments)    REACTION: sick with fever    Antimicrobials this admission: 7/30 vancomycin >>  7/30 cefepime >>   Dose adjustments this admission: None  Microbiology results: 7/30 BCx: pending    Thank you for allowing pharmacy to be a part of this patient's care.   Wynelle Cleveland, PharmD Pharmacy Resident  10/23/2020 4:55 PM

## 2020-10-23 NOTE — ED Provider Notes (Signed)
[facility] Emergency Department Provider Note  ____________________________________________   Event Date/Time   First MD Initiated Contact with Patient 10/23/20 1126     (approximate)  I have reviewed the triage vital signs and the nursing notes.   HISTORY  Chief Complaint Weakness    HPI Gabriella Howe is a 86 y.o. female with diabetes, hypertension, hyperlipidemia who comes in with weakness.  Patient reports not feeling well for the past 4 days.  Reports taking a COVID test yesterday that was negative.  States that she has been having a cough, congestion and just not feeling hungry.  States that she really has not in the last couple days due to this.  Her symptoms are constant, nothing makes it better or worse.  Denies any recent falls.  Denies any known COVID contacts.          Past Medical History:  Diagnosis Date   Adenomatous colon polyp 2003   Benign neoplasm of colon    Carpal tunnel syndrome    much better   Chest pain, unspecified    CHF (congestive heart failure) (HCC)    diastolic   Complication of anesthesia    careful in positioning due to back pain   Degeneration of intervertebral disc, site unspecified    Diabetes mellitus    type II   Encounter for long-term (current) use of other medications    Fluid overload    GERD (gastroesophageal reflux disease)    Heart disease, unspecified    History of kidney stones    with stent/hospitalized   Hyperlipidemia    Hypertension    Obesity, unspecified    Rosacea    Shortness of breath    Unspecified asthma(493.90)    Unspecified sleep apnea    no cpap/ sleeps sitting up    Patient Active Problem List   Diagnosis Date Noted   Pedal edema 08/05/2019   DJD (degenerative joint disease) 02/26/2019   Iron deficiency anemia 04/30/2017   History of shingles 10/03/2016   Routine general medical examination at a health care facility 04/14/2015   Back pain 07/28/2014   Knee pain  07/28/2014   Incomplete emptying of bladder 11/28/2012   Frequency of urination 11/28/2012   Encounter for Medicare annual wellness exam 11/19/2012   History of kidney stones 0000000   DIASTOLIC HEART FAILURE, CHRONIC 07/29/2009   Hyperlipidemia associated with type 2 diabetes mellitus ([place]) 09/13/2006   Morbid obesity ([place]) 09/10/2006   CARPAL TUNNEL SYNDROME 09/10/2006   Essential hypertension [id number]   DIASTOLIC DYSFUNCTION [id number]   REACTIVE AIRWAY DISEASE 09/10/2006   GERD 09/10/2006   ROSACEA 09/10/2006   Degeneration of lumbar or lumbosacral intervertebral disc 09/10/2006   OSA (obstructive sleep apnea) 09/10/2006   COLONIC POLYPS, HX OF 09/10/2006   Diabetes type 2, controlled ([place]) 09/06/2006    Past Surgical History:  Procedure Laterality Date   ABDOMINAL HYSTERECTOMY     partial, fibroids   BOTOX INJECTION N/A 02/09/2020   Procedure: BOTOX INJECTION;  Surgeon: Hollice Espy, MD;  Location: [facility] OR[facility];  Service: Urology;  Laterality: N/A;   CYSTOSCOPY WITH STENT PLACEMENT Left 02/18/2019   Procedure: CYSTOSCOPY WITH STENT PLACEMENT;  Surgeon: Abbie Sons, MD;  Location: [facility] OR[facility];  Service: Urology;  Laterality: Left;   CYSTOSCOPY/URETEROSCOPY/HOLMIUM LASER/STENT PLACEMENT Left 04/01/2019   Procedure: CYSTOSCOPY/URETEROSCOPY/HOLMIUM LASER/STENT PLACEMENT;  Surgeon: Abbie Sons, MD;  Location: [facility];  Service: Urology;  Laterality: Left;   ESOPHAGOGASTRODUODENOSCOPY  2000   TONSILLECTOMY  UMBILICAL HERNIA REPAIR  01/2004   URETERAL STENT PLACEMENT  2/13   for L sided stone/ ecoli sepsis    Prior to Admission medications   Medication Sig Start Date End Date Taking? Authorizing Provider  Accu-Chek Softclix Lancets lancets USE AS DIRECTED TO CHECK BLOOD SUGAR ONCE DAILY AND AS NEEDED. 09/23/20   Tower, Wynelle Fanny, MD  Ascorbic Acid (VITAMIN C) 500 MG tablet Take 500 mg by mouth daily.    [provider]  Aspirin-Caffeine (BC FAST PAIN RELIEF  PO) Take 1 packet by mouth daily as needed (headache).    [provider]  benazepril (LOTENSIN) 20 MG tablet Take 1 tablet (20 mg total) by mouth daily. 07/08/20   Tower, Wynelle Fanny, MD  CALCIUM-VITAMIN D PO Take 600 mg by mouth daily.    [provider]  cholecalciferol (VITAMIN D) 25 MCG (1000 UNIT) tablet Take 1,000 Units by mouth daily.    [provider]  Cyanocobalamin (B-12 PO) Take 1,000 mcg by mouth daily.     [provider]  furosemide (LASIX) 40 MG tablet Take 1 tablet (40 mg total) by mouth daily. 07/08/20   Tower, Wynelle Fanny, MD  glipiZIDE (GLUCOTROL XL) 10 MG 24 hr tablet Take 1 tablet (10 mg total) by mouth daily with breakfast. 07/08/20   Tower, Wynelle Fanny, MD  glucose blood (ACCU-CHEK AVIVA PLUS) test strip USE AS DIRECTED TO CHECK BLOOD SUGAR ONCE DAILY AND AS NEEDED. 09/23/20   Tower, Wynelle Fanny, MD  metFORMIN (GLUCOPHAGE) 1000 MG tablet Take 1 tablet (1,000 mg total) by mouth 2 (two) times daily with a meal. 07/08/20   Tower, Wynelle Fanny, MD  Multiple Vitamin (MULTIVITAMIN) capsule Take 1 capsule by mouth daily.    [provider]  naproxen sodium (ALEVE) 220 MG tablet Take 220 mg by mouth daily as needed (Pain).     [provider]  Omega-3 Fatty Acids (FISH OIL PO) Take 1,000 mg by mouth daily.    [provider]  omeprazole (PRILOSEC) 20 MG capsule Take 1 capsule (20 mg total) by mouth daily. 07/08/20   Tower, Wynelle Fanny, MD  Pseudoephedrine HCl (SINUS & ALLERGY 12 HOUR PO) Take 1 tablet by mouth daily.     [provider]  simvastatin (ZOCOR) 40 MG tablet Take 1 tablet (40 mg total) by mouth at bedtime. 07/08/20   Tower, Wynelle Fanny, MD  triamcinolone cream (KENALOG) 0.1 % APPLY TO AFFECTED AREA(S)  TOPICALLY TWO TIMES A DAY Patient taking differently: Apply 1 application topically daily as needed (Cut/infection). 02/17/19   Tower, Wynelle Fanny, MD  vitamin E 400 UNIT capsule Take 400 Units by mouth daily.    [provider]     Allergies Rofecoxib; Antihistamines, chlorpheniramine-type; Sulfa antibiotics; and Tetanus-diphtheria toxoids td  Family History  Problem Relation Age of Onset   Early death Father 55       train accident   Diabetes Mother    Heart disease Mother    Diabetes Sister    Diabetes Brother    Colon cancer Brother    Cancer Paternal Grandmother        unknown   Diabetes Sister     Social History Social History   Tobacco Use   Smoking status: Never   Smokeless tobacco: Never  Vaping Use   Vaping Use: Never used  Substance Use Topics   Alcohol use: No    Alcohol/week: 0.0 standard drinks   Drug use: No  Review of Systems Constitutional: no fever, weakness Eyes: No visual changes. ENT: No sore throat. Cardiovascular: No chest pain Respiratory: Cough, congestion, mild shortness of breath Gastrointestinal: No abdominal pain.  Nausea, decreased appetite Genitourinary: Negative for dysuria. Musculoskeletal: Negative for back pain. Skin: Negative for rash. Neurological: Negative for headaches, focal weakness or numbness. All other ROS negative ____________________________________________   PHYSICAL EXAM:  VITAL SIGNS: Blood pressure (!) 146/73, pulse 92, temperature 98.5 F (36.9 C), temperature source Oral, resp. rate 18, height 5' (1.524 m), weight 90.7 kg, SpO2 95 %.   Constitutional: Alert and oriented. Well appearing and in no acute distress. Eyes: Conjunctivae are normal. EOMI. Head: Atraumatic. Nose: No congestion/rhinnorhea. Mouth/Throat: Mucous membranes are moist.   Neck: No stridor. Trachea Midline. FROM Cardiovascular: Normal rate, regular rhythm. Grossly normal heart sounds.  Good peripheral circulation. Respiratory: Clear lungs, no increased work of breathing Gastrointestinal: Soft and nontender. No distention. No abdominal bruits.  Musculoskeletal: No lower extremity tenderness nor edema.  No joint effusions. Neurologic:  Normal speech and  language. No gross focal neurologic deficits are appreciated.  Equal strength in arms and legs. Skin:  Skin is warm, dry and intact. No rash noted. Psychiatric: Mood and affect are normal. Speech and behavior are normal. GU: Deferred   ____________________________________________   LABS (all labs ordered are listed, but only abnormal results are displayed)  Labs Reviewed  COMPREHENSIVE METABOLIC PANEL - Abnormal; Notable for the following components:      Result Value   Glucose, Bld 206 (*)    Creatinine, Ser 1.12 (*)    Total Bilirubin 2.5 (*)    GFR, Estimated 49 (*)    All other components within normal limits  TROPONIN I (HIGH SENSITIVITY) - Abnormal; Notable for the following components:   Troponin I (High Sensitivity) 24 (*)    All other components within normal limits  RESP PANEL BY RT-PCR (FLU A&B, COVID) ARPGX2  CULTURE, BLOOD (ROUTINE X 2)  CULTURE, BLOOD (ROUTINE X 2)  CBC WITH DIFFERENTIAL/PLATELET  MAGNESIUM  TSH  T4, FREE  BRAIN NATRIURETIC PEPTIDE  CK  URINALYSIS, COMPLETE (UACMP) WITH MICROSCOPIC  LACTIC ACID, PLASMA  LACTIC ACID, PLASMA  PROCALCITONIN  CBG MONITORING, ED   ____________________________________________   ED ECG REPORT I, Vanessa [place], the attending physician, personally viewed and interpreted this ECG.  Normal sinus rate of 91, no ST elevation, no T wave inversions, normal intervals  Sinus tachycardia rate of 129, no ST elevation, no T wave inversions, normal intervals ____________________________________________  RADIOLOGY Robert Bellow, personally viewed and evaluated these images (plain radiographs) as part of my medical decision making, as well as reviewing the written report by the radiologist.  ED MD interpretation: Clear lung  Official radiology report(s): DG Chest 2 View  Result Date: 10/23/2020 CLINICAL DATA:  Cough and congestion.  Shortness of breath. EXAM: CHEST - 2 VIEW COMPARISON:  February 05, 2016 FINDINGS: The  heart size and mediastinal contours are within normal limits. Both lungs are clear. The visualized skeletal structures are unremarkable. IMPRESSION: No active cardiopulmonary disease. Electronically Signed   By: Dorise Bullion III M.D   On: 10/23/2020 12:28    ____________________________________________   PROCEDURES  Procedure(s) performed (including Critical Care):  .1-3 Lead EKG Interpretation  Date/Time: 10/23/2020 12:16 PM Performed by: Vanessa [place], MD Authorized by: Vanessa , MD     Interpretation: normal     ECG rate:  90s   ECG rate assessment: normal  Rhythm: sinus rhythm     Ectopy: none     Conduction: normal   .Critical Care  Date/Time: 10/23/2020 3:08 PM Performed by: Vanessa [place], MD Authorized by: Vanessa [place], MD   Critical care provider statement:    Critical care time (minutes):  35   Critical care was necessary to treat or prevent imminent or life-threatening deterioration of the following conditions:  Sepsis   Critical care was time spent personally by me on the following activities:  Discussions with consultants, evaluation of patient's response to treatment, examination of patient, ordering and performing treatments and interventions, ordering and review of laboratory studies, ordering and review of radiographic studies, pulse oximetry, re-evaluation of patient's condition, obtaining history from patient or surrogate and review of old charts   ____________________________________________   INITIAL IMPRESSION / ASSESSMENT AND PLAN / ED COURSE   Gabriella Howe was evaluated in Emergency Department on 10/23/2020 for the symptoms described in the history of present illness. She was evaluated in the context of the global COVID-19 pandemic, which necessitated consideration that the patient might be at risk for infection with the SARS-CoV-2 virus that causes COVID-19. Institutional protocols and algorithms that pertain to the evaluation of patients at  risk for COVID-19 are in a state of rapid change based on information released by regulatory bodies including the CDC and federal and state organizations. These policies and algorithms were followed during the patient's care in the ED.     Pt presents with SOB, cough weakness, decreased appetite.  Will get labs to evaluate for electrolyte abnormalities, AKI, chest x-ray to evaluate for pneumonia, COVID swab, urine to evaluate for UTI, EKG and cardiac markers to evaluate for ACS.  We will keep patient the cardiac monitor to evaluate for arrhythmia.  Given patient feels dehydrated with some nausea we will give 500 cc of fluid and some Zofran.  Her abdomen is soft and nontender at this time so lower suspicion for acute abdominal pathology.  1:58 PM we called lab given the labs were dosing collected and they stated that they could not read the labels therefore for over 2 hours they did not call us to let us know that there was an issue.  He also reported the COVID swab was not read.  Patient on reevaluation was Rigoring and noted to be tachycardic and temperature was taken was 103.  Patient has a history of getting septic from a kidney stone.  Given the delay with labs and COVID swab I do not want to wait for CT imaging given patient will need to go to the OR emergently we will proceed with CT renal to rule out kidney stone   Labs are reassuring but given patient meets sepsis criteria at her age will discuss to the hospital team for admission given COVID test was negative.  Patient started broad-spectrum antibiotics.          ____________________________________________   FINAL CLINICAL IMPRESSION(S) / ED DIAGNOSES   Final diagnoses:  Weakness  Sepsis, due to unspecified organism, unspecified whether acute organ dysfunction present ([place])     MEDICATIONS GIVEN DURING THIS VISIT:  Medications  ceFEPIme (MAXIPIME) 2 g in sodium chloride 0.9 % 100 mL IVPB (has no administration in time range)   metroNIDAZOLE (FLAGYL) IVPB 500 mg (has no administration in time range)  vancomycin (VANCOCIN) IVPB 1000 mg/200 mL premix (has no administration in time range)  sodium chloride 0.9 % bolus 500 mL (500 mLs Intravenous New Bag/Given 10/23/20 1247)  ondansetron [facility]) injection 4 mg (4 mg Intravenous Given 10/23/20 1246)  acetaminophen (TYLENOL) tablet 1,000 mg (1,000 mg Oral Given 10/23/20 1403)  sodium chloride 0.9 % bolus 1,000 mL (1,000 mLs Intravenous New Bag/Given 10/23/20 1403)     ED Discharge Orders     None        Note:  This document was prepared using Dragon voice recognition software and may include unintentional dictation errors.   Vanessa [place], MD 10/23/20 [phone]

## 2020-10-23 NOTE — Consult Note (Signed)
CODE SEPSIS - PHARMACY COMMUNICATION  **Broad Spectrum Antibiotics should be administered within 1 hour of Sepsis diagnosis**  Time Code Sepsis Called/Page Received: 1507  Antibiotics Ordered: 1507  Time of 1st antibiotic administration: 1728  Additional action taken by pharmacy: Messaged RN, difficulty obtaining IV access   If necessary, Name of Provider/Nurse Contacted: Skeet Simmer, RN    Darnelle Bos ,PharmD Clinical Pharmacist  10/23/2020  3:21 PM

## 2020-10-23 NOTE — H&P (Signed)
History and Physical    Gabriella Howe [id number] DOB: 06-05-1934 DOA: 10/23/2020  Referring MD/NP/PA:   PCP: Abner Greenspan, MD   Patient coming from:  The patient is coming from home.  At baseline, pt is independent for most of ADL.        Chief Complaint: Generalized weakness, fever, chills  HPI: Gabriella Howe is a 86 y.o. female with medical history significant of hypertension, hyperlipidemia, diabetes mellitus, asthma, GERD, kidney stone, CHF, CKD stage II, OSA not on CPAP, who presents with generalized weakness, fever and chills.  Patient states that she has been sick in the past 4 days.  She has generalized weakness, poor appetite, decreased oral intake.  She also has fever and chills, feeling cold. Patient has mild dry cough, no chest pain or shortness of breath.  Initially patient had nausea and vomited several times, which has resolved.  Currently patient does not have nausea, vomiting, abdominal pain or diarrhea.  Denies symptoms of UTI.  No hematuria.  ED Course: pt was found to have WBC 6.0, troponin level 24, negative COVID PCR, CK level 85, slightly worsening renal function, temperature 103, blood pressure 148/59, heart rate 120, oxygen saturation 92-93% on room air, RR 23, negative chest x-ray.  Patient is admitted to progressive bed as inpatient.  CT for renal stone: Mild left hydronephrosis stranding about the left kidney and proximal left ureter may be due to recent stone passage. Pyelonephritis is also possible. Negative for ureteral or urinary bladder stone.   3-4 small nonobstructing left renal stones.   Air in the urinary bladder is presumably due to instrumentation.   Small gallstones without evidence of cholecystitis.   Fatty infiltration of the liver.   Diverticulosis without diverticulitis.   Aortic Atherosclerosis (ICD10-I70.0).   Review of Systems:   General: has  fevers, chills, no body weight gain, has poor appetite, has fatigue HEENT:  no blurry vision, hearing changes or sore throat Respiratory: no dyspnea, has mild coughing, no wheezing CV: no chest pain, no palpitations GI: had nausea, vomiting, no abdominal pain, diarrhea, constipation GU: no dysuria, burning on urination, increased urinary frequency, hematuria  Ext: has leg edema Neuro: no unilateral weakness, numbness, or tingling, no vision change or hearing loss Skin: no rash, no skin tear. MSK: No muscle spasm, no deformity, no limitation of range of movement in spin Heme: No easy bruising.  Travel history: No recent long distant travel.  Allergy:  Allergies  Allergen Reactions   Rofecoxib     REACTION: reaction not known   Antihistamines, Chlorpheniramine-Type Palpitations   Sulfa Antibiotics Rash   Tetanus-Diphtheria Toxoids Td Other (See Comments)    REACTION: sick with fever    Past Medical History:  Diagnosis Date   Adenomatous colon polyp 2003   Benign neoplasm of colon    Carpal tunnel syndrome    much better   Chest pain, unspecified    CHF (congestive heart failure) (HCC)    diastolic   Complication of anesthesia    careful in positioning due to back pain   Degeneration of intervertebral disc, site unspecified    Diabetes mellitus    type II   Encounter for long-term (current) use of other medications    Fluid overload    GERD (gastroesophageal reflux disease)    Heart disease, unspecified    History of kidney stones    with stent/hospitalized   Hyperlipidemia    Hypertension    Obesity, unspecified  Rosacea    Shortness of breath    Unspecified asthma(493.90)    Unspecified sleep apnea    no cpap/ sleeps sitting up    Past Surgical History:  Procedure Laterality Date   ABDOMINAL HYSTERECTOMY     partial, fibroids   BOTOX INJECTION N/A 02/09/2020   Procedure: BOTOX INJECTION;  Surgeon: Hollice Espy, MD;  Location: [facility];  Service: Urology;  Laterality: N/A;   CYSTOSCOPY WITH STENT PLACEMENT Left 02/18/2019    Procedure: CYSTOSCOPY WITH STENT PLACEMENT;  Surgeon: Abbie Sons, MD;  Location: [facility];  Service: Urology;  Laterality: Left;   CYSTOSCOPY/URETEROSCOPY/HOLMIUM LASER/STENT PLACEMENT Left 04/01/2019   Procedure: CYSTOSCOPY/URETEROSCOPY/HOLMIUM LASER/STENT PLACEMENT;  Surgeon: Abbie Sons, MD;  Location: [facility];  Service: Urology;  Laterality: Left;   ESOPHAGOGASTRODUODENOSCOPY  2000   TONSILLECTOMY     UMBILICAL HERNIA REPAIR  01/2004   URETERAL STENT PLACEMENT  2/13   for L sided stone/ ecoli sepsis    Social History:  reports that she has never smoked. She has never used smokeless tobacco. She reports that she does not drink alcohol and does not use drugs.  Family History:  Family History  Problem Relation Age of Onset   Early death Father 27       train accident   Diabetes Mother    Heart disease Mother    Diabetes Sister    Diabetes Brother    Colon cancer Brother    Cancer Paternal Grandmother        unknown   Diabetes Sister      Prior to Admission medications   Medication Sig Start Date End Date Taking? Authorizing Provider  Accu-Chek Softclix Lancets lancets USE AS DIRECTED TO CHECK BLOOD SUGAR ONCE DAILY AND AS NEEDED. 09/23/20   Tower, Wynelle Fanny, MD  Ascorbic Acid (VITAMIN C) 500 MG tablet Take 500 mg by mouth daily.    [provider]  Aspirin-Caffeine (BC FAST PAIN RELIEF PO) Take 1 packet by mouth daily as needed (headache).    [provider]  benazepril (LOTENSIN) 20 MG tablet Take 1 tablet (20 mg total) by mouth daily. 07/08/20   Tower, Wynelle Fanny, MD  CALCIUM-VITAMIN D PO Take 600 mg by mouth daily.    [provider]  cholecalciferol (VITAMIN D) 25 MCG (1000 UNIT) tablet Take 1,000 Units by mouth daily.    [provider]  Cyanocobalamin (B-12 PO) Take 1,000 mcg by mouth daily.     [provider]  furosemide (LASIX) 40 MG tablet Take 1 tablet (40 mg total) by mouth daily. 07/08/20   Tower, Wynelle Fanny, MD  glipiZIDE  (GLUCOTROL XL) 10 MG 24 hr tablet Take 1 tablet (10 mg total) by mouth daily with breakfast. 07/08/20   Tower, Wynelle Fanny, MD  glucose blood (ACCU-CHEK AVIVA PLUS) test strip USE AS DIRECTED TO CHECK BLOOD SUGAR ONCE DAILY AND AS NEEDED. 09/23/20   Tower, Wynelle Fanny, MD  metFORMIN (GLUCOPHAGE) 1000 MG tablet Take 1 tablet (1,000 mg total) by mouth 2 (two) times daily with a meal. 07/08/20   Tower, Wynelle Fanny, MD  Multiple Vitamin (MULTIVITAMIN) capsule Take 1 capsule by mouth daily.    [provider]  naproxen sodium (ALEVE) 220 MG tablet Take 220 mg by mouth daily as needed (Pain).     [provider]  Omega-3 Fatty Acids (FISH OIL PO) Take 1,000 mg by mouth daily.    [provider]  omeprazole (PRILOSEC) 20 MG capsule Take 1  capsule (20 mg total) by mouth daily. 07/08/20   Tower, Wynelle Fanny, MD  Pseudoephedrine HCl (SINUS & ALLERGY 12 HOUR PO) Take 1 tablet by mouth daily.     [provider]  simvastatin (ZOCOR) 40 MG tablet Take 1 tablet (40 mg total) by mouth at bedtime. 07/08/20   Tower, Wynelle Fanny, MD  triamcinolone cream (KENALOG) 0.1 % APPLY TO AFFECTED AREA(S)  TOPICALLY TWO TIMES A DAY Patient taking differently: Apply 1 application topically daily as needed (Cut/infection). 02/17/19   Tower, Wynelle Fanny, MD  vitamin E 400 UNIT capsule Take 400 Units by mouth daily.    [provider]    Physical Exam: Vitals:   10/23/20 1241 10/23/20 1300 10/23/20 1357 10/23/20 1405  BP: 122/62 (!) 142/69  (!) 148/59  Pulse: 95 90  (!) 120  Resp: (!) 22 (!) 23  20  Temp:   (!) 103 F (39.4 C)   TempSrc:   Oral   SpO2: 94% 92%  93%  Weight:      Height:       General: Not in acute distress HEENT:       Eyes: PERRL, EOMI, no scleral icterus.       ENT: No discharge from the ears and nose, no pharynx injection, no tonsillar enlargement.        Neck: No JVD, no bruit, no mass felt. Heme: No neck lymph node enlargement. Cardiac: S1/S2, RRR, No murmurs, No gallops or  rubs. Respiratory: No rales, wheezing, rhonchi or rubs. GI: Soft, nondistended, nontender, no rebound pain, no organomegaly, BS present. GU: No hematuria Ext: has trace pitting leg edema bilaterally. 1+DP/PT pulse bilaterally. Musculoskeletal: No joint deformities, No joint redness or warmth, no limitation of ROM in spin. Skin: No rashes.  Neuro: Alert, oriented X3, cranial nerves II-XII grossly intact, moves all extremities normally.  Psych: Patient is not psychotic, no suicidal or hemocidal ideation.  Labs on Admission: I have personally reviewed following labs and imaging studies  CBC: Recent Labs  Lab 10/23/20 1400  WBC 6.0  NEUTROABS 5.1  HGB 13.6  HCT 39.9  MCV 93.4  PLT 123456   Basic Metabolic Panel: Recent Labs  Lab 10/23/20 1400  NA 137  K 3.6  CL 100  CO2 23  GLUCOSE 206*  BUN 22  CREATININE 1.12*  CALCIUM 9.2  MG 1.9   GFR: Estimated Creatinine Clearance: 38.2 mL/min (A) (by C-G formula based on SCr of 1.12 mg/dL (H)). Liver Function Tests: Recent Labs  Lab 10/23/20 1400  AST 19  ALT 15  ALKPHOS 62  BILITOT 2.5*  PROT 7.4  ALBUMIN 3.5   No results for input(s): LIPASE, AMYLASE in the last 168 hours. No results for input(s): AMMONIA in the last 168 hours. Coagulation Profile: No results for input(s): INR, PROTIME in the last 168 hours. Cardiac Enzymes: Recent Labs  Lab 10/23/20 1400  CKTOTAL 85   BNP (last 3 results) No results for input(s): PROBNP in the last 8760 hours. HbA1C: No results for input(s): HGBA1C in the last 72 hours. CBG: No results for input(s): GLUCAP in the last 168 hours. Lipid Profile: No results for input(s): CHOL, HDL, LDLCALC, TRIG, CHOLHDL, LDLDIRECT in the last 72 hours. Thyroid Function Tests: Recent Labs    10/23/20 1400  TSH 0.470  FREET4 1.03   Anemia Panel: No results for input(s): VITAMINB12, FOLATE, FERRITIN, TIBC, IRON, RETICCTPCT in the last 72 hours. Urine analysis:    Component Value Date/Time  COLORURINE YELLOW (A) 02/18/2019 1332   APPEARANCEUR Cloudy (A) 02/26/2020 1311   LABSPEC 1.015 02/18/2019 1332   LABSPEC 1.017 11/08/2012 1719   PHURINE 5.0 02/18/2019 1332   GLUCOSEU Negative 02/26/2020 1311   GLUCOSEU 50 mg/dL 11/08/2012 1719   HGBUR MODERATE (A) 02/18/2019 1332   BILIRUBINUR Negative 02/26/2020 1311   BILIRUBINUR Negative 11/08/2012 1719   KETONESUR 5 (A) 02/18/2019 1332   PROTEINUR 3+ (A) 02/26/2020 1311   PROTEINUR 30 (A) 02/18/2019 1332   UROBILINOGEN 0.2 09/26/2019 1559   NITRITE Positive (A) 02/26/2020 1311   NITRITE POSITIVE (A) 02/18/2019 1332   LEUKOCYTESUR 1+ (A) 02/26/2020 1311   LEUKOCYTESUR SMALL (A) 02/18/2019 1332   LEUKOCYTESUR 1+ 11/08/2012 1719   Sepsis Labs: '@LABRCNTIP'$ (procalcitonin:4,lacticidven:4) ) Recent Results (from the past 240 hour(s))  Resp Panel by RT-PCR (Flu A&B, Covid) Nasopharyngeal Swab     Status: None   Collection Time: 10/23/20 12:19 PM   Specimen: Nasopharyngeal Swab; Nasopharyngeal(NP) swabs in vial transport medium  Result Value Ref Range Status   SARS Coronavirus 2 by RT PCR NEGATIVE NEGATIVE Final    Comment: (NOTE) SARS-CoV-2 target nucleic acids are NOT DETECTED.  The SARS-CoV-2 RNA is generally detectable in upper respiratory specimens during the acute phase of infection. The lowest concentration of SARS-CoV-2 viral copies this assay can detect is 138 copies/mL. A negative result does not preclude SARS-Cov-2 infection and should not be used as the sole basis for treatment or other patient management decisions. A negative result may occur with  improper specimen collection/handling, submission of specimen other than nasopharyngeal swab, presence of viral mutation(s) within the areas targeted by this assay, and inadequate number of viral copies(<138 copies/mL). A negative result must be combined with clinical observations, patient history, and epidemiological information. The expected result is  Negative.  Fact Sheet for Patients:  EntrepreneurPulse.com.au  Fact Sheet for Healthcare Providers:  IncredibleEmployment.be  This test is no t yet approved or cleared by the [place] FDA and  has been authorized for detection and/or diagnosis of SARS-CoV-2 by FDA under an Emergency Use Authorization (EUA). This EUA will remain  in effect (meaning this test can be used) for the duration of the COVID-19 declaration under Section 564(b)(1) of the Act, 21 U.S.C.section 360bbb-3(b)(1), unless the authorization is terminated  or revoked sooner.       Influenza A by PCR NEGATIVE NEGATIVE Final   Influenza B by PCR NEGATIVE NEGATIVE Final    Comment: (NOTE) The Xpert Xpress SARS-CoV-2/FLU/RSV plus assay is intended as an aid in the diagnosis of influenza from Nasopharyngeal swab specimens and should not be used as a sole basis for treatment. Nasal washings and aspirates are unacceptable for Xpert Xpress SARS-CoV-2/FLU/RSV testing.  Fact Sheet for Patients: EntrepreneurPulse.com.au  Fact Sheet for Healthcare Providers: IncredibleEmployment.be  This test is not yet approved or cleared by the [place] FDA and has been authorized for detection and/or diagnosis of SARS-CoV-2 by FDA under an Emergency Use Authorization (EUA). This EUA will remain in effect (meaning this test can be used) for the duration of the COVID-19 declaration under Section 564(b)(1) of the Act, 21 U.S.C. section 360bbb-3(b)(1), unless the authorization is terminated or revoked.  Performed at [facility], [address], [place], [place] [address]      Radiological Exams on Admission: DG Chest 2 View  Result Date: 10/23/2020 CLINICAL DATA:  Cough and congestion.  Shortness of breath. EXAM: CHEST - 2 VIEW COMPARISON:  February 05, 2016 FINDINGS: The heart size  and mediastinal contours are within normal limits. Both lungs  are clear. The visualized skeletal structures are unremarkable. IMPRESSION: No active cardiopulmonary disease. Electronically Signed   By: Dorise Bullion III M.D   On: 10/23/2020 12:28   CT Renal Stone Study  Result Date: 10/23/2020 CLINICAL DATA:  The patient has felt ill with some vomiting since 10/19/2020. EXAM: CT ABDOMEN AND PELVIS WITHOUT CONTRAST TECHNIQUE: Multidetector CT imaging of the abdomen and pelvis was performed following the standard protocol without IV contrast. COMPARISON:  CT abdomen and pelvis 12/02/2019. FINDINGS: Lower chest: No pleural or pericardial effusion. Lung bases are clear. Hepatobiliary: Several small stones are seen in the gallbladder but there is no CT evidence of cholecystitis. The liver is low attenuating consistent with fatty infiltration. Biliary tree is negative. Pancreas: Unremarkable. No pancreatic ductal dilatation or surrounding inflammatory changes. Spleen: Normal in size without focal abnormality. Adrenals/Urinary Tract: The adrenal glands are negative. The right kidney appears normal. There is mild left hydronephrosis with some stranding about the left kidney and proximal ureter. 3-4 nonobstructing left renal stones measuring up to 0.6 cm in diameter. No ureteral stone on the right or left. No urinary bladder stones. There is some stranding about the left kidney and proximal ureter. Air is seen in the urinary bladder and presumably due to prior instrumentation. Stomach/Bowel: Stomach is within normal limits. The appendix is not identified but no evidence of appendicitis is seen. No evidence of bowel wall thickening, distention, or inflammatory changes. Descending and sigmoid colon diverticulosis noted. Vascular/Lymphatic: Aortic atherosclerosis. No enlarged abdominal or pelvic lymph nodes. Reproductive: Status post hysterectomy. No adnexal masses. Other: Postoperative change of umbilical hernia repair noted. Musculoskeletal: No acute or focal abnormality. Scoliosis  and multilevel thoracic and lumbar degenerative change noted. IMPRESSION: Mild left hydronephrosis stranding about the left kidney and proximal left ureter may be due to recent stone passage. Pyelonephritis is also possible. Negative for ureteral or urinary bladder stone. 3-4 small nonobstructing left renal stones. Air in the urinary bladder is presumably due to instrumentation. Small gallstones without evidence of cholecystitis. Fatty infiltration of the liver. Diverticulosis without diverticulitis. Aortic Atherosclerosis (ICD10-I70.0). Electronically Signed   By: Inge Rise M.D.   On: 10/23/2020 15:08     EKG: I have personally reviewed.  Sinus rhythm, QTC 433, tachycardia with heart rate of 133, LAD, poor R wave progression  Assessment/Plan Principal Problem:   Sepsis ([place]) Active Problems:   Type II diabetes mellitus with renal manifestations ([place])   Hyperlipidemia associated with type 2 diabetes mellitus ([place])   Essential hypertension   Asthma   CKD (chronic kidney disease), stage II   Elevated troponin   Sepsis [facility]): Patient has sepsis with tachycardia with heart rate 120, RR 23, fever of 103.  No leukocytosis.  Source of infection is not clear, but suspecting urinary tract infection or pyelonephritis. CT scan showed possible passing kidney stone.  Pending urinalysis now.  -Admitted to progressive bed as inpatient -Vancomycin and cefepime (patient received 1 dose of Flagyl in ED) -Blood culture and urine culture -Follow-up urinalysis -will get Procalcitonin and trend lactic acid levels per sepsis protocol. -IVF: 2L of NS bolus in ED, followed by 100 cc/h   Type II diabetes mellitus with renal manifestations [facility]): Recent A1c 6.7, well controlled.  Patient is taking metformin and glipizide -SSI  Hyperlipidemia associated with type 2 diabetes mellitus ([place]) -Zocor  Essential hypertension -Hold Lasix and Lotensin due to sepsis since patient at high risk of developing  hypotension -IV  hydralazine as needed  Asthma -Bronchodilators  CKD (chronic kidney disease), stage II: Slightly worsening.  Baseline creatinine 0.92 recently.  Her creatinine is 1.12, BUN 22 -IV fluid as above -Hold Lotensin and Lasix  Elevated troponin: Troponin 24.  No chest pain.  Most likely due to demand ischemia. -Trend troponin -Zocor -Check A1c, FLP         DVT ppx: SQ Lovenox Code Status: DNR (I discussed with patient and explained the meaning of CODE STATUS. Patient states that she absolutely wants to be DNR)  Family Communication: not done, no family member is at bed side. Disposition Plan:  Anticipate discharge back to previous environment Consults called:  none Admission status and Level of care: Progressive Cardiac:  as inpt     Status is: Inpatient  Remains inpatient appropriate because:Inpatient level of care appropriate due to severity of illness  Dispo: The patient is from: Home              Anticipated d/c is to: Home              Patient currently is not medically stable to d/c.   Difficult to place patient No          Date of Service 10/23/2020    [facility]   If 7PM-7AM, please contact night-coverage [email or web address] 10/23/2020, 4:36 PM

## 2020-10-23 NOTE — ED Notes (Signed)
[facility] RN aware of assigned bed

## 2020-10-23 NOTE — Consult Note (Signed)
PHARMACY -  BRIEF ANTIBIOTIC NOTE   Pharmacy has received consult(s) for Vancomycin & Cefepime from an ED provider.  The patient's profile has been reviewed for ht/wt/allergies/indication/available labs.    One time order(s) placed for: Vancomycin 2g IV x1 Cefepime 2g IV x1 Also receiving Metronidazole '500mg'$  IV x1  Further antibiotics/pharmacy consults should be ordered by admitting physician if indicated.                       Thank you, Lorna Dibble 10/23/2020  3:10 PM

## 2020-10-23 NOTE — ED Notes (Signed)
Attempted 2 times for IV, unsuccessful. Patient tolerated well

## 2020-10-23 NOTE — ED Notes (Signed)
Spoke with Mattie Marlin, rn who is ready to accept pt to floor.

## 2020-10-23 NOTE — ED Triage Notes (Signed)
Pt states she has been feeling sick since Tuesday- pt states that she was vomiting the first couple days, but has not since- pt states she took a home covid test and it was negative- pt states that she has not been eating much and feels weak

## 2020-10-24 DIAGNOSIS — N390 Urinary tract infection, site not specified: Secondary | ICD-10-CM

## 2020-10-24 DIAGNOSIS — B9620 Unspecified Escherichia coli [E. coli] as the cause of diseases classified elsewhere: Secondary | ICD-10-CM

## 2020-10-24 LAB — CBC
HCT: 36 % (ref 36.0–46.0)
Hemoglobin: 12 g/dL (ref 12.0–15.0)
MCH: 30.8 pg (ref 26.0–34.0)
MCHC: 33.3 g/dL (ref 30.0–36.0)
MCV: 92.5 fL (ref 80.0–100.0)
Platelets: 154 10*3/uL (ref 150–400)
RBC: 3.89 MIL/uL (ref 3.87–5.11)
RDW: 13 % (ref 11.5–15.5)
WBC: 8.6 10*3/uL (ref 4.0–10.5)
nRBC: 0 % (ref 0.0–0.2)

## 2020-10-24 LAB — BLOOD CULTURE ID PANEL (REFLEXED) - BCID2

## 2020-10-24 LAB — BASIC METABOLIC PANEL
Anion gap: 10 (ref 5–15)
BUN: 23 mg/dL (ref 8–23)
CO2: 23 mmol/L (ref 22–32)
Calcium: 8.6 mg/dL — ABNORMAL LOW (ref 8.9–10.3)
Chloride: 104 mmol/L (ref 98–111)
Creatinine, Ser: 0.95 mg/dL (ref 0.44–1.00)
GFR, Estimated: 59 mL/min — ABNORMAL LOW (ref >60)
Glucose, Bld: 174 mg/dL — ABNORMAL HIGH (ref 70–99)
Potassium: 3.5 mmol/L (ref 3.5–5.1)
Sodium: 137 mmol/L (ref 135–145)

## 2020-10-24 LAB — LIPID PANEL
Cholesterol: 105 mg/dL (ref 0–200)
HDL: 34 mg/dL — ABNORMAL LOW (ref >40)
LDL Cholesterol: 51 mg/dL (ref 0–99)
Total CHOL/HDL Ratio: 3.1 RATIO
Triglycerides: 100 mg/dL (ref <150)
VLDL: 20 mg/dL (ref 0–40)

## 2020-10-24 LAB — HEMOGLOBIN A1C
Hgb A1c MFr Bld: 7 % — ABNORMAL HIGH (ref 4.8–5.6)
Mean Plasma Glucose: 154.2 mg/dL

## 2020-10-24 LAB — GLUCOSE, CAPILLARY
Glucose-Capillary: 169 mg/dL — ABNORMAL HIGH (ref 70–99)
Glucose-Capillary: 184 mg/dL — ABNORMAL HIGH (ref 70–99)
Glucose-Capillary: 209 mg/dL — ABNORMAL HIGH (ref 70–99)
Glucose-Capillary: 159 mg/dL — ABNORMAL HIGH (ref 70–99)

## 2020-10-24 LAB — TROPONIN I (HIGH SENSITIVITY): Troponin I (High Sensitivity): 54 ng/L — ABNORMAL HIGH (ref <18)

## 2020-10-24 LAB — PROCALCITONIN: Procalcitonin: 1.59 ng/mL

## 2020-10-24 MED ORDER — SODIUM CHLORIDE 0.9 % IV SOLN
2.0000 g | INTRAVENOUS | Status: DC
  Administered 2020-10-24: 2 g via INTRAVENOUS
  Filled 2020-10-24 (×2): qty 20

## 2020-10-24 NOTE — Progress Notes (Signed)
[facility] NAME: Gabriella Howe    MR#:  [id number]  DATE OF BIRTH:  18-Jun-1934  SUBJECTIVE:   patient sitting out in the chair. Tolerating PO diet. Feels a lot better today no fever. No family at bedside. Denies any pain REVIEW OF SYSTEMS:   Review of Systems  Constitutional:  Negative for chills, fever and weight loss.  HENT:  Negative for ear discharge, ear pain and nosebleeds.   Eyes:  Negative for blurred vision, pain and discharge.  Respiratory:  Negative for sputum production, shortness of breath, wheezing and stridor.   Cardiovascular:  Negative for chest pain, palpitations, orthopnea and PND.  Gastrointestinal:  Negative for abdominal pain, diarrhea, nausea and vomiting.  Genitourinary:  Negative for frequency and urgency.  Musculoskeletal:  Negative for back pain and joint pain.  Neurological:  Positive for weakness. Negative for sensory change, speech change and focal weakness.  Psychiatric/Behavioral:  Negative for depression and hallucinations. The patient is not nervous/anxious.   Tolerating Diet:yes Tolerating PT: patient is very independent and ambulatory.  DRUG ALLERGIES:   Allergies  Allergen Reactions  . Rofecoxib     REACTION: reaction not known  . Antihistamines, Chlorpheniramine-Type Palpitations  . Sulfa Antibiotics Rash  . Tetanus-Diphtheria Toxoids Td Other (See Comments)    REACTION: sick with fever    VITALS:  Blood pressure (!) 127/54, pulse 73, temperature 98.9 F (37.2 C), temperature source Oral, resp. rate 18, height 5' (1.524 m), weight 91.2 kg, SpO2 92 %.  PHYSICAL EXAMINATION:   Physical Exam  GENERAL:  86 y.o.-year-old patient lying in the bed with no acute distress. obese LUNGS: Normal breath sounds bilaterally, no wheezing, rales, rhonchi. No use of accessory muscles of respiration.  CARDIOVASCULAR: S1, S2 normal. No murmurs, rubs, or gallops.  ABDOMEN: Soft, nontender,  nondistended. Bowel sounds present. No organomegaly or mass.  EXTREMITIES: No cyanosis, clubbing or edema b/l.    NEUROLOGIC: Cranial nerves II through XII are intact. No focal Motor or sensory deficits b/l.   PSYCHIATRIC:  patient is alert and oriented x 3.  SKIN: No obvious rash, lesion, or ulcer.   LABORATORY PANEL:  CBC Recent Labs  Lab 10/24/20 0613  WBC 8.6  HGB 12.0  HCT 36.0  PLT 154    Chemistries  Recent Labs  Lab 10/23/20 1400 10/24/20 0613  NA 137 137  K 3.6 3.5  CL 100 104  CO2 23 23  GLUCOSE 206* 174*  BUN 22 23  CREATININE 1.12* 0.95  CALCIUM 9.2 8.6*  MG 1.9  --   AST 19  --   ALT 15  --   ALKPHOS 62  --   BILITOT 2.5*  --    Cardiac Enzymes No results for input(s): TROPONINI in the last 168 hours. RADIOLOGY:  DG Chest 2 View  Result Date: 10/23/2020 CLINICAL DATA:  Cough and congestion.  Shortness of breath. EXAM: CHEST - 2 VIEW COMPARISON:  February 05, 2016 FINDINGS: The heart size and mediastinal contours are within normal limits. Both lungs are clear. The visualized skeletal structures are unremarkable. IMPRESSION: No active cardiopulmonary disease. Electronically Signed   By: Dorise Bullion III M.D   On: 10/23/2020 12:28   CT Renal Stone Study  Result Date: 10/23/2020 CLINICAL DATA:  The patient has felt ill with some vomiting since 10/19/2020. EXAM: CT ABDOMEN AND PELVIS WITHOUT CONTRAST TECHNIQUE: Multidetector CT imaging of the abdomen and pelvis was performed following the standard protocol  without IV contrast. COMPARISON:  CT abdomen and pelvis 12/02/2019. FINDINGS: Lower chest: No pleural or pericardial effusion. Lung bases are clear. Hepatobiliary: Several small stones are seen in the gallbladder but there is no CT evidence of cholecystitis. The liver is low attenuating consistent with fatty infiltration. Biliary tree is negative. Pancreas: Unremarkable. No pancreatic ductal dilatation or surrounding inflammatory changes. Spleen: Normal in  size without focal abnormality. Adrenals/Urinary Tract: The adrenal glands are negative. The right kidney appears normal. There is mild left hydronephrosis with some stranding about the left kidney and proximal ureter. 3-4 nonobstructing left renal stones measuring up to 0.6 cm in diameter. No ureteral stone on the right or left. No urinary bladder stones. There is some stranding about the left kidney and proximal ureter. Air is seen in the urinary bladder and presumably due to prior instrumentation. Stomach/Bowel: Stomach is within normal limits. The appendix is not identified but no evidence of appendicitis is seen. No evidence of bowel wall thickening, distention, or inflammatory changes. Descending and sigmoid colon diverticulosis noted. Vascular/Lymphatic: Aortic atherosclerosis. No enlarged abdominal or pelvic lymph nodes. Reproductive: Status post hysterectomy. No adnexal masses. Other: Postoperative change of umbilical hernia repair noted. Musculoskeletal: No acute or focal abnormality. Scoliosis and multilevel thoracic and lumbar degenerative change noted. IMPRESSION: Mild left hydronephrosis stranding about the left kidney and proximal left ureter may be due to recent stone passage. Pyelonephritis is also possible. Negative for ureteral or urinary bladder stone. 3-4 small nonobstructing left renal stones. Air in the urinary bladder is presumably due to instrumentation. Small gallstones without evidence of cholecystitis. Fatty infiltration of the liver. Diverticulosis without diverticulitis. Aortic Atherosclerosis (ICD10-I70.0). Electronically Signed   By: Inge Rise M.D.   On: 10/23/2020 15:08   ASSESSMENT AND PLAN:  BRANDEN FERRAR is a 86 y.o. female with medical history significant of hypertension, hyperlipidemia, diabetes mellitus, asthma, GERD, kidney stone, CHF, CKD stage II, OSA not on CPAP, who presents with generalized weakness, fever and chills.  Patient states that she has been sick in  the past 4 days.  She has generalized weakness, poor appetite, decreased oral intake.  CT for renal stone: Mild left hydronephrosis stranding about the left kidney and proximal left ureter may be due to recent stone passage. Pyelonephritis is also possible. Negative for ureteral or urinary bladder stone.  3-4 small nonobstructing left renal stones.  Air in the urinary bladder is presumably due to instrumentation.  Small gallstones without evidence of cholecystitis.  Fatty infiltration of the liver.  Diverticulosis without diverticulitis.    Sepsis (HCC)/ Ecoli/ UTI --Patient has sepsis with tachycardia with heart rate 120, RR 23, fever of 103.  No leukocytosis.  Source of infection is not clear, but suspecting urinary tract infection or pyelonephritis.  --CT scan showed possible passing kidney stone.  --BCID + Ecoli -UC pending -- patient received IV fluids per sepsis protocol. Tolerating PO as well. Hemodynamically stable. DC IV fluids. -- IV Rocephin -- elevated Pro calcitonin   Type II diabetes mellitus with renal manifestations [facility]): Recent A1c 6.7, well controlled.  Patient is taking metformin and glipizide -SSI   Hyperlipidemia associated with type 2 diabetes mellitus ([place]) -Zocor   Essential hypertension -Hold Lasix and Lotensin due to sepsis since patient at high risk of developing hypotension -IV hydralazine as needed   Asthma -Bronchodilators   CKD (chronic kidney disease), stage II --Slightly worsening.  Baseline creatinine 0.92 recently.  Her creatinine is 1.12, BUN 22 -IV fluid as above-- creatinine back to  normal. DC IV fluids -Hold Lotensin and Lasix   Elevated troponin: Troponin 24.  No chest pain.  Most likely due to demand ischemia. -Trend troponin -Zocor     DVT ppx: SQ Lovenox Code Status: DNR ( dr Blaine Hamper discussed with patient and explained the meaning of CODE STATUS. Patient states that she absolutely wants to be DNR)   Family Communication: not  done, no family member is at bed side. Disposition Plan:  Anticipate discharge back to previous environment Consults called:  none Admission status and Level of care: Progressive Cardiac:  as inpt      Status is: Inpatient    Dispo: The patient is from: Home              Anticipated d/c is to: Home              Patient currently is medically stable to d/c.              Difficult to place patient No       TOTAL TIME TAKING CARE OF THIS PATIENT: 30 minutes.  >50% time spent on counselling and coordination of care  Note: This dictation was prepared with Dragon dictation along with smaller phrase technology. Any transcriptional errors that result from this process are unintentional.  Fritzi Mandes M.D    [facility]   CC: Primary care physician; Tower, Wynelle Fanny, MD Patient ID: JAQUAY SLIMAK, female   DOB: September 03, 1934, 86 y.o.   MRN: [id number]

## 2020-10-24 NOTE — Plan of Care (Signed)

## 2020-10-24 NOTE — Evaluation (Signed)
Occupational Therapy Evaluation Patient Details Name: Gabriella Howe MRN: [id number] DOB: 1934-10-14 Today's Date: 10/24/2020    History of Present Illness Pt is an 86 y/o F with PMH: HTN, HLD, DM, asthma, GERD, kidney stone, CHF, CKD stage II, OSA not on CPAP, who presents with generalized weakness, fever and chills. Pt found to have kidney stones via CT with hydronephosis and sepsis.   Clinical Impression   Pt seen for OT evaluation this date in setting of acute hospitalization d/t sepsis. Pt reports being INDEP at baseline. 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 Pt with some general deconditioning, but upon assessment, it relatively close to her self-reported baseline. Pt able to perform fxl mobility in the room with no AD and no physical assistance. She is noted to have somewhat side-to-side step pattern and states it has been this way for a long time. Attributes to chronic pain. No sway or LOB noted. Pt endorses somewhat of a struggle to perform LB ADLs at baseline, but demos ability and appropriate ROM to complete the task despite requriing some increased time. Pt left in chair with all needs met and in reach at end of session. No perceived need for OT f/u at this time.    Follow Up Recommendations  No OT follow up    Equipment Recommendations  None recommended by OT    Recommendations for Other Services       Precautions / Restrictions Precautions Precautions: Fall Restrictions Weight Bearing Restrictions: No      Mobility Bed Mobility Overal bed mobility: Independent                  Transfers Overall transfer level: Modified independent               General transfer comment: increased time, but able to lift off with no physical assistance    Balance Overall balance assessment: Modified Independent;Mild deficits observed, not formally tested (different gait pattern at baseline that pt attributes to her  back)                                         ADL either performed or assessed with clinical judgement   ADL Overall ADL's : Modified independent;At baseline                                             Vision Baseline Vision/History: Wears glasses Wears Glasses: At all times Patient Visual Report: No change from baseline       Perception     Praxis      Pertinent Vitals/Pain Pain Assessment: 0-10 Pain Score: 4  Pain Location: back, chronic Pain Descriptors / Indicators: Sore Pain Intervention(s): Monitored during session     Hand Dominance     Extremity/Trunk Assessment Upper Extremity Assessment Upper Extremity Assessment: Overall WFL for tasks assessed   Lower Extremity Assessment Lower Extremity Assessment: Overall WFL for tasks assessed;Generalized weakness (limited tolerance, some hip ROM limitations at baseline. fxl ROM and strength for walking HH distances)       Communication Communication Communication: No difficulties   Cognition Arousal/Alertness: Awake/alert Behavior During Therapy: WFL for tasks assessed/performed Overall Cognitive Status: Within Functional Limits for tasks assessed  General Comments       Exercises Other Exercises Other Exercises: OT engages pt in fxl mobility in room with no AD and no physical assistance, pt able to dress herself. While she can struggle through getting socks on, she endorses that she primarily dons slip on shoes d/t difficulty. Pt performing ADLs at her functional baseline.   Shoulder Instructions      Home Living Family/patient expects to be discharged to:: Private residence Living Arrangements: Alone   Type of Home: House                       Home Equipment: Walker - 2 wheels          Prior Functioning/Environment Level of Independence: Independent        Comments: has walker, but doesn't use. Pt  still works in Insurance underwriter. Drives, does all her own errands.        OT Problem List: Decreased activity tolerance      OT Treatment/Interventions: Self-care/ADL training;Therapeutic activities    OT Goals(Current goals can be found in the care plan section) Acute Rehab OT Goals Patient Stated Goal: to go home OT Goal Formulation: All assessment and education complete, DC therapy  OT Frequency:     Barriers to D/C:            Co-evaluation              AM-PAC OT "6 Clicks" Daily Activity     Outcome Measure Help from another person eating meals?: None Help from another person taking care of personal grooming?: None Help from another person toileting, which includes using toliet, bedpan, or urinal?: None Help from another person bathing (including washing, rinsing, drying)?: None Help from another person to put on and taking off regular upper body clothing?: None Help from another person to put on and taking off regular lower body clothing?: None 6 Click Score: 24   End of Session Equipment Utilized During Treatment: Rolling walker Nurse Communication: Mobility status  Activity Tolerance: Patient tolerated treatment well Patient left: with call bell/phone within reach;in chair  OT Visit Diagnosis: Unsteadiness on feet (R26.81)                Time: 7124-5809 OT Time Calculation (min): 12 min Charges:  OT General Charges $OT Visit: 1 Visit OT Evaluation $OT Eval Low Complexity: 1 Low  Gerrianne Scale, MS, OTR/L ascom [phone] 10/24/20, 4:14 PM

## 2020-10-24 NOTE — Progress Notes (Signed)
PT Screen Note  Patient Details Name: Gabriella Howe MRN: [id number] DOB: 08/15/1934   Cancelled Treatment:    Reason Eval/Treat Not Completed: PT screened, no needs identified, will sign off Chart reviewed, on arrival pt in recliner looking comfortable and states "I'm doing well, I doubt I need you."  She was correct, she was able to ambulate 125 ft w/o AD and negotiate up/down steps w/o assist.  She did have some initial soreness/limp "because I've been in bed" that improved with increased ambulation and she reports feeling close to her baseline and confident that she can return home w/o further PT follow up here or at home. 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 Will complete orders and sign off.  PT would benefit from continued ambulation while in hospital to maintain mobility.   Kreg Shropshire, DPT 10/24/2020, 2:00 PM

## 2020-10-24 NOTE — Progress Notes (Signed)
PHARMACY - PHYSICIAN COMMUNICATION CRITICAL VALUE ALERT - BLOOD CULTURE IDENTIFICATION (BCID)  BCID results for this pt w/ suspected UTI, possible kidney stone:  2 of 2 bottles with E. Coli, no resistance genes.  Pt currently on Vancomycin and Cefepime.  Name of physician contacted: Prudy Feeler, MD  Changes to prescribed antibiotics required: Stop Vancomycin and continue Cefepime at this time.  Renda Rolls, PharmD, MBA 10/24/2020 6:18 AM

## 2020-10-25 ENCOUNTER — Telehealth: Payer: Self-pay

## 2020-10-25 LAB — GLUCOSE, CAPILLARY: Glucose-Capillary: 165 mg/dL — ABNORMAL HIGH (ref 70–99)

## 2020-10-25 MED ORDER — CEPHALEXIN 500 MG PO CAPS
500.0000 mg | ORAL_CAPSULE | Freq: Four times a day (QID) | ORAL | Status: DC
  Administered 2020-10-25: 500 mg via ORAL
  Filled 2020-10-25: qty 1

## 2020-10-25 MED ORDER — CEPHALEXIN 500 MG PO CAPS: 500.0000 mg | ORAL_CAPSULE | Freq: Four times a day (QID) | ORAL | 0 refills | Status: AC

## 2020-10-25 NOTE — Discharge Summary (Signed)
Gabriella Howe at [place] NAME: Gabriella Howe    MR#:  [id number]  DATE OF BIRTH:  [birth year]  DATE OF ADMISSION:  10/23/2020 ADMITTING PHYSICIAN: Ivor Costa, MD  DATE OF DISCHARGE: 10/25/2020  PRIMARY CARE PHYSICIAN: Tower, Wynelle Fanny, MD    ADMISSION DIAGNOSIS:  Weakness [R53.1] Sepsis ([place]) [A41.9] Sepsis, due to unspecified organism, unspecified whether acute organ dysfunction present ([place]) [A41.9]  DISCHARGE DIAGNOSIS:  E. coli sepsis improving  SECONDARY DIAGNOSIS:   Past Medical History:  Diagnosis Date  . Adenomatous colon polyp 2003  . Benign neoplasm of colon   . Carpal tunnel syndrome    much better  . Chest pain, unspecified   . CHF (congestive heart failure) (HCC)    diastolic  . Complication of anesthesia    careful in positioning due to back pain  . Degeneration of intervertebral disc, site unspecified   . Diabetes mellitus    type II  . Encounter for long-term (current) use of other medications   . Fluid overload   . GERD (gastroesophageal reflux disease)   . Heart disease, unspecified   . History of kidney stones    with stent/hospitalized  . Hyperlipidemia   . Hypertension   . Obesity, unspecified   . Rosacea   . Shortness of breath   . Unspecified asthma(493.90)   . Unspecified sleep apnea    no cpap/ sleeps sitting up    HOSPITAL COURSE:  Gabriella Howe is a 86 y.o. female with medical history significant of hypertension, hyperlipidemia, diabetes mellitus, asthma, GERD, kidney stone, CHF, CKD stage II, OSA not on CPAP, who presents with generalized weakness, fever and chills.  Patient states that she has been sick in the past 4 days.  She has generalized weakness, poor appetite, decreased oral intake.   CT for renal stone: Mild left hydronephrosis stranding about the left kidney and proximal left ureter may be due to recent stone passage. Pyelonephritis is also possible. Negative for ureteral or  urinary bladder stone.  3-4 small nonobstructing left renal stones.  Air in the urinary bladder is presumably due to instrumentation.  Small gallstones without evidence of cholecystitis.  Fatty infiltration of the liver.  Diverticulosis without diverticulitis.     Sepsis (HCC)/ Ecoli/ UTI --Patient has sepsis with tachycardia with heart rate 120, RR 23, fever of 103.  No leukocytosis.  Source of infection is not clear, but suspecting urinary tract infection or pyelonephritis.  --CT scan showed possible passing kidney stone.  --BCID + Ecoli -UC pending -- patient received IV fluids per sepsis protocol. Tolerating PO as well. Hemodynamically stable. DC IV fluids. -- IV Rocephin -- elevated Pro calcitonin --8/1-- patient remains afebrile. She is tolerating PO diet. Feels overall back to baseline other than mild weakness. She is requesting to go home. Will change her to PO Keflex total treatment 10 days   Type II diabetes mellitus with renal manifestations [facility]): Recent A1c 6.7, well controlled.  Patient is taking metformin and glipizide -SSI   Hyperlipidemia associated with type 2 diabetes mellitus ([place]) -Zocor   Essential hypertension -Hold Lasix and Lotensin due to sepsis since patient at high risk of developing hypotension -IV hydralazine as needed -- will resume home meds at discharge   Asthma -Bronchodilators   CKD (chronic kidney disease), stage II --Slightly worsening.  Baseline creatinine 0.92 recently.  Her creatinine is 1.12, BUN 22 -IV fluid as above-- creatinine back to normal. DC IV fluids   Elevated  troponin: Troponin 24.  No chest pain.  Most likely due to demand ischemia. -Trend troponin -Zocor       DVT ppx: SQ Lovenox Code Status: DNR ( dr Blaine Hamper discussed with patient and explained the meaning of CODE STATUS. Patient states that she absolutely wants to be DNR)   Family Communication: not done, no family member is at bed side. Patient tells me her family has  been updated Disposition Plan:  Anticipate discharge back to previous environment Consults called:  none Admission status and Level of care: Progressive Cardiac:  as inpt      Status is: Inpatient    Dispo: The patient is from: Home              Anticipated d/c is to: Home today              Patient currently is medically stable to d/c.              Difficult to place patient  CONSULTS OBTAINED:    DRUG ALLERGIES:   Allergies  Allergen Reactions  . Rofecoxib     REACTION: reaction not known  . Antihistamines, Chlorpheniramine-Type Palpitations  . Sulfa Antibiotics Rash  . Tetanus-Diphtheria Toxoids Td Other (See Comments)    REACTION: sick with fever    DISCHARGE MEDICATIONS:   Allergies as of 10/25/2020       Reactions   Rofecoxib    REACTION: reaction not known   Antihistamines, Chlorpheniramine-type Palpitations   Sulfa Antibiotics Rash   Tetanus-diphtheria Toxoids Td Other (See Comments)   REACTION: sick with fever        Medication List     TAKE these medications    Accu-Chek Aviva Plus test strip Generic drug: glucose blood USE AS DIRECTED TO CHECK BLOOD SUGAR ONCE DAILY AND AS NEEDED.   Accu-Chek Softclix Lancets lancets USE AS DIRECTED TO CHECK BLOOD SUGAR ONCE DAILY AND AS NEEDED.   B-12 PO Take 1,000 mcg by mouth daily.   BC FAST PAIN RELIEF PO Take 1 packet by mouth daily as needed (headache).   benazepril 20 MG tablet Commonly known as: LOTENSIN Take 1 tablet (20 mg total) by mouth daily.   CALCIUM-VITAMIN D PO Take 600 mg by mouth daily.   cephALEXin 500 MG capsule Commonly known as: KEFLEX Take 1 capsule (500 mg total) by mouth every 6 (six) hours for 8 days.   cholecalciferol 25 MCG (1000 UNIT) tablet Commonly known as: VITAMIN D Take 1,000 Units by mouth daily.   FISH OIL PO Take 1,000 mg by mouth daily.   furosemide 40 MG tablet Commonly known as: LASIX Take 1 tablet (40 mg total) by mouth daily.   glipiZIDE 10 MG 24 hr  tablet Commonly known as: GLUCOTROL XL Take 1 tablet (10 mg total) by mouth daily with breakfast.   metFORMIN 1000 MG tablet Commonly known as: GLUCOPHAGE Take 1 tablet (1,000 mg total) by mouth 2 (two) times daily with a meal.   multivitamin capsule Take 1 capsule by mouth daily.   naproxen sodium 220 MG tablet Commonly known as: ALEVE Take 220 mg by mouth daily as needed (Pain).   omeprazole 20 MG capsule Commonly known as: PRILOSEC Take 1 capsule (20 mg total) by mouth daily.   simvastatin 40 MG tablet Commonly known as: ZOCOR Take 1 tablet (40 mg total) by mouth at bedtime.   SINUS & ALLERGY 12 HOUR PO Take 1 tablet by mouth daily.   triamcinolone cream 0.1 %  Commonly known as: KENALOG APPLY TO AFFECTED AREA(S)  TOPICALLY TWO TIMES A DAY What changed: See the new instructions.   vitamin C 500 MG tablet Commonly known as: ASCORBIC ACID Take 500 mg by mouth daily.   vitamin E 180 MG (400 UNITS) capsule Take 400 Units by mouth daily.        If you experience worsening of your admission symptoms, develop shortness of breath, life threatening emergency, suicidal or homicidal thoughts you must seek medical attention immediately by calling 911 or calling your MD immediately  if symptoms less severe.  You Must read complete instructions/literature along with all the possible adverse reactions/side effects for all the Medicines you take and that have been prescribed to you. Take any new Medicines after you have completely understood and accept all the possible adverse reactions/side effects.   Please note  You were cared for by a hospitalist during your hospital stay. If you have any questions about your discharge medications or the care you received while you were in the hospital after you are discharged, you can call the unit and asked to speak with the hospitalist on call if the hospitalist that took care of you is not available. Once you are discharged, your primary  care physician will handle any further medical issues. Please note that NO REFILLS for any discharge medications will be authorized once you are discharged, as it is imperative that you return to your primary care physician (or establish a relationship with a primary care physician if you do not have one) for your aftercare needs so that they can reassess your need for medications and monitor your lab values. Today   SUBJECTIVE   doing well. Ate breakfast. Sitting out in the chair. No fever. No issues per RN. Patient requesting to go home  VITAL SIGNS:  Blood pressure (!) 158/72, pulse 72, temperature 97.7 F (36.5 C), temperature source Oral, resp. rate 15, height 5' (1.524 m), weight 91.2 kg, SpO2 95 %.  I/O:   Intake/Output Summary (Last 24 hours) at 10/25/2020 1049 Last data filed at 10/25/2020 1003 Gross per 24 hour  Intake 820 ml  Output --  Net 820 ml    PHYSICAL EXAMINATION:  GENERAL:  86 y.o.-year-old patient lying in the bed with no acute distress. obese LUNGS: Normal breath sounds bilaterally, no wheezing, rales,rhonchi or crepitation. No use of accessory muscles of respiration.  CARDIOVASCULAR: S1, S2 normal. No murmurs, rubs, or gallops.  ABDOMEN: Soft, non-tender, non-distended. Bowel sounds present. No organomegaly or mass.  EXTREMITIES: No pedal edema, cyanosis, or clubbing.  NEUROLOGIC: nonfocal. Self ambulatory PSYCHIATRIC: The patient is alert and oriented x 3.  SKIN: No obvious rash, lesion, or ulcer.   DATA REVIEW:   CBC  Recent Labs  Lab 10/24/20 0613  WBC 8.6  HGB 12.0  HCT 36.0  PLT 154    Chemistries  Recent Labs  Lab 10/23/20 1400 10/24/20 0613  NA 137 137  K 3.6 3.5  CL 100 104  CO2 23 23  GLUCOSE 206* 174*  BUN 22 23  CREATININE 1.12* 0.95  CALCIUM 9.2 8.6*  MG 1.9  --   AST 19  --   ALT 15  --   ALKPHOS 62  --   BILITOT 2.5*  --     Microbiology Results   Recent Results (from the past 240 hour(s))  Resp Panel by RT-PCR (Flu  A&B, Covid) Nasopharyngeal Swab     Status: None   Collection Time: 10/23/20 12:19  PM   Specimen: Nasopharyngeal Swab; Nasopharyngeal(NP) swabs in vial transport medium  Result Value Ref Range Status   SARS Coronavirus 2 by RT PCR NEGATIVE NEGATIVE Final    Comment: (NOTE) SARS-CoV-2 target nucleic acids are NOT DETECTED.  The SARS-CoV-2 RNA is generally detectable in upper respiratory specimens during the acute phase of infection. The lowest concentration of SARS-CoV-2 viral copies this assay can detect is 138 copies/mL. A negative result does not preclude SARS-Cov-2 infection and should not be used as the sole basis for treatment or other patient management decisions. A negative result may occur with  improper specimen collection/handling, submission of specimen other than nasopharyngeal swab, presence of viral mutation(s) within the areas targeted by this assay, and inadequate number of viral copies(<138 copies/mL). A negative result must be combined with clinical observations, patient history, and epidemiological information. The expected result is Negative.  Fact Sheet for Patients:  EntrepreneurPulse.com.au  Fact Sheet for Healthcare Providers:  IncredibleEmployment.be  This test is no t yet approved or cleared by the [place] FDA and  has been authorized for detection and/or diagnosis of SARS-CoV-2 by FDA under an Emergency Use Authorization (EUA). This EUA will remain  in effect (meaning this test can be used) for the duration of the COVID-19 declaration under Section 564(b)(1) of the Act, 21 U.S.C.section 360bbb-3(b)(1), unless the authorization is terminated  or revoked sooner.       Influenza A by PCR NEGATIVE NEGATIVE Final   Influenza B by PCR NEGATIVE NEGATIVE Final    Comment: (NOTE) The Xpert Xpress SARS-CoV-2/FLU/RSV plus assay is intended as an aid in the diagnosis of influenza from Nasopharyngeal swab specimens  and should not be used as a sole basis for treatment. Nasal washings and aspirates are unacceptable for Xpert Xpress SARS-CoV-2/FLU/RSV testing.  Fact Sheet for Patients: EntrepreneurPulse.com.au  Fact Sheet for Healthcare Providers: IncredibleEmployment.be  This test is not yet approved or cleared by the [place] FDA and has been authorized for detection and/or diagnosis of SARS-CoV-2 by FDA under an Emergency Use Authorization (EUA). This EUA will remain in effect (meaning this test can be used) for the duration of the COVID-19 declaration under Section 564(b)(1) of the Act, 21 U.S.C. section 360bbb-3(b)(1), unless the authorization is terminated or revoked.  Performed at [facility], [address] [place], [place] [address]   Blood culture (routine x 2)     Status: None (Preliminary result)   Collection Time: 10/23/20  3:28 PM   Specimen: BLOOD LEFT HAND  Result Value Ref Range Status   Specimen Description   Final    BLOOD LEFT HAND Performed at [facility], [address] [place], [place] [address]    Special Requests   Final    BOTTLES DRAWN AEROBIC AND ANAEROBIC Blood Culture adequate volume Performed at [facility], [address], [place], [place] [address]    Culture  Setup Time   Final    GRAM NEGATIVE RODS AEROBIC BOTTLE ONLY CRITICAL VALUE NOTED.  VALUE IS CONSISTENT WITH PREVIOUSLY REPORTED AND CALLED VALUE. Performed at [facility], [address], [place], [place] [address]    Culture   Final    Lonell Grandchild NEGATIVE RODS IDENTIFICATION TO FOLLOW Performed at [facility], [address] [place], [place] [address]    Report Status PENDING  Incomplete  Blood culture (routine x 2)     Status: Abnormal (Preliminary result)   Collection Time: 10/23/20  3:34 PM   Specimen: BLOOD  RIGHT HAND  Result Value Ref Range Status   Specimen Description   Final    BLOOD RIGHT  HAND Performed at [facility], [address], [place], [place] [address]    Special Requests   Final    BOTTLES DRAWN AEROBIC AND ANAEROBIC Blood Culture adequate volume Performed at [facility], [address] [place], [place] [address]    Culture  Setup Time   Final    Organism ID to follow IN BOTH AEROBIC AND ANAEROBIC BOTTLES GRAM NEGATIVE RODS CRITICAL RESULT CALLED TO, READ BACK BY AND VERIFIED WITHLloyd Huger @ 0458 10/24/20 LFD Performed at [facility], [address] [place], [place] [address]    Culture (A)  Final    ESCHERICHIA COLI SUSCEPTIBILITIES TO FOLLOW Performed at [facility], [address] [place], [place] [address]    Report Status PENDING  Incomplete  Blood Culture ID Panel (Reflexed)     Status: Abnormal   Collection Time: 10/23/20  3:34 PM  Result Value Ref Range Status   Enterococcus faecalis NOT DETECTED NOT DETECTED Final   Enterococcus Faecium NOT DETECTED NOT DETECTED Final   Listeria monocytogenes NOT DETECTED NOT DETECTED Final   Staphylococcus species NOT DETECTED NOT DETECTED Final   Staphylococcus aureus (BCID) NOT DETECTED NOT DETECTED Final   Staphylococcus epidermidis NOT DETECTED NOT DETECTED Final   Staphylococcus lugdunensis NOT DETECTED NOT DETECTED Final   Streptococcus species NOT DETECTED NOT DETECTED Final   Streptococcus agalactiae NOT DETECTED NOT DETECTED Final   Streptococcus pneumoniae NOT DETECTED NOT DETECTED Final   Streptococcus pyogenes NOT DETECTED NOT DETECTED Final   A.calcoaceticus-baumannii NOT DETECTED NOT DETECTED Final   Bacteroides fragilis NOT DETECTED NOT DETECTED Final   Enterobacterales DETECTED (A) NOT DETECTED Final    Comment: Enterobacterales represent a large order of gram negative bacteria, not a single organism.   Enterobacter cloacae complex NOT DETECTED NOT DETECTED Final   Escherichia coli DETECTED (A) NOT DETECTED Final    Comment: CRITICAL RESULT  CALLED TO, READ BACK BY AND VERIFIED WITH: NATHAN BELUE @ [id number] 10/24/20 LFD    Klebsiella aerogenes NOT DETECTED NOT DETECTED Final   Klebsiella oxytoca NOT DETECTED NOT DETECTED Final   Klebsiella pneumoniae NOT DETECTED NOT DETECTED Final   Proteus species NOT DETECTED NOT DETECTED Final   Salmonella species NOT DETECTED NOT DETECTED Final   Serratia marcescens NOT DETECTED NOT DETECTED Final   Haemophilus influenzae NOT DETECTED NOT DETECTED Final   Neisseria meningitidis NOT DETECTED NOT DETECTED Final   Pseudomonas aeruginosa NOT DETECTED NOT DETECTED Final   Stenotrophomonas maltophilia NOT DETECTED NOT DETECTED Final   Candida albicans NOT DETECTED NOT DETECTED Final   Candida auris NOT DETECTED NOT DETECTED Final   Candida glabrata NOT DETECTED NOT DETECTED Final   Candida krusei NOT DETECTED NOT DETECTED Final   Candida parapsilosis NOT DETECTED NOT DETECTED Final   Candida tropicalis NOT DETECTED NOT DETECTED Final   Cryptococcus neoformans/gattii NOT DETECTED NOT DETECTED Final   CTX-M ESBL NOT DETECTED NOT DETECTED Final   Carbapenem resistance IMP NOT DETECTED NOT DETECTED Final   Carbapenem resistance KPC NOT DETECTED NOT DETECTED Final   Carbapenem resistance NDM NOT DETECTED NOT DETECTED Final   Carbapenem resist OXA 48 LIKE NOT DETECTED NOT DETECTED Final   Carbapenem resistance VIM NOT DETECTED NOT DETECTED Final    Comment: Performed at [facility], [address], [place], [place] [address]    RADIOLOGY:  DG Chest 2 View  Result Date: 10/23/2020 CLINICAL DATA:  Cough and congestion.  Shortness of breath. EXAM: CHEST - 2 VIEW COMPARISON:  February 05, 2016 FINDINGS: The heart size and mediastinal contours are within normal limits. Both lungs are clear. The visualized skeletal structures are unremarkable. IMPRESSION: No active cardiopulmonary disease. Electronically Signed   By: Dorise Bullion III M.D   On: 10/23/2020 12:28   CT Renal Stone  Study  Result Date: 10/23/2020 CLINICAL DATA:  The patient has felt ill with some vomiting since 10/19/2020. EXAM: CT ABDOMEN AND PELVIS WITHOUT CONTRAST TECHNIQUE: Multidetector CT imaging of the abdomen and pelvis was performed following the standard protocol without IV contrast. COMPARISON:  CT abdomen and pelvis 12/02/2019. FINDINGS: Lower chest: No pleural or pericardial effusion. Lung bases are clear. Hepatobiliary: Several small stones are seen in the gallbladder but there is no CT evidence of cholecystitis. The liver is low attenuating consistent with fatty infiltration. Biliary tree is negative. Pancreas: Unremarkable. No pancreatic ductal dilatation or surrounding inflammatory changes. Spleen: Normal in size without focal abnormality. Adrenals/Urinary Tract: The adrenal glands are negative. The right kidney appears normal. There is mild left hydronephrosis with some stranding about the left kidney and proximal ureter. 3-4 nonobstructing left renal stones measuring up to 0.6 cm in diameter. No ureteral stone on the right or left. No urinary bladder stones. There is some stranding about the left kidney and proximal ureter. Air is seen in the urinary bladder and presumably due to prior instrumentation. Stomach/Bowel: Stomach is within normal limits. The appendix is not identified but no evidence of appendicitis is seen. No evidence of bowel wall thickening, distention, or inflammatory changes. Descending and sigmoid colon diverticulosis noted. Vascular/Lymphatic: Aortic atherosclerosis. No enlarged abdominal or pelvic lymph nodes. Reproductive: Status post hysterectomy. No adnexal masses. Other: Postoperative change of umbilical hernia repair noted. Musculoskeletal: No acute or focal abnormality. Scoliosis and multilevel thoracic and lumbar degenerative change noted. IMPRESSION: Mild left hydronephrosis stranding about the left kidney and proximal left ureter may be due to recent stone passage.  Pyelonephritis is also possible. Negative for ureteral or urinary bladder stone. 3-4 small nonobstructing left renal stones. Air in the urinary bladder is presumably due to instrumentation. Small gallstones without evidence of cholecystitis. Fatty infiltration of the liver. Diverticulosis without diverticulitis. Aortic Atherosclerosis (ICD10-I70.0). Electronically Signed   By: Inge Rise M.D.   On: 10/23/2020 15:08     CODE STATUS:     Code Status Orders  (From admission, onward)           Start     Ordered   10/23/20 1635  Do not attempt resuscitation (DNR)  Continuous        10/23/20 1634           Code Status History     Date Active Date Inactive Code Status Order ID Comments User Context   02/18/2019 1844 02/20/2019 1709 DNR [id number]  Annita Brod, MD ED      Advance Directive Documentation    Flowsheet Row Most Recent Value  Type of Advance Directive Healthcare Power of Attorney  Pre-existing out of facility DNR order (yellow form or pink MOST form) --  "MOST" Form in Place? --        TOTAL TIME TAKING CARE OF THIS PATIENT: 35 minutes.    Fritzi Mandes M.D  [facility]    CC: Primary care physician; Tower, Wynelle Fanny, MD

## 2020-10-25 NOTE — Telephone Encounter (Signed)
Transition Care Management Follow-up Telephone Call Date of discharge and from where: 10/25/2020, [facility] How have you been since you were released from the hospital? Patient is feeling much better.  Any questions or concerns? No  Items Reviewed: Did the pt receive and understand the discharge instructions provided? Yes  Medications obtained and verified? Yes  Other? No  Any new allergies since your discharge? No  Dietary orders reviewed? Yes Do you have support at home? Yes   Home Care and Equipment/Supplies: Were home health services ordered? not applicable If so, what is the name of the agency? N/A  Has the agency set up a time to come to the patient's home? not applicable Were any new equipment or medical supplies ordered?  No What is the name of the medical supply agency? N/A Were you able to get the supplies/equipment? not applicable Do you have any questions related to the use of the equipment or supplies? No  Functional Questionnaire: (I = Independent and D = Dependent) ADLs: I  Bathing/Dressing- I  Meal Prep- I  Eating- I  Maintaining continence- I  Transferring/Ambulation- I  Managing Meds- I  Follow up appointments reviewed:  PCP Hospital f/u appt confirmed? Yes  Scheduled to see Dr. Glori Bickers on 11/02/2020 @ 11 am. [facility] f/u appt confirmed?  N/A   Are transportation arrangements needed? No  If their condition worsens, is the pt aware to call PCP or go to the Emergency Dept.? Yes Was the patient provided with contact information for the PCP's office or ED? Yes Was to pt encouraged to call back with questions or concerns? Yes

## 2020-10-26 LAB — CULTURE, BLOOD (ROUTINE X 2)
Special Requests: ADEQUATE
Special Requests: ADEQUATE

## 2020-10-29 ENCOUNTER — Emergency Department
Admission: EM | Admit: 2020-10-29 | Discharge: 2020-10-29 | Disposition: A | Discharge status: Home / Self Care | Payer: Medicare Other | Attending: Emergency Medicine | Admitting: Emergency Medicine

## 2020-10-29 ENCOUNTER — Other Ambulatory Visit: Payer: Self-pay

## 2020-10-29 DIAGNOSIS — R109 Unspecified abdominal pain: Secondary | ICD-10-CM | POA: Insufficient documentation

## 2020-10-29 DIAGNOSIS — Z5321 Procedure and treatment not carried out due to patient leaving prior to being seen by health care provider: Secondary | ICD-10-CM | POA: Diagnosis not present

## 2020-10-29 LAB — URINALYSIS, COMPLETE (UACMP) WITH MICROSCOPIC
Bacteria, UA: NONE SEEN
Bilirubin Urine: NEGATIVE
Glucose, UA: NEGATIVE mg/dL
Ketones, ur: NEGATIVE mg/dL
Nitrite: NEGATIVE
Protein, ur: NEGATIVE mg/dL
Specific Gravity, Urine: 1.006 (ref 1.005–1.030)
WBC, UA: >50 WBC/hpf — ABNORMAL HIGH (ref 0–5)
pH: 6 (ref 5.0–8.0)

## 2020-10-29 LAB — COMPREHENSIVE METABOLIC PANEL
ALT: 23 U/L (ref 0–44)
AST: 30 U/L (ref 15–41)
Albumin: 3.7 g/dL (ref 3.5–5.0)
Alkaline Phosphatase: 72 U/L (ref 38–126)
Anion gap: 11 (ref 5–15)
BUN: 12 mg/dL (ref 8–23)
CO2: 25 mmol/L (ref 22–32)
Calcium: 9.6 mg/dL (ref 8.9–10.3)
Chloride: 106 mmol/L (ref 98–111)
Creatinine, Ser: 0.93 mg/dL (ref 0.44–1.00)
GFR, Estimated: >60 mL/min (ref >60)
Glucose, Bld: 169 mg/dL — ABNORMAL HIGH (ref 70–99)
Potassium: 3.2 mmol/L — ABNORMAL LOW (ref 3.5–5.1)
Sodium: 142 mmol/L (ref 135–145)
Total Bilirubin: 0.8 mg/dL (ref 0.3–1.2)
Total Protein: 6.9 g/dL (ref 6.5–8.1)

## 2020-10-29 LAB — CBC
HCT: 39.3 % (ref 36.0–46.0)
Hemoglobin: 13.6 g/dL (ref 12.0–15.0)
MCH: 31.9 pg (ref 26.0–34.0)
MCHC: 34.6 g/dL (ref 30.0–36.0)
MCV: 92.3 fL (ref 80.0–100.0)
Platelets: 305 10*3/uL (ref 150–400)
RBC: 4.26 MIL/uL (ref 3.87–5.11)
RDW: 12.8 % (ref 11.5–15.5)
WBC: 9.3 10*3/uL (ref 4.0–10.5)
nRBC: 0 % (ref 0.0–0.2)

## 2020-10-29 LAB — LACTIC ACID, PLASMA: Lactic Acid, Venous: 3.2 mmol/L (ref 0.5–1.9)

## 2020-10-29 NOTE — ED Notes (Signed)
See note in chart

## 2020-10-29 NOTE — ED Triage Notes (Signed)
Pt states left flank pain that began today. Pt states pain is getting better, pt states she was discharged on Monday for e coli in blood. Pt is taking ciprofloxin '500mg'$  po q6 hours. Pt denies known hematuria, dysuria, fever, vomiting.

## 2020-10-29 NOTE — ED Triage Notes (Signed)
Pt reports she is leaving. Discouraged her to stay due to abnormal labs. Pt reports has an appointment with her MD on Monday and will return if she gets worse.

## 2020-11-01 ENCOUNTER — Ambulatory Visit: Payer: Medicare Other | Admitting: Family Medicine

## 2020-11-02 ENCOUNTER — Other Ambulatory Visit: Payer: Self-pay

## 2020-11-02 ENCOUNTER — Encounter: Payer: Self-pay | Admitting: Family Medicine

## 2020-11-02 ENCOUNTER — Ambulatory Visit (INDEPENDENT_AMBULATORY_CARE_PROVIDER_SITE_OTHER): Payer: Medicare Other | Admitting: Family Medicine

## 2020-11-02 VITALS — BP 134/70 | HR 70 | Temp 97.6°F | Ht 60.0 in | Wt 203.1 lb

## 2020-11-02 DIAGNOSIS — J4520 Mild intermittent asthma, uncomplicated: Secondary | ICD-10-CM

## 2020-11-02 DIAGNOSIS — N200 Calculus of kidney: Secondary | ICD-10-CM

## 2020-11-02 DIAGNOSIS — A419 Sepsis, unspecified organism: Secondary | ICD-10-CM

## 2020-11-02 DIAGNOSIS — R059 Cough, unspecified: Secondary | ICD-10-CM | POA: Diagnosis not present

## 2020-11-02 DIAGNOSIS — B9620 Unspecified Escherichia coli [E. coli] as the cause of diseases classified elsewhere: Secondary | ICD-10-CM

## 2020-11-02 DIAGNOSIS — N390 Urinary tract infection, site not specified: Secondary | ICD-10-CM | POA: Diagnosis not present

## 2020-11-02 DIAGNOSIS — I10 Essential (primary) hypertension: Secondary | ICD-10-CM

## 2020-11-02 DIAGNOSIS — N182 Chronic kidney disease, stage 2 (mild): Secondary | ICD-10-CM

## 2020-11-02 DIAGNOSIS — R8290 Unspecified abnormal findings in urine: Secondary | ICD-10-CM

## 2020-11-02 LAB — BASIC METABOLIC PANEL
BUN: 18 mg/dL (ref 6–23)
CO2: 32 mEq/L (ref 19–32)
Calcium: 9.9 mg/dL (ref 8.4–10.5)
Chloride: 101 mEq/L (ref 96–112)
Creatinine, Ser: 0.91 mg/dL (ref 0.40–1.20)
GFR: 58.15 mL/min — ABNORMAL LOW (ref >60.00)
Glucose, Bld: 191 mg/dL — ABNORMAL HIGH (ref 70–99)
Potassium: 3.8 mEq/L (ref 3.5–5.1)
Sodium: 141 mEq/L (ref 135–145)

## 2020-11-02 LAB — POC URINALSYSI DIPSTICK (AUTOMATED)
Bilirubin, UA: NEGATIVE
Blood, UA: NEGATIVE
Glucose, UA: NEGATIVE
Ketones, UA: NEGATIVE
Nitrite, UA: NEGATIVE
Protein, UA: POSITIVE — AB
Spec Grav, UA: 1.02 (ref 1.010–1.025)
Urobilinogen, UA: 0.2 E.U./dL
pH, UA: 6 (ref 5.0–8.0)

## 2020-11-02 MED ORDER — LOSARTAN POTASSIUM 50 MG PO TABS: 50.0000 mg | ORAL_TABLET | Freq: Every day | ORAL | 3 refills | Status: DC

## 2020-11-02 NOTE — Assessment & Plan Note (Signed)
Recent uti and sepsis  (proteus also) Reviewed hospital records, lab results and studies in detail   ua some leuk-pend culture Will finish keflex and follow closely  Clinically much improved  Enc good fluid intake

## 2020-11-02 NOTE — Assessment & Plan Note (Signed)
Recent hospitalization for e coli sepsis  Reviewed hospital records, lab results and studies in detail   Finishing keflex soon  ua -some wbc, pend cx and will tx if pos  Knows what to watch for (malaise, fever) and update  Enc good fluids Rest and adv activity gradually  bmet today

## 2020-11-02 NOTE — Assessment & Plan Note (Signed)
occ needs mdi albuterol if she has uri

## 2020-11-02 NOTE — Patient Instructions (Signed)
Keep up a good fluid intake   Stop the benazapril  Replace it with losartan If cough does not go away, let us know   Gabriella Howe the antibiotic I placed a urology referrals so you will get a call

## 2020-11-02 NOTE — Assessment & Plan Note (Signed)
Dry nagging cough Poss from ace Nl lung exam  Changed ace to arb today (losartan) inst pt to check in if not improved

## 2020-11-02 NOTE — Assessment & Plan Note (Signed)
Suspect pt passed 2 recently  uti and sepsis as well  Back pain is better  uti symptoms improved Reviewed hospital records, lab results and studies in detail  Ref made for urology to check in

## 2020-11-02 NOTE — Assessment & Plan Note (Signed)
Stable Low K last check -re check today (on lasix) Enc good fluid intake

## 2020-11-02 NOTE — Assessment & Plan Note (Signed)
bp in fair control at this time  BP Readings from Last 1 Encounters:  11/02/20 134/70   No changes needed Most recent labs reviewed  Disc lifstyle change with low sodium diet and exercise  In light of cough (? From ace) will change benazepril to losartan 50 mg daily and update Continue lasix 40 mg daily with good fluid intake

## 2020-11-02 NOTE — Progress Notes (Signed)
Subjective:    Patient ID: Gabriella Howe, female    DOB: 03/01/1935, 86 y.o.   MRN: [id number]  This visit occurred during the SARS-CoV-2 public health emergency.  Safety protocols were in place, including screening questions prior to the visit, additional usage of staff PPE, and extensive cleaning of exam room while observing appropriate contact time as indicated for disinfecting solutions.   HPI Pt presents for f/u of hospitalization from 7/30 to 8/1 for e coli sepsis    She presented with weakness and fever and chills for 4 days  Noted hydronephrosis/renal stone  Hosp course as follows with CT  Renal stone  CT for renal stone: Mild left hydronephrosis stranding about the left kidney and proximal left ureter may be due to recent stone passage. Pyelonephritis is also possible. Negative for ureteral or urinary bladder stone.  3-4 small nonobstructing left renal stones.  Air in the urinary bladder is presumably due to instrumentation.  Small gallstones without evidence of cholecystitis.  Fatty infiltration of the liver.  Diverticulosis without diverticulitis.     Sepsis (HCC)/ Ecoli/ UTI --Patient has sepsis with tachycardia with heart rate 120, RR 23, fever of 103.  No leukocytosis.  Source of infection is not clear, but suspecting urinary tract infection or pyelonephritis.  --CT scan showed possible passing kidney stone.  --BCID + Ecoli -UC pending -- patient received IV fluids per sepsis protocol. Tolerating PO as well. Hemodynamically stable. DC IV fluids. -- IV Rocephin -- elevated Pro calcitonin --8/1-- patient remains afebrile. She is tolerating PO diet. Feels overall back to baseline other than mild weakness. She is requesting to go home. Will change her to PO Keflex total treatment 10 days   Type II diabetes mellitus with renal manifestations [facility]): Recent A1c 6.7, well controlled.  Patient is taking metformin and glipizide -SSI   Hyperlipidemia associated with  type 2 diabetes mellitus ([place]) -Zocor   Essential hypertension -Hold Lasix and Lotensin due to sepsis since patient at high risk of developing hypotension -IV hydralazine as needed -- will resume home meds at discharge   Asthma -Bronchodilators   CKD (chronic kidney disease), stage II --Slightly worsening.  Baseline creatinine 0.92 recently.  Her creatinine is 1.12, BUN 22 -IV fluid as above-- creatinine back to normal. DC IV fluids   Elevated troponin: Troponin 24.  No chest pain.  Most likely due to demand ischemia. -Trend troponin -Zocor   Most recent labs Lab Results  Component Value Date   CREATININE 0.93 10/29/2020   BUN 12 10/29/2020   NA 142 10/29/2020   K 3.2 (L) 10/29/2020   CL 106 10/29/2020   CO2 25 10/29/2020   Lab Results  Component Value Date   ALT 23 10/29/2020   AST 30 10/29/2020   ALKPHOS 72 10/29/2020   BILITOT 0.8 10/29/2020   Lab Results  Component Value Date   WBC 9.3 10/29/2020   HGB 13.6 10/29/2020   HCT 39.3 10/29/2020   MCV 92.3 10/29/2020   PLT 305 10/29/2020   Blood culture showed enerobacterales, e coli  Sensitive to cefuroxime   Urine culture showed proteus  Sensitive to cephalosporin    Today: Wt Readings from Last 3 Encounters:  11/02/20 203 lb 1 oz (92.1 kg)  10/24/20 201 lb (91.2 kg)  10/12/20 204 lb 2 oz (92.6 kg)   39.66 kg/m  BP Readings from Last 3 Encounters:  11/02/20 134/70  10/25/20 (!) 158/72  10/12/20 134/72   Pulse Readings from Last 3 Encounters:  11/02/20 70  10/25/20 72  10/12/20 89   She had lithotripsy 2 y ago  Has passed stones ever since   She went back to the ER on Friday for back pain  (left side)  Thought she had another kidney stone and thinks she then passed it  No blood in urine  Better now   Ua showed small rbc and some wbc   Her incontinence is better- had contemplated botox but changed her mind    Finished abx today   Ua : Results for orders placed or performed in visit  on 11/02/20  POCT Urinalysis Dipstick (Automated)  Result Value Ref Range   Color, UA Yellow    Clarity, UA Cloudy    Glucose, UA Negative Negative   Bilirubin, UA Negative    Ketones, UA Negative    Spec Grav, UA 1.020 1.010 - 1.025   Blood, UA Negative    pH, UA 6.0 5.0 - 8.0   Protein, UA Positive (A) Negative   Urobilinogen, UA 0.2 0.2 or 1.0 E.U./dL   Nitrite, UA Negative    Leukocytes, UA Moderate (2+) (A) Negative   No more blood Sent for culture   Also mentions dry cough since this started  No heartburn (omeprazole)    Patient Active Problem List   Diagnosis Date Noted   Cough 11/02/2020   Kidney stones 11/02/2020   Sepsis ([place]) 10/23/2020   Asthma 10/23/2020   CKD (chronic kidney disease), stage II 10/23/2020   E-coli UTI 09/03/2019   Pedal edema 08/05/2019   DJD (degenerative joint disease) 02/26/2019   Iron deficiency anemia 04/30/2017   History of shingles 10/03/2016   Routine general medical examination at a health care facility 04/14/2015   Knee pain 07/28/2014   Incomplete emptying of bladder 11/28/2012   Frequency of urination 11/28/2012   Encounter for Medicare annual wellness exam 11/19/2012   History of kidney stones 0000000   DIASTOLIC HEART FAILURE, CHRONIC 07/29/2009   Hyperlipidemia associated with type 2 diabetes mellitus ([place]) 09/13/2006   Morbid obesity ([place]) 09/10/2006   CARPAL TUNNEL SYNDROME 09/10/2006   Essential hypertension [id number]   DIASTOLIC DYSFUNCTION [id number]   REACTIVE AIRWAY DISEASE 09/10/2006   GERD 09/10/2006   ROSACEA 09/10/2006   Degeneration of lumbar or lumbosacral intervertebral disc 09/10/2006   OSA (obstructive sleep apnea) 09/10/2006   COLONIC POLYPS, HX OF 09/10/2006   Type II diabetes mellitus with renal manifestations ([place]) 09/06/2006   Past Medical History:  Diagnosis Date   Adenomatous colon polyp 2003   Benign neoplasm of colon    Carpal tunnel syndrome    much better   Chest pain, unspecified     CHF (congestive heart failure) (HCC)    diastolic   Complication of anesthesia    careful in positioning due to back pain   Degeneration of intervertebral disc, site unspecified    Diabetes mellitus    type II   Encounter for long-term (current) use of other medications    Fluid overload    GERD (gastroesophageal reflux disease)    Heart disease, unspecified    History of kidney stones    with stent/hospitalized   Hyperlipidemia    Hypertension    Obesity, unspecified    Rosacea    Shortness of breath    Unspecified asthma(493.90)    Unspecified sleep apnea    no cpap/ sleeps sitting up   Past Surgical History:  Procedure Laterality Date   ABDOMINAL HYSTERECTOMY     partial,  fibroids   BOTOX INJECTION N/A 02/09/2020   Procedure: BOTOX INJECTION;  Surgeon: Hollice Espy, MD;  Location: [facility];  Service: Urology;  Laterality: N/A;   CYSTOSCOPY WITH STENT PLACEMENT Left 02/18/2019   Procedure: CYSTOSCOPY WITH STENT PLACEMENT;  Surgeon: Abbie Sons, MD;  Location: [facility];  Service: Urology;  Laterality: Left;   CYSTOSCOPY/URETEROSCOPY/HOLMIUM LASER/STENT PLACEMENT Left 04/01/2019   Procedure: CYSTOSCOPY/URETEROSCOPY/HOLMIUM LASER/STENT PLACEMENT;  Surgeon: Abbie Sons, MD;  Location: [facility];  Service: Urology;  Laterality: Left;   ESOPHAGOGASTRODUODENOSCOPY  2000   TONSILLECTOMY     UMBILICAL HERNIA REPAIR  01/2004   URETERAL STENT PLACEMENT  2/13   for L sided stone/ ecoli sepsis   Social History   Tobacco Use   Smoking status: Never   Smokeless tobacco: Never  Vaping Use   Vaping Use: Never used  Substance Use Topics   Alcohol use: No    Alcohol/week: 0.0 standard drinks   Drug use: No   Family History  Problem Relation Age of Onset   Early death Father 50       train accident   Diabetes Mother    Heart disease Mother    Diabetes Sister    Diabetes Brother    Colon cancer Brother    Cancer Paternal Grandmother        unknown   Diabetes  Sister    Allergies  Allergen Reactions   Rofecoxib     REACTION: reaction not known   Antihistamines, Chlorpheniramine-Type Palpitations   Sulfa Antibiotics Rash   Tetanus-Diphtheria Toxoids Td Other (See Comments)    REACTION: sick with fever   Current Outpatient Medications on File Prior to Visit  Medication Sig Dispense Refill   Accu-Chek Softclix Lancets lancets USE AS DIRECTED TO CHECK BLOOD SUGAR ONCE DAILY AND AS NEEDED. 100 each 3   Ascorbic Acid (VITAMIN C) 500 MG tablet Take 500 mg by mouth daily.     Aspirin-Caffeine (BC FAST PAIN RELIEF PO) Take 1 packet by mouth daily as needed (headache).     CALCIUM-VITAMIN D PO Take 600 mg by mouth daily.     cephALEXin (KEFLEX) 500 MG capsule Take 1 capsule (500 mg total) by mouth every 6 (six) hours for 8 days. 32 capsule 0   cholecalciferol (VITAMIN D) 25 MCG (1000 UNIT) tablet Take 1,000 Units by mouth daily.     Cyanocobalamin (B-12 PO) Take 1,000 mcg by mouth daily.      furosemide (LASIX) 40 MG tablet Take 1 tablet (40 mg total) by mouth daily. 90 tablet 3   glipiZIDE (GLUCOTROL XL) 10 MG 24 hr tablet Take 1 tablet (10 mg total) by mouth daily with breakfast. 90 tablet 3   glucose blood (ACCU-CHEK AVIVA PLUS) test strip USE AS DIRECTED TO CHECK BLOOD SUGAR ONCE DAILY AND AS NEEDED. 90 each 3   metFORMIN (GLUCOPHAGE) 1000 MG tablet Take 1 tablet (1,000 mg total) by mouth 2 (two) times daily with a meal. 180 tablet 3   Multiple Vitamin (MULTIVITAMIN) capsule Take 1 capsule by mouth daily.     naproxen sodium (ALEVE) 220 MG tablet Take 220 mg by mouth daily as needed (Pain).      Omega-3 Fatty Acids (FISH OIL PO) Take 1,000 mg by mouth daily.     omeprazole (PRILOSEC) 20 MG capsule Take 1 capsule (20 mg total) by mouth daily. 90 capsule 3   Pseudoephedrine HCl (SINUS & ALLERGY 12 HOUR PO) Take 1 tablet by mouth daily.  simvastatin (ZOCOR) 40 MG tablet Take 1 tablet (40 mg total) by mouth at bedtime. 90 tablet 3   triamcinolone  cream (KENALOG) 0.1 % APPLY TO AFFECTED AREA(S)  TOPICALLY TWO TIMES A DAY 30 g 1   vitamin E 400 UNIT capsule Take 400 Units by mouth daily.     No current facility-administered medications on file prior to visit.     Review of Systems  Constitutional:  Positive for fever. Negative for activity change, appetite change, fatigue and unexpected weight change.  HENT:  Negative for congestion, ear pain, rhinorrhea, sinus pressure and sore throat.   Eyes:  Negative for pain, redness and visual disturbance.  Respiratory:  Negative for cough, shortness of breath and wheezing.   Cardiovascular:  Negative for chest pain and palpitations.  Gastrointestinal:  Negative for abdominal pain, blood in stool, constipation and diarrhea.  Endocrine: Negative for polydipsia and polyuria.  Genitourinary:  Negative for dysuria, frequency and urgency.  Musculoskeletal:  Negative for arthralgias, back pain and myalgias.  Skin:  Negative for pallor and rash.  Allergic/Immunologic: Negative for environmental allergies.  Neurological:  Negative for dizziness, syncope and headaches.  Hematological:  Negative for adenopathy. Does not bruise/bleed easily.  Psychiatric/Behavioral:  Negative for decreased concentration and dysphoric mood. The patient is not nervous/anxious.       Objective:   Physical Exam Constitutional:      General: She is not in acute distress.    Appearance: Normal appearance. She is well-developed. She is obese. She is not ill-appearing or diaphoretic.  HENT:     Head: Normocephalic and atraumatic.  Eyes:     Conjunctiva/sclera: Conjunctivae normal.     Pupils: Pupils are equal, round, and reactive to light.  Neck:     Thyroid: No thyromegaly.     Vascular: No carotid bruit or JVD.  Cardiovascular:     Rate and Rhythm: Normal rate and regular rhythm.     Heart sounds: Normal heart sounds.    No gallop.  Pulmonary:     Effort: Pulmonary effort is normal. No respiratory distress.      Breath sounds: Normal breath sounds. No wheezing or rales.  Abdominal:     General: Bowel sounds are normal. There is no distension or abdominal bruit.     Palpations: Abdomen is soft. There is no mass.     Tenderness: There is no abdominal tenderness. There is no right CVA tenderness or left CVA tenderness.     Comments: No suprapubic tenderness or fullness    Musculoskeletal:     Cervical back: Normal range of motion and neck supple.     Right lower leg: No edema.     Left lower leg: No edema.  Lymphadenopathy:     Cervical: No cervical adenopathy.  Skin:    General: Skin is warm and dry.     Coloration: Skin is not pale.     Findings: No rash.  Neurological:     Mental Status: She is alert.     Coordination: Coordination normal.     Deep Tendon Reflexes: Reflexes are normal and symmetric. Reflexes normal.  Psychiatric:        Mood and Affect: Mood normal.        Cognition and Memory: Cognition and memory normal.          Assessment & Plan:   Problem List Items Addressed This Visit       Cardiovascular and Mediastinum   Essential hypertension    bp  in fair control at this time  BP Readings from Last 1 Encounters:  11/02/20 134/70  No changes needed Most recent labs reviewed  Disc lifstyle change with low sodium diet and exercise  In light of cough (? From ace) will change benazepril to losartan 50 mg daily and update Continue lasix 40 mg daily with good fluid intake       Relevant Medications   losartan (COZAAR) 50 MG tablet   Other Relevant Orders   Basic metabolic panel     Respiratory   Asthma    occ needs mdi albuterol if she has uri          Genitourinary   E-coli UTI - Primary    Recent uti and sepsis  (proteus also) Reviewed hospital records, lab results and studies in detail   ua some leuk-pend culture Will finish keflex and follow closely  Clinically much improved  Enc good fluid intake        Relevant Orders   Ambulatory referral to  Urology   POCT Urinalysis Dipstick (Automated) (Completed)   Urine Culture   CKD (chronic kidney disease), stage II    Stable Low K last check -re check today (on lasix) Enc good fluid intake       Relevant Orders   Basic metabolic panel   Urine Culture   Kidney stones    Suspect pt passed 2 recently  uti and sepsis as well  Back pain is better  uti symptoms improved Reviewed hospital records, lab results and studies in detail  Ref made for urology to check in         Relevant Orders   Ambulatory referral to Urology   POCT Urinalysis Dipstick (Automated) (Completed)     Other   Cough    Dry nagging cough Poss from ace Nl lung exam  Changed ace to arb today (losartan) inst pt to check in if not improved        RESOLVED: Sepsis ([place])    Recent hospitalization for e coli sepsis  Reviewed hospital records, lab results and studies in detail   Finishing keflex soon  ua -some wbc, pend cx and will tx if pos  Knows what to watch for (malaise, fever) and update  Enc good fluids Rest and adv activity gradually  bmet today       Other Visit Diagnoses     Abnormal urinalysis       Relevant Orders   Urine Culture

## 2020-11-03 LAB — URINE CULTURE
MICRO NUMBER:: 12219557
SPECIMEN QUALITY:: ADEQUATE

## 2020-11-10 ENCOUNTER — Other Ambulatory Visit: Payer: Self-pay

## 2020-11-10 MED ORDER — GLIPIZIDE ER 10 MG PO TB24: 10.0000 mg | ORAL_TABLET | Freq: Every day | ORAL | 0 refills | Status: DC

## 2020-11-10 NOTE — Telephone Encounter (Signed)
Pt said that [facility] cannot get glipizide 10 mg 24 hr tab and is not sure when they will be able to get med. Pt request # [phone] sent to [place]. Pt had annual 07/08/20; advised pt done. Pt will cb if needs further assistance.

## 2020-11-18 ENCOUNTER — Ambulatory Visit: Payer: Medicare Other | Admitting: Urology

## 2020-11-23 ENCOUNTER — Ambulatory Visit: Payer: Medicare Other | Admitting: Urology

## 2020-11-23 ENCOUNTER — Other Ambulatory Visit: Payer: Self-pay

## 2020-11-23 ENCOUNTER — Encounter: Payer: Self-pay | Admitting: Physician Assistant

## 2020-11-23 VITALS — BP 156/89 | HR 83 | Ht 60.0 in | Wt 202.0 lb

## 2020-11-23 DIAGNOSIS — N390 Urinary tract infection, site not specified: Secondary | ICD-10-CM

## 2020-11-23 DIAGNOSIS — N1330 Unspecified hydronephrosis: Secondary | ICD-10-CM | POA: Diagnosis not present

## 2020-11-23 DIAGNOSIS — A419 Sepsis, unspecified organism: Secondary | ICD-10-CM | POA: Diagnosis not present

## 2020-11-23 DIAGNOSIS — N3281 Overactive bladder: Secondary | ICD-10-CM

## 2020-11-23 DIAGNOSIS — N200 Calculus of kidney: Secondary | ICD-10-CM

## 2020-11-23 NOTE — Progress Notes (Signed)
11/23/2020 10:57 AM   Gabriella Howe [birth year] [id number]  CC: Chief Complaint  Patient presents with   Nephrolithiasis   HPI: Gabriella Howe is a 86 y.o. female with PMH nephrolithiasis, recurrent UTI versus Proteus colonization, DM2, OAB wet who previously underwent intravesical Botox, and sleep apnea not on CPAP recently admitted with E. coli urosepsis who presents today for outpatient follow-up.  She was treated with culture appropriate Keflex with follow-up urine cultures growing mixed urogenital flora on 11/02/2020.  Today, she reports no symptoms.  She made the comment that she does not even know why she is here.  Patient denies any modifying or aggravating factors.  Patient denies any gross hematuria, dysuria or suprapubic/flank pain.  Patient denies any fevers, chills, nausea or vomiting.    CT Renal stone study with mild left hydro.    PMH: Past Medical History:  Diagnosis Date   Adenomatous colon polyp 2003   Benign neoplasm of colon    Carpal tunnel syndrome    much better   Chest pain, unspecified    CHF (congestive heart failure) (HCC)    diastolic   Complication of anesthesia    careful in positioning due to back pain   Degeneration of intervertebral disc, site unspecified    Diabetes mellitus    type II   Encounter for long-term (current) use of other medications    Fluid overload    GERD (gastroesophageal reflux disease)    Heart disease, unspecified    History of kidney stones    with stent/hospitalized   Hyperlipidemia    Hypertension    Obesity, unspecified    Rosacea    Shortness of breath    Unspecified asthma(493.90)    Unspecified sleep apnea    no cpap/ sleeps sitting up    Surgical History: Past Surgical History:  Procedure Laterality Date   ABDOMINAL HYSTERECTOMY     partial, fibroids   BOTOX INJECTION N/A 02/09/2020   Procedure: BOTOX INJECTION;  Surgeon: Hollice Espy, MD;  Location: [facility];  Service: Urology;  Laterality:  N/A;   CYSTOSCOPY WITH STENT PLACEMENT Left 02/18/2019   Procedure: CYSTOSCOPY WITH STENT PLACEMENT;  Surgeon: Abbie Sons, MD;  Location: [facility];  Service: Urology;  Laterality: Left;   CYSTOSCOPY/URETEROSCOPY/HOLMIUM LASER/STENT PLACEMENT Left 04/01/2019   Procedure: CYSTOSCOPY/URETEROSCOPY/HOLMIUM LASER/STENT PLACEMENT;  Surgeon: Abbie Sons, MD;  Location: [facility];  Service: Urology;  Laterality: Left;   ESOPHAGOGASTRODUODENOSCOPY  2000   TONSILLECTOMY     UMBILICAL HERNIA REPAIR  01/2004   URETERAL STENT PLACEMENT  2/13   for L sided stone/ ecoli sepsis    Home Medications:  Allergies as of 11/23/2020       Reactions   Rofecoxib    REACTION: reaction not known   Antihistamines, Chlorpheniramine-type Palpitations   Sulfa Antibiotics Rash   Tetanus-diphtheria Toxoids Td Other (See Comments)   REACTION: sick with fever        Medication List        Accurate as of November 23, 2020 10:57 AM. If you have any questions, ask your nurse or doctor.          Accu-Chek Aviva Plus test strip Generic drug: glucose blood USE AS DIRECTED TO CHECK BLOOD SUGAR ONCE DAILY AND AS NEEDED.   Accu-Chek Softclix Lancets lancets USE AS DIRECTED TO CHECK BLOOD SUGAR ONCE DAILY AND AS NEEDED.   B-12 PO Take 1,000 mcg by mouth daily.   BC FAST PAIN RELIEF PO Take  1 packet by mouth daily as needed (headache).   CALCIUM-VITAMIN D PO Take 600 mg by mouth daily.   cholecalciferol 25 MCG (1000 UNIT) tablet Commonly known as: VITAMIN D Take 1,000 Units by mouth daily.   FISH OIL PO Take 1,000 mg by mouth daily.   furosemide 40 MG tablet Commonly known as: LASIX Take 1 tablet (40 mg total) by mouth daily.   glipiZIDE 10 MG 24 hr tablet Commonly known as: GLUCOTROL XL Take 1 tablet (10 mg total) by mouth daily with breakfast.   losartan 50 MG tablet Commonly known as: COZAAR Take 1 tablet (50 mg total) by mouth daily.   metFORMIN 1000 MG tablet Commonly known as:  GLUCOPHAGE Take 1 tablet (1,000 mg total) by mouth 2 (two) times daily with a meal.   multivitamin capsule Take 1 capsule by mouth daily.   naproxen sodium 220 MG tablet Commonly known as: ALEVE Take 220 mg by mouth daily as needed (Pain).   omeprazole 20 MG capsule Commonly known as: PRILOSEC Take 1 capsule (20 mg total) by mouth daily.   simvastatin 40 MG tablet Commonly known as: ZOCOR Take 1 tablet (40 mg total) by mouth at bedtime.   SINUS & ALLERGY 12 HOUR PO Take 1 tablet by mouth daily.   triamcinolone cream 0.1 % Commonly known as: KENALOG APPLY TO AFFECTED AREA(S)  TOPICALLY TWO TIMES A DAY   vitamin C 500 MG tablet Commonly known as: ASCORBIC ACID Take 500 mg by mouth daily.   vitamin E 180 MG (400 UNITS) capsule Take 400 Units by mouth daily.        Allergies:  Allergies  Allergen Reactions   Rofecoxib     REACTION: reaction not known   Antihistamines, Chlorpheniramine-Type Palpitations   Sulfa Antibiotics Rash   Tetanus-Diphtheria Toxoids Td Other (See Comments)    REACTION: sick with fever    Family History: Family History  Problem Relation Age of Onset   Early death Father 75       train accident   Diabetes Mother    Heart disease Mother    Diabetes Sister    Diabetes Brother    Colon cancer Brother    Cancer Paternal Grandmother        unknown   Diabetes Sister     Social History:   reports that she has never smoked. She has never used smokeless tobacco. She reports that she does not drink alcohol and does not use drugs.  Physical Exam: BP (!) 156/89   Pulse 83   Ht 5' (1.524 m)   Wt 202 lb (91.6 kg)   BMI 39.45 kg/m   Constitutional:  Well nourished. Alert and oriented, No acute distress. HEENT: [place] AT, mask in place  Trachea midline Cardiovascular: No clubbing, cyanosis, or edema. Respiratory: Normal respiratory effort, no increased work of breathing. Neurologic: Grossly intact, no focal deficits, moving all 4  extremities. Psychiatric: Normal mood and affect.    Laboratory Data: Lab Results  Component Value Date   WBC 9.3 10/29/2020   HGB 13.6 10/29/2020   HCT 39.3 10/29/2020   MCV 92.3 10/29/2020   PLT 305 10/29/2020    Lab Results  Component Value Date   CREATININE 0.91 11/02/2020    Estimated Creatinine Clearance: 46.4 mL/min (by C-G formula based on SCr of 0.91 mg/dL).  Results for orders placed or performed in visit on 11/02/20  Urine Culture   Specimen: Urine  Result Value Ref Range   MICRO NUMBER:  [id number]    SPECIMEN QUALITY: Adequate    Sample Source NOT GIVEN    STATUS: FINAL    ISOLATE 1:      Mixed genital flora isolated. These superficial bacteria are not indicative of a urinary tract infection. No further organism identification is warranted on this specimen. If clinically indicated, recollect clean-catch, mid-stream urine and transfer  immediately to Urine Culture Transport Tube.   Basic metabolic panel  Result Value Ref Range   Sodium 141 135 - 145 mEq/L   Potassium 3.8 3.5 - 5.1 mEq/L   Chloride 101 96 - 112 mEq/L   CO2 32 19 - 32 mEq/L   Glucose, Bld 191 (H) 70 - 99 mg/dL   BUN 18 6 - 23 mg/dL   Creatinine, Ser 0.91 0.40 - 1.20 mg/dL   GFR 58.15 (L) >60.00 mL/min   Calcium 9.9 8.4 - 10.5 mg/dL  POCT Urinalysis Dipstick (Automated)  Result Value Ref Range   Color, UA Yellow    Clarity, UA Cloudy    Glucose, UA Negative Negative   Bilirubin, UA Negative    Ketones, UA Negative    Spec Grav, UA 1.020 1.010 - 1.025   Blood, UA Negative    pH, UA 6.0 5.0 - 8.0   Protein, UA Positive (A) Negative   Urobilinogen, UA 0.2 0.2 or 1.0 E.U./dL   Nitrite, UA Negative    Leukocytes, UA Moderate (2+) (A) Negative  I have reviewed the labs.     Pertinent Imaging: Results for orders placed during the hospital encounter of 10/23/20  CT Renal Stone Study  Narrative CLINICAL DATA:  The patient has felt ill with some vomiting since 10/19/2020.  EXAM: CT  ABDOMEN AND PELVIS WITHOUT CONTRAST  TECHNIQUE: Multidetector CT imaging of the abdomen and pelvis was performed following the standard protocol without IV contrast.  COMPARISON:  CT abdomen and pelvis 12/02/2019.  FINDINGS: Lower chest: No pleural or pericardial effusion. Lung bases are clear.  Hepatobiliary: Several small stones are seen in the gallbladder but there is no CT evidence of cholecystitis. The liver is low attenuating consistent with fatty infiltration. Biliary tree is negative.  Pancreas: Unremarkable. No pancreatic ductal dilatation or surrounding inflammatory changes.  Spleen: Normal in size without focal abnormality.  Adrenals/Urinary Tract: The adrenal glands are negative. The right kidney appears normal. There is mild left hydronephrosis with some stranding about the left kidney and proximal ureter. 3-4 nonobstructing left renal stones measuring up to 0.6 cm in diameter. No ureteral stone on the right or left. No urinary bladder stones. There is some stranding about the left kidney and proximal ureter. Air is seen in the urinary bladder and presumably due to prior instrumentation.  Stomach/Bowel: Stomach is within normal limits. The appendix is not identified but no evidence of appendicitis is seen. No evidence of bowel wall thickening, distention, or inflammatory changes. Descending and sigmoid colon diverticulosis noted.  Vascular/Lymphatic: Aortic atherosclerosis. No enlarged abdominal or pelvic lymph nodes.  Reproductive: Status post hysterectomy. No adnexal masses.  Other: Postoperative change of umbilical hernia repair noted.  Musculoskeletal: No acute or focal abnormality. Scoliosis and multilevel thoracic and lumbar degenerative change noted.  IMPRESSION: Mild left hydronephrosis stranding about the left kidney and proximal left ureter may be due to recent stone passage. Pyelonephritis is also possible. Negative for ureteral or  urinary bladder stone.  3-4 small nonobstructing left renal stones.  Air in the urinary bladder is presumably due to instrumentation.  Small gallstones without evidence of cholecystitis.  Fatty infiltration of the liver.  Diverticulosis without diverticulitis.  Aortic Atherosclerosis (ICD10-I70.0).   Electronically Signed By: Inge Rise M.D. On: 10/23/2020 15:08 I have independently reviewed the films.  See HPI.     Assessment & Plan:    1. Sepsis due to urinary tract infection ([place]) -resolved  2. Hydronephrosis of left kidney -CT renal stone study from July 2022 notes left-sided hydronephrosis -We will obtain renal ultrasound to ensure this has resolved -RUS pending  3. Nephrolithiasis -Bilateral nephrolithiasis noted on CT renal stone study from July 2022 -Patient currently asymptomatic -RUS pending  4. Urge incontinence -She does not desire another Botox injection at this time as she feels her urinary symptoms have resolved  Return for I will call patient with results.  Zara Council, PA-C  [facility] [address] [place], [place] [address] [phone]

## 2020-12-01 ENCOUNTER — Ambulatory Visit
Admission: RE | Admit: 2020-12-01 | Discharge: 2020-12-01 | Disposition: A | Discharge status: Home / Self Care | Payer: Medicare Other | Source: Ambulatory Visit | Attending: Urology | Admitting: Urology

## 2020-12-01 ENCOUNTER — Other Ambulatory Visit: Payer: Self-pay

## 2020-12-01 DIAGNOSIS — N1330 Unspecified hydronephrosis: Secondary | ICD-10-CM | POA: Diagnosis not present

## 2020-12-01 DIAGNOSIS — N200 Calculus of kidney: Secondary | ICD-10-CM | POA: Diagnosis not present

## 2020-12-01 DIAGNOSIS — N390 Urinary tract infection, site not specified: Secondary | ICD-10-CM | POA: Insufficient documentation

## 2020-12-01 DIAGNOSIS — A419 Sepsis, unspecified organism: Secondary | ICD-10-CM | POA: Diagnosis not present

## 2021-04-06 ENCOUNTER — Telehealth: Payer: Self-pay | Admitting: Family Medicine

## 2021-04-06 DIAGNOSIS — E1122 Type 2 diabetes mellitus with diabetic chronic kidney disease: Secondary | ICD-10-CM

## 2021-04-06 DIAGNOSIS — E1169 Type 2 diabetes mellitus with other specified complication: Secondary | ICD-10-CM

## 2021-04-06 DIAGNOSIS — N182 Chronic kidney disease, stage 2 (mild): Secondary | ICD-10-CM

## 2021-04-06 DIAGNOSIS — E785 Hyperlipidemia, unspecified: Secondary | ICD-10-CM

## 2021-04-06 NOTE — Telephone Encounter (Signed)
-----   Message from Ellamae Sia sent at 03/24/2021  2:56 PM EST ----- Regarding: Lab orders for Thursday, 1.12.23 Lab orders for f/u

## 2021-04-07 ENCOUNTER — Other Ambulatory Visit: Payer: Medicare Other

## 2021-04-14 ENCOUNTER — Ambulatory Visit: Payer: Medicare Other | Admitting: Family Medicine

## 2021-05-05 ENCOUNTER — Other Ambulatory Visit: Payer: Self-pay

## 2021-05-05 ENCOUNTER — Other Ambulatory Visit (INDEPENDENT_AMBULATORY_CARE_PROVIDER_SITE_OTHER): Payer: Medicare Other

## 2021-05-05 DIAGNOSIS — E785 Hyperlipidemia, unspecified: Secondary | ICD-10-CM | POA: Diagnosis not present

## 2021-05-05 DIAGNOSIS — N182 Chronic kidney disease, stage 2 (mild): Secondary | ICD-10-CM | POA: Diagnosis not present

## 2021-05-05 DIAGNOSIS — E1169 Type 2 diabetes mellitus with other specified complication: Secondary | ICD-10-CM

## 2021-05-05 DIAGNOSIS — E1122 Type 2 diabetes mellitus with diabetic chronic kidney disease: Secondary | ICD-10-CM

## 2021-05-05 LAB — COMPREHENSIVE METABOLIC PANEL
ALT: 13 U/L (ref 0–35)
AST: 15 U/L (ref 0–37)
Albumin: 4 g/dL (ref 3.5–5.2)
Alkaline Phosphatase: 74 U/L (ref 39–117)
BUN: 17 mg/dL (ref 6–23)
CO2: 29 mEq/L (ref 19–32)
Calcium: 9.6 mg/dL (ref 8.4–10.5)
Chloride: 107 mEq/L (ref 96–112)
Creatinine, Ser: 0.91 mg/dL (ref 0.40–1.20)
GFR: 57.94 mL/min — ABNORMAL LOW (ref >60.00)
Glucose, Bld: 168 mg/dL — ABNORMAL HIGH (ref 70–99)
Potassium: 4 mEq/L (ref 3.5–5.1)
Sodium: 144 mEq/L (ref 135–145)
Total Bilirubin: 1.1 mg/dL (ref 0.2–1.2)
Total Protein: 6.7 g/dL (ref 6.0–8.3)

## 2021-05-05 LAB — LIPID PANEL
Cholesterol: 118 mg/dL (ref 0–200)
HDL: 48.8 mg/dL (ref >39.00)
LDL Cholesterol: 37 mg/dL (ref 0–99)
NonHDL: 69.24
Total CHOL/HDL Ratio: 2
Triglycerides: 160 mg/dL — ABNORMAL HIGH (ref 0.0–149.0)
VLDL: 32 mg/dL (ref 0.0–40.0)

## 2021-05-05 LAB — HEMOGLOBIN A1C: Hgb A1c MFr Bld: 8.1 % — ABNORMAL HIGH (ref 4.6–6.5)

## 2021-05-12 ENCOUNTER — Ambulatory Visit (INDEPENDENT_AMBULATORY_CARE_PROVIDER_SITE_OTHER): Payer: Medicare Other | Admitting: Family Medicine

## 2021-05-12 ENCOUNTER — Other Ambulatory Visit: Payer: Self-pay

## 2021-05-12 ENCOUNTER — Encounter: Payer: Self-pay | Admitting: Family Medicine

## 2021-05-12 VITALS — BP 120/86 | HR 80 | Temp 97.9°F | Ht 60.0 in | Wt 202.0 lb

## 2021-05-12 DIAGNOSIS — E1122 Type 2 diabetes mellitus with diabetic chronic kidney disease: Secondary | ICD-10-CM | POA: Diagnosis not present

## 2021-05-12 DIAGNOSIS — E1169 Type 2 diabetes mellitus with other specified complication: Secondary | ICD-10-CM | POA: Diagnosis not present

## 2021-05-12 DIAGNOSIS — R21 Rash and other nonspecific skin eruption: Secondary | ICD-10-CM | POA: Diagnosis not present

## 2021-05-12 DIAGNOSIS — I10 Essential (primary) hypertension: Secondary | ICD-10-CM

## 2021-05-12 DIAGNOSIS — E785 Hyperlipidemia, unspecified: Secondary | ICD-10-CM

## 2021-05-12 MED ORDER — TRIAMCINOLONE ACETONIDE 0.1 % EX CREA: TOPICAL_CREAM | CUTANEOUS | 1 refills | Status: DC

## 2021-05-12 MED ORDER — VITAMIN D3 50 MCG (2000 UT) PO CAPS: 2000.0000 [IU] | ORAL_CAPSULE | Freq: Every day | ORAL | 3 refills | Status: AC

## 2021-05-12 NOTE — Assessment & Plan Note (Signed)
Good control with simvastatin 40 mg daily  Disc goals for lipids and reasons to control them Rev last labs with pt Rev low sat fat diet in detail LDL of 37 Trig up a bit with higher sugar level

## 2021-05-12 NOTE — Patient Instructions (Addendum)
Chair yoga is helpful  Go on you tube  You can also get videos for TV  Also work on using the pedaler machine  Work up to at least 30 minutes per day   Please try to cut back on the sweets  Less artificial sweetener may help  Try to get most of your carbohydrates from produce (with the exception of white potatoes)  Eat less bread/pasta/rice/snack foods/cereals/sweets and other items from the middle of the grocery store (processed carbs)  Make sure you get enough protein   Start checking glucose levels Am fasting, then 2 hours after later meal (different meals)   Start vitamin D3 2000 iu daily I sent to your pharmacy   We will see you in May as planned

## 2021-05-12 NOTE — Assessment & Plan Note (Signed)
bp in fair control at this time  BP Readings from Last 1 Encounters:  05/12/21 120/86   No changes needed Most recent labs reviewed  Disc lifstyle change with low sodium diet and exercise  Plan to continue losartan 50 mg daily and lasix 40 mg daily

## 2021-05-12 NOTE — Assessment & Plan Note (Addendum)
Lab Results  Component Value Date   HGBA1C 8.1 (H) 05/05/2021   This is up significantly  Per pt eating daily sweets and more carbs  Disc low glycemic diet  Disc plan for 30 min of exercise daily and better water intake  Will start checking glucose bid and keep a log Taking arb and statin  Follow up in May  If not improved may need to add or change tx

## 2021-05-12 NOTE — Progress Notes (Signed)
Subjective:    Patient ID: Gabriella Howe, female    DOB: 08/15/1934, 87 y.o.   MRN: [id number]  This visit occurred during the SARS-CoV-2 public health emergency.  Safety protocols were in place, including screening questions prior to the visit, additional usage of staff PPE, and extensive cleaning of exam room while observing appropriate contact time as indicated for disinfecting solutions.   HPI Pt presents for DM f/u  Wt Readings from Last 3 Encounters:  05/12/21 202 lb (91.6 kg)  11/23/20 202 lb (91.6 kg)  11/02/20 203 lb 1 oz (92.1 kg)   39.45 kg/m  Doing ok  Nothing new  Still working - does more by phone and mail now   Not enough walking- getting harder  Balance is not good  Has a floor pedaler and needs to use it more   bp is stable today  No cp or palpitations or headaches or edema  No side effects to medicines  BP Readings from Last 3 Encounters:  05/12/21 120/86  11/23/20 (!) 156/89  11/02/20 134/70       DM2 Lab Results  Component Value Date   HGBA1C 8.1 (H) 05/05/2021   This is up from 6.7  Has not been checking glucose levels   Does not eat quite as much  Not as hungry and less taste with age  It is hard to prep food for one   More sugar and carbs at xmas/sweets  Now has sweets almost every day with some coffee at night   Cookies/banana or crackers -needs to get away from that pm habit   Drinks diet green tea  Coffee with sweet and low  Dislikes water in general    Metformin 1000 mg bid Glipizide 10 mg xl daily   Arb  Statin  Eye exam -per pt utd  Had hosp in aug for urosepsis  Hyperlipidemia Lab Results  Component Value Date   CHOL 118 05/05/2021   CHOL 105 10/24/2020   CHOL 121 07/01/2020   Lab Results  Component Value Date   HDL 48.80 05/05/2021   HDL 34 (L) 10/24/2020   HDL 53.00 07/01/2020   Lab Results  Component Value Date   LDLCALC 37 05/05/2021   LDLCALC 51 10/24/2020   LDLCALC 42 07/01/2020   Lab Results   Component Value Date   TRIG 160.0 (H) 05/05/2021   TRIG 100 10/24/2020   TRIG 128.0 07/01/2020   Lab Results  Component Value Date   CHOLHDL 2 05/05/2021   CHOLHDL 3.1 10/24/2020   CHOLHDL 2 07/01/2020   No results found for: LDLDIRECT Simvastatin 40 daily   Patient Active Problem List   Diagnosis Date Noted   Cough 11/02/2020   Kidney stones 11/02/2020   Asthma 10/23/2020   CKD (chronic kidney disease), stage II 10/23/2020   E-coli UTI 09/03/2019   Pedal edema 08/05/2019   DJD (degenerative joint disease) 02/26/2019   Iron deficiency anemia 04/30/2017   History of shingles 10/03/2016   Routine general medical examination at a health care facility 04/14/2015   Knee pain 07/28/2014   Incomplete emptying of bladder 11/28/2012   Frequency of urination 11/28/2012   Encounter for Medicare annual wellness exam 11/19/2012   History of kidney stones 60/73/7106   DIASTOLIC HEART FAILURE, CHRONIC 07/29/2009   Hyperlipidemia associated with type 2 diabetes mellitus ([place]) 09/13/2006   Morbid obesity ([place]) 09/10/2006   CARPAL TUNNEL SYNDROME 09/10/2006   Essential hypertension 26/94/8546   DIASTOLIC DYSFUNCTION 27/05/5007  REACTIVE AIRWAY DISEASE 09/10/2006   GERD 09/10/2006   ROSACEA 09/10/2006   Degeneration of lumbar or lumbosacral intervertebral disc 09/10/2006   OSA (obstructive sleep apnea) 09/10/2006   COLONIC POLYPS, HX OF 09/10/2006   Type II diabetes mellitus with renal manifestations ([place]) 09/06/2006   Past Medical History:  Diagnosis Date   Adenomatous colon polyp 2003   Benign neoplasm of colon    Carpal tunnel syndrome    much better   Chest pain, unspecified    CHF (congestive heart failure) (HCC)    diastolic   Complication of anesthesia    careful in positioning due to back pain   Degeneration of intervertebral disc, site unspecified    Diabetes mellitus    type II   Encounter for long-term (current) use of other medications    Fluid overload     GERD (gastroesophageal reflux disease)    Heart disease, unspecified    History of kidney stones    with stent/hospitalized   Hyperlipidemia    Hypertension    Obesity, unspecified    Rosacea    Shortness of breath    Unspecified asthma(493.90)    Unspecified sleep apnea    no cpap/ sleeps sitting up   Past Surgical History:  Procedure Laterality Date   ABDOMINAL HYSTERECTOMY     partial, fibroids   BOTOX INJECTION N/A 02/09/2020   Procedure: BOTOX INJECTION;  Surgeon: Hollice Espy, MD;  Location: [facility];  Service: Urology;  Laterality: N/A;   CYSTOSCOPY WITH STENT PLACEMENT Left 02/18/2019   Procedure: CYSTOSCOPY WITH STENT PLACEMENT;  Surgeon: Abbie Sons, MD;  Location: [facility];  Service: Urology;  Laterality: Left;   CYSTOSCOPY/URETEROSCOPY/HOLMIUM LASER/STENT PLACEMENT Left 04/01/2019   Procedure: CYSTOSCOPY/URETEROSCOPY/HOLMIUM LASER/STENT PLACEMENT;  Surgeon: Abbie Sons, MD;  Location: [facility];  Service: Urology;  Laterality: Left;   ESOPHAGOGASTRODUODENOSCOPY  2000   TONSILLECTOMY     UMBILICAL HERNIA REPAIR  01/2004   URETERAL STENT PLACEMENT  2/13   for L sided stone/ ecoli sepsis   Social History   Tobacco Use   Smoking status: Never    Passive exposure: Past   Smokeless tobacco: Never  Vaping Use   Vaping Use: Never used  Substance Use Topics   Alcohol use: No    Alcohol/week: 0.0 standard drinks   Drug use: No   Family History  Problem Relation Age of Onset   Early death Father 49       train accident   Diabetes Mother    Heart disease Mother    Diabetes Sister    Diabetes Brother    Colon cancer Brother    Cancer Paternal Grandmother        unknown   Diabetes Sister    Allergies  Allergen Reactions   Rofecoxib     REACTION: reaction not known   Antihistamines, Chlorpheniramine-Type Palpitations   Sulfa Antibiotics Rash   Tetanus-Diphtheria Toxoids Td Other (See Comments)    REACTION: sick with fever   Current Outpatient  Medications on File Prior to Visit  Medication Sig Dispense Refill   Accu-Chek Softclix Lancets lancets USE AS DIRECTED TO CHECK BLOOD SUGAR ONCE DAILY AND AS NEEDED. 100 each 3   Aspirin-Caffeine (BC FAST PAIN RELIEF PO) Take 1 packet by mouth daily as needed (headache).     furosemide (LASIX) 40 MG tablet Take 1 tablet (40 mg total) by mouth daily. 90 tablet 3   glipiZIDE (GLUCOTROL XL) 10 MG 24 hr tablet Take 1 tablet (  10 mg total) by mouth daily with breakfast. 90 tablet 0   glucose blood (ACCU-CHEK AVIVA PLUS) test strip USE AS DIRECTED TO CHECK BLOOD SUGAR ONCE DAILY AND AS NEEDED. 90 each 3   losartan (COZAAR) 50 MG tablet Take 1 tablet (50 mg total) by mouth daily. 90 tablet 3   metFORMIN (GLUCOPHAGE) 1000 MG tablet Take 1 tablet (1,000 mg total) by mouth 2 (two) times daily with a meal. 180 tablet 3   naproxen sodium (ALEVE) 220 MG tablet Take 220 mg by mouth daily as needed (Pain).      omeprazole (PRILOSEC) 20 MG capsule Take 1 capsule (20 mg total) by mouth daily. 90 capsule 3   Pseudoephedrine HCl (SINUS & ALLERGY 12 HOUR PO) Take 1 tablet by mouth daily.      simvastatin (ZOCOR) 40 MG tablet Take 1 tablet (40 mg total) by mouth at bedtime. 90 tablet 3   No current facility-administered medications on file prior to visit.     Review of Systems  Constitutional:  Positive for fatigue. Negative for activity change, appetite change, fever and unexpected weight change.  HENT:  Negative for congestion, ear pain, rhinorrhea, sinus pressure and sore throat.   Eyes:  Negative for pain, redness and visual disturbance.  Respiratory:  Negative for cough, shortness of breath and wheezing.   Cardiovascular:  Negative for chest pain and palpitations.  Gastrointestinal:  Negative for abdominal pain, blood in stool, constipation and diarrhea.  Endocrine: Negative for polydipsia and polyuria.  Genitourinary:  Negative for dysuria, frequency and urgency.  Musculoskeletal:  Positive for  arthralgias and back pain. Negative for myalgias.  Skin:  Negative for pallor and rash.  Allergic/Immunologic: Negative for environmental allergies.  Neurological:  Negative for dizziness, syncope and headaches.  Hematological:  Negative for adenopathy. Does not bruise/bleed easily.  Psychiatric/Behavioral:  Negative for decreased concentration and dysphoric mood. The patient is not nervous/anxious.       Objective:   Physical Exam Constitutional:      General: She is not in acute distress.    Appearance: Normal appearance. She is well-developed. She is obese. She is not ill-appearing or diaphoretic.  HENT:     Head: Normocephalic and atraumatic.  Eyes:     Conjunctiva/sclera: Conjunctivae normal.     Pupils: Pupils are equal, round, and reactive to light.  Neck:     Thyroid: No thyromegaly.     Vascular: No carotid bruit or JVD.  Cardiovascular:     Rate and Rhythm: Normal rate and regular rhythm.     Heart sounds: Normal heart sounds.    No gallop.  Pulmonary:     Effort: Pulmonary effort is normal. No respiratory distress.     Breath sounds: Normal breath sounds. No wheezing or rales.  Abdominal:     General: Bowel sounds are normal. There is no distension or abdominal bruit.     Palpations: Abdomen is soft. There is no mass.     Tenderness: There is no abdominal tenderness.  Musculoskeletal:     Cervical back: Normal range of motion and neck supple.     Right lower leg: No edema.     Left lower leg: No edema.  Lymphadenopathy:     Cervical: No cervical adenopathy.  Skin:    General: Skin is warm and dry.     Coloration: Skin is not pale.     Findings: No rash.  Neurological:     Mental Status: She is alert.  Coordination: Coordination normal.     Deep Tendon Reflexes: Reflexes are normal and symmetric. Reflexes normal.  Psychiatric:        Mood and Affect: Mood normal.          Assessment & Plan:   Problem List Items Addressed This Visit        Cardiovascular and Mediastinum   Essential hypertension    bp in fair control at this time  BP Readings from Last 1 Encounters:  05/12/21 120/86  No changes needed Most recent labs reviewed  Disc lifstyle change with low sodium diet and exercise  Plan to continue losartan 50 mg daily and lasix 40 mg daily        Endocrine   Hyperlipidemia associated with type 2 diabetes mellitus ([place])    Good control with simvastatin 40 mg daily  Disc goals for lipids and reasons to control them Rev last labs with pt Rev low sat fat diet in detail LDL of 37 Trig up a bit with higher sugar level       Type II diabetes mellitus with renal manifestations ([place]) - Primary    Lab Results  Component Value Date   HGBA1C 8.1 (H) 05/05/2021  This is up significantly  Per pt eating daily sweets and more carbs  Disc low glycemic diet  Disc plan for 30 min of exercise daily and better water intake  Will start checking glucose bid and keep a log Taking arb and statin  Follow up in May  If not improved may need to add or change tx      Other Visit Diagnoses     Rash and nonspecific skin eruption       Relevant Medications   triamcinolone cream (KENALOG) 0.1 %

## 2021-07-16 IMAGING — CT CT RENAL STONE PROTOCOL
2 of 4 series · 15 of 46 positions shown, 17 images · non-contrast
Comparison: December 19, 2012

CLINICAL DATA: Left flank pain

EXAM:
CT ABDOMEN AND PELVIS WITHOUT CONTRAST
TECHNIQUE: Multidetector CT imaging of the abdomen and pelvis was performed
following the standard protocol without oral or IV contrast.

[Series 2: stone full standard · axial · 0.77mm/px · z∈[-952,-627]mm · 12 of 77 slices shown, 14 images]
[im 6/77  soft-tissue]
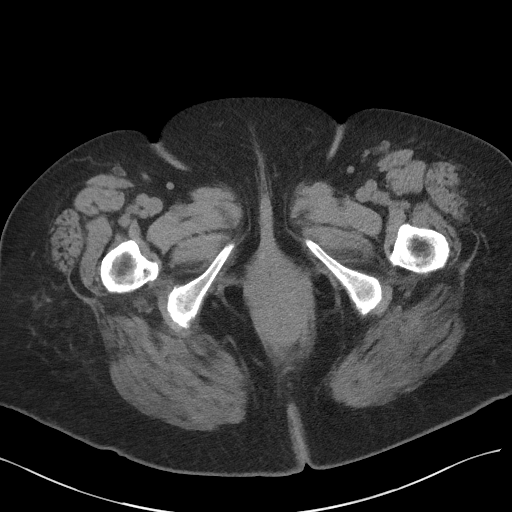
[im 6/77  bone]
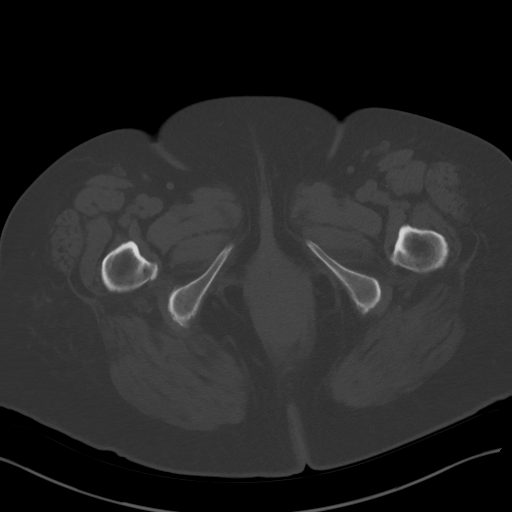
[im 12/77  soft-tissue]
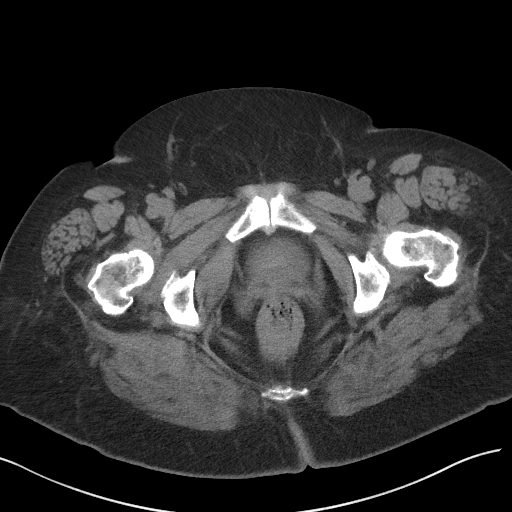
[im 18/77  soft-tissue]
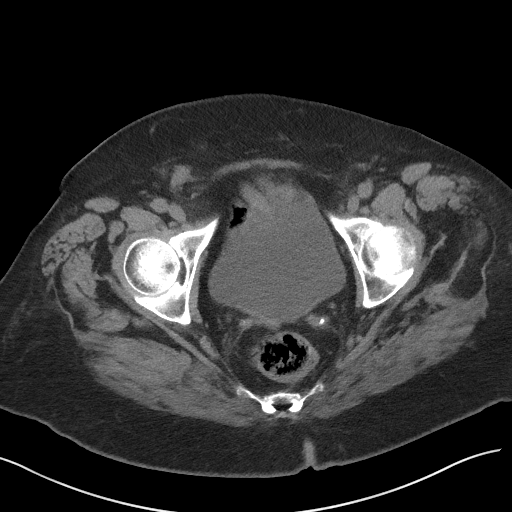
[im 24/77  soft-tissue]
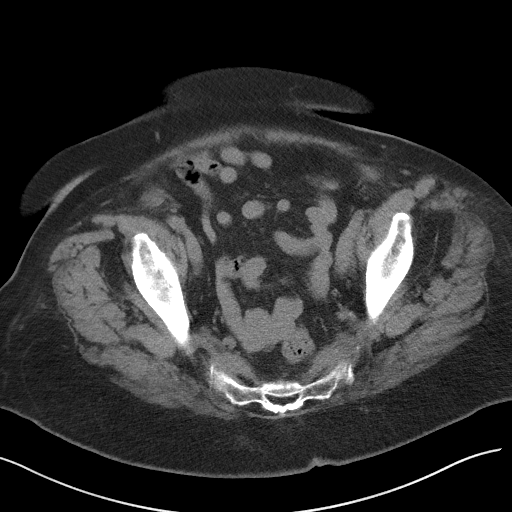
[im 30/77  soft-tissue]
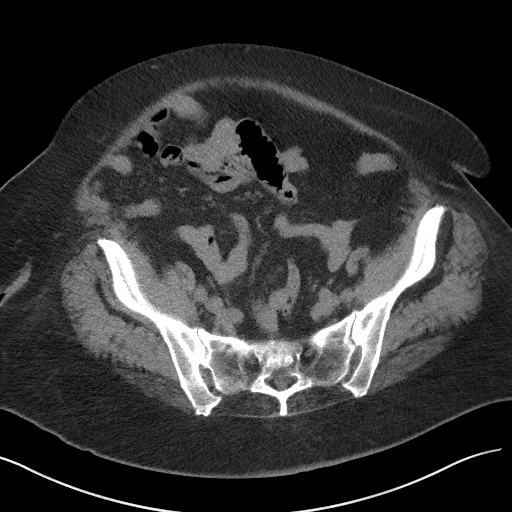
[im 36/77  soft-tissue]
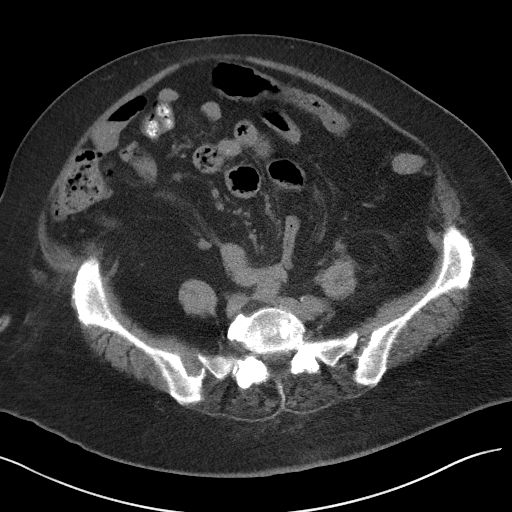
[im 41/77  soft-tissue]
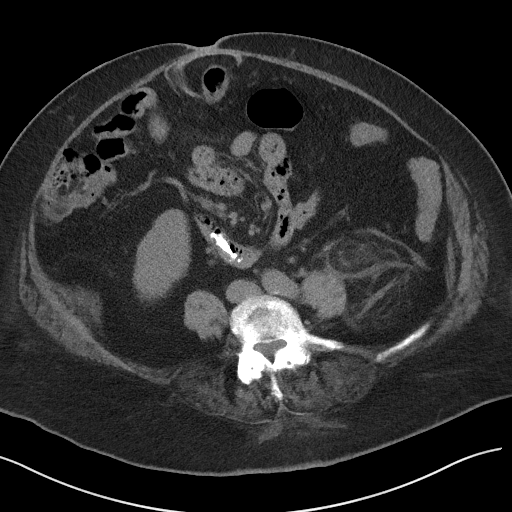
[im 47/77  soft-tissue]
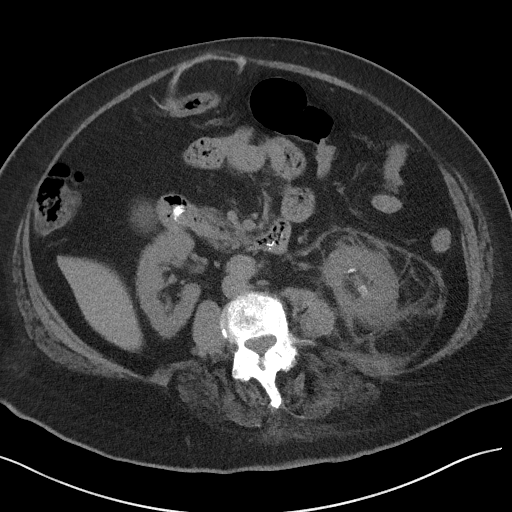
[im 53/77  soft-tissue]
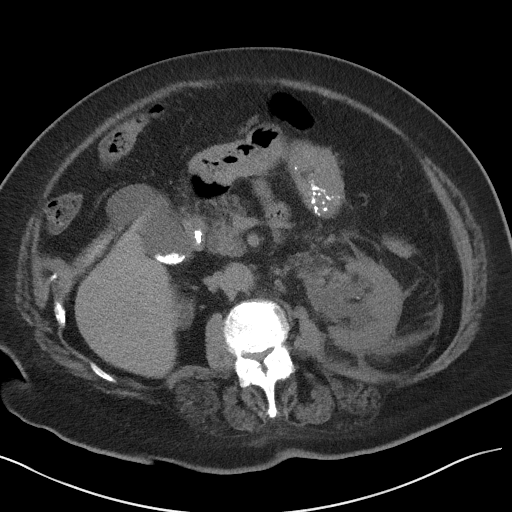
[im 53/77  bone]
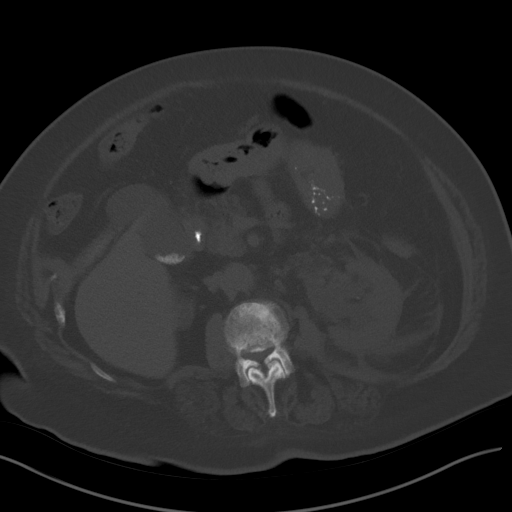
[im 59/77  soft-tissue]
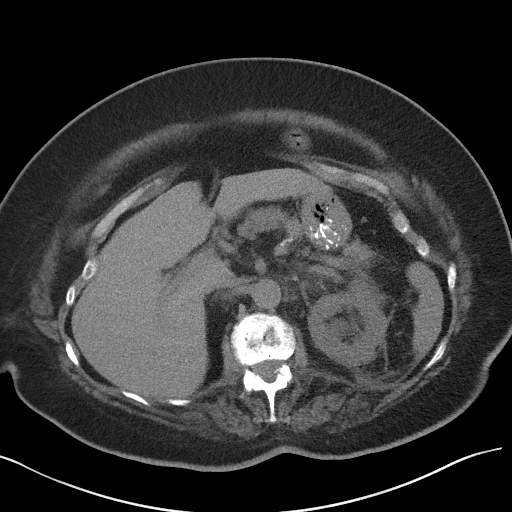
[im 65/77  soft-tissue]
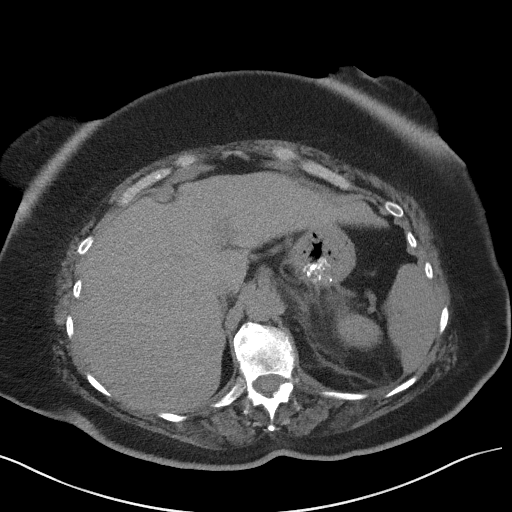
[im 71/77  soft-tissue]
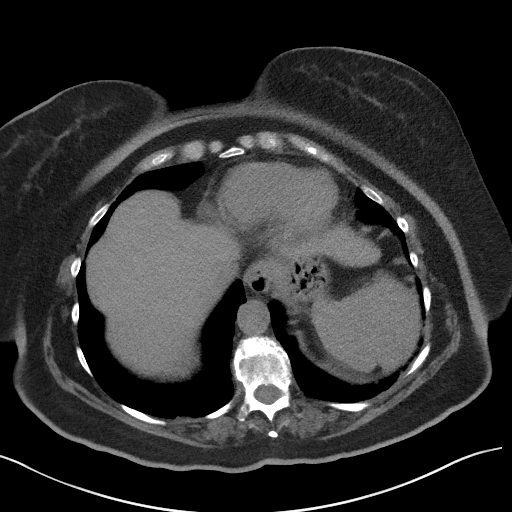

[Series 5: coronal · coronal · 0.76mm/px · 3 of 172 slices shown]
[im 58/172  soft-tissue]
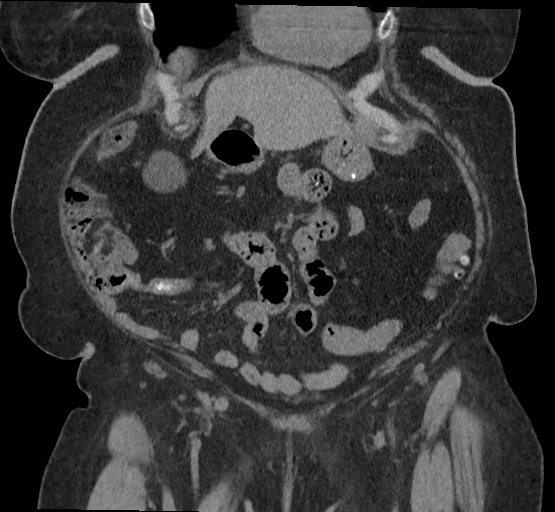
[im 77/172  soft-tissue]
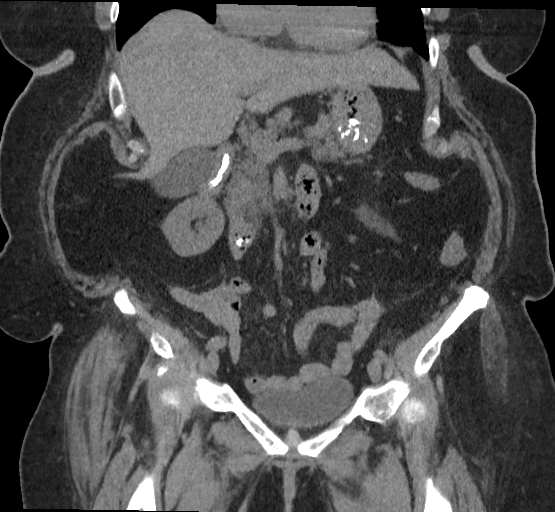
[im 96/172  soft-tissue]
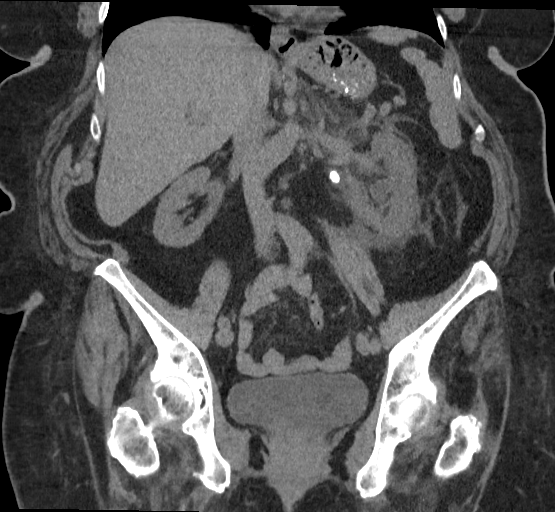

[15 of 46 positions shown; findings below may reference images not displayed]

FINDINGS: Lower chest: There is mild scarring in the lung bases. There is no
lung base edema or consolidation. There is a small hiatal hernia.

Hepatobiliary: No focal liver lesions are evident on this
noncontrast enhanced study. There is cholelithiasis. There is no
appreciable gallbladder wall thickening. There is no evident biliary
duct dilatation.

Pancreas: There is no pancreatic mass or inflammatory focus.

Spleen: No splenic lesions are evident. A small splenule is noted
anterior to the spleen.

Adrenals/Urinary Tract: Adrenals bilaterally appear unremarkable.
Left kidney is edematous. There is extensive soft tissue stranding
and fluid in the perinephric fascia on the left. There is a 5 mm
cyst arising from the lateral mid to lower right kidney. There is
severe hydronephrosis on the left. There is no appreciable
hydronephrosis on the right. No calculi evident in the right kidney.
There is a calculus in the lower pole calyx region on the right
measuring 8 x 8 mm. A nearby second calculus in this area measures 9
x 7 mm. There is a 1 mm calculus in the mid left kidney. There is a
2 mm calculus more inferiorly in the lower pole left kidney. There
is a 3 x 2 mm calculus in the left renal pelvis. There is a calculus
at the left ureteropelvic junction measuring 1.0 x 0.9 cm. No other
ureteral calculi are evident on either side. Urinary bladder is
midline with wall thickness within normal limits.

Stomach/Bowel: There is no appreciable bowel wall or mesenteric
thickening. There is no appreciable bowel obstruction. The terminal
ileum appears normal. There is no evident free air or portal venous
air.

Vascular/Lymphatic: There is no abdominal aortic aneurysm. Aorta is
tortuous. There is mild calcification in the aorta and common iliac
arteries. There is no appreciable adenopathy in the abdomen or
pelvis.

Reproductive: Uterus is absent. There is no pelvic mass evident.

Other: There is no appendiceal region inflammation. There is no
abscess or ascites in the abdomen or pelvis.

Musculoskeletal: There is extensive degenerative type change
throughout lower thoracic and lumbar regions. There is moderately
severe spinal stenosis at L4-5 due to diffuse bony overgrowth. No
blastic or lytic bone lesions are evident. There is fatty
infiltration in the lateral aspect of the left tensor fascia lata
muscle. No abdominal wall lesions are evident.
IMPRESSION: 1. There is severe hydronephrosis on the left with extensive soft
tissue stranding and fluid in the perinephric fascia on the left.
There is a calculus at the left ureteropelvic junction measuring
x 0.9 cm. Multiple calculi throughout the left kidney, primarily in
the left lower pole region.
2. Cholelithiasis.  No appreciable gallbladder wall thickening
3. Small hiatal hernia.
4. Aortic atherosclerosis.
5. Severe degenerative type change throughout the lower thoracic and
lumbar spine. There is moderately severe spinal stenosis at L4-5 due
to diffuse bony overgrowth.
6. No bowel obstruction. No abscess in the abdomen or pelvis. No
periappendiceal region inflammation.

Aortic Atherosclerosis ([id number]-CCQ.Q).

## 2021-07-20 ENCOUNTER — Other Ambulatory Visit: Payer: Self-pay | Admitting: Family Medicine

## 2021-07-27 ENCOUNTER — Other Ambulatory Visit: Payer: Self-pay | Admitting: Family Medicine

## 2021-08-04 ENCOUNTER — Ambulatory Visit (INDEPENDENT_AMBULATORY_CARE_PROVIDER_SITE_OTHER): Payer: Medicare Other

## 2021-08-04 VITALS — Wt 202.0 lb

## 2021-08-04 DIAGNOSIS — Z0000 Encounter for general adult medical examination without abnormal findings: Secondary | ICD-10-CM | POA: Diagnosis not present

## 2021-08-04 NOTE — Patient Instructions (Signed)
Gabriella Howe , ?Thank you for taking time to come for your Medicare Wellness Visit. I appreciate your ongoing commitment to your health goals. Please review the following plan we discussed and let me know if I can assist you in the future.  ? ?Screening recommendations/referrals: ?Colonoscopy: aged out ?Mammogram: aged out ?Bone Density: n/d ?Recommended yearly ophthalmology/optometry visit for glaucoma screening and checkup ?Recommended yearly dental visit for hygiene and checkup ? ?Vaccinations: ?Influenza vaccine: n/d ?Pneumococcal vaccine: 11/28/13 ?Tdap vaccine: n/d ?Shingles vaccine: n/d   ?Covid-19:05/02/19, 05/23/19, 03/16/20 ? ?Advanced directives: no ? ?Conditions/risks identified: none ? ?Next appointment: Follow up in one year for your annual wellness visit 08/08/22 @ 11:30am by phone ? ? ?Preventive Care 85 Years and Older, Female ?Preventive care refers to lifestyle choices and visits with your health care provider that can promote health and wellness. ?What does preventive care include? ?A yearly physical exam. This is also called an annual well check. ?Dental exams once or twice a year. ?Routine eye exams. Ask your health care provider how often you should have your eyes checked. ?Personal lifestyle choices, including: ?Daily care of your teeth and gums. ?Regular physical activity. ?Eating a healthy diet. ?Avoiding tobacco and drug use. ?Limiting alcohol use. ?Practicing safe sex. ?Taking low-dose aspirin every day. ?Taking vitamin and mineral supplements as recommended by your health care provider. ?What happens during an annual well check? ?The services and screenings done by your health care provider during your annual well check will depend on your age, overall health, lifestyle risk factors, and family history of disease. ?Counseling  ?Your health care provider may ask you questions about your: ?Alcohol use. ?Tobacco use. ?Drug use. ?Emotional well-being. ?Home and relationship well-being. ?Sexual  activity. ?Eating habits. ?History of falls. ?Memory and ability to understand (cognition). ?Work and work Statistician. ?Reproductive health. ?Screening  ?You may have the following tests or measurements: ?Height, weight, and BMI. ?Blood pressure. ?Lipid and cholesterol levels. These may be checked every 5 years, or more frequently if you are over 83 years old. ?Skin check. ?Lung cancer screening. You may have this screening every year starting at age 8 if you have a 30-pack-year history of smoking and currently smoke or have quit within the past 15 years. ?Fecal occult blood test (FOBT) of the stool. You may have this test every year starting at age 69. ?Flexible sigmoidoscopy or colonoscopy. You may have a sigmoidoscopy every 5 years or a colonoscopy every 10 years starting at age 22. ?Hepatitis C blood test. ?Hepatitis B blood test. ?Sexually transmitted disease (STD) testing. ?Diabetes screening. This is done by checking your blood sugar (glucose) after you have not eaten for a while (fasting). You may have this done every 1-3 years. ?Bone density scan. This is done to screen for osteoporosis. You may have this done starting at age 81. ?Mammogram. This may be done every 1-2 years. Talk to your health care provider about how often you should have regular mammograms. ?Talk with your health care provider about your test results, treatment options, and if necessary, the need for more tests. ?Vaccines  ?Your health care provider may recommend certain vaccines, such as: ?Influenza vaccine. This is recommended every year. ?Tetanus, diphtheria, and acellular pertussis (Tdap, Td) vaccine. You may need a Td booster every 10 years. ?Zoster vaccine. You may need this after age 49. ?Pneumococcal 13-valent conjugate (PCV13) vaccine. One dose is recommended after age 32. ?Pneumococcal polysaccharide (PPSV23) vaccine. One dose is recommended after age 41. ?Talk to your health  care provider about which screenings and vaccines  you need and how often you need them. ?This information is not intended to replace advice given to you by your health care provider. Make sure you discuss any questions you have with your health care provider. ?Document Released: 04/09/2015 Document Revised: 12/01/2015 Document Reviewed: 01/12/2015 ?Elsevier Interactive Patient Education ? 2017 [place]. ? ?Fall Prevention in the Home ?Falls can cause injuries. They can happen to people of all ages. There are many things you can do to make your home safe and to help prevent falls. ?What can I do on the outside of my home? ?Regularly fix the edges of walkways and driveways and fix any cracks. ?Remove anything that might make you trip as you walk through a door, such as a raised step or threshold. ?Trim any bushes or trees on the path to your home. ?Use bright outdoor lighting. ?Clear any walking paths of anything that might make someone trip, such as rocks or tools. ?Regularly check to see if handrails are loose or broken. Make sure that both sides of any steps have handrails. ?Any raised decks and porches should have guardrails on the edges. ?Have any leaves, snow, or ice cleared regularly. ?Use sand or salt on walking paths during winter. ?Clean up any spills in your garage right away. This includes oil or grease spills. ?What can I do in the bathroom? ?Use night lights. ?Install grab bars by the toilet and in the tub and shower. Do not use towel bars as grab bars. ?Use non-skid mats or decals in the tub or shower. ?If you need to sit down in the shower, use a plastic, non-slip stool. ?Keep the floor dry. Clean up any water that spills on the floor as soon as it happens. ?Remove soap buildup in the tub or shower regularly. ?Attach bath mats securely with double-sided non-slip rug tape. ?Do not have throw rugs and other things on the floor that can make you trip. ?What can I do in the bedroom? ?Use night lights. ?Make sure that you have a light by your bed that  is easy to reach. ?Do not use any sheets or blankets that are too big for your bed. They should not hang down onto the floor. ?Have a firm chair that has side arms. You can use this for support while you get dressed. ?Do not have throw rugs and other things on the floor that can make you trip. ?What can I do in the kitchen? ?Clean up any spills right away. ?Avoid walking on wet floors. ?Keep items that you use a lot in easy-to-reach places. ?If you need to reach something above you, use a strong step stool that has a grab bar. ?Keep electrical cords out of the way. ?Do not use floor polish or wax that makes floors slippery. If you must use wax, use non-skid floor wax. ?Do not have throw rugs and other things on the floor that can make you trip. ?What can I do with my stairs? ?Do not leave any items on the stairs. ?Make sure that there are handrails on both sides of the stairs and use them. Fix handrails that are broken or loose. Make sure that handrails are as long as the stairways. ?Check any carpeting to make sure that it is firmly attached to the stairs. Fix any carpet that is loose or worn. ?Avoid having throw rugs at the top or bottom of the stairs. If you do have throw rugs, attach them to  the floor with carpet tape. ?Make sure that you have a light switch at the top of the stairs and the bottom of the stairs. If you do not have them, ask someone to add them for you. ?What else can I do to help prevent falls? ?Wear shoes that: ?Do not have high heels. ?Have rubber bottoms. ?Are comfortable and fit you well. ?Are closed at the toe. Do not wear sandals. ?If you use a stepladder: ?Make sure that it is fully opened. Do not climb a closed stepladder. ?Make sure that both sides of the stepladder are locked into place. ?Ask someone to hold it for you, if possible. ?Clearly mark and make sure that you can see: ?Any grab bars or handrails. ?First and last steps. ?Where the edge of each step is. ?Use tools that help you  move around (mobility aids) if they are needed. These include: ?Canes. ?Walkers. ?Scooters. ?Crutches. ?Turn on the lights when you go into a dark area. Replace any light bulbs as soon as they burn out. ?

## 2021-08-04 NOTE — Progress Notes (Signed)
?.Virtual Visit via Telephone Note ? ?I connected with  Gabriella Howe on 08/04/21 at 11:30 AM EDT by telephone and verified that I am speaking with the correct person using two identifiers. ? ?Location: ?Patient: home ?Provider: [place] ?Persons participating in the virtual visit: patient/Nurse Health Advisor ?  ?I discussed the limitations, risks, security and privacy concerns of performing an evaluation and management service by telephone and the availability of in person appointments. The patient expressed understanding and agreed to proceed. ? ?Interactive audio and video telecommunications were attempted between this nurse and patient, however failed, due to patient having technical difficulties OR patient did not have access to video capability.  We continued and completed visit with audio only. ? ?Some vital signs may be absent or patient reported.  ? ?Dionisio David, LPN ? ?Subjective:  ? Gabriella Howe is a 87 y.o. female who presents for Medicare Annual (Subsequent) preventive examination. ? ?Review of Systems    ? ?  ? ?   ?Objective:  ?  ?There were no vitals filed for this visit. ?There is no height or weight on file to calculate BMI. ? ? ?  10/23/2020  ? 11:31 PM 10/23/2020  ? 11:29 PM 10/23/2020  ? 11:31 AM 08/03/2020  ? 12:01 PM 02/09/2020  ?  6:49 AM 01/30/2020  ? 11:37 AM 05/08/2019  ? 10:33 AM  ?Advanced Directives  ?Does Patient Have a Medical Advance Directive? Yes Yes No Yes Yes Yes Yes  ?Type of Paramedic of [place];Living will [place];Living will [place];Living will [place];Living will  ?Does patient want to make changes to medical advance directive? No - Patient declined No - Patient declined   No - Patient declined No - Patient declined   ?Copy of [place] in Chart? Yes - validated most recent copy scanned in chart (See row information)   No -  copy requested Yes - validated most recent copy scanned in chart (See row information) Yes - validated most recent copy scanned in chart (See row information) No - copy requested  ?Would patient like information on creating a medical advance directive?     No - Patient declined No - Patient declined   ? ? ?Current Medications (verified) ?Outpatient Encounter Medications as of 08/04/2021  ?Medication Sig  ? Accu-Chek Softclix Lancets lancets USE AS DIRECTED TO CHECK BLOOD SUGAR ONCE DAILY AND AS NEEDED.  ? Aspirin-Caffeine (BC FAST PAIN RELIEF PO) Take 1 packet by mouth daily as needed (headache).  ? Cholecalciferol (VITAMIN D3) 50 MCG (2000 UT) capsule Take 1 capsule (2,000 Units total) by mouth daily.  ? furosemide (LASIX) 40 MG tablet TAKE 1 TABLET BY MOUTH  DAILY  ? glipiZIDE (GLUCOTROL XL) 10 MG 24 hr tablet TAKE 1 TABLET BY MOUTH  DAILY WITH BREAKFAST  ? glucose blood (ACCU-CHEK AVIVA PLUS) test strip USE AS DIRECTED TO CHECK BLOOD SUGAR ONCE DAILY AND AS NEEDED.  ? losartan (COZAAR) 50 MG tablet TAKE 1 TABLET BY MOUTH  DAILY  ? metFORMIN (GLUCOPHAGE) 1000 MG tablet Take 1 tablet (1,000 mg total) by mouth 2 (two) times daily with a meal.  ? naproxen sodium (ALEVE) 220 MG tablet Take 220 mg by mouth daily as needed (Pain).   ? omeprazole (PRILOSEC) 20 MG capsule TAKE 1 CAPSULE BY MOUTH  DAILY  ? Pseudoephedrine HCl (SINUS & ALLERGY 12 HOUR PO) Take 1  tablet by mouth daily.   ? simvastatin (ZOCOR) 40 MG tablet TAKE 1 TABLET BY MOUTH AT  BEDTIME  ? triamcinolone cream (KENALOG) 0.1 % APPLY TO AFFECTED AREA(S)  TOPICALLY TWO TIMES A DAY  ? ?No facility-administered encounter medications on file as of 08/04/2021.  ? ? ?Allergies (verified) ?Rofecoxib; Antihistamines, chlorpheniramine-type; Sulfa antibiotics; and Tetanus-diphtheria toxoids td  ? ?History: ?Past Medical History:  ?Diagnosis Date  ? Adenomatous colon polyp 2003  ? Benign neoplasm of colon   ? Carpal tunnel syndrome   ? much better  ? Chest pain,  unspecified   ? CHF (congestive heart failure) ([place])   ? diastolic  ? Complication of anesthesia   ? careful in positioning due to back pain  ? Degeneration of intervertebral disc, site unspecified   ? Diabetes mellitus   ? type II  ? Encounter for long-term (current) use of other medications   ? Fluid overload   ? GERD (gastroesophageal reflux disease)   ? Heart disease, unspecified   ? History of kidney stones   ? with stent/hospitalized  ? Hyperlipidemia   ? Hypertension   ? Obesity, unspecified   ? Rosacea   ? Shortness of breath   ? Unspecified asthma(493.90)   ? Unspecified sleep apnea   ? no cpap/ sleeps sitting up  ? ?Past Surgical History:  ?Procedure Laterality Date  ? ABDOMINAL HYSTERECTOMY    ? partial, fibroids  ? BOTOX INJECTION N/A 02/09/2020  ? Procedure: BOTOX INJECTION;  Surgeon: Hollice Espy, MD;  Location: [facility] OR[facility];  Service: Urology;  Laterality: N/A;  ? CYSTOSCOPY WITH STENT PLACEMENT Left 02/18/2019  ? Procedure: CYSTOSCOPY WITH STENT PLACEMENT;  Surgeon: Abbie Sons, MD;  Location: [facility];  Service: Urology;  Laterality: Left;  ? CYSTOSCOPY/URETEROSCOPY/HOLMIUM LASER/STENT PLACEMENT Left 04/01/2019  ? Procedure: CYSTOSCOPY/URETEROSCOPY/HOLMIUM LASER/STENT PLACEMENT;  Surgeon: Abbie Sons, MD;  Location: [facility];  Service: Urology;  Laterality: Left;  ? ESOPHAGOGASTRODUODENOSCOPY  2000  ? TONSILLECTOMY    ? UMBILICAL HERNIA REPAIR  01/2004  ? URETERAL STENT PLACEMENT  2/13  ? for L sided stone/ ecoli sepsis  ? ?Family History  ?Problem Relation Age of Onset  ? Early death Father 43  ?     train accident  ? Diabetes Mother   ? Heart disease Mother   ? Diabetes Sister   ? Diabetes Brother   ? Colon cancer Brother   ? Cancer Paternal Grandmother   ?     unknown  ? Diabetes Sister   ? ?Social History  ? ?Socioeconomic History  ? Marital status: Widowed  ?  Spouse name: Not on file  ? Number of children: Not on file  ? Years of education: Not on file  ? Highest education level: Not on  file  ?Occupational History  ? Not on file  ?Tobacco Use  ? Smoking status: Never  ?  Passive exposure: Past  ? Smokeless tobacco: Never  ?Vaping Use  ? Vaping Use: Never used  ?Substance and Sexual Activity  ? Alcohol use: No  ?  Alcohol/week: 0.0 standard drinks  ? Drug use: No  ? Sexual activity: Never  ?Other Topics Concern  ? Not on file  ?Social History Narrative  ? Not on file  ? ?Social Determinants of Health  ? ?Financial Resource Strain: Low Risk   ? Difficulty of Paying Living Expenses: Not hard at all  ?Food Insecurity: No Food Insecurity  ? Worried About Charity fundraiser in the  Last Year: Never true  ? Ran Out of Food in the Last Year: Never true  ?Transportation Needs: No Transportation Needs  ? Lack of Transportation (Medical): No  ? Lack of Transportation (Non-Medical): No  ?Physical Activity: Inactive  ? Days of Exercise per Week: 0 days  ? Minutes of Exercise per Session: 0 min  ?Stress: No Stress Concern Present  ? Feeling of Stress : Not at all  ?Social Connections: Not on file  ? ? ?Tobacco Counseling ?Counseling given: Not Answered ? ? ?Clinical Intake: ? ?Pre-visit preparation completed: Yes ? ?Pain : No/denies pain ? ?  ? ?Nutritional Risks: None ?Diabetes: Yes ?CBG done?: No ?Did pt. bring in CBG monitor from home?: No ? ?How often do you need to have someone help you when you read instructions, pamphlets, or other written materials from your doctor or pharmacy?: 1 - Never ? ?Diabetic?yes ?Nutrition Risk Assessment: ? ?Has the patient had any N/V/D within the last 2 months?  No  ?Does the patient have any non-healing wounds?  No  ?Has the patient had any unintentional weight loss or weight gain?  No  ? ?Diabetes: ? ?Is the patient diabetic?  Yes  ?If diabetic, was a CBG obtained today?  No  ?Did the patient bring in their glucometer from home?  No  ?How often do you monitor your CBG's? never ? ?Financial Strains and Diabetes Management: ? ?Are you having any financial strains with the  device, your supplies or your medication? No .  ?Does the patient want to be seen by Chronic Care Management for management of their diabetes?  No  ?Would the patient like to be referred to a Nutritionist or for

## 2021-08-25 ENCOUNTER — Other Ambulatory Visit: Payer: Self-pay | Admitting: *Deleted

## 2021-08-25 MED ORDER — METFORMIN HCL 1000 MG PO TABS: 1000.0000 mg | ORAL_TABLET | Freq: Two times a day (BID) | ORAL | 1 refills | Status: DC

## 2021-10-19 ENCOUNTER — Ambulatory Visit (INDEPENDENT_AMBULATORY_CARE_PROVIDER_SITE_OTHER): Payer: Medicare Other | Admitting: Family Medicine

## 2021-10-19 ENCOUNTER — Encounter: Payer: Self-pay | Admitting: Family Medicine

## 2021-10-19 VITALS — BP 116/78 | HR 90 | Ht 60.0 in | Wt 202.8 lb

## 2021-10-19 DIAGNOSIS — M436 Torticollis: Secondary | ICD-10-CM | POA: Diagnosis not present

## 2021-10-19 DIAGNOSIS — I10 Essential (primary) hypertension: Secondary | ICD-10-CM

## 2021-10-19 DIAGNOSIS — R5382 Chronic fatigue, unspecified: Secondary | ICD-10-CM

## 2021-10-19 DIAGNOSIS — E1169 Type 2 diabetes mellitus with other specified complication: Secondary | ICD-10-CM | POA: Diagnosis not present

## 2021-10-19 DIAGNOSIS — Z79899 Other long term (current) drug therapy: Secondary | ICD-10-CM

## 2021-10-19 DIAGNOSIS — E1122 Type 2 diabetes mellitus with diabetic chronic kidney disease: Secondary | ICD-10-CM

## 2021-10-19 DIAGNOSIS — G4733 Obstructive sleep apnea (adult) (pediatric): Secondary | ICD-10-CM

## 2021-10-19 DIAGNOSIS — E785 Hyperlipidemia, unspecified: Secondary | ICD-10-CM

## 2021-10-19 NOTE — Assessment & Plan Note (Signed)
Discussed how this problem influences overall health and the risks it imposes  Reviewed plan for weight loss with lower calorie diet (via better food choices and also portion control or program like weight watchers) and exercise building up to or more than 30 minutes 5 days per week including some aerobic activity   May be adding to OSA

## 2021-10-19 NOTE — Assessment & Plan Note (Signed)
a1c ordered  Eating less sweets and smaller portions  Reviewed home readings  Metformin 1000 mg bid  Glipizide xl 10 mg daily

## 2021-10-19 NOTE — Progress Notes (Signed)
Subjective:    Patient ID: Gabriella Howe, female    DOB: [birth year], 87 y.o.   MRN: [id number]  HPI Pt presents for c/o fatigue   Wt Readings from Last 3 Encounters:  10/19/21 202 lb 12.8 oz (92 kg)  08/04/21 202 lb (91.6 kg)  05/12/21 202 lb (91.6 kg)   39.61 kg/m  Thinks age is creeping up on her  Tired all the time  Goes to bed tired and gets up tired Doing less and less  Is sleepy also /nods off easily    Stiff area in neck- makes it hard to turn   Does not exercise  Has a pedaler-does not use often   OSA - many years ago  Last cpap 15 y ago  She sleeps in a recliner    HTN bp is stable today  No cp or palpitations or headaches or edema  No side effects to medicines  BP Readings from Last 3 Encounters:  10/19/21 116/78  05/12/21 120/86  11/23/20 (!) 156/89     Losartan 50 mg daily  Lasix 40 mg daily   Still working part time It is good for her  Most of it from home and by mail     DM Lab Results  Component Value Date   HGBA1C 8.1 (H) 05/05/2021   Was eating more sweets and carbs  Arb/statin  Glipizide xl 10 mg daily  Metformin 1000 mg bid   Diet is better than in February  Cut out the sweets and coffee at night  Less sweet things  Not eating as much    Last month her blood sugars ran around 120 in am / 2 h pp 160s  Up and down     Ckd Lab Results  Component Value Date   CREATININE 0.91 05/05/2021   BUN 17 05/05/2021   NA 144 05/05/2021   K 4.0 05/05/2021   CL 107 05/05/2021   CO2 29 05/05/2021    Patient Active Problem List   Diagnosis Date Noted   Current use of proton pump inhibitor 10/19/2021   Neck stiffness 10/19/2021   Cough 11/02/2020   Kidney stones 11/02/2020   Asthma 10/23/2020   CKD (chronic kidney disease), stage II 10/23/2020   Pedal edema 08/05/2019   DJD (degenerative joint disease) 02/26/2019   Iron deficiency anemia 04/30/2017   History of shingles 10/03/2016   Routine general medical examination  at a health care facility 04/14/2015   Knee pain 07/28/2014   Incomplete emptying of bladder 11/28/2012   Encounter for Medicare annual wellness exam 11/19/2012   History of kidney stones 11/19/2012   Fatigue 63/78/5885   DIASTOLIC HEART FAILURE, CHRONIC 07/29/2009   Hyperlipidemia associated with type 2 diabetes mellitus ([place]) 09/13/2006   Morbid obesity ([place]) 09/10/2006   CARPAL TUNNEL SYNDROME 09/10/2006   Essential hypertension 02/77/4128   DIASTOLIC DYSFUNCTION 78/67/6720   REACTIVE AIRWAY DISEASE 09/10/2006   GERD 09/10/2006   ROSACEA 09/10/2006   Degeneration of lumbar or lumbosacral intervertebral disc 09/10/2006   OSA (obstructive sleep apnea) 09/10/2006   COLONIC POLYPS, HX OF 09/10/2006   Type II diabetes mellitus with renal manifestations ([place]) 09/06/2006   Past Medical History:  Diagnosis Date   Adenomatous colon polyp 2003   Benign neoplasm of colon    Carpal tunnel syndrome    much better   Chest pain, unspecified    CHF (congestive heart failure) (HCC)    diastolic   Complication of anesthesia    careful  in positioning due to back pain   Degeneration of intervertebral disc, site unspecified    Diabetes mellitus    type II   Encounter for long-term (current) use of other medications    Fluid overload    GERD (gastroesophageal reflux disease)    Heart disease, unspecified    History of kidney stones    with stent/hospitalized   Hyperlipidemia    Hypertension    Obesity, unspecified    Rosacea    Shortness of breath    Unspecified asthma(493.90)    Unspecified sleep apnea    no cpap/ sleeps sitting up   Past Surgical History:  Procedure Laterality Date   ABDOMINAL HYSTERECTOMY     partial, fibroids   BOTOX INJECTION N/A 02/09/2020   Procedure: BOTOX INJECTION;  Surgeon: Hollice Espy, MD;  Location: [facility];  Service: Urology;  Laterality: N/A;   CYSTOSCOPY WITH STENT PLACEMENT Left 02/18/2019   Procedure: CYSTOSCOPY WITH STENT PLACEMENT;   Surgeon: Abbie Sons, MD;  Location: [facility];  Service: Urology;  Laterality: Left;   CYSTOSCOPY/URETEROSCOPY/HOLMIUM LASER/STENT PLACEMENT Left 04/01/2019   Procedure: CYSTOSCOPY/URETEROSCOPY/HOLMIUM LASER/STENT PLACEMENT;  Surgeon: Abbie Sons, MD;  Location: [facility];  Service: Urology;  Laterality: Left;   ESOPHAGOGASTRODUODENOSCOPY  2000   TONSILLECTOMY     UMBILICAL HERNIA REPAIR  01/2004   URETERAL STENT PLACEMENT  2/13   for L sided stone/ ecoli sepsis   Social History   Tobacco Use   Smoking status: Never    Passive exposure: Past   Smokeless tobacco: Never  Vaping Use   Vaping Use: Never used  Substance Use Topics   Alcohol use: No    Alcohol/week: 0.0 standard drinks of alcohol   Drug use: No   Family History  Problem Relation Age of Onset   Early death Father 79       train accident   Diabetes Mother    Heart disease Mother    Diabetes Sister    Diabetes Brother    Colon cancer Brother    Cancer Paternal Grandmother        unknown   Diabetes Sister    Allergies  Allergen Reactions   Rofecoxib     REACTION: reaction not known   Antihistamines, Chlorpheniramine-Type Palpitations   Sulfa Antibiotics Rash   Tetanus-Diphtheria Toxoids Td Other (See Comments)    REACTION: sick with fever   Current Outpatient Medications on File Prior to Visit  Medication Sig Dispense Refill   Accu-Chek Softclix Lancets lancets USE AS DIRECTED TO CHECK BLOOD SUGAR ONCE DAILY AND AS NEEDED. 100 each 3   Aspirin-Caffeine (BC FAST PAIN RELIEF PO) Take 1 packet by mouth daily as needed (headache).     Cholecalciferol (VITAMIN D3) 50 MCG (2000 UT) capsule Take 1 capsule (2,000 Units total) by mouth daily. 90 capsule 3   furosemide (LASIX) 40 MG tablet TAKE 1 TABLET BY MOUTH  DAILY 90 tablet 1   glipiZIDE (GLUCOTROL XL) 10 MG 24 hr tablet TAKE 1 TABLET BY MOUTH  DAILY WITH BREAKFAST 90 tablet 1   glucose blood (ACCU-CHEK AVIVA PLUS) test strip USE AS DIRECTED TO CHECK BLOOD  SUGAR ONCE DAILY AND AS NEEDED. 90 each 3   losartan (COZAAR) 50 MG tablet TAKE 1 TABLET BY MOUTH  DAILY 90 tablet 1   metFORMIN (GLUCOPHAGE) 1000 MG tablet Take 1 tablet (1,000 mg total) by mouth 2 (two) times daily with a meal. 180 tablet 1   naproxen sodium (ALEVE) 220 MG  tablet Take 220 mg by mouth daily as needed (Pain).      omeprazole (PRILOSEC) 20 MG capsule TAKE 1 CAPSULE BY MOUTH  DAILY 90 capsule 1   Pseudoephedrine HCl (SINUS & ALLERGY 12 HOUR PO) Take 1 tablet by mouth daily.      simvastatin (ZOCOR) 40 MG tablet TAKE 1 TABLET BY MOUTH AT  BEDTIME 90 tablet 1   triamcinolone cream (KENALOG) 0.1 % APPLY TO AFFECTED AREA(S)  TOPICALLY TWO TIMES A DAY 30 g 1   No current facility-administered medications on file prior to visit.    Review of Systems  Constitutional:  Positive for fatigue. Negative for activity change, appetite change, fever and unexpected weight change.  HENT:  Negative for congestion, ear pain, rhinorrhea, sinus pressure and sore throat.   Eyes:  Negative for pain, redness and visual disturbance.  Respiratory:  Negative for cough, shortness of breath and wheezing.   Cardiovascular:  Positive for leg swelling. Negative for chest pain and palpitations.  Gastrointestinal:  Negative for abdominal pain, blood in stool, constipation and diarrhea.  Endocrine: Negative for polydipsia and polyuria.  Genitourinary:  Negative for dysuria, frequency and urgency.  Musculoskeletal:  Positive for neck stiffness. Negative for arthralgias, back pain and myalgias.  Skin:  Negative for pallor and rash.  Allergic/Immunologic: Negative for environmental allergies.  Neurological:  Negative for dizziness, syncope and headaches.  Hematological:  Negative for adenopathy. Does not bruise/bleed easily.  Psychiatric/Behavioral:  Negative for decreased concentration and dysphoric mood. The patient is not nervous/anxious.        Objective:   Physical Exam Constitutional:      General:  She is not in acute distress.    Appearance: Normal appearance. She is well-developed. She is obese. She is not ill-appearing or diaphoretic.  HENT:     Head: Normocephalic and atraumatic.  Eyes:     Conjunctiva/sclera: Conjunctivae normal.     Pupils: Pupils are equal, round, and reactive to light.  Neck:     Thyroid: No thyromegaly.     Vascular: No carotid bruit or JVD.  Cardiovascular:     Rate and Rhythm: Normal rate and regular rhythm.     Heart sounds: Normal heart sounds.     No gallop.  Pulmonary:     Effort: Pulmonary effort is normal. No respiratory distress.     Breath sounds: Normal breath sounds. No wheezing or rales.  Abdominal:     General: There is no distension or abdominal bruit.     Palpations: Abdomen is soft.     Tenderness: There is no abdominal tenderness. There is no right CVA tenderness, left CVA tenderness, guarding or rebound.  Musculoskeletal:     Cervical back: Normal range of motion and neck supple. No tenderness.     Right lower leg: Edema present.     Left lower leg: Edema present.     Comments: Trace pedal edema  Limited rom of neck -rotation and flex/ext  No spinal tenderness Tight muscles bilaterally  Lymphadenopathy:     Cervical: No cervical adenopathy.  Skin:    General: Skin is warm and dry.     Coloration: Skin is not pale.     Findings: No rash.  Neurological:     Mental Status: She is alert.     Coordination: Coordination normal.     Deep Tendon Reflexes: Reflexes are normal and symmetric. Reflexes normal.  Psychiatric:        Mood and Affect: Mood normal.  Cognition and Memory: Cognition and memory normal.           Assessment & Plan:   Problem List Items Addressed This Visit       Cardiovascular and Mediastinum   Essential hypertension    bp in fair control at this time  BP Readings from Last 1 Encounters:  10/19/21 116/78  No changes needed Most recent labs reviewed  Disc lifstyle change with low sodium  diet and exercise  Lab today  Taking losartan 50 mg daily       Relevant Orders   Comprehensive metabolic panel   CBC with Differential/Platelet   TSH     Respiratory   OSA (obstructive sleep apnea)    Not using cpap  Sleeps in a recliner  More tired  Nodding off  Disc risks of sleep apnea  Interested in home sleep study Ref done to pulm for further evaluation       Relevant Orders   Ambulatory referral to Pulmonology     Endocrine   Hyperlipidemia associated with type 2 diabetes mellitus ([place])    Disc goals for lipids and reasons to control them Rev last labs with pt Rev low sat fat diet in detail Labs ordered  Taking simvastatin 40 mg daily        Relevant Orders   Lipid panel   Type II diabetes mellitus with renal manifestations ([place])    a1c ordered  Eating less sweets and smaller portions  Reviewed home readings  Metformin 1000 mg bid  Glipizide xl 10 mg daily       Relevant Orders   Hemoglobin A1c     Other   Current use of proton pump inhibitor    B12 ordered Fatigued as well      Relevant Orders   Vitamin B12   Fatigue - Primary    Lab today  Suspect OSA is worse - ref to pulmonary to address   Enc better diet and exercise as tolerated  Exam is reassuring       Relevant Orders   Ambulatory referral to Pulmonology   Morbid obesity ([place])    Discussed how this problem influences overall health and the risks it imposes  Reviewed plan for weight loss with lower calorie diet (via better food choices and also portion control or program like weight watchers) and exercise building up to or more than 30 minutes 5 days per week including some aerobic activity   May be adding to OSA      Neck stiffness    Very limited rom on exam Sleeps in a recliner without pillow   Recommend considering PT  Enc her to stretch as able

## 2021-10-19 NOTE — Patient Instructions (Addendum)
Get a multi vitamin to take daily  Try and eat a balance diet  Take your vitamins wit food   I placed a pulmonary referral to check in regarding sleep apnea  If you don't get a call in a week let us know  Labs today for fatigue and diabetes also

## 2021-10-19 NOTE — Assessment & Plan Note (Signed)
bp in fair control at this time  BP Readings from Last 1 Encounters:  10/19/21 116/78   No changes needed Most recent labs reviewed  Disc lifstyle change with low sodium diet and exercise  Lab today  Taking losartan 50 mg daily

## 2021-10-19 NOTE — Assessment & Plan Note (Signed)
Very limited rom on exam Sleeps in a recliner without pillow   Recommend considering PT  Enc her to stretch as able

## 2021-10-19 NOTE — Assessment & Plan Note (Signed)
B12 ordered Fatigued as well

## 2021-10-19 NOTE — Assessment & Plan Note (Signed)
Disc goals for lipids and reasons to control them Rev last labs with pt Rev low sat fat diet in detail Labs ordered  Taking simvastatin 40 mg daily

## 2021-10-19 NOTE — Assessment & Plan Note (Signed)
Not using cpap  Sleeps in a recliner  More tired  Nodding off  Disc risks of sleep apnea  Interested in home sleep study Ref done to pulm for further evaluation

## 2021-10-19 NOTE — Assessment & Plan Note (Signed)
Lab today  Suspect OSA is worse - ref to pulmonary to address   Enc better diet and exercise as tolerated  Exam is reassuring

## 2021-10-20 LAB — CBC WITH DIFFERENTIAL/PLATELET
Basophils Absolute: 0.1 10*3/uL (ref 0.0–0.1)
Basophils Relative: 0.9 % (ref 0.0–3.0)
Eosinophils Absolute: 0.2 10*3/uL (ref 0.0–0.7)
Eosinophils Relative: 3.2 % (ref 0.0–5.0)
HCT: 40.4 % (ref 36.0–46.0)
Hemoglobin: 13.8 g/dL (ref 12.0–15.0)
Lymphocytes Relative: 25 % (ref 12.0–46.0)
Lymphs Abs: 1.6 10*3/uL (ref 0.7–4.0)
MCHC: 34.2 g/dL (ref 30.0–36.0)
MCV: 89.9 fl (ref 78.0–100.0)
Monocytes Absolute: 0.4 10*3/uL (ref 0.1–1.0)
Monocytes Relative: 5.6 % (ref 3.0–12.0)
Neutro Abs: 4.2 10*3/uL (ref 1.4–7.7)
Neutrophils Relative %: 65.3 % (ref 43.0–77.0)
Platelets: 191 10*3/uL (ref 150.0–400.0)
RBC: 4.5 Mil/uL (ref 3.87–5.11)
RDW: 13.4 % (ref 11.5–15.5)
WBC: 6.4 10*3/uL (ref 4.0–10.5)

## 2021-10-20 LAB — HEMOGLOBIN A1C: Hgb A1c MFr Bld: 7.2 % — ABNORMAL HIGH (ref 4.6–6.5)

## 2021-10-20 LAB — COMPREHENSIVE METABOLIC PANEL
ALT: 11 U/L (ref 0–35)
AST: 13 U/L (ref 0–37)
Albumin: 4.3 g/dL (ref 3.5–5.2)
Alkaline Phosphatase: 61 U/L (ref 39–117)
BUN: 19 mg/dL (ref 6–23)
CO2: 26 mEq/L (ref 19–32)
Calcium: 9.7 mg/dL (ref 8.4–10.5)
Chloride: 105 mEq/L (ref 96–112)
Creatinine, Ser: 0.99 mg/dL (ref 0.40–1.20)
GFR: 52.2 mL/min — ABNORMAL LOW (ref >60.00)
Glucose, Bld: 117 mg/dL — ABNORMAL HIGH (ref 70–99)
Potassium: 3.6 mEq/L (ref 3.5–5.1)
Sodium: 143 mEq/L (ref 135–145)
Total Bilirubin: 1 mg/dL (ref 0.2–1.2)
Total Protein: 6.7 g/dL (ref 6.0–8.3)

## 2021-10-20 LAB — VITAMIN B12: Vitamin B-12: 223 pg/mL (ref 211–911)

## 2021-10-20 LAB — LIPID PANEL
Cholesterol: 113 mg/dL (ref 0–200)
HDL: 46.9 mg/dL (ref >39.00)
LDL Cholesterol: 30 mg/dL (ref 0–99)
NonHDL: 66
Total CHOL/HDL Ratio: 2
Triglycerides: 180 mg/dL — ABNORMAL HIGH (ref 0.0–149.0)
VLDL: 36 mg/dL (ref 0.0–40.0)

## 2021-10-20 LAB — TSH: TSH: 1.26 u[IU]/mL (ref 0.35–5.50)

## 2021-10-31 ENCOUNTER — Other Ambulatory Visit: Payer: Self-pay | Admitting: Family Medicine

## 2021-11-01 DIAGNOSIS — H43813 Vitreous degeneration, bilateral: Secondary | ICD-10-CM | POA: Diagnosis not present

## 2021-11-01 DIAGNOSIS — E119 Type 2 diabetes mellitus without complications: Secondary | ICD-10-CM | POA: Diagnosis not present

## 2021-11-01 DIAGNOSIS — H26493 Other secondary cataract, bilateral: Secondary | ICD-10-CM | POA: Diagnosis not present

## 2021-11-01 LAB — HM DIABETES EYE EXAM

## 2021-11-02 ENCOUNTER — Other Ambulatory Visit: Payer: Self-pay | Admitting: Family Medicine

## 2021-11-15 ENCOUNTER — Encounter: Payer: Self-pay | Admitting: Adult Health

## 2021-11-15 ENCOUNTER — Ambulatory Visit (INDEPENDENT_AMBULATORY_CARE_PROVIDER_SITE_OTHER): Payer: Medicare Other | Admitting: Adult Health

## 2021-11-15 VITALS — BP 126/70 | HR 82 | Temp 98.2°F | Ht 60.0 in | Wt 203.0 lb

## 2021-11-15 DIAGNOSIS — R0683 Snoring: Secondary | ICD-10-CM | POA: Diagnosis not present

## 2021-11-15 DIAGNOSIS — G4733 Obstructive sleep apnea (adult) (pediatric): Secondary | ICD-10-CM

## 2021-11-15 DIAGNOSIS — R400 Somnolence: Secondary | ICD-10-CM | POA: Diagnosis not present

## 2021-11-15 NOTE — Patient Instructions (Addendum)
Stop Severe allergy and headache medicine.  Begin Claritin '10mg'$  daily .   Healthy sleep regimen  Work on healthy weight  Do not drive if sleepy  Set up for home sleep study .   Follow up in 6-8 weeks to discuss results and treatment plan and As needed

## 2021-11-15 NOTE — Assessment & Plan Note (Signed)
Previous diagnosis of sleep apnea, snoring, morning headaches, daytime sleepiness all suspicious for ongoing sleep apnea.  Patient did have previous sleep study in 2019 that was negative for sleep apnea however says she did not sleep hardly at all.  We will repeat her sleep study but do a home sleep study.  Patient education was given  .- discussed how weight can impact sleep and risk for sleep disordered breathing - discussed options to assist with weight loss: combination of diet modification, cardiovascular and strength training exercises   - had an extensive discussion regarding the adverse health consequences related to untreated sleep disordered breathing - specifically discussed the risks for hypertension, coronary artery disease, cardiac dysrhythmias, cerebrovascular disease, and diabetes - lifestyle modification discussed   - discussed how sleep disruption can increase risk of accidents, particularly when driving - safe driving practices were discussed  Set up for Home sleep study   Plan  Patient Instructions  Stop Severe allergy and headache medicine.  Begin Claritin '10mg'$  daily .   Healthy sleep regimen  Work on healthy weight  Do not drive if sleepy  Set up for home sleep study .   Follow up in 6-8 weeks to discuss results and treatment plan and As needed

## 2021-11-15 NOTE — Progress Notes (Signed)
$'@Patient'o$  ID: Gabriella Howe, female    DOB: 01/06/1935, 87 y.o.   MRN: [id number]  Chief Complaint  Patient presents with   Consult    Referring provider: Abner Greenspan, MD  HPI: 87 year old female seen for sleep consult November 15, 2021 to establish for sleep apnea with daytime sleepiness, snoring, and fatigue.  Former patient of Dr. Ashby Dawes   TEST/EVENTS :  NPSG 08/16/2017 - Neg for OSA   11/15/2021 Sleep consult  Presents for sleep consult today.  Kindly referred by her primary care provider Dr. Glori Bickers.  Patient was seen previously in our [place] clinic by Dr. Ashby Dawes for sleep apnea.  She had been diagnosed with sleep apnea for greater than 13 years ago.  Says that she got a CPAP machine but did not like it and returned it.  Complains of loud snoring, morning headaches, and daytime sleepiness. Always feels tired and like she could take a nap. Sleeps in recliner due to chronic back and joint pain. Patient was set up for a in lab sleep study last visit completed on Aug 16, 2017 that was negative for sleep apnea.  Says the sleep test was awful, did not hardly sleep at all.  Patient complains of feeling tired all the time.  Falls asleep very easily.  Typically goes to bed about 11 PM to 1 AM.  Falls asleep pretty quickly.  Only gets up about once at night.  Typically gets up about 6:30 AM but goes back to sleep and naps until 9 AM.  Does not operate heavy machinery. Caffeine intake.- 1-2 cups of coffee  history of stroke or congestive heart failure.  No symptoms suspicious for cataplexy or sleep paralysis.  Does not take sleep aids.  Not on any chronic pain medicines. Epworth score is 20 out of 24.  She gets sleepy at most any time especially if she sits down, watches TV, passenger of a car or in the afternoon hours. Complains of snoring, a.m. headaches, dry mouth. No history of stroke. Has removeable bridge in top teeth .  Not active . Drives . Does shopping [employer].    Medical history significant for hypertension, diabetes, hyperlipidemia, chronic allergies, kidney stones, diastolic CHF , CKD  Surgical history hysterectomy in 1978.,  Appendectomy 1978.  Social history patient is widowed.  Has adult children.  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  Does not smoke.  No alcohol.  No drugs. No alcohol .   Family History positive for COPD, coronary artery disease, arthritis, colon cancer   Allergies  Allergen Reactions   Rofecoxib     REACTION: reaction not known   Antihistamines, Chlorpheniramine-Type Palpitations   Sulfa Antibiotics Rash   Tetanus-Diphtheria Toxoids Td Other (See Comments)    REACTION: sick with fever    Immunization History  Administered Date(s) Administered   Fluad Quad(high Dose 65+) 01/20/2020   Influenza Split 01/24/2011   Influenza Whole 05/10/2009   Influenza,inj,Quad PF,6+ Mos 11/28/2013, 04/14/2015, 04/20/2016, 01/26/2017, 05/06/2018   PFIZER(Purple Top)SARS-COV-2 Vaccination 05/02/2019, 05/23/2019, 03/16/2020   Pneumococcal Conjugate-13 11/28/2013   Pneumococcal Polysaccharide-23 03/28/2003    Past Medical History:  Diagnosis Date   Adenomatous colon polyp 2003   Benign neoplasm of colon    Carpal tunnel syndrome    much better   Chest pain, unspecified    CHF (congestive heart failure) (HCC)    diastolic   Complication of anesthesia    careful in positioning due to back pain  Degeneration of intervertebral disc, site unspecified    Diabetes mellitus    type II   Encounter for long-term (current) use of other medications    Fluid overload    GERD (gastroesophageal reflux disease)    Heart disease, unspecified    History of kidney stones    with stent/hospitalized   Hyperlipidemia    Hypertension    Obesity, unspecified    Rosacea    Shortness of breath    Unspecified asthma(493.90)    Unspecified sleep apnea    no cpap/ sleeps sitting up    Tobacco  History: Social History   Tobacco Use  Smoking Status Never   Passive exposure: Past  Smokeless Tobacco Never   Counseling given: Not Answered   Outpatient Medications Prior to Visit  Medication Sig Dispense Refill   Accu-Chek Softclix Lancets lancets USE AS DIRECTED TO CHECK BLOOD SUGAR ONCE DAILY AND AS NEEDED. 100 each 3   Aspirin-Caffeine (BC FAST PAIN RELIEF PO) Take 1 packet by mouth daily as needed (headache).     Cholecalciferol (VITAMIN D3) 50 MCG (2000 UT) capsule Take 1 capsule (2,000 Units total) by mouth daily. 90 capsule 3   furosemide (LASIX) 40 MG tablet TAKE 1 TABLET BY MOUTH DAILY 100 tablet 2   glipiZIDE (GLUCOTROL XL) 10 MG 24 hr tablet TAKE 1 TABLET BY MOUTH DAILY  WITH BREAKFAST 100 tablet 2   glucose blood (ACCU-CHEK AVIVA PLUS) test strip USE AS DIRECTED TO CHECK BLOOD SUGAR ONCE DAILY AND AS NEEDED. 90 each 3   losartan (COZAAR) 50 MG tablet TAKE 1 TABLET BY MOUTH DAILY 100 tablet 2   metFORMIN (GLUCOPHAGE) 1000 MG tablet TAKE 1 TABLET BY MOUTH TWICE  DAILY WITH A MEAL 200 tablet 2   naproxen sodium (ALEVE) 220 MG tablet Take 220 mg by mouth daily as needed (Pain).      omeprazole (PRILOSEC) 20 MG capsule TAKE 1 CAPSULE BY MOUTH DAILY 100 capsule 2   Pseudoephedrine HCl (SINUS & ALLERGY 12 HOUR PO) Take 1 tablet by mouth daily.      simvastatin (ZOCOR) 40 MG tablet TAKE 1 TABLET BY MOUTH AT  BEDTIME 100 tablet 2   triamcinolone cream (KENALOG) 0.1 % APPLY TO AFFECTED AREA(S)  TOPICALLY TWO TIMES A DAY 30 g 1   No facility-administered medications prior to visit.     Review of Systems:   Constitutional:   No  weight loss, night sweats,  Fevers, chills,  +fatigue, or  lassitude.  HEENT:   No   Difficulty swallowing,  Tooth/dental problems, or  Sore throat,                No sneezing, itching, ear ache, nasal congestion, post nasal drip,   CV:  No chest pain,  Orthopnea, PND, swelling in lower extremities, anasarca, dizziness, palpitations, syncope.   GI   No heartburn, indigestion, abdominal pain, nausea, vomiting, diarrhea, change in bowel habits, loss of appetite, bloody stools.   Resp: No shortness of breath with exertion or at rest.  No excess mucus, no productive cough,  No non-productive cough,  No coughing up of blood.  No change in color of mucus.  No wheezing.  No chest wall deformity  Skin: no rash or lesions.  GU: no dysuria, change in color of urine, no urgency or frequency.  No flank pain, no hematuria   MS:  No joint pain or swelling.  No decreased range of motion.  No back pain.  Physical Exam  BP 126/70 (BP Location: Left Arm, Patient Position: Sitting, Cuff Size: Normal)   Pulse 82   Temp 98.2 F (36.8 C) (Oral)   Ht 5' (1.524 m)   Wt 203 lb (92.1 kg)   SpO2 94%   BMI 39.65 kg/m   GEN: A/Ox3; pleasant , NAD, well nourished    HEENT:  [place]/AT,  EACs-clear, TMs-wnl, NOSE-clear, THROAT-clear, no lesions, no postnasal drip or exudate noted. Class 3 MP airway,  NECK:  Supple w/ fair ROM; no JVD; normal carotid impulses w/o bruits; no thyromegaly or nodules palpated; no lymphadenopathy.    RESP  Clear  P & A; w/o, wheezes/ rales/ or rhonchi. no accessory muscle use, no dullness to percussion  CARD:  RRR, no m/r/g, no peripheral edema, pulses intact, no cyanosis or clubbing.  GI:   Soft & nt; nml bowel sounds; no organomegaly or masses detected.   Musco: Warm bil, no deformities or joint swelling noted.   Neuro: alert, no focal deficits noted.    Skin: Warm, no lesions or rashes    Lab Results:  CBC     Imaging: No results found.        No data to display          No results found for: "NITRICOXIDE"      Assessment & Plan:   OSA (obstructive sleep apnea) Previous diagnosis of sleep apnea, snoring, morning headaches, daytime sleepiness all suspicious for ongoing sleep apnea.  Patient did have previous sleep study in 2019 that was negative for sleep apnea however says she did not sleep  hardly at all.  We will repeat her sleep study but do a home sleep study.  Patient education was given  .- discussed how weight can impact sleep and risk for sleep disordered breathing - discussed options to assist with weight loss: combination of diet modification, cardiovascular and strength training exercises   - had an extensive discussion regarding the adverse health consequences related to untreated sleep disordered breathing - specifically discussed the risks for hypertension, coronary artery disease, cardiac dysrhythmias, cerebrovascular disease, and diabetes - lifestyle modification discussed   - discussed how sleep disruption can increase risk of accidents, particularly when driving - safe driving practices were discussed  Set up for Home sleep study   Plan  Patient Instructions  Stop Severe allergy and headache medicine.  Begin Claritin '10mg'$  daily .   Healthy sleep regimen  Work on healthy weight  Do not drive if sleepy  Set up for home sleep study .   Follow up in 6-8 weeks to discuss results and treatment plan and As needed          I spent   31 minutes dedicated to the care of this patient on the date of this encounter to include pre-visit review of records, face-to-face time with the patient discussing conditions above, post visit ordering of testing, clinical documentation with the electronic health record, making appropriate referrals as documented, and communicating necessary findings to members of the patients care team.    Rexene Edison, NP 11/15/2021

## 2021-11-19 NOTE — Progress Notes (Signed)
Reviewed and agree with assessment/plan.   Chesley Mires, MD [facility] Pulmonary/Critical Care 11/19/2021, 2:27 PM Pager:  [phone]

## 2021-11-19 NOTE — Progress Notes (Signed)
Reviewed and agree with assessment/plan.   Chesley Mires, MD [facility] Pulmonary/Critical Care 11/19/2021, 2:27 PM Pager:  [phone]

## 2021-12-09 NOTE — Progress Notes (Signed)
[facility] Quality Team Note  Name: Gabriella Howe Date of Birth: 19-Nov-1934 MRN: [id number] Date: 12/09/2021  [facility] Quality Team has reviewed this patient's chart, please see recommendations below:  [facility] Quality Other; Pt has open quality gap for KED, needs Micro/Creat urine test to close this. Please address at next visit.

## 2021-12-27 ENCOUNTER — Telehealth: Payer: Self-pay | Admitting: Adult Health

## 2021-12-27 NOTE — Telephone Encounter (Signed)
-----   Message from Donne Hazel sent at 12/27/2021  4:07 PM EDT ----- Regarding: hst schedule Hi Vanna Sailer   Just an FYI  12/27/21-spoke to patient per pt she don't have time to make appt for hst now due to she is having her kitchen redone due to black mold and she has to be there to let the workers. tried to explain to pt she can push appt out per pt SHE DON'T HAVE TIME. Told patient I will alert The ordering provider. Thanks

## 2021-12-27 NOTE — Telephone Encounter (Signed)
Please call patient to let her know to call us back when she wants to reschedule

## 2021-12-28 ENCOUNTER — Encounter: Payer: Self-pay | Admitting: Adult Health

## 2022-01-02 NOTE — Telephone Encounter (Signed)
ATC x1.  LVM to call the office when she is ready to schedule her HST.

## 2022-01-10 NOTE — Telephone Encounter (Signed)
ATC x2.  LVM to return call.

## 2022-01-30 ENCOUNTER — Encounter: Payer: Self-pay | Admitting: *Deleted

## 2022-01-30 NOTE — Telephone Encounter (Signed)
Unable to reach letter sent.  Nothing further needed.

## 2022-02-08 ENCOUNTER — Ambulatory Visit: Payer: Medicare Other | Admitting: Family Medicine

## 2022-02-13 ENCOUNTER — Ambulatory Visit (INDEPENDENT_AMBULATORY_CARE_PROVIDER_SITE_OTHER): Payer: Medicare Other | Admitting: Family Medicine

## 2022-02-13 ENCOUNTER — Encounter: Payer: Self-pay | Admitting: Family Medicine

## 2022-02-13 ENCOUNTER — Ambulatory Visit (INDEPENDENT_AMBULATORY_CARE_PROVIDER_SITE_OTHER)
Admission: RE | Admit: 2022-02-13 | Discharge: 2022-02-13 | Disposition: A | Discharge status: Home / Self Care | Payer: Medicare Other | Source: Ambulatory Visit | Attending: Family Medicine | Admitting: Family Medicine

## 2022-02-13 VITALS — BP 146/78 | HR 77 | Temp 97.6°F | Ht 60.0 in | Wt 209.5 lb

## 2022-02-13 DIAGNOSIS — G4486 Cervicogenic headache: Secondary | ICD-10-CM

## 2022-02-13 DIAGNOSIS — M542 Cervicalgia: Secondary | ICD-10-CM | POA: Diagnosis not present

## 2022-02-13 DIAGNOSIS — R519 Headache, unspecified: Secondary | ICD-10-CM | POA: Insufficient documentation

## 2022-02-13 DIAGNOSIS — M436 Torticollis: Secondary | ICD-10-CM

## 2022-02-13 DIAGNOSIS — M4312 Spondylolisthesis, cervical region: Secondary | ICD-10-CM | POA: Diagnosis not present

## 2022-02-13 LAB — CBC WITH DIFFERENTIAL/PLATELET
Basophils Absolute: 0.1 10*3/uL (ref 0.0–0.1)
Basophils Relative: 0.8 % (ref 0.0–3.0)
Eosinophils Absolute: 0.1 10*3/uL (ref 0.0–0.7)
Eosinophils Relative: 2 % (ref 0.0–5.0)
HCT: 39.5 % (ref 36.0–46.0)
Hemoglobin: 13.3 g/dL (ref 12.0–15.0)
Lymphocytes Relative: 19.7 % (ref 12.0–46.0)
Lymphs Abs: 1.3 10*3/uL (ref 0.7–4.0)
MCHC: 33.5 g/dL (ref 30.0–36.0)
MCV: 90.3 fl (ref 78.0–100.0)
Monocytes Absolute: 0.5 10*3/uL (ref 0.1–1.0)
Monocytes Relative: 6.9 % (ref 3.0–12.0)
Neutro Abs: 4.7 10*3/uL (ref 1.4–7.7)
Neutrophils Relative %: 70.6 % (ref 43.0–77.0)
Platelets: 203 10*3/uL (ref 150.0–400.0)
RBC: 4.38 Mil/uL (ref 3.87–5.11)
RDW: 13.9 % (ref 11.5–15.5)
WBC: 6.7 10*3/uL (ref 4.0–10.5)

## 2022-02-13 LAB — C-REACTIVE PROTEIN: CRP: <1 mg/dL (ref 0.5–20.0)

## 2022-02-13 LAB — SEDIMENTATION RATE: Sed Rate: 15 mm/hr (ref 0–30)

## 2022-02-13 MED ORDER — PREDNISONE 10 MG PO TABS: ORAL_TABLET | ORAL | 0 refills | Status: DC

## 2022-02-13 NOTE — Patient Instructions (Addendum)
Take prednisone as directed for headache and neck pain  It may make you feel hyper and hungry when you are on it   Lab today  Xray of neck today   Plan to follow   Use gentle heat on your neck for 10 minutes at a time  You can an ice pack on the left temple area for the headache

## 2022-02-13 NOTE — Assessment & Plan Note (Signed)
Reduced rom due to chronic pain

## 2022-02-13 NOTE — Progress Notes (Signed)
Subjective:    Patient ID: Gabriella Howe, female    DOB: 04-05-1934, 87 y.o.   MRN: [id number]  HPI Here for headache   Wt Readings from Last 3 Encounters:  02/13/22 209 lb 8 oz (95 kg)  11/15/21 203 lb (92.1 kg)  10/19/21 202 lb 12.8 oz (92 kg)   40.92 kg/m  Has water damage at her house  Living in a hotel for 6 weeks - is moving back now  ? 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   Going out to [employer] meal with friends and sister  Stays social    Has arthritis in her neck  Headache - since jan this year  Hurts to turn her head (all the time) Now having some pain in L ear and forehead- now whole L side (worse than right) Some in R /front and top   Not throbbing  Constant (worse when she moves her neck)  Sharp in nature  Ear pain is very sharp    Took aleve this am  Last [facility] was 2 weeks ago A little throbbing   No h/o migraines Had tension headaches in the past   Probably not enough fluids  Not a lot of caffeine- 2 c coffee per day max  Some diet green tea   OSA_in midst of work up   Patient Active Problem List   Diagnosis Date Noted   Head pain 02/13/2022   Neck pain 02/13/2022   Current use of proton pump inhibitor 10/19/2021   Neck stiffness 10/19/2021   Cough 11/02/2020   Kidney stones 11/02/2020   Asthma 10/23/2020   CKD (chronic kidney disease), stage II 10/23/2020   Pedal edema 08/05/2019   DJD (degenerative joint disease) 02/26/2019   Iron deficiency anemia 04/30/2017   History of shingles 10/03/2016   Routine general medical examination at a health care facility 04/14/2015   Knee pain 07/28/2014   Incomplete emptying of bladder 11/28/2012   Encounter for Medicare annual wellness exam 11/19/2012   History of kidney stones 11/19/2012   Fatigue 44/05/4740   DIASTOLIC HEART FAILURE, CHRONIC 07/29/2009   Hyperlipidemia associated with type 2 diabetes mellitus ([place]) 09/13/2006   Morbid obesity  ([place]) 09/10/2006   CARPAL TUNNEL SYNDROME 09/10/2006   Essential hypertension 59/56/3875   DIASTOLIC DYSFUNCTION 64/33/2951   REACTIVE AIRWAY DISEASE 09/10/2006   GERD 09/10/2006   ROSACEA 09/10/2006   Degeneration of lumbar or lumbosacral intervertebral disc 09/10/2006   OSA (obstructive sleep apnea) 09/10/2006   COLONIC POLYPS, HX OF 09/10/2006   Type II diabetes mellitus with renal manifestations ([place]) 09/06/2006   Past Medical History:  Diagnosis Date   Adenomatous colon polyp 2003   Benign neoplasm of colon    Carpal tunnel syndrome    much better   Chest pain, unspecified    CHF (congestive heart failure) (HCC)    diastolic   Complication of anesthesia    careful in positioning due to back pain   Degeneration of intervertebral disc, site unspecified    Diabetes mellitus    type II   Encounter for long-term (current) use of other medications    Fluid overload    GERD (gastroesophageal reflux disease)    Heart disease, unspecified    History of kidney stones    with stent/hospitalized   Hyperlipidemia    Hypertension    Obesity, unspecified    Rosacea    Shortness of breath  Unspecified asthma(493.90)    Unspecified sleep apnea    no cpap/ sleeps sitting up   Past Surgical History:  Procedure Laterality Date   ABDOMINAL HYSTERECTOMY     partial, fibroids   BOTOX INJECTION N/A 02/09/2020   Procedure: BOTOX INJECTION;  Surgeon: Hollice Espy, MD;  Location: [facility] ORS;  Service: Urology;  Laterality: N/A;   CYSTOSCOPY WITH STENT PLACEMENT Left 02/18/2019   Procedure: CYSTOSCOPY WITH STENT PLACEMENT;  Surgeon: Abbie Sons, MD;  Location: [facility] ORS;  Service: Urology;  Laterality: Left;   CYSTOSCOPY/URETEROSCOPY/HOLMIUM LASER/STENT PLACEMENT Left 04/01/2019   Procedure: CYSTOSCOPY/URETEROSCOPY/HOLMIUM LASER/STENT PLACEMENT;  Surgeon: Abbie Sons, MD;  Location: [facility] ORS;  Service: Urology;  Laterality: Left;   ESOPHAGOGASTRODUODENOSCOPY  2000    TONSILLECTOMY     UMBILICAL HERNIA REPAIR  01/2004   URETERAL STENT PLACEMENT  2/13   for L sided stone/ ecoli sepsis   Social History   Tobacco Use   Smoking status: Never    Passive exposure: Past   Smokeless tobacco: Never  Vaping Use   Vaping Use: Never used  Substance Use Topics   Alcohol use: No    Alcohol/week: 0.0 standard drinks of alcohol   Drug use: No   Family History  Problem Relation Age of Onset   Early death Father 40       train accident   Diabetes Mother    Heart disease Mother    Diabetes Sister    Diabetes Brother    Colon cancer Brother    Cancer Paternal Grandmother        unknown   Diabetes Sister    Allergies  Allergen Reactions   Rofecoxib     REACTION: reaction not known   Antihistamines, Chlorpheniramine-Type Palpitations   Sulfa Antibiotics Rash   Tetanus-Diphtheria Toxoids Td Other (See Comments)    REACTION: sick with fever   Current Outpatient Medications on File Prior to Visit  Medication Sig Dispense Refill   Accu-Chek Softclix Lancets lancets USE AS DIRECTED TO CHECK BLOOD SUGAR ONCE DAILY AND AS NEEDED. 100 each 3   Aspirin-Caffeine (BC FAST PAIN RELIEF PO) Take 1 packet by mouth daily as needed (headache).     Cholecalciferol (VITAMIN D3) 50 MCG (2000 UT) capsule Take 1 capsule (2,000 Units total) by mouth daily. 90 capsule 3   furosemide (LASIX) 40 MG tablet TAKE 1 TABLET BY MOUTH DAILY 100 tablet 2   glipiZIDE (GLUCOTROL XL) 10 MG 24 hr tablet TAKE 1 TABLET BY MOUTH DAILY  WITH BREAKFAST 100 tablet 2   glucose blood (ACCU-CHEK AVIVA PLUS) test strip USE AS DIRECTED TO CHECK BLOOD SUGAR ONCE DAILY AND AS NEEDED. 90 each 3   losartan (COZAAR) 50 MG tablet TAKE 1 TABLET BY MOUTH DAILY 100 tablet 2   metFORMIN (GLUCOPHAGE) 1000 MG tablet TAKE 1 TABLET BY MOUTH TWICE  DAILY WITH A MEAL 200 tablet 2   naproxen sodium (ALEVE) 220 MG tablet Take 220 mg by mouth daily as needed (Pain).      omeprazole (PRILOSEC) 20 MG capsule TAKE 1  CAPSULE BY MOUTH DAILY 100 capsule 2   Pseudoephedrine HCl (SINUS & ALLERGY 12 HOUR PO) Take 1 tablet by mouth daily.      simvastatin (ZOCOR) 40 MG tablet TAKE 1 TABLET BY MOUTH AT  BEDTIME 100 tablet 2   triamcinolone cream (KENALOG) 0.1 % APPLY TO AFFECTED AREA(S)  TOPICALLY TWO TIMES A DAY 30 g 1   No current facility-administered medications on file prior  to visit.    Review of Systems  Constitutional:  Negative for activity change, appetite change, fatigue, fever and unexpected weight change.  HENT:  Negative for congestion, ear pain, rhinorrhea, sinus pressure and sore throat.   Eyes:  Negative for pain, redness and visual disturbance.  Respiratory:  Negative for cough, shortness of breath and wheezing.   Cardiovascular:  Negative for chest pain and palpitations.  Gastrointestinal:  Negative for abdominal pain, blood in stool, constipation, diarrhea and nausea.  Endocrine: Negative for polydipsia and polyuria.  Genitourinary:  Negative for dysuria, frequency and urgency.  Musculoskeletal:  Positive for neck pain and neck stiffness. Negative for arthralgias, back pain and myalgias.  Skin:  Negative for pallor and rash.  Allergic/Immunologic: Negative for environmental allergies.  Neurological:  Positive for headaches. Negative for dizziness and syncope.  Hematological:  Negative for adenopathy. Does not bruise/bleed easily.  Psychiatric/Behavioral:  Negative for decreased concentration and dysphoric mood. The patient is not nervous/anxious.        Objective:   Physical Exam Constitutional:      General: She is not in acute distress.    Appearance: She is well-developed. She is obese. She is not ill-appearing or diaphoretic.  HENT:     Head: Normocephalic and atraumatic.     Right Ear: External ear normal.     Left Ear: External ear normal.     Nose: Nose normal.     Mouth/Throat:     Pharynx: No oropharyngeal exudate.  Eyes:     General: No scleral icterus.       Right  eye: No discharge.        Left eye: No discharge.     Conjunctiva/sclera: Conjunctivae normal.     Pupils: Pupils are equal, round, and reactive to light.     Comments: No nystagmus  Neck:     Thyroid: No thyromegaly.     Vascular: No carotid bruit or JVD.     Trachea: No tracheal deviation.     Comments: Tenderness in bilat trapezius areas  No bony tenderness  Limited extension  Limited R rotation (L hurts but can do) No crepitus  Cardiovascular:     Rate and Rhythm: Normal rate and regular rhythm.     Heart sounds: Normal heart sounds. No murmur heard. Pulmonary:     Effort: Pulmonary effort is normal. No respiratory distress.     Breath sounds: Normal breath sounds. No wheezing or rales.  Abdominal:     General: Bowel sounds are normal. There is no distension.     Palpations: Abdomen is soft. There is no mass.     Tenderness: There is no abdominal tenderness.  Musculoskeletal:     Cervical back: Full passive range of motion without pain, normal range of motion and neck supple. Tenderness present. No rigidity.  Lymphadenopathy:     Cervical: No cervical adenopathy.  Skin:    General: Skin is warm and dry.     Coloration: Skin is not pale.     Findings: No rash.  Neurological:     Mental Status: She is alert and oriented to person, place, and time.     Cranial Nerves: Cranial nerves 2-12 are intact. No cranial nerve deficit, dysarthria or facial asymmetry.     Sensory: Sensation is intact. No sensory deficit.     Motor: No weakness, tremor, atrophy, abnormal muscle tone, seizure activity or pronator drift.     Coordination: Coordination is intact. Coordination normal. Finger-Nose-Finger Test normal.  Gait: Gait normal.     Deep Tendon Reflexes: Reflexes are normal and symmetric. Reflexes normal.     Comments: No focal cerebellar signs   Psychiatric:        Behavior: Behavior normal.        Thought Content: Thought content normal.           Assessment & Plan:    Problem List Items Addressed This Visit       Other   Head pain - Primary    Head pain /ache for at least 9 months , worse on L and now with pain around L ear  No photo or phonia or nausea  In process of working up for OSA  Has h/o significant DJD of neck   Disc poss that this is related to neck problem Also need to rule out Temp arteritis   Xray CS today Lab for esr and crp  Trial of prednisone taper to break cycle (30 mg , she does not tol well in past will stay low) Consider head imaging if not improved  Enc fluid intake Watch caffeine  Try for supportive pillow      Relevant Orders   DG Cervical Spine Complete   Sedimentation Rate   C-reactive protein   CBC with Differential/Platelet   Neck pain    Per pt significant DJD of CS in the past  Now stiff/rom is less This could worsen her HA  Xray today  Then disc opt for tx  Hesitant to use muscle relaxer in light of fall risk  Enc her to try heat for 10 min PT may be a good option depending on results       Relevant Orders   DG Cervical Spine Complete   Neck stiffness    Reduced rom due to chronic pain

## 2022-02-13 NOTE — Assessment & Plan Note (Signed)
Per pt significant DJD of CS in the past  Now stiff/rom is less This could worsen her HA  Xray today  Then disc opt for tx  Hesitant to use muscle relaxer in light of fall risk  Enc her to try heat for 10 min PT may be a good option depending on results

## 2022-02-13 NOTE — Assessment & Plan Note (Signed)
Head pain /ache for at least 9 months , worse on L and now with pain around L ear  No photo or phonia or nausea  In process of working up for OSA  Has h/o significant DJD of neck   Disc poss that this is related to neck problem Also need to rule out Temp arteritis   Xray CS today Lab for esr and crp  Trial of prednisone taper to break cycle (30 mg , she does not tol well in past will stay low) Consider head imaging if not improved  Enc fluid intake Watch caffeine  Try for supportive pillow

## 2022-02-14 ENCOUNTER — Telehealth: Payer: Self-pay | Admitting: *Deleted

## 2022-02-14 NOTE — Telephone Encounter (Signed)
Left VM requesting pt to call the office back 

## 2022-02-14 NOTE — Telephone Encounter (Signed)
-----   Message from Abner Greenspan, MD sent at 02/13/2022  8:21 PM EST ----- Your labs look ok, no signs of temporal arteritis  Reassuring  Pending xray reading next

## 2022-02-15 NOTE — Telephone Encounter (Signed)
Pt called office and pt notified as instructed for this phone note as well as CS results and voiced understanding;  sent CS results back to Dr Glori Bickers.

## 2022-02-15 NOTE — Telephone Encounter (Signed)
[place] Night - Client Nonclinical Telephone Record  [facility] Client [place] Night - Client Client Site [place] Provider Loura Pardon - MD Contact Type Call Who Is Calling Patient / Member / Family / Caregiver Caller Name [place] Phone Number [phone] Call Type Message Only Information Provided Reason for Call Returning a Call from the Office Initial [place] states they missed a call. Additional Comment Office hours provided. Disp. Time Disposition Final User 02/14/2022 4:59:54 PM General Information Provided Yes Sheets, Ciara Call Closed By: Arminda Resides Transaction Date/Time: 02/14/2022 4:58:32 PM (ET

## 2022-02-20 ENCOUNTER — Telehealth: Payer: Self-pay

## 2022-02-20 NOTE — Telephone Encounter (Signed)
Some signs of arthritis (in end plate and facet joint areas of the vertebrae)  Also some disc space narrowing- this means discs may be flattening out between the vertebrae as well  All of this can lead to pinched nerves  The prednisone helps decrease inflammation short term   So glad you don't have pain right now   Physical therapy may help in the future = let me know if you would like to do this  If things worsen I would have you see an orthopedic specialist

## 2022-02-20 NOTE — Telephone Encounter (Signed)
Patient called back in wanting further explanation on her xray results. I advised again of Dr. Alba Cory message, and she said she wants to know what to do about the degenerative changes now. Is there anything further she needs to do, or just keep going on as is?  She is not having any more head ache or pain right now, I advised you want her to let us know if that returns.

## 2022-02-20 NOTE — Telephone Encounter (Signed)
Notified patient of Dr. Alba Cory message. She will let Gabriella Howe know if she starts feeling any pain or worsening symptoms. Also will let Gabriella Howe know if she decides she wants to do PT.

## 2022-07-07 ENCOUNTER — Other Ambulatory Visit: Payer: Self-pay | Admitting: Family Medicine

## 2022-07-13 ENCOUNTER — Telehealth: Payer: Self-pay | Admitting: Family Medicine

## 2022-07-13 NOTE — Telephone Encounter (Signed)
Contacted Gabriella Howe to schedule their annual wellness visit. Appointment made for 07/26/2022.  [facility] Care Guide [facility] AWV TEAM Direct Dial: [phone]

## 2022-07-18 DIAGNOSIS — E119 Type 2 diabetes mellitus without complications: Secondary | ICD-10-CM | POA: Diagnosis not present

## 2022-07-18 DIAGNOSIS — Z961 Presence of intraocular lens: Secondary | ICD-10-CM | POA: Diagnosis not present

## 2022-07-18 LAB — HM DIABETES EYE EXAM

## 2022-07-26 ENCOUNTER — Ambulatory Visit (INDEPENDENT_AMBULATORY_CARE_PROVIDER_SITE_OTHER): Payer: Medicare Other

## 2022-07-26 VITALS — Ht 60.0 in | Wt 210.0 lb

## 2022-07-26 DIAGNOSIS — Z0000 Encounter for general adult medical examination without abnormal findings: Secondary | ICD-10-CM | POA: Diagnosis not present

## 2022-07-26 NOTE — Patient Instructions (Addendum)
Gabriella Howe , Thank you for taking time to come for your Medicare Wellness Visit. I appreciate your ongoing commitment to your health goals. Please review the following plan we discussed and let me know if I can assist you in the future.   These are the goals we discussed:  Goals       DIET - EAT MORE FRUITS AND VEGETABLES      DIET - INCREASE WATER INTAKE      Starting 05/06/2018, I will attempt to drink at least 6 glasses of water daily and to continue to make healthy food choices.       Patient Stated      05/08/2019, I will maintain and continue medications as prescribed.      Patient Stated      08/03/2020, I will maintain and continue medications as prescribed.       Patient Stated (pt-stated)      Stay active      Pharmacy Care Plan      CARE PLAN ENTRY  Current Barriers:  Chronic Disease Management support, education, and care coordination needs related to type 2 diabetes, heart failure, hypertension, hyperlipidemia, GERD  Pharmacist Clinical Goal(s):  Continue current medications as prescribed.  Improve blood glucose control with A1c goal of less than 7%.  Maintain blood pressure within goal of less than 140/90 mmHg. Recommend a home blood pressure monitor. Remain up to date on vaccinations. Recommend shingles vaccines (Shingrix) two-dose series from local pharmacy. Recommend safe over the counter medication use. Consider daily antihistamine such as cetirizine or loratadine and using Flonase nasal spray for additional  congestion as an alternative to daily pseudoephedrine.   Interventions: Comprehensive medication review performed.  Patient Self Care Activities:  Self administers medications as prescribed  Initial goal documentation        This is a list of the screening recommended for you and due dates:  Health Maintenance  Topic Date Due   DTaP/Tdap/Td vaccine (1 - Tdap) Never done   Zoster (Shingles) Vaccine (1 of 2) Never done   Yearly kidney health  urinalysis for diabetes  01/13/2009   Hemoglobin A1C  04/21/2022   Complete foot exam   05/12/2022   COVID-19 Vaccine (5 - 2023-24 season) 06/22/2022   Medicare Annual Wellness Visit  08/05/2022   Yearly kidney function blood test for diabetes  10/20/2022   Flu Shot  10/26/2022   Eye exam for diabetics  07/18/2023   Pneumonia Vaccine  Completed   DEXA scan (bone density measurement)  Completed   HPV Vaccine  Aged Out    Advanced directives: yes  Conditions/risks identified:Aim for 30 minutes of exercise or brisk walking, 6-8 glasses of water, and 5 servings of fruits and vegetables each day.     Next appointment: Follow up in one year for your annual wellness visit 07/30/23 @ 10:30   Preventive Care 65 Years and Older, Female Preventive care refers to lifestyle choices and visits with your health care provider that can promote health and wellness. What does preventive care include? A yearly physical exam. This is also called an annual well check. Dental exams once or twice a year. Routine eye exams. Ask your health care provider how often you should have your eyes checked. Personal lifestyle choices, including: Daily care of your teeth and gums. Regular physical activity. Eating a healthy diet. Avoiding tobacco and drug use. Limiting alcohol use. Practicing safe sex. Taking low-dose aspirin every day. Taking vitamin and mineral supplements as recommended  by your health care provider. What happens during an annual well check? The services and screenings done by your health care provider during your annual well check will depend on your age, overall health, lifestyle risk factors, and family history of disease. Counseling  Your health care provider may ask you questions about your: Alcohol use. Tobacco use. Drug use. Emotional well-being. Home and relationship well-being. Sexual activity. Eating habits. History of falls. Memory and ability to understand  (cognition). Work and work Astronomer. Reproductive health. Screening  You may have the following tests or measurements: Height, weight, and BMI. Blood pressure. Lipid and cholesterol levels. These may be checked every 5 years, or more frequently if you are over 81 years old. Skin check. Lung cancer screening. You may have this screening every year starting at age 93 if you have a 30-pack-year history of smoking and currently smoke or have quit within the past 15 years. Fecal occult blood test (FOBT) of the stool. You may have this test every year starting at age 46. Flexible sigmoidoscopy or colonoscopy. You may have a sigmoidoscopy every 5 years or a colonoscopy every 10 years starting at age 69. Hepatitis C blood test. Hepatitis B blood test. Sexually transmitted disease (STD) testing. Diabetes screening. This is done by checking your blood sugar (glucose) after you have not eaten for a while (fasting). You may have this done every 1-3 years. Bone density scan. This is done to screen for osteoporosis. You may have this done starting at age 29. Mammogram. This may be done every 1-2 years. Talk to your health care provider about how often you should have regular mammograms. Talk with your health care provider about your test results, treatment options, and if necessary, the need for more tests. Vaccines  Your health care provider may recommend certain vaccines, such as: Influenza vaccine. This is recommended every year. Tetanus, diphtheria, and acellular pertussis (Tdap, Td) vaccine. You may need a Td booster every 10 years. Zoster vaccine. You may need this after age 35. Pneumococcal 13-valent conjugate (PCV13) vaccine. One dose is recommended after age 78. Pneumococcal polysaccharide (PPSV23) vaccine. One dose is recommended after age 59. Talk to your health care provider about which screenings and vaccines you need and how often you need them. This information is not intended to  replace advice given to you by your health care provider. Make sure you discuss any questions you have with your health care provider. Document Released: 04/09/2015 Document Revised: 12/01/2015 Document Reviewed: 01/12/2015 Elsevier Interactive Patient Education  2017 [employer].  Fall Prevention in the Home Falls can cause injuries. They can happen to people of all ages. There are many things you can do to make your home safe and to help prevent falls. What can I do on the outside of my home? Regularly fix the edges of walkways and driveways and fix any cracks. Remove anything that might make you trip as you walk through a door, such as a raised step or threshold. Trim any bushes or trees on the path to your home. Use bright outdoor lighting. Clear any walking paths of anything that might make someone trip, such as rocks or tools. Regularly check to see if handrails are loose or broken. Make sure that both sides of any steps have handrails. Any raised decks and porches should have guardrails on the edges. Have any leaves, snow, or ice cleared regularly. Use sand or salt on walking paths during winter. Clean up any spills in your garage right away.  This includes oil or grease spills. What can I do in the bathroom? Use night lights. Install grab bars by the toilet and in the tub and shower. Do not use towel bars as grab bars. Use non-skid mats or decals in the tub or shower. If you need to sit down in the shower, use a plastic, non-slip stool. Keep the floor dry. Clean up any water that spills on the floor as soon as it happens. Remove soap buildup in the tub or shower regularly. Attach bath mats securely with double-sided non-slip rug tape. Do not have throw rugs and other things on the floor that can make you trip. What can I do in the bedroom? Use night lights. Make sure that you have a light by your bed that is easy to reach. Do not use any sheets or blankets that are too big for  your bed. They should not hang down onto the floor. Have a firm chair that has side arms. You can use this for support while you get dressed. Do not have throw rugs and other things on the floor that can make you trip. What can I do in the kitchen? Clean up any spills right away. Avoid walking on wet floors. Keep items that you use a lot in easy-to-reach places. If you need to reach something above you, use a strong step stool that has a grab bar. Keep electrical cords out of the way. Do not use floor polish or wax that makes floors slippery. If you must use wax, use non-skid floor wax. Do not have throw rugs and other things on the floor that can make you trip. What can I do with my stairs? Do not leave any items on the stairs. Make sure that there are handrails on both sides of the stairs and use them. Fix handrails that are broken or loose. Make sure that handrails are as long as the stairways. Check any carpeting to make sure that it is firmly attached to the stairs. Fix any carpet that is loose or worn. Avoid having throw rugs at the top or bottom of the stairs. If you do have throw rugs, attach them to the floor with carpet tape. Make sure that you have a light switch at the top of the stairs and the bottom of the stairs. If you do not have them, ask someone to add them for you. What else can I do to help prevent falls? Wear shoes that: Do not have high heels. Have rubber bottoms. Are comfortable and fit you well. Are closed at the toe. Do not wear sandals. If you use a stepladder: Make sure that it is fully opened. Do not climb a closed stepladder. Make sure that both sides of the stepladder are locked into place. Ask someone to hold it for you, if possible. Clearly mark and make sure that you can see: Any grab bars or handrails. First and last steps. Where the edge of each step is. Use tools that help you move around (mobility aids) if they are needed. These  include: Canes. Walkers. Scooters. Crutches. Turn on the lights when you go into a dark area. Replace any light bulbs as soon as they burn out. Set up your furniture so you have a clear path. Avoid moving your furniture around. If any of your floors are uneven, fix them. If there are any pets around you, be aware of where they are. Review your medicines with your doctor. Some medicines can make you feel  dizzy. This can increase your chance of falling. Ask your doctor what other things that you can do to help prevent falls. This information is not intended to replace advice given to you by your health care provider. Make sure you discuss any questions you have with your health care provider. Document Released: 01/07/2009 Document Revised: 08/19/2015 Document Reviewed: 04/17/2014 Elsevier Interactive Patient Education  2017 [employer].

## 2022-07-26 NOTE — Progress Notes (Signed)
I connected with  Gabriella Howe on 07/26/22 by a audio enabled telemedicine application and verified that I am speaking with the correct person using two identifiers.  Patient Location: Home  Provider Location: Office/Clinic  I discussed the limitations of evaluation and management by telemedicine. The patient expressed understanding and agreed to proceed.  Subjective:   Gabriella Howe is a 88 y.o. female who presents for Medicare Annual (Subsequent) preventive examination.  Review of Systems     Cardiac Risk Factors include: advanced age (>66men, >27 women);diabetes mellitus;hypertension;sedentary lifestyle     Objective:    Today's Vitals   07/26/22 0844 07/26/22 1133  Weight:  210 lb (95.3 kg)  Height:  5' (1.524 m)  PainSc: 3     Body mass index is 41.01 kg/m.     07/26/2022   11:14 AM 08/04/2021   11:28 AM 10/23/2020   11:31 PM 10/23/2020   11:29 PM 10/23/2020   11:31 AM 08/03/2020   12:01 PM 02/09/2020    6:49 AM  Advanced Directives  Does Patient Have a Medical Advance Directive? Yes No Yes Yes No Yes Yes  Type of Estate agent of [place];Living will  Healthcare Power of [employer] [place];Living will Healthcare Power of [place];Living will  Does patient want to make changes to medical advance directive?   No - Patient declined No - Patient declined   No - Patient declined  Copy of Healthcare Power of Attorney in Chart? No - copy requested  Yes - validated most recent copy scanned in chart (See row information)   No - copy requested Yes - validated most recent copy scanned in chart (See row information)  Would patient like information on creating a medical advance directive?  No - Patient declined     No - Patient declined    Current Medications (verified) Outpatient Encounter Medications as of 07/26/2022  Medication Sig   Accu-Chek Softclix Lancets lancets USE AS DIRECTED TO CHECK BLOOD SUGAR ONCE DAILY AND AS NEEDED.    Cholecalciferol (VITAMIN D3) 50 MCG (2000 UT) capsule Take 1 capsule (2,000 Units total) by mouth daily.   Cyanocobalamin (B-12 PO) Take by mouth.   furosemide (LASIX) 40 MG tablet TAKE 1 TABLET BY MOUTH DAILY   glipiZIDE (GLUCOTROL XL) 10 MG 24 hr tablet TAKE 1 TABLET BY MOUTH DAILY  WITH BREAKFAST   glucose blood (ACCU-CHEK AVIVA PLUS) test strip USE AS DIRECTED TO CHECK BLOOD SUGAR ONCE DAILY AND AS NEEDED.   losartan (COZAAR) 50 MG tablet TAKE 1 TABLET BY MOUTH DAILY   metFORMIN (GLUCOPHAGE) 1000 MG tablet TAKE 1 TABLET BY MOUTH TWICE  DAILY WITH A MEAL   Multiple Vitamin (MULTIVITAMIN) capsule Take 1 capsule by mouth daily.   naproxen sodium (ALEVE) 220 MG tablet Take 220 mg by mouth daily as needed (Pain).    omeprazole (PRILOSEC) 20 MG capsule TAKE 1 CAPSULE BY MOUTH DAILY   Pseudoephedrine HCl (SINUS & ALLERGY 12 HOUR PO) Take 1 tablet by mouth daily.    simvastatin (ZOCOR) 40 MG tablet TAKE 1 TABLET BY MOUTH AT  BEDTIME   triamcinolone cream (KENALOG) 0.1 % APPLY TO AFFECTED AREA(S)  TOPICALLY TWO TIMES A DAY   Aspirin-Caffeine (BC FAST PAIN RELIEF PO) Take 1 packet by mouth daily as needed (headache). (Patient not taking: Reported on 07/26/2022)   predniSONE (DELTASONE) 10 MG tablet Take 3 pills once daily by mouth for 3 days, then 2 pills once daily for 3  days, then 1 pill once daily for 3 days and then stop   No facility-administered encounter medications on file as of 07/26/2022.    Allergies (verified) Rofecoxib; Antihistamines, chlorpheniramine-type; Sulfa antibiotics; and Tetanus-diphtheria toxoids td   History: Past Medical History:  Diagnosis Date   Adenomatous colon polyp 2003   Benign neoplasm of colon    Carpal tunnel syndrome    much better   Chest pain, unspecified    CHF (congestive heart failure) (HCC)    diastolic   Complication of anesthesia    careful in positioning due to back pain   Degeneration of intervertebral disc, site unspecified    Diabetes mellitus     type II   Encounter for long-term (current) use of other medications    Fluid overload    GERD (gastroesophageal reflux disease)    Heart disease, unspecified    History of kidney stones    with stent/hospitalized   Hyperlipidemia    Hypertension    Obesity, unspecified    Rosacea    Shortness of breath    Unspecified asthma(493.90)    Unspecified sleep apnea    no cpap/ sleeps sitting up   Past Surgical History:  Procedure Laterality Date   ABDOMINAL HYSTERECTOMY     partial, fibroids   BOTOX INJECTION N/A 02/09/2020   Procedure: BOTOX INJECTION;  Surgeon: Vanna Scotland, MD;  Location: [facility];  Service: Urology;  Laterality: N/A;   CYSTOSCOPY WITH STENT PLACEMENT Left 02/18/2019   Procedure: CYSTOSCOPY WITH STENT PLACEMENT;  Surgeon: Riki Altes, MD;  Location: [facility];  Service: Urology;  Laterality: Left;   CYSTOSCOPY/URETEROSCOPY/HOLMIUM LASER/STENT PLACEMENT Left 04/01/2019   Procedure: CYSTOSCOPY/URETEROSCOPY/HOLMIUM LASER/STENT PLACEMENT;  Surgeon: Riki Altes, MD;  Location: [facility];  Service: Urology;  Laterality: Left;   ESOPHAGOGASTRODUODENOSCOPY  2000   TONSILLECTOMY     UMBILICAL HERNIA REPAIR  01/2004   URETERAL STENT PLACEMENT  2/13   for L sided stone/ ecoli sepsis   Family History  Problem Relation Age of Onset   Early death Father 59       train accident   Diabetes Mother    Heart disease Mother    Diabetes Sister    Diabetes Brother    Colon cancer Brother    Cancer Paternal Grandmother        unknown   Diabetes Sister    Social History   Socioeconomic History   Marital status: Widowed    Spouse name: Not on file   Number of children: Not on file   Years of education: Not on file   Highest education level: Not on file  Occupational History   Not on file  Tobacco Use   Smoking status: Never    Passive exposure: Past   Smokeless tobacco: Never  Vaping Use   Vaping Use: Never used  Substance and Sexual Activity   Alcohol  use: No    Alcohol/week: 0.0 standard drinks of alcohol   Drug use: No   Sexual activity: Never  Other Topics Concern   Not on file  Social History Narrative   Not on file   Social Determinants of Health   Financial Resource Strain: Low Risk  (07/26/2022)   Overall Financial Resource Strain (CARDIA)    Difficulty of Paying Living Expenses: Not hard at all  Food Insecurity: No Food Insecurity (07/26/2022)   Hunger Vital Sign    Worried About Running Out of Food in the Last Year: Never true  Ran Out of Food in the Last Year: Never true  Transportation Needs: No Transportation Needs (07/26/2022)   PRAPARE - Administrator, Civil Service (Medical): No    Lack of Transportation (Non-Medical): No  Physical Activity: Inactive (07/26/2022)   Exercise Vital Sign    Days of Exercise per Week: 0 days    Minutes of Exercise per Session: 0 min  Stress: No Stress Concern Present (07/26/2022)   [employer] of Occupational Health - Occupational Stress Questionnaire    Feeling of Stress : Not at all  Social Connections: Moderately Isolated (07/26/2022)   Social Connection and Isolation Panel [NHANES]    Frequency of Communication with Friends and Family: More than three times a week    Frequency of Social Gatherings with Friends and Family: More than three times a week    Attends Religious Services: More than 4 times per year    Active Member of [employer] or Organizations: No    Attends Banker Meetings: Never    Marital Status: Widowed    Tobacco Counseling Counseling given: Not Answered   Clinical Intake:  Pre-visit preparation completed: Yes  Pain : 0-10 Pain Score: 3  (arthritis) Pain Type: Chronic pain Pain Location: Generalized (back and leg) Pain Descriptors / Indicators: Constant (arthritis) Pain Onset: More than a month ago Pain Frequency: Several days a week     Nutritional Risks: None Diabetes: Yes CBG done?: No Did pt. bring in CBG monitor from  home?: No  How often do you need to have someone help you when you read instructions, pamphlets, or other written materials from your doctor or pharmacy?: 1 - Never  Diabetic?yes  Interpreter Needed?: No  Comments: lives alone Information entered by :: p. Foy CMANutrition Risk Assessment:  Has the patient had any N/V/D within the last 2 months?  No  Does the patient have any non-healing wounds?  No  Has the patient had any unintentional weight loss or weight gain?  No   Diabetes:  Is the patient diabetic?  Yes  If diabetic, was a CBG obtained today?  No  Did the patient bring in their glucometer from home?  No  How often do you monitor your CBG's? Does not.   Financial Strains and Diabetes Management:  Are you having any financial strains with the device, your supplies or your medication? No .  Does the patient want to be seen by Chronic Care Management for management of their diabetes?  No  Would the patient like to be referred to a Nutritionist or for Diabetic Management?  No   Diabetic Exams:  Diabetic Eye Exam: Completed 07/18/22 Diabetic Foot Exam: Completed 05/12/21     Activities of Daily Living    07/26/2022    8:44 AM 08/04/2021   11:29 AM  In your present state of health, do you have any difficulty performing the following activities:  Hearing? 0 0  Vision? 1 0  Difficulty concentrating or making decisions? 1 0  Walking or climbing stairs? 1 0  Dressing or bathing? 1 0  Doing errands, shopping? 1 0  Preparing Food and eating ? N N  Using the Toilet? N N  In the past six months, have you accidently leaked urine? Y N  Do you have problems with loss of bowel control? Y N  Managing your Medications? N N  Managing your Finances? N N  Housekeeping or managing your Housekeeping? N N    Patient Care Team: [place], [place]  A, MD as PCP - General Vin-Parikh, Lavella Hammock, MD as Referring Physician (Ophthalmology) Seymour Bars, DDS as Referring Physician  (Dentistry) Phil Dopp, [facility] as Pharmacist (Pharmacist)  Indicate any recent Medical Services you may have received from other than [facility] providers in the past year (date may be approximate).     Assessment:   This is a routine wellness examination for Gabriella Howe.  Hearing/Vision screen Hearing Screening - Comments:: Adequate hearing Vision Screening - Comments:: Glasses- Dr. Brett Canales [facility]  Dietary issues and exercise activities discussed: Current Exercise Habits: The patient does not participate in regular exercise at present   Goals Addressed               This Visit's Progress     Patient Stated (pt-stated)        Stay active       Depression Screen    07/26/2022   11:13 AM 10/19/2021    2:34 PM 08/04/2021   11:26 AM 08/03/2020   12:03 PM 05/08/2019   10:36 AM 05/06/2018    9:49 AM 04/23/2017    8:36 AM  PHQ 2/9 Scores  PHQ - 2 Score 0 0 0 0 0 0 1  PHQ- 9 Score    0 0 0 1    Fall Risk    07/26/2022    8:44 AM 10/19/2021    2:34 PM 08/04/2021   11:29 AM 08/03/2020   12:02 PM 05/08/2019   10:34 AM  Fall Risk   Falls in the past year? 1 0 0 1 1  Comment     loss balance and fell  Number falls in past yr: 1  0 0 0  Injury with Fall? 0  0 0 0  Risk for fall due to :   No Fall Risks Impaired balance/gait;History of fall(s) Medication side effect  Follow up Falls evaluation completed;Education provided;Falls prevention discussed Falls evaluation completed Falls evaluation completed Falls evaluation completed;Falls prevention discussed Falls prevention discussed;Falls evaluation completed    FALL RISK PREVENTION PERTAINING TO THE HOME:  Any stairs in or around the home? Yes  If so, are there any without handrails? Yes  Home free of loose throw rugs in walkways, pet beds, electrical cords, etc? Yes  Adequate lighting in your home to reduce risk of falls? Yes   ASSISTIVE DEVICES UTILIZED TO PREVENT FALLS:  Life alert? Yes  Use of a cane, walker or w/c?  Yes  Grab bars in the bathroom? Yes  Shower chair or bench in shower? Yes  Elevated toilet seat or a handicapped toilet? Yes    Cognitive Function:    08/03/2020   12:05 PM 05/08/2019   10:41 AM 05/06/2018    9:49 AM 04/23/2017    8:25 AM 04/20/2016    1:05 PM  MMSE - Mini Mental State Exam  Orientation to time 5 5 5 5 5   Orientation to Place 5 5 5 5 5   Registration 3 3 3 3 3   Attention/ Calculation 5 5 0 0 0  Recall 3 3 2 3 2   Recall-comments   unable to recall 2 of 3 words  pt was unable to recall 1 of 3 words  Language- name 2 objects   0 0 0  Language- repeat 1 1 1 1 1   Language- follow 3 step command   3 3 3   Language- read & follow direction   0 0 0  Write a sentence   0 0 0  Copy design  0 0 0  Total score   19 20 19         07/26/2022   11:23 AM  6CIT Screen  What Year? 0 points  What month? 0 points  What time? 0 points  Count back from 20 0 points  Months in reverse 2 points  Repeat phrase 0 points  Total Score 2 points    Immunizations Immunization History  Administered Date(s) Administered   Covid-19, Mrna,Vaccine(Spikevax)43yrs and older 04/27/2022   Fluad Quad(high Dose 65+) 01/20/2020   Influenza Split 01/24/2011   Influenza Whole 05/10/2009   Influenza,inj,Quad PF,6+ Mos 11/28/2013, 04/14/2015, 04/20/2016, 01/26/2017, 05/06/2018   Influenza-Unspecified 04/27/2022   PFIZER(Purple Top)SARS-COV-2 Vaccination 05/02/2019, 05/23/2019, 03/16/2020   Pneumococcal Conjugate-13 11/28/2013   Pneumococcal Polysaccharide-23 03/28/2003    TDAP status: Due, Education has been provided regarding the importance of this vaccine. Advised may receive this vaccine at local pharmacy or Health Dept. Aware to provide a copy of the vaccination record if obtained from local pharmacy or Health Dept. Verbalized acceptance and understanding.  Flu Vaccine status: Up to date  Pneumococcal vaccine status: Up to date  Covid-19 vaccine status: Information provided on how to  obtain vaccines.   Qualifies for Shingles Vaccine? Yes   Zostavax completed Yes   Shingrix Completed?: No.    Education has been provided regarding the importance of this vaccine. Patient has been advised to call insurance company to determine out of pocket expense if they have not yet received this vaccine. Advised may also receive vaccine at local pharmacy or Health Dept. Verbalized acceptance and understanding.  Screening Tests Health Maintenance  Topic Date Due   DTaP/Tdap/Td (1 - Tdap) Never done   Zoster Vaccines- Shingrix (1 of 2) Never done   Diabetic kidney evaluation - Urine ACR  01/13/2009   HEMOGLOBIN A1C  04/21/2022   FOOT EXAM  05/12/2022   COVID-19 Vaccine (5 - 2023-24 season) 06/22/2022   Medicare Annual Wellness (AWV)  08/05/2022   Diabetic kidney evaluation - eGFR measurement  10/20/2022   INFLUENZA VACCINE  10/26/2022   OPHTHALMOLOGY EXAM  07/18/2023   Pneumonia Vaccine 78+ Years old  Completed   DEXA SCAN  Completed   HPV VACCINES  Aged Out    Health Maintenance  Health Maintenance Due  Topic Date Due   DTaP/Tdap/Td (1 - Tdap) Never done   Zoster Vaccines- Shingrix (1 of 2) Never done   Diabetic kidney evaluation - Urine ACR  01/13/2009   HEMOGLOBIN A1C  04/21/2022   FOOT EXAM  05/12/2022   COVID-19 Vaccine (5 - 2023-24 season) 06/22/2022   Medicare Annual Wellness (AWV)  08/05/2022    Colorectal cancer screening: Type of screening: Colonoscopy. Completed 09/05/2017. Repeat every 0 years aged out  Mammogram status: No longer required due to 03/28/99.     Lung Cancer Screening: (Low Dose CT Chest recommended if Age 26-80 years, 30 pack-year currently smoking OR have quit w/in 15years.) does not qualify.   Lung Cancer Screening Referral: no  Additional Screening:  Hepatitis C Screening: does not qualify; Completed   Vision Screening: Recommended annual ophthalmology exams for early detection of glaucoma and other disorders of the eye. Is the  patient up to date with their annual eye exam?  Yes  Who is the provider or what is the name of the office in which the patient attends annual eye exams? [facility] If pt is not established with a provider, would they like to be referred to a provider to establish care?  No .   Dental Screening: Recommended annual dental exams for proper oral hygiene  Community Resource Referral / Chronic Care Management: CRR required this visit?  No   CCM required this visit?  No      Plan:     I have personally reviewed and noted the following in the patient's chart:   Medical and social history Use of alcohol, tobacco or illicit drugs  Current medications and supplements including opioid prescriptions. Patient is not currently taking opioid prescriptions. Functional ability and status Nutritional status Physical activity Advanced directives List of other physicians Hospitalizations, surgeries, and ER visits in previous 12 months Vitals Screenings to include cognitive, depression, and falls Referrals and appointments  In addition, I have reviewed and discussed with patient certain preventive protocols, quality metrics, and best practice recommendations. A written personalized care plan for preventive services as well as general preventive health recommendations were provided to patient.     Maryan Puls, LPN   04/01/1094   Nurse Notes: none

## 2022-08-02 ENCOUNTER — Telehealth: Payer: Self-pay | Admitting: Family Medicine

## 2022-08-02 DIAGNOSIS — E1169 Type 2 diabetes mellitus with other specified complication: Secondary | ICD-10-CM

## 2022-08-02 DIAGNOSIS — I10 Essential (primary) hypertension: Secondary | ICD-10-CM

## 2022-08-02 DIAGNOSIS — E1122 Type 2 diabetes mellitus with diabetic chronic kidney disease: Secondary | ICD-10-CM

## 2022-08-02 NOTE — Telephone Encounter (Signed)
-----   Message from Alvina Chou sent at 07/26/2022  2:42 PM EDT ----- Regarding: Lab orders for Thursday, 5.9.24 Patient is scheduled for CPX labs, please order future labs, Thanks , Camelia Eng

## 2022-08-03 ENCOUNTER — Other Ambulatory Visit (INDEPENDENT_AMBULATORY_CARE_PROVIDER_SITE_OTHER): Payer: Medicare Other

## 2022-08-03 DIAGNOSIS — E1122 Type 2 diabetes mellitus with diabetic chronic kidney disease: Secondary | ICD-10-CM

## 2022-08-03 DIAGNOSIS — E1169 Type 2 diabetes mellitus with other specified complication: Secondary | ICD-10-CM | POA: Diagnosis not present

## 2022-08-03 DIAGNOSIS — I10 Essential (primary) hypertension: Secondary | ICD-10-CM | POA: Diagnosis not present

## 2022-08-03 DIAGNOSIS — E785 Hyperlipidemia, unspecified: Secondary | ICD-10-CM | POA: Diagnosis not present

## 2022-08-03 LAB — CBC WITH DIFFERENTIAL/PLATELET
Basophils Absolute: 0.1 10*3/uL (ref 0.0–0.1)
Basophils Relative: 0.8 % (ref 0.0–3.0)
Eosinophils Absolute: 0.2 10*3/uL (ref 0.0–0.7)
Eosinophils Relative: 3 % (ref 0.0–5.0)
HCT: 41.9 % (ref 36.0–46.0)
Hemoglobin: 14.2 g/dL (ref 12.0–15.0)
Lymphocytes Relative: 23 % (ref 12.0–46.0)
Lymphs Abs: 1.9 10*3/uL (ref 0.7–4.0)
MCHC: 33.9 g/dL (ref 30.0–36.0)
MCV: 90.3 fl (ref 78.0–100.0)
Monocytes Absolute: 0.4 10*3/uL (ref 0.1–1.0)
Monocytes Relative: 5.4 % (ref 3.0–12.0)
Neutro Abs: 5.6 10*3/uL (ref 1.4–7.7)
Neutrophils Relative %: 67.8 % (ref 43.0–77.0)
Platelets: 209 10*3/uL (ref 150.0–400.0)
RBC: 4.64 Mil/uL (ref 3.87–5.11)
RDW: 13.9 % (ref 11.5–15.5)
WBC: 8.2 10*3/uL (ref 4.0–10.5)

## 2022-08-03 LAB — COMPREHENSIVE METABOLIC PANEL
ALT: 12 U/L (ref 0–35)
AST: 14 U/L (ref 0–37)
Albumin: 4.3 g/dL (ref 3.5–5.2)
Alkaline Phosphatase: 65 U/L (ref 39–117)
BUN: 14 mg/dL (ref 6–23)
CO2: 24 mEq/L (ref 19–32)
Calcium: 9.6 mg/dL (ref 8.4–10.5)
Chloride: 106 mEq/L (ref 96–112)
Creatinine, Ser: 0.96 mg/dL (ref 0.40–1.20)
GFR: 53.87 mL/min — ABNORMAL LOW (ref >60.00)
Glucose, Bld: 180 mg/dL — ABNORMAL HIGH (ref 70–99)
Potassium: 3.9 mEq/L (ref 3.5–5.1)
Sodium: 142 mEq/L (ref 135–145)
Total Bilirubin: 1.2 mg/dL (ref 0.2–1.2)
Total Protein: 6.9 g/dL (ref 6.0–8.3)

## 2022-08-03 LAB — LIPID PANEL
Cholesterol: 120 mg/dL (ref 0–200)
HDL: 49.7 mg/dL (ref >39.00)
LDL Cholesterol: 45 mg/dL (ref 0–99)
NonHDL: 70.06
Total CHOL/HDL Ratio: 2
Triglycerides: 126 mg/dL (ref 0.0–149.0)
VLDL: 25.2 mg/dL (ref 0.0–40.0)

## 2022-08-03 LAB — TSH: TSH: 1.74 u[IU]/mL (ref 0.35–5.50)

## 2022-08-03 LAB — HEMOGLOBIN A1C: Hgb A1c MFr Bld: 8.2 % — ABNORMAL HIGH (ref 4.6–6.5)

## 2022-08-08 ENCOUNTER — Ambulatory Visit (INDEPENDENT_AMBULATORY_CARE_PROVIDER_SITE_OTHER): Payer: Medicare Other | Admitting: Family Medicine

## 2022-08-08 ENCOUNTER — Encounter: Payer: Self-pay | Admitting: *Deleted

## 2022-08-08 ENCOUNTER — Other Ambulatory Visit: Payer: Self-pay | Admitting: Radiology

## 2022-08-08 ENCOUNTER — Encounter: Payer: Self-pay | Admitting: Family Medicine

## 2022-08-08 VITALS — BP 126/80 | HR 80 | Temp 97.7°F | Ht 59.5 in | Wt 210.2 lb

## 2022-08-08 DIAGNOSIS — Z79899 Other long term (current) drug therapy: Secondary | ICD-10-CM | POA: Diagnosis not present

## 2022-08-08 DIAGNOSIS — Z7984 Long term (current) use of oral hypoglycemic drugs: Secondary | ICD-10-CM | POA: Diagnosis not present

## 2022-08-08 DIAGNOSIS — E1122 Type 2 diabetes mellitus with diabetic chronic kidney disease: Secondary | ICD-10-CM

## 2022-08-08 DIAGNOSIS — Z0000 Encounter for general adult medical examination without abnormal findings: Secondary | ICD-10-CM | POA: Diagnosis not present

## 2022-08-08 DIAGNOSIS — I5032 Chronic diastolic (congestive) heart failure: Secondary | ICD-10-CM

## 2022-08-08 DIAGNOSIS — N182 Chronic kidney disease, stage 2 (mild): Secondary | ICD-10-CM | POA: Diagnosis not present

## 2022-08-08 DIAGNOSIS — E1169 Type 2 diabetes mellitus with other specified complication: Secondary | ICD-10-CM

## 2022-08-08 DIAGNOSIS — E785 Hyperlipidemia, unspecified: Secondary | ICD-10-CM | POA: Diagnosis not present

## 2022-08-08 DIAGNOSIS — I10 Essential (primary) hypertension: Secondary | ICD-10-CM | POA: Diagnosis not present

## 2022-08-08 DIAGNOSIS — L989 Disorder of the skin and subcutaneous tissue, unspecified: Secondary | ICD-10-CM | POA: Diagnosis not present

## 2022-08-08 DIAGNOSIS — Z6841 Body Mass Index (BMI) 40.0 and over, adult: Secondary | ICD-10-CM

## 2022-08-08 LAB — MICROALBUMIN / CREATININE URINE RATIO
Creatinine,U: 101.7 mg/dL
Microalb Creat Ratio: 3.4 mg/g (ref 0.0–30.0)
Microalb, Ur: 3.4 mg/dL — ABNORMAL HIGH (ref 0.0–1.9)

## 2022-08-08 NOTE — Assessment & Plan Note (Signed)
Worse Lab Results  Component Value Date   HGBA1C 8.2 (H) 08/03/2022   Pt thinks this is due to diet high in sugar and plans ot change it  Metformin 1000 mg bid Glipizide xl 10 mg daily  F/u 3 mo  Microalb utd  May be candidate for GLP med if not improved  Will check glucose levels  Eye exam utd

## 2022-08-08 NOTE — Assessment & Plan Note (Signed)
Stable Unsure if rel to her dm  GFR of 53.8  Takes aleve- discussed risk of this and recommend change to tylenol Encouraged better water intake

## 2022-08-08 NOTE — Assessment & Plan Note (Signed)
bp in fair control at this time  BP Readings from Last 1 Encounters:  08/08/22 126/80   No changes needed Most recent labs reviewed  Disc lifstyle change with low sodium diet and exercise  Taking losartan 50 mg daily

## 2022-08-08 NOTE — Assessment & Plan Note (Signed)
Discussed how this problem influences overall health and the risks it imposes  Reviewed plan for weight loss with lower calorie diet (via better food choices and also portion control or program like weight watchers) and exercise building up to or more than 30 minutes 5 days per week including some aerobic activity   Would like to consider chair exercise

## 2022-08-08 NOTE — Assessment & Plan Note (Signed)
Disc goals for lipids and reasons to control them Rev last labs with pt Rev low sat fat diet in detail  LDL is well controlled with simvastatin 40 mg daily  Well tolerated  Trig improved Will continue to work on diet also

## 2022-08-08 NOTE — Patient Instructions (Addendum)
If you are interested in the new shingles vaccine (Shingrix) - call your local pharmacy to check on coverage and availability  If affordable, get on a wait list at your pharmacy to get the vaccine.   Add some strength training to your routine, this is important for bone and brain health and can reduce your risk of falls and help your body use insulin properly and regulate weight  Light weights, exercise bands , and internet videos are a good way to start  Yoga (chair or regular), machines , floor exercises or a gym with machines are also good options    A walker is helpful for poor balance  If you are falling at home use the walker  If you want to do some PT for balance let us know  A cane only helps pain   Aleve is harmful to kidneys  Tylenol is safer   Get back to a diabetic diet  Try to get most of your carbohydrates from produce (with the exception of white potatoes)  Eat less bread/pasta/rice/snack foods/cereals/sweets and other items from the middle of the grocery store (processed carbs)  I want you to see dermatology for the spot on your back I put the referral in  Please let us know if you don't hear in 1-2 weeks   If you want to address arthritis pain with orthopedics let us know   Follow up in 3 months for diabetes

## 2022-08-08 NOTE — Assessment & Plan Note (Signed)
Encouraged B12 supplementation  Also takes mvi

## 2022-08-08 NOTE — Assessment & Plan Note (Signed)
Macular lesion/mole on R back has irreg color and shape Ref to derm for further eval

## 2022-08-08 NOTE — Assessment & Plan Note (Signed)
Reviewed health habits including diet and exercise and skin cancer prevention Reviewed appropriate screening tests for age  Also reviewed health mt list, fam hx and immunization status , as well as social and family history   See HPI Labs reviewed and ordered Pt will get shingrix in pharmacy  No Td due to reaction in the past  Discussed options for chair exercise Declines breast or colon cancer screening Declines dexa  Discussed fall prevention  Eye exam utd  PHQ scoe of 0

## 2022-08-08 NOTE — Assessment & Plan Note (Signed)
Improved No symptoms or clinical changes  Takes arb

## 2022-08-08 NOTE — Progress Notes (Signed)
Subjective:    Patient ID: Gabriella Howe, female    DOB: [birth year], 88 y.o.   MRN: [id number]  HPI Here for health maintenance exam and to review chronic medical problems   Wt Readings from Last 3 Encounters:  08/08/22 210 lb 4 oz (95.4 kg)  07/26/22 210 lb (95.3 kg)  02/13/22 209 lb 8 oz (95 kg)   41.75 kg/m  Vitals:   08/08/22 0954  BP: 126/80  Pulse: 80  Temp: 97.7 F (36.5 C)  SpO2: 96%   Immunization History  Administered Date(s) Administered   Covid-19, Mrna,Vaccine(Spikevax)39yrs and older 04/27/2022   Fluad Quad(high Dose 65+) 01/20/2020   Influenza Split 01/24/2011   Influenza Whole 05/10/2009   Influenza,inj,Quad PF,6+ Mos 11/28/2013, 04/14/2015, 04/20/2016, 01/26/2017, 05/06/2018   Influenza-Unspecified 04/27/2022   PFIZER(Purple Top)SARS-COV-2 Vaccination 05/02/2019, 05/23/2019, 03/16/2020   Pneumococcal Conjugate-13 11/28/2013   Pneumococcal Polysaccharide-23 03/28/2003   Health Maintenance Due  Topic Date Due   DTaP/Tdap/Td (1 - Tdap) Never done   Diabetic kidney evaluation - Urine ACR  01/13/2009   FOOT EXAM  05/12/2022    Still working some  Most is through the mail Seldom goes to a house  Feels ok overall / good and bad days  Walks with a cane   She has a peadaler to use sitting  Climbs stairs all day  Not a lot of motivation    Pt declines mammogram - due to age  Declines colon cancer screening  (had colonoscopy 2019) - due to age   Declines zoster vaccine in past-now wants to get it    Dexa - declines   Falls- at last 2 (happens when feeding the cat) - no injuries  Fractures-none  Supplements vitamin D Exercise -not much  Lot of chronic pain - this lowers her motivation  Does feel safe at home   Does have steps     Tetanus shot - made her sick in the past / cannot take it   Eye exam was 06/2022   Mood     07/26/2022   11:13 AM 10/19/2021    2:34 PM 08/04/2021   11:26 AM 08/03/2020   12:03 PM 05/08/2019   10:36 AM   Depression screen PHQ 2/9  Decreased Interest 0 0 0 0 0  Down, Depressed, Hopeless 0 0 0 0 0  PHQ - 2 Score 0 0 0 0 0  Altered sleeping    0 0  Tired, decreased energy    0 0  Change in appetite    0 0  Feeling bad or failure about yourself     0 0  Trouble concentrating    0 0  Moving slowly or fidgety/restless    0 0  Suicidal thoughts    0 0  PHQ-9 Score    0 0  Difficult doing work/chores    Not difficult at all Not difficult at all   HTN bp is stable today  No cp or palpitations or headaches or edema  No side effects to medicines  BP Readings from Last 3 Encounters:  08/08/22 126/80  02/13/22 (!) 146/78  11/15/21 126/70    Losartan 50 mg daily   Also has DD   CKD Last metabolic panel Lab Results  Component Value Date   GLUCOSE 180 (H) 08/03/2022   NA 142 08/03/2022   K 3.9 08/03/2022   CL 106 08/03/2022   CO2 24 08/03/2022   BUN 14 08/03/2022   CREATININE 0.96 08/03/2022  GFRNONAA >60 10/29/2020   CALCIUM 9.6 08/03/2022   PHOS 3.4 06/07/2011   PROT 6.9 08/03/2022   ALBUMIN 4.3 08/03/2022   BILITOT 1.2 08/03/2022   ALKPHOS 65 08/03/2022   AST 14 08/03/2022   ALT 12 08/03/2022   ANIONGAP 11 10/29/2020   GFR is 53.8  Aleve is on med list   Takes aleve every day for pain 1-2     DM2 Lab Results  Component Value Date   HGBA1C 8.2 (H) 08/03/2022   This is up from 7.2  Eating a lot of sugar  Also chips and cream cheese  Occ nauseated in the am   Metformin 1000 mg bid -no diarrhea  Glipizide xl 10 mg daily   Had not been testing  Now started back  Fasting 170s-180s  Now eating better !- glucose is in lower 100s   Lab Results  Component Value Date   LABMICR See below: 02/26/2020   LABMICR See below: 02/17/2020   MICROALBUR 3.4 (H) 08/08/2022   MICROALBUR 0.2 01/14/2008    Lab Results  Component Value Date   WBC 8.2 08/03/2022   HGB 14.2 08/03/2022   HCT 41.9 08/03/2022   MCV 90.3 08/03/2022   PLT 209.0 08/03/2022        Hyperlipidemia Lab Results  Component Value Date   CHOL 120 08/03/2022   CHOL 113 10/19/2021   CHOL 118 05/05/2021   Lab Results  Component Value Date   HDL 49.70 08/03/2022   HDL 46.90 10/19/2021   HDL 48.80 05/05/2021   Lab Results  Component Value Date   LDLCALC 45 08/03/2022   LDLCALC 30 10/19/2021   LDLCALC 37 05/05/2021   Lab Results  Component Value Date   TRIG 126.0 08/03/2022   TRIG 180.0 (H) 10/19/2021   TRIG 160.0 (H) 05/05/2021   Lab Results  Component Value Date   CHOLHDL 2 08/03/2022   CHOLHDL 2 10/19/2021   CHOLHDL 2 05/05/2021   No results found for: "LDLDIRECT" Simvastatin 40 mg daily   GERD Takes omeprazole 20 mg daily   Lab Results  Component Value Date   VITAMINB12 223 10/19/2021  Takes B12 daily and mii  Patient Active Problem List   Diagnosis Date Noted   Skin lesion 08/08/2022   Head pain 02/13/2022   Neck pain 02/13/2022   Current use of proton pump inhibitor 10/19/2021   Kidney stones 11/02/2020   Asthma 10/23/2020   CKD (chronic kidney disease), stage II 10/23/2020   Pedal edema 08/05/2019   DJD (degenerative joint disease) 02/26/2019   Iron deficiency anemia 04/30/2017   History of shingles 10/03/2016   Routine general medical examination at a health care facility 04/14/2015   Knee pain 07/28/2014   Incomplete emptying of bladder 11/28/2012   Encounter for Medicare annual wellness exam 11/19/2012   History of kidney stones 11/19/2012   Fatigue 06/07/2011   DIASTOLIC HEART FAILURE, CHRONIC 07/29/2009   Hyperlipidemia associated with type 2 diabetes mellitus (HCC) 09/13/2006   Morbid obesity (HCC) 09/10/2006   CARPAL TUNNEL SYNDROME 09/10/2006   Essential hypertension 09/10/2006   DIASTOLIC DYSFUNCTION 09/10/2006   REACTIVE AIRWAY DISEASE 09/10/2006   GERD 09/10/2006   ROSACEA 09/10/2006   Degeneration of lumbar or lumbosacral intervertebral disc 09/10/2006   OSA (obstructive sleep apnea) 09/10/2006   COLONIC  POLYPS, HX OF 09/10/2006   Type II diabetes mellitus with renal manifestations (HCC) 09/06/2006   Past Medical History:  Diagnosis Date   Adenomatous colon polyp 2003   Benign  neoplasm of colon    Carpal tunnel syndrome    much better   Chest pain, unspecified    CHF (congestive heart failure) (HCC)    diastolic   Complication of anesthesia    careful in positioning due to back pain   Degeneration of intervertebral disc, site unspecified    Diabetes mellitus    type II   Encounter for long-term (current) use of other medications    Fluid overload    GERD (gastroesophageal reflux disease)    Heart disease, unspecified    History of kidney stones    with stent/hospitalized   Hyperlipidemia    Hypertension    Obesity, unspecified    Rosacea    Shortness of breath    Unspecified asthma(493.90)    Unspecified sleep apnea    no cpap/ sleeps sitting up   Past Surgical History:  Procedure Laterality Date   ABDOMINAL HYSTERECTOMY     partial, fibroids   BOTOX INJECTION N/A 02/09/2020   Procedure: BOTOX INJECTION;  Surgeon: Vanna Scotland, MD;  Location: [facility];  Service: Urology;  Laterality: N/A;   CYSTOSCOPY WITH STENT PLACEMENT Left 02/18/2019   Procedure: CYSTOSCOPY WITH STENT PLACEMENT;  Surgeon: Riki Altes, MD;  Location: [facility];  Service: Urology;  Laterality: Left;   CYSTOSCOPY/URETEROSCOPY/HOLMIUM LASER/STENT PLACEMENT Left 04/01/2019   Procedure: CYSTOSCOPY/URETEROSCOPY/HOLMIUM LASER/STENT PLACEMENT;  Surgeon: Riki Altes, MD;  Location: [facility];  Service: Urology;  Laterality: Left;   ESOPHAGOGASTRODUODENOSCOPY  2000   TONSILLECTOMY     UMBILICAL HERNIA REPAIR  01/2004   URETERAL STENT PLACEMENT  2/13   for L sided stone/ ecoli sepsis   Social History   Tobacco Use   Smoking status: Never    Passive exposure: Past   Smokeless tobacco: Never  Vaping Use   Vaping Use: Never used  Substance Use Topics   Alcohol use: No    Alcohol/week: 0.0  standard drinks of alcohol   Drug use: No   Family History  Problem Relation Age of Onset   Early death Father 65       train accident   Diabetes Mother    Heart disease Mother    Diabetes Sister    Diabetes Brother    Colon cancer Brother    Cancer Paternal Grandmother        unknown   Diabetes Sister    Allergies  Allergen Reactions   Rofecoxib     REACTION: reaction not known   Antihistamines, Chlorpheniramine-Type Palpitations   Sulfa Antibiotics Rash   Tetanus-Diphtheria Toxoids Td Other (See Comments)    REACTION: sick with fever   Current Outpatient Medications on File Prior to Visit  Medication Sig Dispense Refill   Accu-Chek Softclix Lancets lancets USE AS DIRECTED TO CHECK BLOOD SUGAR ONCE DAILY AND AS NEEDED. 100 each 3   Aspirin-Caffeine (BC FAST PAIN RELIEF PO) Take 1 packet by mouth daily as needed (headache).     Cholecalciferol (VITAMIN D3) 50 MCG (2000 UT) capsule Take 1 capsule (2,000 Units total) by mouth daily. 90 capsule 3   Cyanocobalamin (B-12 PO) Take by mouth.     furosemide (LASIX) 40 MG tablet TAKE 1 TABLET BY MOUTH DAILY 100 tablet 2   glipiZIDE (GLUCOTROL XL) 10 MG 24 hr tablet TAKE 1 TABLET BY MOUTH DAILY  WITH BREAKFAST 100 tablet 2   glucose blood (ACCU-CHEK AVIVA PLUS) test strip USE AS DIRECTED TO CHECK BLOOD SUGAR ONCE DAILY AND AS NEEDED. 90 each 3  losartan (COZAAR) 50 MG tablet TAKE 1 TABLET BY MOUTH DAILY 100 tablet 2   metFORMIN (GLUCOPHAGE) 1000 MG tablet TAKE 1 TABLET BY MOUTH TWICE  DAILY WITH A MEAL 200 tablet 2   Multiple Vitamin (MULTIVITAMIN) capsule Take 1 capsule by mouth daily.     omeprazole (PRILOSEC) 20 MG capsule TAKE 1 CAPSULE BY MOUTH DAILY 100 capsule 2   Pseudoephedrine HCl (SINUS & ALLERGY 12 HOUR PO) Take 1 tablet by mouth daily.      simvastatin (ZOCOR) 40 MG tablet TAKE 1 TABLET BY MOUTH AT  BEDTIME 100 tablet 2   triamcinolone cream (KENALOG) 0.1 % APPLY TO AFFECTED AREA(S)  TOPICALLY TWO TIMES A DAY 30 g 1   No  current facility-administered medications on file prior to visit.    Review of Systems  Constitutional:  Negative for activity change, appetite change, fatigue, fever and unexpected weight change.  HENT:  Negative for congestion, ear pain, rhinorrhea, sinus pressure and sore throat.   Eyes:  Negative for pain, redness and visual disturbance.  Respiratory:  Negative for cough, shortness of breath and wheezing.   Cardiovascular:  Negative for chest pain and palpitations.  Gastrointestinal:  Negative for abdominal pain, blood in stool, constipation and diarrhea.  Endocrine: Negative for polydipsia and polyuria.  Genitourinary:  Negative for dysuria, frequency and urgency.  Musculoskeletal:  Positive for arthralgias and back pain. Negative for myalgias.  Skin:  Negative for pallor and rash.  Allergic/Immunologic: Negative for environmental allergies.  Neurological:  Negative for dizziness, syncope and headaches.  Hematological:  Negative for adenopathy. Does not bruise/bleed easily.  Psychiatric/Behavioral:  Negative for decreased concentration and dysphoric mood. The patient is not nervous/anxious.        Objective:   Physical Exam Constitutional:      General: She is not in acute distress.    Appearance: Normal appearance. She is well-developed. She is obese. She is not ill-appearing or diaphoretic.  HENT:     Head: Normocephalic and atraumatic.     Right Ear: Tympanic membrane, ear canal and external ear normal.     Left Ear: Tympanic membrane, ear canal and external ear normal.     Nose: Nose normal. No congestion.     Mouth/Throat:     Mouth: Mucous membranes are moist.     Pharynx: Oropharynx is clear. No posterior oropharyngeal erythema.  Eyes:     General: No scleral icterus.    Extraocular Movements: Extraocular movements intact.     Conjunctiva/sclera: Conjunctivae normal.     Pupils: Pupils are equal, round, and reactive to light.  Neck:     Thyroid: No thyromegaly.      Vascular: No carotid bruit or JVD.  Cardiovascular:     Rate and Rhythm: Normal rate and regular rhythm.     Pulses: Normal pulses.     Heart sounds: Normal heart sounds.     No gallop.  Pulmonary:     Effort: Pulmonary effort is normal. No respiratory distress.     Breath sounds: Normal breath sounds. No wheezing.     Comments: Good air exch Chest:     Chest wall: No tenderness.  Abdominal:     General: Bowel sounds are normal. There is no distension or abdominal bruit.     Palpations: Abdomen is soft. There is no mass.     Tenderness: There is no abdominal tenderness.     Hernia: No hernia is present.  Genitourinary:    Comments: Declines breast exam  Musculoskeletal:        General: No tenderness. Normal range of motion.     Cervical back: Normal range of motion and neck supple. No rigidity. No muscular tenderness.     Right lower leg: No edema.     Left lower leg: No edema.     Comments: No kyphosis   Lymphadenopathy:     Cervical: No cervical adenopathy.  Skin:    General: Skin is warm and dry.     Coloration: Skin is not pale.     Findings: No erythema or rash.  Neurological:     Mental Status: She is alert. Mental status is at baseline.     Cranial Nerves: No cranial nerve deficit.     Motor: No abnormal muscle tone.     Coordination: Coordination normal.     Gait: Gait normal.     Deep Tendon Reflexes: Reflexes are normal and symmetric. Reflexes normal.  Psychiatric:        Mood and Affect: Mood normal.        Cognition and Memory: Cognition and memory normal.           Assessment & Plan:   Problem List Items Addressed This Visit       Cardiovascular and Mediastinum   Essential hypertension    bp in fair control at this time  BP Readings from Last 1 Encounters:  08/08/22 126/80  No changes needed Most recent labs reviewed  Disc lifstyle change with low sodium diet and exercise  Taking losartan 50 mg daily         Endocrine   Hyperlipidemia  associated with type 2 diabetes mellitus (HCC)    Disc goals for lipids and reasons to control them Rev last labs with pt Rev low sat fat diet in detail  LDL is well controlled with simvastatin 40 mg daily  Well tolerated  Trig improved Will continue to work on diet also       Type II diabetes mellitus with renal manifestations (HCC)    Worse Lab Results  Component Value Date   HGBA1C 8.2 (H) 08/03/2022  Pt thinks this is due to diet high in sugar and plans ot change it  Metformin 1000 mg bid Glipizide xl 10 mg daily  F/u 3 mo  Microalb utd  May be candidate for GLP med if not improved  Will check glucose levels  Eye exam utd          Musculoskeletal and Integument   Skin lesion    Macular lesion/mole on R back has irreg color and shape Ref to derm for further eval      Relevant Orders   Ambulatory referral to Dermatology     Genitourinary   CKD (chronic kidney disease), stage II    Stable Unsure if rel to her dm  GFR of 53.8  Takes aleve- discussed risk of this and recommend change to tylenol Encouraged better water intake          Other   Current use of proton pump inhibitor    Encouraged B12 supplementation  Also takes mvi      Morbid obesity (HCC)    Discussed how this problem influences overall health and the risks it imposes  Reviewed plan for weight loss with lower calorie diet (via better food choices and also portion control or program like weight watchers) and exercise building up to or more than 30 minutes 5 days per week including some aerobic activity  Would like to consider chair exercise       Routine general medical examination at a health care facility - Primary    Reviewed health habits including diet and exercise and skin cancer prevention Reviewed appropriate screening tests for age  Also reviewed health mt list, fam hx and immunization status , as well as social and family history   See HPI Labs reviewed and ordered Pt will get  shingrix in pharmacy  No Td due to reaction in the past  Discussed options for chair exercise Declines breast or colon cancer screening Declines dexa  Discussed fall prevention  Eye exam utd  PHQ scoe of 0

## 2022-08-08 NOTE — Telephone Encounter (Signed)
Noted  

## 2022-08-08 NOTE — Telephone Encounter (Signed)
Patient called in and stated that she would like her referral to be sent to [facility] or anywhere else in [place]. She stated that she needs an appointment in the next few weeks. Thank you!

## 2022-10-25 ENCOUNTER — Encounter (INDEPENDENT_AMBULATORY_CARE_PROVIDER_SITE_OTHER): Payer: Self-pay

## 2022-10-31 ENCOUNTER — Telehealth: Payer: Self-pay | Admitting: Family Medicine

## 2022-10-31 ENCOUNTER — Other Ambulatory Visit: Payer: Self-pay | Admitting: Family Medicine

## 2022-10-31 DIAGNOSIS — E1122 Type 2 diabetes mellitus with diabetic chronic kidney disease: Secondary | ICD-10-CM

## 2022-10-31 DIAGNOSIS — I10 Essential (primary) hypertension: Secondary | ICD-10-CM

## 2022-10-31 NOTE — Telephone Encounter (Signed)
-----   Message from Lovena Neighbours sent at 10/17/2022  1:52 PM EDT ----- Regarding: Labs 8.7.24 Pt is on lab schedule for follow up labs on 8.7.24, please put orders in future. Thank you, Denny Peon

## 2022-11-01 ENCOUNTER — Other Ambulatory Visit (INDEPENDENT_AMBULATORY_CARE_PROVIDER_SITE_OTHER): Payer: Medicare Other

## 2022-11-01 DIAGNOSIS — E1122 Type 2 diabetes mellitus with diabetic chronic kidney disease: Secondary | ICD-10-CM

## 2022-11-01 DIAGNOSIS — I10 Essential (primary) hypertension: Secondary | ICD-10-CM | POA: Diagnosis not present

## 2022-11-02 ENCOUNTER — Telehealth: Payer: Self-pay | Admitting: Family Medicine

## 2022-11-02 DIAGNOSIS — R21 Rash and other nonspecific skin eruption: Secondary | ICD-10-CM

## 2022-11-02 MED ORDER — TRIAMCINOLONE ACETONIDE 0.1 % EX CREA
TOPICAL_CREAM | CUTANEOUS | 1 refills | Status: AC
Start: 2022-11-02 — End: ?

## 2022-11-02 NOTE — Telephone Encounter (Signed)
Prescription Request  11/02/2022  LOV: 08/08/2022  What is the name of the medication or equipment? triamcinolone cream (KENALOG) 0.1 %    Have you contacted your pharmacy to request a refill? Yes   Which pharmacy would you like this sent to?  [facility]) [place], [place] - [address] [facility] [address] [place] [address] [place] [address] Phone: [phone] Fax: [phone]    Patient notified that their request is being sent to the clinical staff for review and that they should receive a response within 2 business days.   Please advise at Mobile [phone] (mobile)

## 2022-11-08 ENCOUNTER — Ambulatory Visit (INDEPENDENT_AMBULATORY_CARE_PROVIDER_SITE_OTHER): Payer: Medicare Other | Admitting: Family Medicine

## 2022-11-08 ENCOUNTER — Encounter: Payer: Self-pay | Admitting: Family Medicine

## 2022-11-08 VITALS — BP 160/80 | HR 74 | Temp 97.7°F | Ht 59.5 in | Wt 204.2 lb

## 2022-11-08 DIAGNOSIS — R944 Abnormal results of kidney function studies: Secondary | ICD-10-CM | POA: Insufficient documentation

## 2022-11-08 DIAGNOSIS — Z7984 Long term (current) use of oral hypoglycemic drugs: Secondary | ICD-10-CM | POA: Diagnosis not present

## 2022-11-08 DIAGNOSIS — E1122 Type 2 diabetes mellitus with diabetic chronic kidney disease: Secondary | ICD-10-CM

## 2022-11-08 DIAGNOSIS — E785 Hyperlipidemia, unspecified: Secondary | ICD-10-CM | POA: Diagnosis not present

## 2022-11-08 DIAGNOSIS — I10 Essential (primary) hypertension: Secondary | ICD-10-CM

## 2022-11-08 DIAGNOSIS — I5032 Chronic diastolic (congestive) heart failure: Secondary | ICD-10-CM

## 2022-11-08 DIAGNOSIS — E1169 Type 2 diabetes mellitus with other specified complication: Secondary | ICD-10-CM

## 2022-11-08 LAB — POCT GLYCOSYLATED HEMOGLOBIN (HGB A1C): Hemoglobin A1C: 7 % — AB (ref 4.0–5.6)

## 2022-11-08 MED ORDER — FUROSEMIDE 40 MG PO TABS: 40.0000 mg | ORAL_TABLET | Freq: Every day | ORAL | 1 refills | Status: DC

## 2022-11-08 NOTE — Progress Notes (Signed)
Subjective:    Patient ID: Gabriella Howe, female    DOB: [birth year], 88 y.o.   MRN: [id number]  HPI  Wt Readings from Last 3 Encounters:  11/08/22 204 lb 3.2 oz (92.6 kg)  08/08/22 210 lb 4 oz (95.4 kg)  07/26/22 210 lb (95.3 kg)   40.55 kg/m  Vitals:   11/08/22 0946 11/08/22 0952  BP: (!) 160/90 (!) 160/80  Pulse: 74   Temp: 97.7 F (36.5 C)   SpO2: 96%    Pt presents for follow up of DM2 and chronic medical problems   Feels pretty good    HTN bp is stable today  No cp or palpitations or headaches or edema  No side effects to medicines  BP Readings from Last 3 Encounters:  11/08/22 (!) 160/80  08/08/22 126/80  02/13/22 (!) 146/78     Losartan 50 mg daily -has not taken it yet (has not taken any of her medicine today)  Lasix 40 mg daily   Lab Results  Component Value Date   NA 140 11/01/2022   K 4.4 11/01/2022   CO2 28 11/01/2022   GLUCOSE 129 (H) 11/01/2022   BUN 19 11/01/2022   CREATININE 1.08 11/01/2022   CALCIUM 10.0 11/01/2022   GFR 46.69 (L) 11/01/2022   GFRNONAA >60 10/29/2020   GFR was down the day of her visit -but was late  Drinks green tea/ does not drink water     DM2 Lab Results  Component Value Date   HGBA1C 7.0 (A) 11/08/2022   This is down from 8.2   Diet is much better ! No extra sugar -no sweets as a rule / doing very well   Avoiding chips /stopped them Less sodium Eats too much bread    Tries to eat enough protein - chicken /pinto beans  Some fish  Cheese (no milk)   Exercise is a challenge Could do more  Dislikes the water   Metformin 1000 mg bid  Glipizide xl 10 mg daily   Lab Results  Component Value Date   LABMICR See below: 02/26/2020   LABMICR See below: 02/17/2020   MICROALBUR 3.4 (H) 08/08/2022   MICROALBUR 0.2 01/14/2008   Not checking blood sugars   Eye exam was in April   Lab Results  Component Value Date   CHOL 120 08/03/2022   HDL 49.70 08/03/2022   LDLCALC 45 08/03/2022   TRIG 126.0  08/03/2022   CHOLHDL 2 08/03/2022   Simvastatin 40 mg daily       Patient Active Problem List   Diagnosis Date Noted   Decreased GFR 11/08/2022   Skin lesion 08/08/2022   Head pain 02/13/2022   Neck pain 02/13/2022   Current use of proton pump inhibitor 10/19/2021   Kidney stones 11/02/2020   Asthma 10/23/2020   CKD (chronic kidney disease), stage II 10/23/2020   Pedal edema 08/05/2019   DJD (degenerative joint disease) 02/26/2019   Iron deficiency anemia 04/30/2017   History of shingles 10/03/2016   Routine general medical examination at a health care facility 04/14/2015   Knee pain 07/28/2014   Incomplete emptying of bladder 11/28/2012   Encounter for Medicare annual wellness exam 11/19/2012   History of kidney stones 11/19/2012   Fatigue 06/07/2011   DIASTOLIC HEART FAILURE, CHRONIC 07/29/2009   Hyperlipidemia associated with type 2 diabetes mellitus (HCC) 09/13/2006   Morbid obesity (HCC) 09/10/2006   CARPAL TUNNEL SYNDROME 09/10/2006   Essential hypertension 09/10/2006   DIASTOLIC DYSFUNCTION 09/10/2006  REACTIVE AIRWAY DISEASE 09/10/2006   GERD 09/10/2006   ROSACEA 09/10/2006   Degeneration of lumbar or lumbosacral intervertebral disc 09/10/2006   OSA (obstructive sleep apnea) 09/10/2006   COLONIC POLYPS, HX OF 09/10/2006   Type II diabetes mellitus with renal manifestations (HCC) 09/06/2006   Past Medical History:  Diagnosis Date   Adenomatous colon polyp 2003   Benign neoplasm of colon    Carpal tunnel syndrome    much better   Chest pain, unspecified    CHF (congestive heart failure) (HCC)    diastolic   Complication of anesthesia    careful in positioning due to back pain   Degeneration of intervertebral disc, site unspecified    Diabetes mellitus    type II   Encounter for long-term (current) use of other medications    Fluid overload    GERD (gastroesophageal reflux disease)    Heart disease, unspecified    History of kidney stones    with  stent/hospitalized   Hyperlipidemia    Hypertension    Obesity, unspecified    Rosacea    Shortness of breath    Unspecified asthma(493.90)    Unspecified sleep apnea    no cpap/ sleeps sitting up   Past Surgical History:  Procedure Laterality Date   ABDOMINAL HYSTERECTOMY     partial, fibroids   BOTOX INJECTION N/A 02/09/2020   Procedure: BOTOX INJECTION;  Surgeon: Vanna Scotland, MD;  Location: [facility];  Service: Urology;  Laterality: N/A;   CYSTOSCOPY WITH STENT PLACEMENT Left 02/18/2019   Procedure: CYSTOSCOPY WITH STENT PLACEMENT;  Surgeon: Riki Altes, MD;  Location: [facility];  Service: Urology;  Laterality: Left;   CYSTOSCOPY/URETEROSCOPY/HOLMIUM LASER/STENT PLACEMENT Left 04/01/2019   Procedure: CYSTOSCOPY/URETEROSCOPY/HOLMIUM LASER/STENT PLACEMENT;  Surgeon: Riki Altes, MD;  Location: [facility];  Service: Urology;  Laterality: Left;   ESOPHAGOGASTRODUODENOSCOPY  2000   TONSILLECTOMY     UMBILICAL HERNIA REPAIR  01/2004   URETERAL STENT PLACEMENT  2/13   for L sided stone/ ecoli sepsis   Social History   Tobacco Use   Smoking status: Never    Passive exposure: Past   Smokeless tobacco: Never  Vaping Use   Vaping status: Never Used  Substance Use Topics   Alcohol use: No    Alcohol/week: 0.0 standard drinks of alcohol   Drug use: No   Family History  Problem Relation Age of Onset   Early death Father 13       train accident   Diabetes Mother    Heart disease Mother    Diabetes Sister    Diabetes Brother    Colon cancer Brother    Cancer Paternal Grandmother        unknown   Diabetes Sister    Allergies  Allergen Reactions   Rofecoxib     REACTION: reaction not known   Antihistamines, Chlorpheniramine-Type Palpitations   Sulfa Antibiotics Rash   Tetanus-Diphtheria Toxoids Td Other (See Comments)    REACTION: sick with fever   Current Outpatient Medications on File Prior to Visit  Medication Sig Dispense Refill   Accu-Chek Softclix Lancets  lancets USE AS DIRECTED TO CHECK BLOOD SUGAR ONCE DAILY AND AS NEEDED. 100 each 3   Aspirin-Caffeine (BC FAST PAIN RELIEF PO) Take 1 packet by mouth daily as needed (headache).     Cholecalciferol (VITAMIN D3) 50 MCG (2000 UT) capsule Take 1 capsule (2,000 Units total) by mouth daily. 90 capsule 3   Cyanocobalamin (B-12 PO) Take by mouth.  glipiZIDE (GLUCOTROL XL) 10 MG 24 hr tablet TAKE 1 TABLET BY MOUTH DAILY  WITH BREAKFAST 100 tablet 1   glucose blood (ACCU-CHEK AVIVA PLUS) test strip USE AS DIRECTED TO CHECK BLOOD SUGAR ONCE DAILY AND AS NEEDED. 90 each 3   losartan (COZAAR) 50 MG tablet TAKE 1 TABLET BY MOUTH DAILY 100 tablet 1   metFORMIN (GLUCOPHAGE) 1000 MG tablet TAKE 1 TABLET BY MOUTH TWICE  DAILY WITH MEALS 200 tablet 1   Multiple Vitamin (MULTIVITAMIN) capsule Take 1 capsule by mouth daily.     omeprazole (PRILOSEC) 20 MG capsule TAKE 1 CAPSULE BY MOUTH DAILY 100 capsule 1   Pseudoephedrine HCl (SINUS & ALLERGY 12 HOUR PO) Take 1 tablet by mouth daily.      simvastatin (ZOCOR) 40 MG tablet TAKE 1 TABLET BY MOUTH AT  BEDTIME 100 tablet 1   triamcinolone cream (KENALOG) 0.1 % APPLY TO AFFECTED AREA(S)  TOPICALLY TWO TIMES A DAY 30 g 1   No current facility-administered medications on file prior to visit.    Review of Systems  Constitutional:  Positive for fatigue. Negative for activity change, appetite change, fever and unexpected weight change.  HENT:  Negative for congestion, ear pain, rhinorrhea, sinus pressure and sore throat.   Eyes:  Negative for pain, redness and visual disturbance.  Respiratory:  Negative for cough, shortness of breath and wheezing.   Cardiovascular:  Positive for leg swelling. Negative for chest pain and palpitations.  Gastrointestinal:  Negative for abdominal pain, blood in stool, constipation and diarrhea.  Endocrine: Negative for polydipsia and polyuria.  Genitourinary:  Negative for dysuria, frequency and urgency.  Musculoskeletal:  Negative for  arthralgias, back pain and myalgias.  Skin:  Negative for pallor and rash.  Allergic/Immunologic: Negative for environmental allergies.  Neurological:  Negative for dizziness, syncope and headaches.  Hematological:  Negative for adenopathy. Does not bruise/bleed easily.  Psychiatric/Behavioral:  Negative for decreased concentration and dysphoric mood. The patient is not nervous/anxious.        Objective:   Physical Exam Constitutional:      General: She is not in acute distress.    Appearance: Normal appearance. She is well-developed. She is obese. She is not ill-appearing or diaphoretic.  HENT:     Head: Normocephalic and atraumatic.  Eyes:     Conjunctiva/sclera: Conjunctivae normal.     Pupils: Pupils are equal, round, and reactive to light.  Neck:     Thyroid: No thyromegaly.     Vascular: No carotid bruit or JVD.  Cardiovascular:     Rate and Rhythm: Normal rate and regular rhythm.     Heart sounds: Normal heart sounds.     No gallop.  Pulmonary:     Effort: Pulmonary effort is normal. No respiratory distress.     Breath sounds: Normal breath sounds. No wheezing or rales.  Abdominal:     General: There is no distension or abdominal bruit.     Palpations: Abdomen is soft.  Musculoskeletal:     Cervical back: Normal range of motion and neck supple.     Right lower leg: Edema present.     Left lower leg: Edema present.     Comments: Mild LE edema   Lymphadenopathy:     Cervical: No cervical adenopathy.  Skin:    General: Skin is warm and dry.     Coloration: Skin is not pale.     Findings: No rash.  Neurological:     Mental Status: She is  alert.     Coordination: Coordination normal.     Deep Tendon Reflexes: Reflexes are normal and symmetric. Reflexes normal.  Psychiatric:        Mood and Affect: Mood normal.           Assessment & Plan:   Problem List Items Addressed This Visit       Cardiovascular and Mediastinum   Essential hypertension    Missed  her medication today (losartan 50 mg and lasix 40) Refilled  BP: (!) 160/80  Follow up planned for 1 month to re check and also labs (GFR is reduced)        Relevant Medications   furosemide (LASIX) 40 MG tablet   DIASTOLIC HEART FAILURE, CHRONIC    Continues lasix 40 mg daily  Labs reviewed  No sympt      Relevant Medications   furosemide (LASIX) 40 MG tablet     Endocrine   Type II diabetes mellitus with renal manifestations (HCC) - Primary    Lab Results  Component Value Date   HGBA1C 7.0 (A) 11/08/2022   This is improved but not quite at goal Watching renal function - on metformin 1000 mg bid (re check bmet in a mo) Taking glipizide xl 10 mg daily  No low glucose  Microalb utd  On a statin   Given info on GLP-1 meds today  Would be a candidtat Will discuss further at 1 mo f/u      Relevant Orders   POC HgB A1c (Completed)   Hyperlipidemia associated with type 2 diabetes mellitus (HCC)    Disc goals for lipids and reasons to control them Rev last labs with pt Rev low sat fat diet in detail  LDL is well controlled with simvastatin 40 mg daily  Well tolerated  Trig improved Will continue to work on diet also       Relevant Medications   furosemide (LASIX) 40 MG tablet     Other   Morbid obesity (HCC)    Discussed how this problem influences overall health and the risks it imposes  Reviewed plan for weight loss with lower calorie diet (via better food choices (lower glycemic and portion control) along with exercise building up to or more than 30 minutes 5 days per week including some aerobic activity and strength training    Given info on GLP-1 meds-will discuss at f/u      Decreased GFR    New GFR of 16.6 Had not had as much fluid this day Also on metformin  Also takes occational nsaid  Counseled on fluids  Also instructed to avoid nsaids  Blood pressure up today-missed medications Follow up 1 month and re check at visit

## 2022-11-08 NOTE — Assessment & Plan Note (Signed)
Disc goals for lipids and reasons to control them Rev last labs with pt Rev low sat fat diet in detail  LDL is well controlled with simvastatin 40 mg daily  Well tolerated  Trig improved Will continue to work on diet also

## 2022-11-08 NOTE — Assessment & Plan Note (Signed)
Discussed how this problem influences overall health and the risks it imposes  Reviewed plan for weight loss with lower calorie diet (via better food choices (lower glycemic and portion control) along with exercise building up to or more than 30 minutes 5 days per week including some aerobic activity and strength training    Given info on GLP-1 meds-will discuss at f/u

## 2022-11-08 NOTE — Assessment & Plan Note (Signed)
New GFR of 16.6 Had not had as much fluid this day Also on metformin  Also takes occational nsaid  Counseled on fluids  Also instructed to avoid nsaids  Blood pressure up today-missed medications Follow up 1 month and re check at visit

## 2022-11-08 NOTE — Assessment & Plan Note (Signed)
Lab Results  Component Value Date   HGBA1C 7.0 (A) 11/08/2022   This is improved but not quite at goal Watching renal function - on metformin 1000 mg bid (re check bmet in a mo) Taking glipizide xl 10 mg daily  No low glucose  Microalb utd  On a statin   Given info on GLP-1 meds today  Would be a candidtat Will discuss further at 1 mo f/u

## 2022-11-08 NOTE — Assessment & Plan Note (Signed)
Continues lasix 40 mg daily  Labs reviewed  No sympt

## 2022-11-08 NOTE — Assessment & Plan Note (Signed)
Missed her medication today (losartan 50 mg and lasix 40) Refilled  BP: (!) 160/80  Follow up planned for 1 month to re check and also labs (GFR is reduced)

## 2022-11-08 NOTE — Patient Instructions (Addendum)
Stay as active as you can  Add some strength training to your routine, this is important for bone and brain health and can reduce your risk of falls and help your body use insulin properly and regulate weight  Light weights, exercise bands , and internet videos are a good way to start  Yoga (chair or regular), machines , floor exercises or a gym with machines are also good options     Try to get most of your carbohydrates from produce (with the exception of white potatoes) and whole grains Eat less bread/pasta/rice/snack foods/cereals/sweets and other items from the middle of the grocery store (processed carbs)  Sandwich-open face or 1/2 of one  The brown or whole grain breads are better   Follow up in 1 month well hydrated (making sure you took losartan and lasix)  I want to see how BP is and also check renal labs again   Take care of yourself   Your last kidney levels are off  Avoid aleve / ibuprofen or BC unless it is an emergency and you have to have it  Voltaren gel (topical) is a good options

## 2022-11-09 ENCOUNTER — Encounter (INDEPENDENT_AMBULATORY_CARE_PROVIDER_SITE_OTHER): Payer: Self-pay

## 2022-12-11 ENCOUNTER — Ambulatory Visit (INDEPENDENT_AMBULATORY_CARE_PROVIDER_SITE_OTHER): Payer: Medicare Other | Admitting: Family Medicine

## 2022-12-11 ENCOUNTER — Encounter: Payer: Self-pay | Admitting: Family Medicine

## 2022-12-11 VITALS — BP 141/88 | HR 80 | Temp 97.8°F | Ht 59.5 in | Wt 201.1 lb

## 2022-12-11 DIAGNOSIS — E1122 Type 2 diabetes mellitus with diabetic chronic kidney disease: Secondary | ICD-10-CM

## 2022-12-11 DIAGNOSIS — Z7985 Long-term (current) use of injectable non-insulin antidiabetic drugs: Secondary | ICD-10-CM | POA: Diagnosis not present

## 2022-12-11 DIAGNOSIS — E785 Hyperlipidemia, unspecified: Secondary | ICD-10-CM | POA: Diagnosis not present

## 2022-12-11 DIAGNOSIS — Z23 Encounter for immunization: Secondary | ICD-10-CM | POA: Diagnosis not present

## 2022-12-11 DIAGNOSIS — I10 Essential (primary) hypertension: Secondary | ICD-10-CM | POA: Diagnosis not present

## 2022-12-11 DIAGNOSIS — E1169 Type 2 diabetes mellitus with other specified complication: Secondary | ICD-10-CM | POA: Diagnosis not present

## 2022-12-11 DIAGNOSIS — I519 Heart disease, unspecified: Secondary | ICD-10-CM

## 2022-12-11 DIAGNOSIS — R944 Abnormal results of kidney function studies: Secondary | ICD-10-CM

## 2022-12-11 LAB — BASIC METABOLIC PANEL
BUN: 15 mg/dL (ref 6–23)
CO2: 26 meq/L (ref 19–32)
Calcium: 9.7 mg/dL (ref 8.4–10.5)
Chloride: 104 meq/L (ref 96–112)
Creatinine, Ser: 0.95 mg/dL (ref 0.40–1.20)
GFR: 54.41 mL/min — ABNORMAL LOW (ref >60.00)
Glucose, Bld: 127 mg/dL — ABNORMAL HIGH (ref 70–99)
Potassium: 4.3 mEq/L (ref 3.5–5.1)
Sodium: 142 meq/L (ref 135–145)

## 2022-12-11 MED ORDER — OZEMPIC (0.25 OR 0.5 MG/DOSE) 2 MG/3ML ~~LOC~~ SOPN: 0.2500 mg | PEN_INJECTOR | SUBCUTANEOUS | 0 refills | Status: DC

## 2022-12-11 NOTE — Assessment & Plan Note (Signed)
Disc goals for lipids and reasons to control them Rev last labs with pt Rev low sat fat diet in detail  LDL is well controlled with simvastatin 40 mg daily  Well tolerated  Trig improved Will continue to work on diet also

## 2022-12-11 NOTE — Assessment & Plan Note (Signed)
bp in fair control at this time  BP Readings from Last 1 Encounters:  12/11/22 (!) 141/88   Most recent labs reviewed  Disc lifstyle change with low sodium diet and exercise  Not at goal but improved from last visit  Continues  Losartan 50 mg daily  Lasix 40 mg daily (also for DD)  Renal labs today

## 2022-12-11 NOTE — Assessment & Plan Note (Signed)
Taking lasix 40 mg daily  C/o ankle swelling all the time

## 2022-12-11 NOTE — Patient Instructions (Addendum)
I want to try a generic GLP drug for your diabetes I sent in generic ozempic and see if it is covered   Let us know if/when you get started  We can go up on the dose monthly if tolerated well  This will help with diabetes and weight loss   Labs today  I will let you know  what to do with your metformin   Keep watching diet Try to get most of your carbohydrates from produce (with the exception of white potatoes) and whole grains Eat less bread/pasta/rice/snack foods/cereals/sweets and other items from the middle of the grocery store (processed carbs)  Stay as active as you can safely be   Start checking some blood sugar levels and keep a log  Some days in am and some days in pm  Keep a log   Flu shot today

## 2022-12-11 NOTE — Assessment & Plan Note (Signed)
Last time 49.69 Re check today  Losartan and lasix   Has cut metformin to once daily due to nausea also   Pending result for plan

## 2022-12-11 NOTE — Assessment & Plan Note (Signed)
Lab Results  Component Value Date   HGBA1C 7.0 (A) 11/08/2022   She is only taking metformin once daily due to nausea with it  Bmet today-will advise further  Glipizide xl 10 mg daily   Willing to try GLP med  Disc option of GLP medication including possible side effects like GI intolerance and risk of thyroid and endocrine cancer, pancreatitis and gallstones, kidney problems and diabetic retinopathy Given handout   Sent semaglutide 0.25 ot pharmacy  Hope it will help with weight loss also  She will call once approved and starting  Instructed to call if side effects as well   May be able to back off of metformin (renal) or glipizide (risk of hypoglycemia )if this works out  Asked pt to check glucose daily (different times) and keep a log Commended better diet and weight loss so far  Limited exercise due to ortho issues /back and joints   Next A1c due in nov

## 2022-12-11 NOTE — Progress Notes (Signed)
Subjective:    Patient ID: Gabriella Howe, female    DOB: [birth year], 88 y.o.   MRN: [id number]  HPI  Wt Readings from Last 3 Encounters:  12/11/22 201 lb 2 oz (91.2 kg)  11/08/22 204 lb 3.2 oz (92.6 kg)  08/08/22 210 lb 4 oz (95.4 kg)   39.94 kg/m  Vitals:   12/11/22 1354 12/11/22 1413  BP: (!) 148/94 (!) 141/88  Pulse: 80   Temp: 97.8 F (36.6 C)   SpO2: 99%      Pt presents for follow up of HTN and DM2 and chronic health problems  Also for flu shot     HTN bp is stable today  No cp or palpitations or headaches or edema  No side effects to medicines  BP Readings from Last 3 Encounters:  12/11/22 (!) 141/88  11/08/22 (!) 160/80  08/08/22 126/80    Missed  medication last time  Losartan 50 mg  Lasix 40 mg daily (also for diastolic HF)   Lab Results  Component Value Date   NA 140 11/01/2022   K 4.4 11/01/2022   CO2 28 11/01/2022   GLUCOSE 129 (H) 11/01/2022   BUN 19 11/01/2022   CREATININE 1.08 11/01/2022   CALCIUM 10.0 11/01/2022   GFR 46.69 (L) 11/01/2022   GFRNONAA >60 10/29/2020   GFR was down last time    DM2 Lab Results  Component Value Date   HGBA1C 7.0 (A) 11/08/2022   Watching renal fxn on metformin 1000 mg bid  She has only been taking one per day due to nausea  Encouraged more fluids  Glipizide xl 10 mg daily   Given info non GLP -1 med last time   She has cut out chips Cut out sweets /sugar  Eating better  Cut way back on bread   Not a lot of exercise due to orthopedic issues She cane Lifts 15 lb bag of cat food   Lab Results  Component Value Date   LABMICR See below: 02/26/2020   LABMICR See below: 02/17/2020   MICROALBUR 3.4 (H) 08/08/2022   MICROALBUR 0.2 01/14/2008    Hyperlipidemia Lab Results  Component Value Date   CHOL 120 08/03/2022   HDL 49.70 08/03/2022   LDLCALC 45 08/03/2022   TRIG 126.0 08/03/2022   CHOLHDL 2 08/03/2022   Simvastatin 40 mg daily  Well controlled    Patient Active Problem  List   Diagnosis Date Noted   Decreased GFR 11/08/2022   Skin lesion 08/08/2022   Head pain 02/13/2022   Neck pain 02/13/2022   Current use of proton pump inhibitor 10/19/2021   Kidney stones 11/02/2020   Asthma 10/23/2020   CKD (chronic kidney disease), stage II 10/23/2020   Pedal edema 08/05/2019   DJD (degenerative joint disease) 02/26/2019   Iron deficiency anemia 04/30/2017   History of shingles 10/03/2016   Routine general medical examination at a health care facility 04/14/2015   Knee pain 07/28/2014   Incomplete emptying of bladder 11/28/2012   Encounter for Medicare annual wellness exam 11/19/2012   History of kidney stones 11/19/2012   Fatigue 06/07/2011   DIASTOLIC HEART FAILURE, CHRONIC 07/29/2009   Hyperlipidemia associated with type 2 diabetes mellitus (HCC) 09/13/2006   Morbid obesity (HCC) 09/10/2006   CARPAL TUNNEL SYNDROME 09/10/2006   Essential hypertension 09/10/2006   DIASTOLIC DYSFUNCTION 09/10/2006   REACTIVE AIRWAY DISEASE 09/10/2006   GERD 09/10/2006   ROSACEA 09/10/2006   Degeneration of lumbar or lumbosacral intervertebral disc 09/10/2006  OSA (obstructive sleep apnea) 09/10/2006   COLONIC POLYPS, HX OF 09/10/2006   Type II diabetes mellitus with renal manifestations (HCC) 09/06/2006   Past Medical History:  Diagnosis Date   Adenomatous colon polyp 2003   Benign neoplasm of colon    Carpal tunnel syndrome    much better   Chest pain, unspecified    CHF (congestive heart failure) (HCC)    diastolic   Complication of anesthesia    careful in positioning due to back pain   Degeneration of intervertebral disc, site unspecified    Diabetes mellitus    type II   Encounter for long-term (current) use of other medications    Fluid overload    GERD (gastroesophageal reflux disease)    Heart disease, unspecified    History of kidney stones    with stent/hospitalized   Hyperlipidemia    Hypertension    Obesity, unspecified    Rosacea     Shortness of breath    Unspecified asthma(493.90)    Unspecified sleep apnea    no cpap/ sleeps sitting up   Past Surgical History:  Procedure Laterality Date   ABDOMINAL HYSTERECTOMY     partial, fibroids   BOTOX INJECTION N/A 02/09/2020   Procedure: BOTOX INJECTION;  Surgeon: Vanna Scotland, MD;  Location: [facility];  Service: Urology;  Laterality: N/A;   CYSTOSCOPY WITH STENT PLACEMENT Left 02/18/2019   Procedure: CYSTOSCOPY WITH STENT PLACEMENT;  Surgeon: Riki Altes, MD;  Location: [facility];  Service: Urology;  Laterality: Left;   CYSTOSCOPY/URETEROSCOPY/HOLMIUM LASER/STENT PLACEMENT Left 04/01/2019   Procedure: CYSTOSCOPY/URETEROSCOPY/HOLMIUM LASER/STENT PLACEMENT;  Surgeon: Riki Altes, MD;  Location: [facility];  Service: Urology;  Laterality: Left;   ESOPHAGOGASTRODUODENOSCOPY  2000   TONSILLECTOMY     UMBILICAL HERNIA REPAIR  01/2004   URETERAL STENT PLACEMENT  2/13   for L sided stone/ ecoli sepsis   Social History   Tobacco Use   Smoking status: Never    Passive exposure: Past   Smokeless tobacco: Never  Vaping Use   Vaping status: Never Used  Substance Use Topics   Alcohol use: No    Alcohol/week: 0.0 standard drinks of alcohol   Drug use: No   Family History  Problem Relation Age of Onset   Early death Father 57       train accident   Diabetes Mother    Heart disease Mother    Diabetes Sister    Diabetes Brother    Colon cancer Brother    Cancer Paternal Grandmother        unknown   Diabetes Sister    Allergies  Allergen Reactions   Rofecoxib     REACTION: reaction not known   Antihistamines, Chlorpheniramine-Type Palpitations   Sulfa Antibiotics Rash   Tetanus-Diphtheria Toxoids Td Other (See Comments)    REACTION: sick with fever   Current Outpatient Medications on File Prior to Visit  Medication Sig Dispense Refill   Accu-Chek Softclix Lancets lancets USE AS DIRECTED TO CHECK BLOOD SUGAR ONCE DAILY AND AS NEEDED. 100 each 3    Aspirin-Caffeine (BC FAST PAIN RELIEF PO) Take 1 packet by mouth daily as needed (headache).     Cholecalciferol (VITAMIN D3) 50 MCG (2000 UT) capsule Take 1 capsule (2,000 Units total) by mouth daily. 90 capsule 3   Cyanocobalamin (B-12 PO) Take by mouth.     furosemide (LASIX) 40 MG tablet Take 1 tablet (40 mg total) by mouth daily. 100 tablet 1   glipiZIDE (  GLUCOTROL XL) 10 MG 24 hr tablet TAKE 1 TABLET BY MOUTH DAILY  WITH BREAKFAST 100 tablet 1   glucose blood (ACCU-CHEK AVIVA PLUS) test strip USE AS DIRECTED TO CHECK BLOOD SUGAR ONCE DAILY AND AS NEEDED. 90 each 3   losartan (COZAAR) 50 MG tablet TAKE 1 TABLET BY MOUTH DAILY 100 tablet 1   metFORMIN (GLUCOPHAGE) 1000 MG tablet TAKE 1 TABLET BY MOUTH TWICE  DAILY WITH MEALS (Patient taking differently: Take 1,000 mg by mouth daily with breakfast.) 200 tablet 1   Multiple Vitamin (MULTIVITAMIN) capsule Take 1 capsule by mouth daily.     omeprazole (PRILOSEC) 20 MG capsule TAKE 1 CAPSULE BY MOUTH DAILY 100 capsule 1   Pseudoephedrine HCl (SINUS & ALLERGY 12 HOUR PO) Take 1 tablet by mouth daily.      simvastatin (ZOCOR) 40 MG tablet TAKE 1 TABLET BY MOUTH AT  BEDTIME 100 tablet 1   triamcinolone cream (KENALOG) 0.1 % APPLY TO AFFECTED AREA(S)  TOPICALLY TWO TIMES A DAY 30 g 1   No current facility-administered medications on file prior to visit.    Review of Systems  Constitutional:  Positive for fatigue. Negative for activity change, appetite change, fever and unexpected weight change.  HENT:  Negative for congestion, ear pain, rhinorrhea, sinus pressure and sore throat.   Eyes:  Negative for pain, redness and visual disturbance.  Respiratory:  Negative for cough, shortness of breath and wheezing.   Cardiovascular:  Positive for leg swelling. Negative for chest pain and palpitations.  Gastrointestinal:  Negative for abdominal pain, blood in stool, constipation and diarrhea.  Endocrine: Negative for polydipsia and polyuria.   Genitourinary:  Negative for dysuria, frequency and urgency.  Musculoskeletal:  Positive for arthralgias and back pain. Negative for myalgias.  Skin:  Negative for pallor and rash.  Allergic/Immunologic: Negative for environmental allergies.  Neurological:  Negative for dizziness, syncope and headaches.  Hematological:  Negative for adenopathy. Does not bruise/bleed easily.  Psychiatric/Behavioral:  Negative for decreased concentration and dysphoric mood. The patient is not nervous/anxious.        Objective:   Physical Exam Constitutional:      General: She is not in acute distress.    Appearance: Normal appearance. She is well-developed. She is obese. She is not ill-appearing or diaphoretic.  HENT:     Head: Normocephalic and atraumatic.     Mouth/Throat:     Mouth: Mucous membranes are moist.  Eyes:     Conjunctiva/sclera: Conjunctivae normal.     Pupils: Pupils are equal, round, and reactive to light.  Neck:     Thyroid: No thyromegaly.     Vascular: No carotid bruit or JVD.  Cardiovascular:     Rate and Rhythm: Normal rate and regular rhythm.     Heart sounds: Normal heart sounds.     No gallop.  Pulmonary:     Effort: Pulmonary effort is normal. No respiratory distress.     Breath sounds: Normal breath sounds. No wheezing or rales.  Abdominal:     General: There is no distension or abdominal bruit.     Palpations: Abdomen is soft.  Musculoskeletal:     Cervical back: Normal range of motion and neck supple.     Right lower leg: Edema present.     Left lower leg: Edema present.     Comments: Trace pedal edema today  Lymphadenopathy:     Cervical: No cervical adenopathy.  Skin:    General: Skin is warm and dry.  Coloration: Skin is not pale.     Findings: No rash.  Neurological:     Mental Status: She is alert.     Coordination: Coordination normal.     Deep Tendon Reflexes: Reflexes are normal and symmetric. Reflexes normal.  Psychiatric:        Mood and  Affect: Mood normal.           Assessment & Plan:   Problem List Items Addressed This Visit       Cardiovascular and Mediastinum   DIASTOLIC DYSFUNCTION    Taking lasix 40 mg daily  C/o ankle swelling all the time       Essential hypertension    bp in fair control at this time  BP Readings from Last 1 Encounters:  12/11/22 (!) 141/88   Most recent labs reviewed  Disc lifstyle change with low sodium diet and exercise  Not at goal but improved from last visit  Continues  Losartan 50 mg daily  Lasix 40 mg daily (also for DD)  Renal labs today      Relevant Orders   Basic metabolic panel     Endocrine   Hyperlipidemia associated with type 2 diabetes mellitus (HCC)    Disc goals for lipids and reasons to control them Rev last labs with pt Rev low sat fat diet in detail  LDL is well controlled with simvastatin 40 mg daily  Well tolerated  Trig improved Will continue to work on diet also       Relevant Medications   Semaglutide,0.25 or 0.5MG /DOS, (OZEMPIC, 0.25 OR 0.5 MG/DOSE,) 2 MG/3ML SOPN   Type II diabetes mellitus with renal manifestations (HCC) - Primary    Lab Results  Component Value Date   HGBA1C 7.0 (A) 11/08/2022   She is only taking metformin once daily due to nausea with it  Bmet today-will advise further  Glipizide xl 10 mg daily   Willing to try GLP med  Disc option of GLP medication including possible side effects like GI intolerance and risk of thyroid and endocrine cancer, pancreatitis and gallstones, kidney problems and diabetic retinopathy Given handout   Sent semaglutide 0.25 ot pharmacy  Hope it will help with weight loss also  She will call once approved and starting  Instructed to call if side effects as well   May be able to back off of metformin (renal) or glipizide (risk of hypoglycemia )if this works out  Asked pt to check glucose daily (different times) and keep a log Commended better diet and weight loss so far  Limited  exercise due to ortho issues /back and joints   Next A1c due in nov       Relevant Medications   Semaglutide,0.25 or 0.5MG /DOS, (OZEMPIC, 0.25 OR 0.5 MG/DOSE,) 2 MG/3ML SOPN     Other   Decreased GFR    Last time 49.69 Re check today  Losartan and lasix   Has cut metformin to once daily due to nausea also   Pending result for plan       Relevant Orders   Basic metabolic panel   Other Visit Diagnoses     Need for influenza vaccination       Relevant Orders   Flu Vaccine Trivalent High Dose (Fluad) (Completed)

## 2022-12-12 ENCOUNTER — Telehealth: Payer: Self-pay | Admitting: Family Medicine

## 2022-12-12 NOTE — Telephone Encounter (Signed)
Pt called to let Dr. Milinda Antis know that she received the meds, prescribed Semaglutide,0.25 or 0.5MG /DOS, (OZEMPIC, 0.25 OR 0.5 MG/DOSE,) 2 MG/3ML SOPN, on today, 9/17. Call back # [phone].

## 2022-12-12 NOTE — Telephone Encounter (Signed)
That's excellent ! Go ahead and start it one shot weekly  Watch your glucose and if you get low readings (70s or below) let me know and we will cut the glipizide dose   In 3 weeks when time for last shot let us know how you are tolerating it  We may go up on the dose after this month if doing well and depending on glucose control   If any intolerable side effects in the meant time let me know

## 2022-12-13 ENCOUNTER — Encounter: Payer: Self-pay | Admitting: Family Medicine

## 2022-12-13 NOTE — Telephone Encounter (Signed)
Sent mychart letting pt know Dr. Royden Purl comments.

## 2023-01-08 MED ORDER — GLIPIZIDE ER 5 MG PO TB24: 5.0000 mg | ORAL_TABLET | Freq: Every day | ORAL | 1 refills | Status: DC

## 2023-01-08 MED ORDER — OZEMPIC (0.25 OR 0.5 MG/DOSE) 2 MG/3ML ~~LOC~~ SOPN: 0.5000 mg | PEN_INJECTOR | SUBCUTANEOUS | 0 refills | Status: DC

## 2023-01-08 NOTE — Addendum Note (Signed)
Addended by: Roxy Manns A on: 01/08/2023 08:31 PM   Modules accepted: Orders

## 2023-02-06 ENCOUNTER — Encounter: Payer: Self-pay | Admitting: Family Medicine

## 2023-02-07 ENCOUNTER — Encounter: Payer: Self-pay | Admitting: Family Medicine

## 2023-02-08 MED ORDER — SEMAGLUTIDE (1 MG/DOSE) 4 MG/3ML ~~LOC~~ SOPN: 1.0000 mg | PEN_INJECTOR | SUBCUTANEOUS | 1 refills | Status: DC

## 2023-02-12 DIAGNOSIS — L821 Other seborrheic keratosis: Secondary | ICD-10-CM | POA: Diagnosis not present

## 2023-02-12 DIAGNOSIS — D2262 Melanocytic nevi of left upper limb, including shoulder: Secondary | ICD-10-CM | POA: Diagnosis not present

## 2023-02-12 DIAGNOSIS — D2261 Melanocytic nevi of right upper limb, including shoulder: Secondary | ICD-10-CM | POA: Diagnosis not present

## 2023-02-12 DIAGNOSIS — D2271 Melanocytic nevi of right lower limb, including hip: Secondary | ICD-10-CM | POA: Diagnosis not present

## 2023-02-12 DIAGNOSIS — D225 Melanocytic nevi of trunk: Secondary | ICD-10-CM | POA: Diagnosis not present

## 2023-02-12 DIAGNOSIS — D2272 Melanocytic nevi of left lower limb, including hip: Secondary | ICD-10-CM | POA: Diagnosis not present

## 2023-03-18 ENCOUNTER — Other Ambulatory Visit: Payer: Self-pay | Admitting: Family Medicine

## 2023-04-05 ENCOUNTER — Other Ambulatory Visit: Payer: Self-pay | Admitting: Family Medicine

## 2023-04-05 NOTE — Telephone Encounter (Signed)
 I sent it  Please schedule follow up  Thanks

## 2023-04-05 NOTE — Telephone Encounter (Signed)
 Last filled on 02/08/23 # 3 mL 1 refill, last OV was a f/u on 12/11/22

## 2023-04-05 NOTE — Telephone Encounter (Signed)
 How is she doing with it?  Does she want to increase the dose?

## 2023-04-05 NOTE — Telephone Encounter (Signed)
 Pt said since starting med she was dealing with some nausea and constipation. The last few days she has started taking a stool softner and her sxs have gotten better. Pt said she wants to stay on this dose she is on for this month because she is due for her shot tomorrow and doesn't want to miss it. However pt is asking if she should see PCP or just come in for labs. She wants her blood sugar checked and her kidney function checked. Pt said other then that the nausea/ constipation has resolved with a stool softner

## 2023-04-06 NOTE — Telephone Encounter (Signed)
 Patient has been scheduled

## 2023-04-12 ENCOUNTER — Encounter: Payer: Self-pay | Admitting: Family Medicine

## 2023-04-12 ENCOUNTER — Ambulatory Visit (INDEPENDENT_AMBULATORY_CARE_PROVIDER_SITE_OTHER): Payer: Medicare Other | Admitting: Family Medicine

## 2023-04-12 VITALS — BP 130/84 | HR 94 | Temp 97.5°F | Ht 59.5 in | Wt 183.1 lb

## 2023-04-12 DIAGNOSIS — I5032 Chronic diastolic (congestive) heart failure: Secondary | ICD-10-CM | POA: Diagnosis not present

## 2023-04-12 DIAGNOSIS — S31809A Unspecified open wound of unspecified buttock, initial encounter: Secondary | ICD-10-CM | POA: Diagnosis not present

## 2023-04-12 DIAGNOSIS — E1122 Type 2 diabetes mellitus with diabetic chronic kidney disease: Secondary | ICD-10-CM

## 2023-04-12 DIAGNOSIS — R944 Abnormal results of kidney function studies: Secondary | ICD-10-CM

## 2023-04-12 DIAGNOSIS — E66812 Obesity, class 2: Secondary | ICD-10-CM

## 2023-04-12 DIAGNOSIS — E785 Hyperlipidemia, unspecified: Secondary | ICD-10-CM

## 2023-04-12 DIAGNOSIS — E1169 Type 2 diabetes mellitus with other specified complication: Secondary | ICD-10-CM | POA: Diagnosis not present

## 2023-04-12 DIAGNOSIS — I10 Essential (primary) hypertension: Secondary | ICD-10-CM

## 2023-04-12 DIAGNOSIS — Z6836 Body mass index (BMI) 36.0-36.9, adult: Secondary | ICD-10-CM

## 2023-04-12 DIAGNOSIS — K5900 Constipation, unspecified: Secondary | ICD-10-CM | POA: Insufficient documentation

## 2023-04-12 DIAGNOSIS — N182 Chronic kidney disease, stage 2 (mild): Secondary | ICD-10-CM

## 2023-04-12 DIAGNOSIS — K5903 Drug induced constipation: Secondary | ICD-10-CM | POA: Diagnosis not present

## 2023-04-12 LAB — POCT GLYCOSYLATED HEMOGLOBIN (HGB A1C): Hemoglobin A1C: 5.7 % — AB (ref 4.0–5.6)

## 2023-04-12 LAB — BASIC METABOLIC PANEL
BUN: 17 mg/dL (ref 6–23)
CO2: 27 meq/L (ref 19–32)
Calcium: 10.1 mg/dL (ref 8.4–10.5)
Chloride: 104 meq/L (ref 96–112)
Creatinine, Ser: 1.06 mg/dL (ref 0.40–1.20)
GFR: 47.6 mL/min — ABNORMAL LOW (ref >60.00)
Glucose, Bld: 113 mg/dL — ABNORMAL HIGH (ref 70–99)
Potassium: 3.8 meq/L (ref 3.5–5.1)
Sodium: 140 meq/L (ref 135–145)

## 2023-04-12 NOTE — Assessment & Plan Note (Signed)
Lab Results  Component Value Date   HGBA1C 5.7 (A) 04/12/2023   HGBA1C 7.0 (A) 11/08/2022   HGBA1C 7.5 (H) 11/01/2022   Much improved with addition of ozempic/ now on semagludide 1 mg weekly  Some nausea that occurs with constipation -see a/p for management of that  Wants to continue this dose unless symptoms worsen Significant weight loss noted causing decrease in joint pain and decreased need for nsaids  Also continues Metformin 1000 mg daily  Glipizide xl 5 mg daily   Instructed to stop glipizide Continue low glycemic diet  Add muscle strengthening exercise to prevent muscle loss and loss of balance and bone density  Increase fluids Follow up in may or earlier if needed Bmet today

## 2023-04-12 NOTE — Assessment & Plan Note (Signed)
Continues lasix 40  No clinical changes  No c/o shortness of breath or ankle swelling

## 2023-04-12 NOTE — Assessment & Plan Note (Addendum)
bp in fair control at this time  Some improvement with weight loss   BP Readings from Last 1 Encounters:  04/12/23 130/84   No changes needed Most recent labs reviewed  Disc lifstyle change with low sodium diet and exercise   Continues losartan 50 mg daily  Lasix daily for this and diastolic dysfunctoin   Bmet today  Less nsaid recently since she lost weight and joints are less painful

## 2023-04-12 NOTE — Assessment & Plan Note (Signed)
Significant weight loss with semaglutide Less pain in joints/less need for nsaid  Commended   Discussed how this problem influences overall health and the risks it imposes  Reviewed plan for weight loss with lower calorie diet (via better food choices (lower glycemic and portion control) along with exercise building up to or more than 30 minutes 5 days per week including some aerobic activity and strength training   Encouraged to add strength building activity to prevent muscle loss

## 2023-04-12 NOTE — Patient Instructions (Addendum)
Try over the counter mirlax for constipation (store brand is cheaper)  Use 1-3 servings per day to start - within a week you will know how much you need   Make sure you drink enough fluid/ water   Also fruits and veggies  Make sure you get enough fiber in diet   Stop the glipizide entirely    To prevent muscle loss Keep moving/stay active  Add some strength training to your routine, this is important for bone and brain health and can reduce your risk of falls and help your body use insulin properly and regulate weight  Light weights, exercise bands , and internet videos are a good way to start  Yoga (chair or regular), machines , floor exercises or a gym with machines are also good options   Picking up 15 lb counts   For kidney health- avoid aleve and ibuprofen - the less the better   Let us know if nausea does not continue to improve   Keep  the sores on your buttocks clean with soap and water Stop triamcinolone  Start a barrier cream like desitin or A and D (ointment or cream) -- will protect and help heal  A circle/donut pillow may help also when you sit -will offload the painful area   Try to sit less and move more when you can   If wounds do not improve please let us know   Lab today for kidney function

## 2023-04-12 NOTE — Assessment & Plan Note (Signed)
Occurring with semaglutide  Takes stool softener-sometimes then liquid stool is less controllable   Discussed increase in fluids  Discussed increase in fiber Try miralax instead of stool softener to see if this is better tolerated   In future could consider breaking up metformin to 500 bid (? If that would help since side effects is diarrhea)- currently takes 1000 mg daily   Pt will update if no improvement  Can consider dropping back from 1 to 0.05 of semaglutide if needed

## 2023-04-12 NOTE — Progress Notes (Signed)
Subjective:    Patient ID: Gabriella Howe, female    DOB: Jul 10, 1934, 89 y.o.   MRN: [id number]  HPI  Wt Readings from Last 3 Encounters:  04/12/23 183 lb 2 oz (83.1 kg)  12/11/22 201 lb 2 oz (91.2 kg)  11/08/22 204 lb 3.2 oz (92.6 kg)   36.37 kg/m  Vitals:   04/12/23 1059  BP: 130/84  Pulse: 94  Temp: (!) 97.5 F (36.4 C)  SpO2: 97%    Pt presents for follow up of DM2 and HTN  and chronic health problems  Sores on buttocks    Has lost weight  Has noticed some improvement with her joints -less pain  No longer needs much aleve    HTN bp is stable today  No cp or palpitations or headaches or edema  No side effects to medicines  BP Readings from Last 3 Encounters:  04/12/23 130/84  12/11/22 (!) 141/88  11/08/22 (!) 160/80    Losartan 50 mg daily  Lasix 40 mg daily for this and DD  Pulse Readings from Last 3 Encounters:  04/12/23 94  12/11/22 80  11/08/22 74   Lab Results  Component Value Date   NA 142 12/11/2022   K 4.3 12/11/2022   CO2 26 12/11/2022   GLUCOSE 127 (H) 12/11/2022   BUN 15 12/11/2022   CREATININE 0.95 12/11/2022   CALCIUM 9.7 12/11/2022   GFR 54.41 (L) 12/11/2022   GFRNONAA >60 10/29/2020   GFR 54.4 watching    Dm2 Lab Results  Component Value Date   HGBA1C 5.7 (A) 04/12/2023   HGBA1C 7.0 (A) 11/08/2022   HGBA1C 7.5 (H) 11/01/2022     Can tolerate metformin once daily due to GI side effects- 1000 mg daily in am  Glipizide xl 5 mg daily   GLP med - semaglutide started/ now on 1 mg weekly   Had some fatigue and nausea and constipation early on when she started it  Taking a stool softener (open to other suggestions)  Is adjusting to it   Has really cut out the sweets  Not as hard as she thought it would be   Much smaller portions   Hyperlipidemia Lab Results  Component Value Date   CHOL 120 08/03/2022   HDL 49.70 08/03/2022   LDLCALC 45 08/03/2022   TRIG 126.0 08/03/2022   CHOLHDL 2 08/03/2022   Simvastatin 40  mg daily  Tolerates well      Patient Active Problem List   Diagnosis Date Noted   Constipation 04/12/2023   Wound of buttock 04/12/2023   Decreased GFR 11/08/2022   Skin lesion 08/08/2022   Head pain 02/13/2022   Neck pain 02/13/2022   Current use of proton pump inhibitor 10/19/2021   Kidney stones 11/02/2020   Asthma 10/23/2020   CKD (chronic kidney disease), stage II 10/23/2020   Pedal edema 08/05/2019   DJD (degenerative joint disease) 02/26/2019   Iron deficiency anemia 04/30/2017   History of shingles 10/03/2016   Routine general medical examination at a health care facility 04/14/2015   Knee pain 07/28/2014   Incomplete emptying of bladder 11/28/2012   Encounter for Medicare annual wellness exam 11/19/2012   History of kidney stones 11/19/2012   Fatigue 06/07/2011   DIASTOLIC HEART FAILURE, CHRONIC 07/29/2009   Hyperlipidemia associated with type 2 diabetes mellitus (HCC) 09/13/2006   Class 2 obesity due to excess calories with body mass index (BMI) of 36.0 to 36.9 in adult 09/10/2006   CARPAL TUNNEL  SYNDROME 09/10/2006   Essential hypertension 09/10/2006   DIASTOLIC DYSFUNCTION 09/10/2006   Asthma 09/10/2006   GERD 09/10/2006   ROSACEA 09/10/2006   Degeneration of lumbar or lumbosacral intervertebral disc 09/10/2006   OSA (obstructive sleep apnea) 09/10/2006   History of colonic polyps 09/10/2006   Type II diabetes mellitus with renal manifestations (HCC) 09/06/2006   Past Medical History:  Diagnosis Date   Adenomatous colon polyp 2003   Benign neoplasm of colon    Carpal tunnel syndrome    much better   Chest pain, unspecified    CHF (congestive heart failure) (HCC)    diastolic   Complication of anesthesia    careful in positioning due to back pain   Degeneration of intervertebral disc, site unspecified    Diabetes mellitus    type II   Encounter for long-term (current) use of other medications    Fluid overload    GERD (gastroesophageal reflux  disease)    Heart disease, unspecified    History of kidney stones    with stent/hospitalized   Hyperlipidemia    Hypertension    Obesity, unspecified    Rosacea    Shortness of breath    Unspecified asthma(493.90)    Unspecified sleep apnea    no cpap/ sleeps sitting up   Past Surgical History:  Procedure Laterality Date   ABDOMINAL HYSTERECTOMY     partial, fibroids   BOTOX INJECTION N/A 02/09/2020   Procedure: BOTOX INJECTION;  Surgeon: Vanna Scotland, MD;  Location: [facility];  Service: Urology;  Laterality: N/A;   CYSTOSCOPY WITH STENT PLACEMENT Left 02/18/2019   Procedure: CYSTOSCOPY WITH STENT PLACEMENT;  Surgeon: Riki Altes, MD;  Location: [facility];  Service: Urology;  Laterality: Left;   CYSTOSCOPY/URETEROSCOPY/HOLMIUM LASER/STENT PLACEMENT Left 04/01/2019   Procedure: CYSTOSCOPY/URETEROSCOPY/HOLMIUM LASER/STENT PLACEMENT;  Surgeon: Riki Altes, MD;  Location: [facility];  Service: Urology;  Laterality: Left;   ESOPHAGOGASTRODUODENOSCOPY  2000   TONSILLECTOMY     UMBILICAL HERNIA REPAIR  01/2004   URETERAL STENT PLACEMENT  2/13   for L sided stone/ ecoli sepsis   Social History   Tobacco Use   Smoking status: Never    Passive exposure: Past   Smokeless tobacco: Never  Vaping Use   Vaping status: Never Used  Substance Use Topics   Alcohol use: No    Alcohol/week: 0.0 standard drinks of alcohol   Drug use: No   Family History  Problem Relation Age of Onset   Early death Father 59       train accident   Diabetes Mother    Heart disease Mother    Diabetes Sister    Diabetes Brother    Colon cancer Brother    Cancer Paternal Grandmother        unknown   Diabetes Sister    Allergies  Allergen Reactions   Rofecoxib     REACTION: reaction not known   Antihistamines, Chlorpheniramine-Type Palpitations   Sulfa Antibiotics Rash   Tetanus-Diphtheria Toxoids Td Other (See Comments)    REACTION: sick with fever   Current Outpatient Medications on File  Prior to Visit  Medication Sig Dispense Refill   Accu-Chek Softclix Lancets lancets USE AS DIRECTED TO CHECK BLOOD SUGAR ONCE DAILY AND AS NEEDED. 100 each 3   Aspirin-Caffeine (BC FAST PAIN RELIEF PO) Take 1 packet by mouth daily as needed (headache).     Cholecalciferol (VITAMIN D3) 50 MCG (2000 UT) capsule Take 1 capsule (2,000 Units total) by mouth  daily. 90 capsule 3   Cyanocobalamin (B-12 PO) Take by mouth.     furosemide (LASIX) 40 MG tablet Take 1 tablet (40 mg total) by mouth daily. 100 tablet 1   glucose blood (ACCU-CHEK AVIVA PLUS) test strip USE AS DIRECTED TO CHECK BLOOD SUGAR ONCE DAILY AND AS NEEDED. 90 each 3   losartan (COZAAR) 50 MG tablet TAKE 1 TABLET BY MOUTH DAILY 100 tablet 1   metFORMIN (GLUCOPHAGE) 1000 MG tablet TAKE 1 TABLET BY MOUTH TWICE  DAILY WITH MEALS (Patient taking differently: Take 1,000 mg by mouth daily with breakfast.) 200 tablet 1   Multiple Vitamin (MULTIVITAMIN) capsule Take 1 capsule by mouth daily.     omeprazole (PRILOSEC) 20 MG capsule TAKE 1 CAPSULE BY MOUTH DAILY 100 capsule 1   Pseudoephedrine HCl (SINUS & ALLERGY 12 HOUR PO) Take 1 tablet by mouth daily.      Semaglutide, 1 MG/DOSE, (OZEMPIC, 1 MG/DOSE,) 4 MG/3ML SOPN INJECT ONE MG UNDER THE SKIN ONCE A WEEK. 3 mL 2   simvastatin (ZOCOR) 40 MG tablet TAKE 1 TABLET BY MOUTH AT  BEDTIME 100 tablet 1   triamcinolone cream (KENALOG) 0.1 % APPLY TO AFFECTED AREA(S)  TOPICALLY TWO TIMES A DAY 30 g 1   No current facility-administered medications on file prior to visit.    Review of Systems  Constitutional:  Positive for fatigue. Negative for activity change, appetite change, fever and unexpected weight change.  HENT:  Negative for congestion, ear pain, rhinorrhea, sinus pressure and sore throat.   Eyes:  Negative for pain, redness and visual disturbance.  Respiratory:  Negative for cough, shortness of breath and wheezing.   Cardiovascular:  Negative for chest pain and palpitations.   Gastrointestinal:  Positive for constipation and nausea. Negative for abdominal pain, blood in stool, diarrhea and vomiting.  Endocrine: Negative for polydipsia and polyuria.  Genitourinary:  Negative for dysuria, frequency and urgency.  Musculoskeletal:  Positive for arthralgias. Negative for back pain and myalgias.  Skin:  Negative for pallor and rash.  Allergic/Immunologic: Negative for environmental allergies.  Neurological:  Negative for dizziness, syncope and headaches.  Hematological:  Negative for adenopathy. Does not bruise/bleed easily.  Psychiatric/Behavioral:  Negative for decreased concentration and dysphoric mood. The patient is not nervous/anxious.        Objective:   Physical Exam Constitutional:      General: She is not in acute distress.    Appearance: Normal appearance. She is well-developed. She is obese. She is not ill-appearing or diaphoretic.     Comments: Weight loss noted   HENT:     Head: Normocephalic and atraumatic.  Eyes:     Conjunctiva/sclera: Conjunctivae normal.     Pupils: Pupils are equal, round, and reactive to light.  Neck:     Thyroid: No thyromegaly.     Vascular: No carotid bruit or JVD.  Cardiovascular:     Rate and Rhythm: Normal rate and regular rhythm.     Heart sounds: Normal heart sounds.     No gallop.  Pulmonary:     Effort: Pulmonary effort is normal. No respiratory distress.     Breath sounds: Normal breath sounds. No wheezing or rales.  Abdominal:     General: Abdomen is protuberant. There is no distension or abdominal bruit.     Palpations: Abdomen is soft. There is no hepatomegaly, splenomegaly, mass or pulsatile mass.     Tenderness: There is no abdominal tenderness. There is no right CVA tenderness, left CVA  tenderness, guarding or rebound.     Hernia: No hernia is present.  Musculoskeletal:     Cervical back: Normal range of motion and neck supple.     Right lower leg: No edema.     Left lower leg: No edema.   Lymphadenopathy:     Cervical: No cervical adenopathy.  Skin:    General: Skin is warm and dry.     Coloration: Skin is not pale.     Findings: No rash.     Comments: 2 small sacral wounds (one on each side) with granulation tissue No erythema  Little to no tenderness No discharge Less than 0.5 cm each   Neurological:     Mental Status: She is alert.     Coordination: Coordination normal.     Deep Tendon Reflexes: Reflexes are normal and symmetric. Reflexes normal.     Comments: Gait is improved Uses cane   Psychiatric:        Mood and Affect: Mood normal.           Assessment & Plan:   Problem List Items Addressed This Visit       Cardiovascular and Mediastinum   Essential hypertension   bp in fair control at this time  Some improvement with weight loss   BP Readings from Last 1 Encounters:  04/12/23 130/84   No changes needed Most recent labs reviewed  Disc lifstyle change with low sodium diet and exercise   Continues losartan 50 mg daily  Lasix daily for this and diastolic dysfunctoin   Bmet today  Less nsaid recently since she lost weight and joints are less painful       Relevant Orders   Basic metabolic panel   DIASTOLIC HEART FAILURE, CHRONIC   Continues lasix 40  No clinical changes  No c/o shortness of breath or ankle swelling         Endocrine   Type II diabetes mellitus with renal manifestations (HCC) - Primary   Lab Results  Component Value Date   HGBA1C 5.7 (A) 04/12/2023   HGBA1C 7.0 (A) 11/08/2022   HGBA1C 7.5 (H) 11/01/2022   Much improved with addition of ozempic/ now on semagludide 1 mg weekly  Some nausea that occurs with constipation -see a/p for management of that  Wants to continue this dose unless symptoms worsen Significant weight loss noted causing decrease in joint pain and decreased need for nsaids  Also continues Metformin 1000 mg daily  Glipizide xl 5 mg daily   Instructed to stop glipizide Continue low  glycemic diet  Add muscle strengthening exercise to prevent muscle loss and loss of balance and bone density  Increase fluids Follow up in may or earlier if needed Bmet today      Relevant Orders   POCT HgB A1C (Completed)   Hyperlipidemia associated with type 2 diabetes mellitus (HCC)   Disc goals for lipids and reasons to control them Rev last labs with pt Rev low sat fat diet in detail  LDL is well controlled with simvastatin 40 mg daily  Well tolerated  Trig improved LDL of 45  Will continue to work on diet also         Genitourinary   CKD (chronic kidney disease), stage II   Last GFR 54.4  Now on less metformin  Also DM is better controlled  Taking less nsaid due to weight loss and less joint pain   Re check today  Hope for continued improvement  Discussed fluid intake and importance to renal health  May be drinking less because eating less No uti symptoms         Other   Wound of buttock   Pt has 2 wounds / possible early shallow ulcers/sacral possibly from sitting with her job   Using triamcinolone-this is worsening problem and preventing healing   Instructed to keep areas clean and dry  Change to barrier cream as needed instead of triamcinolone Try donut pillow or other means to offload area when sitting Try to sit less Continue to monitor closely and update if worse   Call back and Er precautions noted in detail today        Decreased GFR   Relevant Orders   Basic metabolic panel   Constipation   Occurring with semaglutide  Takes stool softener-sometimes then liquid stool is less controllable   Discussed increase in fluids  Discussed increase in fiber Try miralax instead of stool softener to see if this is better tolerated   In future could consider breaking up metformin to 500 bid (? If that would help since side effects is diarrhea)- currently takes 1000 mg daily   Pt will update if no improvement  Can consider dropping back from 1 to  0.05 of semaglutide if needed        Class 2 obesity due to excess calories with body mass index (BMI) of 36.0 to 36.9 in adult   Significant weight loss with semaglutide Less pain in joints/less need for nsaid  Commended   Discussed how this problem influences overall health and the risks it imposes  Reviewed plan for weight loss with lower calorie diet (via better food choices (lower glycemic and portion control) along with exercise building up to or more than 30 minutes 5 days per week including some aerobic activity and strength training   Encouraged to add strength building activity to prevent muscle loss

## 2023-04-12 NOTE — Assessment & Plan Note (Signed)
Disc goals for lipids and reasons to control them Rev last labs with pt Rev low sat fat diet in detail  LDL is well controlled with simvastatin 40 mg daily  Well tolerated  Trig improved LDL of 45  Will continue to work on diet also

## 2023-04-12 NOTE — Assessment & Plan Note (Signed)
Last GFR 54.4  Now on less metformin  Also DM is better controlled  Taking less nsaid due to weight loss and less joint pain   Re check today  Hope for continued improvement   Discussed fluid intake and importance to renal health  May be drinking less because eating less No uti symptoms

## 2023-04-12 NOTE — Assessment & Plan Note (Signed)
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

## 2023-04-19 ENCOUNTER — Other Ambulatory Visit: Payer: Self-pay | Admitting: Family Medicine

## 2023-06-28 ENCOUNTER — Other Ambulatory Visit: Payer: Self-pay | Admitting: Family Medicine

## 2023-06-28 NOTE — Telephone Encounter (Signed)
 Last filled on 04/05/23 #3 mL/ 2 refills, CPE scheduled 08/10/23

## 2023-07-23 DIAGNOSIS — E119 Type 2 diabetes mellitus without complications: Secondary | ICD-10-CM | POA: Diagnosis not present

## 2023-07-23 DIAGNOSIS — Z961 Presence of intraocular lens: Secondary | ICD-10-CM | POA: Diagnosis not present

## 2023-07-23 LAB — HM DIABETES EYE EXAM

## 2023-07-29 ENCOUNTER — Other Ambulatory Visit: Payer: Self-pay | Admitting: Family Medicine

## 2023-07-30 ENCOUNTER — Ambulatory Visit (INDEPENDENT_AMBULATORY_CARE_PROVIDER_SITE_OTHER): Payer: Medicare Other

## 2023-07-30 VITALS — BP 130/84 | Ht 59.5 in | Wt 160.0 lb

## 2023-07-30 DIAGNOSIS — Z0000 Encounter for general adult medical examination without abnormal findings: Secondary | ICD-10-CM | POA: Diagnosis not present

## 2023-07-30 NOTE — Patient Instructions (Signed)
 Ms. Alsbury , Thank you for taking time to come for your Medicare Wellness Visit. I appreciate your ongoing commitment to your health goals. Please review the following plan we discussed and let me know if I can assist you in the future.   Referrals/Orders/Follow-Ups/Clinician Recommendations: follow up as scheduled  This is a list of the screening recommended for you and due dates:  Health Maintenance  Topic Date Due   Zoster (Shingles) Vaccine (1 of 2) Never done   Complete foot exam   05/12/2022   COVID-19 Vaccine (5 - 2024-25 season) 11/26/2022   Eye exam for diabetics  07/18/2023   Hemoglobin A1C  10/10/2023   Flu Shot  10/26/2023   Medicare Annual Wellness Visit  07/29/2024   Pneumonia Vaccine  Completed   DEXA scan (bone density measurement)  Completed   HPV Vaccine  Aged Out   Meningitis B Vaccine  Aged Out   DTaP/Tdap/Td vaccine  Discontinued    Advanced directives: (Copy Requested) Please bring a copy of your health care power of attorney and living will to the office to be added to your chart at your convenience. You can mail to [facility] [address] [place], [place] [address] or email to [email or web address]  Next Medicare Annual Wellness Visit scheduled for next year: Yes  Have you seen your provider in the last 6 months (3 months if uncontrolled diabetes)? Yes

## 2023-07-30 NOTE — Progress Notes (Signed)
 Because this visit was a virtual/telehealth visit,  certain criteria was not obtained, such a blood pressure, CBG if applicable, and timed get up and go. Any medications not marked as "taking" were not mentioned during the medication reconciliation part of the visit. Any vitals not documented were not able to be obtained due to this being a telehealth visit or patient was unable to self-report a recent blood pressure reading due to a lack of equipment at home via telehealth. Vitals that have been documented are verbally provided by the patient.  Subjective:   Gabriella Howe is a 89 y.o. who presents for a Medicare Wellness preventive visit.  Visit Complete: Virtual I connected with  Denetra Hirata Amos on 07/30/23 by a audio enabled telemedicine application and verified that I am speaking with the correct person using two identifiers.  Patient Location: Home  Provider Location: Home Office  I discussed the limitations of evaluation and management by telemedicine. The patient expressed understanding and agreed to proceed.  Vital Signs: Because this visit was a virtual/telehealth visit, some criteria may be missing or patient reported. Any vitals not documented were not able to be obtained and vitals that have been documented are patient reported.  VideoDeclined- This patient declined Librarian, academic. Therefore the visit was completed with audio only.  Persons Participating in Visit: Patient.  AWV Questionnaire: No: Patient Medicare AWV questionnaire was not completed prior to this visit.  Cardiac Risk Factors include: advanced age (>36men, >23 women);diabetes mellitus;obesity (BMI >30kg/m2);hypertension     Objective:    Today's Vitals   07/30/23 1102  BP: 130/84  Weight: 160 lb (72.6 kg)  Height: 4' 11.5" (1.511 m)   Body mass index is 31.78 kg/m.     07/30/2023   11:01 AM 07/26/2022   11:14 AM 08/04/2021   11:28 AM 10/23/2020   11:31 PM 10/23/2020    11:29 PM 10/23/2020   11:31 AM 08/03/2020   12:01 PM  Advanced Directives  Does Patient Have a Medical Advance Directive? Yes Yes No Yes Yes No Yes  Type of Estate agent of [place];Living will Healthcare Power of [place];Living will  Healthcare Power of [employer] of [place];Living will  Does patient want to make changes to medical advance directive? No - Patient declined   No - Patient declined No - Patient declined    Copy of Healthcare Power of Attorney in Chart? No - copy requested No - copy requested  Yes - validated most recent copy scanned in chart (See row information)   No - copy requested  Would patient like information on creating a medical advance directive?   No - Patient declined        Current Medications (verified) Outpatient Encounter Medications as of 07/30/2023  Medication Sig   Accu-Chek Softclix Lancets lancets USE AS DIRECTED TO CHECK BLOOD SUGAR ONCE DAILY AND AS NEEDED.   Aspirin -Caffeine  (BC FAST PAIN RELIEF PO) Take 1 packet by mouth daily as needed (headache).   Cholecalciferol (VITAMIN D3) 50 MCG (2000 UT) capsule Take 1 capsule (2,000 Units total) by mouth daily.   Cyanocobalamin  (B-12 PO) Take by mouth.   furosemide  (LASIX ) 40 MG tablet TAKE 1 TABLET BY MOUTH DAILY   glucose blood (ACCU-CHEK AVIVA PLUS) test strip USE AS DIRECTED TO CHECK BLOOD SUGAR ONCE DAILY AND AS NEEDED.   losartan  (COZAAR ) 50 MG tablet TAKE 1 TABLET BY MOUTH DAILY   metFORMIN  (GLUCOPHAGE ) 1000 MG tablet TAKE 1 TABLET  BY MOUTH TWICE  DAILY WITH MEALS (Patient taking differently: Take 1,000 mg by mouth daily with breakfast.)   Multiple Vitamin (MULTIVITAMIN) capsule Take 1 capsule by mouth daily.   omeprazole  (PRILOSEC) 20 MG capsule TAKE 1 CAPSULE BY MOUTH DAILY   Pseudoephedrine HCl (SINUS & ALLERGY 12 HOUR PO) Take 1 tablet by mouth daily.    Semaglutide , 1 MG/DOSE, (OZEMPIC , 1 MG/DOSE,) 4 MG/3ML SOPN INJECT ONE MG UNDER THE SKIN ONCE A WEEK.    simvastatin  (ZOCOR ) 40 MG tablet TAKE 1 TABLET BY MOUTH AT  BEDTIME   triamcinolone  cream (KENALOG ) 0.1 % APPLY TO AFFECTED AREA(S)  TOPICALLY TWO TIMES A DAY   No facility-administered encounter medications on file as of 07/30/2023.    Allergies (verified) Rofecoxib; Antihistamines, chlorpheniramine-type; Sulfa antibiotics; and Tetanus-diphtheria toxoids td   History: Past Medical History:  Diagnosis Date   Adenomatous colon polyp 2003   Benign neoplasm of colon    Carpal tunnel syndrome    much better   Chest pain, unspecified    CHF (congestive heart failure) (HCC)    diastolic   Complication of anesthesia    careful in positioning due to back pain   Degeneration of intervertebral disc, site unspecified    Diabetes mellitus    type II   Encounter for long-term (current) use of other medications    Fluid overload    GERD (gastroesophageal reflux disease)    Heart disease, unspecified    History of kidney stones    with stent/hospitalized   Hyperlipidemia    Hypertension    Obesity, unspecified    Rosacea    Shortness of breath    Unspecified asthma(493.90)    Unspecified sleep apnea    no cpap/ sleeps sitting up   Past Surgical History:  Procedure Laterality Date   ABDOMINAL HYSTERECTOMY     partial, fibroids   BOTOX  INJECTION N/A 02/09/2020   Procedure: BOTOX  INJECTION;  Surgeon: Dustin Gimenez, MD;  Location: [facility];  Service: Urology;  Laterality: N/A;   CYSTOSCOPY WITH STENT PLACEMENT Left 02/18/2019   Procedure: CYSTOSCOPY WITH STENT PLACEMENT;  Surgeon: Geraline Knapp, MD;  Location: [facility];  Service: Urology;  Laterality: Left;   CYSTOSCOPY/URETEROSCOPY/HOLMIUM LASER/STENT PLACEMENT Left 04/01/2019   Procedure: CYSTOSCOPY/URETEROSCOPY/HOLMIUM LASER/STENT PLACEMENT;  Surgeon: Geraline Knapp, MD;  Location: [facility];  Service: Urology;  Laterality: Left;   ESOPHAGOGASTRODUODENOSCOPY  2000   TONSILLECTOMY     UMBILICAL HERNIA REPAIR  01/2004   URETERAL  STENT PLACEMENT  2/13   for L sided stone/ ecoli sepsis   Family History  Problem Relation Age of Onset   Early death Father 34       train accident   Diabetes Mother    Heart disease Mother    Diabetes Sister    Diabetes Brother    Colon cancer Brother    Cancer Paternal Grandmother        unknown   Diabetes Sister    Social History   Socioeconomic History   Marital status: Widowed    Spouse name: Not on file   Number of children: Not on file   Years of education: Not on file   Highest education level: Not on file  Occupational History   Not on file  Tobacco Use   Smoking status: Never    Passive exposure: Past   Smokeless tobacco: Never  Vaping Use   Vaping status: Never Used  Substance and Sexual Activity   Alcohol use: No  Alcohol/week: 0.0 standard drinks of alcohol   Drug use: No   Sexual activity: Never  Other Topics Concern   Not on file  Social History Narrative   Not on file   Social Drivers of Health   Financial Resource Strain: Low Risk  (07/30/2023)   Overall Financial Resource Strain (CARDIA)    Difficulty of Paying Living Expenses: Not hard at all  Food Insecurity: No Food Insecurity (07/30/2023)   Hunger Vital Sign    Worried About Running Out of Food in the Last Year: Never true    Ran Out of Food in the Last Year: Never true  Transportation Needs: No Transportation Needs (07/30/2023)   PRAPARE - Administrator, Civil Service (Medical): No    Lack of Transportation (Non-Medical): No  Physical Activity: Insufficiently Active (07/30/2023)   Exercise Vital Sign    Days of Exercise per Week: 2 days    Minutes of Exercise per Session: 20 min  Stress: No Stress Concern Present (07/30/2023)   [employer] of Occupational Health - Occupational Stress Questionnaire    Feeling of Stress : Not at all  Social Connections: Moderately Isolated (07/30/2023)   Social Connection and Isolation Panel [NHANES]    Frequency of Communication with  Friends and Family: More than three times a week    Frequency of Social Gatherings with Friends and Family: More than three times a week    Attends Religious Services: More than 4 times per year    Active Member of [employer] or Organizations: No    Attends Banker Meetings: Never    Marital Status: Widowed    Tobacco Counseling Counseling given: Not Answered    Clinical Intake:  Pre-visit preparation completed: Yes  Pain : No/denies pain     BMI - recorded: 31.78 Nutritional Status: BMI > 30  Obese Nutritional Risks: None Diabetes: Yes CBG done?: No Did pt. bring in CBG monitor from home?: No  Lab Results  Component Value Date   HGBA1C 5.7 (A) 04/12/2023   HGBA1C 7.0 (A) 11/08/2022   HGBA1C 7.5 (H) 11/01/2022     How often do you need to have someone help you when you read instructions, pamphlets, or other written materials from your doctor or pharmacy?: 1 - Never What is the last grade level you completed in school?: Some college  Interpreter Needed?: No  Information entered by :: Juliann Ochoa   Activities of Daily Living     07/30/2023   11:07 AM  In your present state of health, do you have any difficulty performing the following activities:  Hearing? 0  Vision? 0  Difficulty concentrating or making decisions? 0  Walking or climbing stairs? 0  Dressing or bathing? 0  Doing errands, shopping? 0  Preparing Food and eating ? N  Using the Toilet? N  In the past six months, have you accidently leaked urine? Y  Do you have problems with loss of bowel control? Y  Managing your Medications? N  Managing your Finances? N  Housekeeping or managing your Housekeeping? N    Patient Care Team: Tower, Manley Seeds, MD as PCP - General Vin-Parikh, Chandra Come, MD as Referring Physician (Ophthalmology) Burns Carwin, DDS as Referring Physician (Dentistry) Alta Ast, [facility] (Inactive) as Pharmacist (Pharmacist)  Indicate any recent Medical  Services you may have received from other than [facility] providers in the past year (date may be approximate).     Assessment:   This is a routine wellness  examination for [facility].  Hearing/Vision screen Hearing Screening - Comments:: No hearing difficulties Vision Screening - Comments:: Wears readers   Goals Addressed             This Visit's Progress    Patient Stated   On track    05/08/2019, I will maintain and continue medications as prescribed.       Depression Screen     07/30/2023   11:09 AM 04/12/2023   11:09 AM 12/11/2022    2:01 PM 07/26/2022   11:13 AM 10/19/2021    2:34 PM 08/04/2021   11:26 AM 08/03/2020   12:03 PM  PHQ 2/9 Scores  PHQ - 2 Score 0 0 2 0 0 0 0  PHQ- 9 Score 2 1 4     0    Fall Risk     07/30/2023   11:06 AM 04/12/2023   11:09 AM 12/11/2022    2:01 PM 07/26/2022    8:44 AM 10/19/2021    2:34 PM  Fall Risk   Falls in the past year? 0 0 1 1 0  Number falls in past yr: 0 0 0 1   Injury with Fall? 0 0 0 0   Risk for fall due to : No Fall Risks No Fall Risks History of fall(s)    Follow up Falls prevention discussed;Falls evaluation completed Falls evaluation completed Falls evaluation completed Falls evaluation completed;Education provided;Falls prevention discussed Falls evaluation completed    MEDICARE RISK AT HOME:  Medicare Risk at Home Any stairs in or around the home?: Yes If so, are there any without handrails?: No Home free of loose throw rugs in walkways, pet beds, electrical cords, etc?: Yes Adequate lighting in your home to reduce risk of falls?: Yes Life alert?: No Use of a cane, walker or w/c?: Yes (cane for outside) Grab bars in the bathroom?: Yes Shower chair or bench in shower?: Yes Elevated toilet seat or a handicapped toilet?: Yes  TIMED UP AND GO:  Was the test performed?  No  Cognitive Function: 6CIT completed    08/03/2020   12:05 PM 05/08/2019   10:41 AM 05/06/2018    9:49 AM 04/23/2017    8:25 AM 04/20/2016    1:05 PM   MMSE - Mini Mental State Exam  Orientation to time 5 5 5 5 5   Orientation to Place 5 5 5 5 5   Registration 3 3 3 3 3   Attention/ Calculation 5 5 0 0 0  Recall 3 3 2 3 2   Recall-comments   unable to recall 2 of 3 words  pt was unable to recall 1 of 3 words  Language- name 2 objects   0 0 0  Language- repeat 1 1 1 1 1   Language- follow 3 step command   3 3 3   Language- read & follow direction   0 0 0  Write a sentence   0 0 0  Copy design   0 0 0  Total score   19 20 19         07/30/2023   11:04 AM 07/26/2022   11:23 AM  6CIT Screen  What Year? 0 points 0 points  What month? 0 points 0 points  What time? 0 points 0 points  Count back from 20 0 points 0 points  Months in reverse 0 points 2 points  Repeat phrase 0 points 0 points  Total Score 0 points 2 points    Immunizations Immunization History  Administered Date(s) Administered  Fluad Quad(high Dose 65+) 01/20/2020   Fluad Trivalent(High Dose 65+) 12/11/2022   Influenza Split 01/24/2011   Influenza Whole 05/10/2009   Influenza,inj,Quad PF,6+ Mos 11/28/2013, 04/14/2015, 04/20/2016, 01/26/2017, 05/06/2018   Influenza-Unspecified 04/27/2022   Moderna Covid-19 Fall Seasonal Vaccine 16yrs & older 04/27/2022   PFIZER(Purple Top)SARS-COV-2 Vaccination 05/02/2019, 05/23/2019, 03/16/2020   Pneumococcal Conjugate-13 11/28/2013   Pneumococcal Polysaccharide-23 03/28/2003    Screening Tests Health Maintenance  Topic Date Due   Zoster Vaccines- Shingrix (1 of 2) Never done   FOOT EXAM  05/12/2022   COVID-19 Vaccine (5 - 2024-25 season) 11/26/2022   OPHTHALMOLOGY EXAM  07/18/2023   HEMOGLOBIN A1C  10/10/2023   INFLUENZA VACCINE  10/26/2023   Medicare Annual Wellness (AWV)  07/29/2024   Pneumonia Vaccine 53+ Years old  Completed   DEXA SCAN  Completed   HPV VACCINES  Aged Out   Meningococcal B Vaccine  Aged Out   DTaP/Tdap/Td  Discontinued    Health Maintenance  Health Maintenance Due  Topic Date Due   Zoster  Vaccines- Shingrix (1 of 2) Never done   FOOT EXAM  05/12/2022   COVID-19 Vaccine (5 - 2024-25 season) 11/26/2022   OPHTHALMOLOGY EXAM  07/18/2023   Health Maintenance Items Addressed: patient states she will bring the last eye exam because last week. She declined vaccines  Additional Screening:  Vision Screening: Recommended annual ophthalmology exams for early detection of glaucoma and other disorders of the eye.  Dental Screening: Recommended annual dental exams for proper oral hygiene  Community Resource Referral / Chronic Care Management: CRR required this visit?  No   CCM required this visit?  No     Plan:     I have personally reviewed and noted the following in the patient's chart:   Medical and social history Use of alcohol, tobacco or illicit drugs  Current medications and supplements including opioid prescriptions. Patient is not currently taking opioid prescriptions. Functional ability and status Nutritional status Physical activity Advanced directives List of other physicians Hospitalizations, surgeries, and ER visits in previous 12 months Vitals Screenings to include cognitive, depression, and falls Referrals and appointments  In addition, I have reviewed and discussed with patient certain preventive protocols, quality metrics, and best practice recommendations. A written personalized care plan for preventive services as well as general preventive health recommendations were provided to patient.     Freeda Jerry, [place]   07/30/2023   After Visit Summary: (MyChart) Due to this being a telephonic visit, the after visit summary with patients personalized plan was offered to patient via MyChart   Notes: Nothing significant to report at this time.

## 2023-08-01 ENCOUNTER — Telehealth: Payer: Self-pay | Admitting: Family Medicine

## 2023-08-01 DIAGNOSIS — I10 Essential (primary) hypertension: Secondary | ICD-10-CM

## 2023-08-01 DIAGNOSIS — D509 Iron deficiency anemia, unspecified: Secondary | ICD-10-CM

## 2023-08-01 DIAGNOSIS — Z79899 Other long term (current) drug therapy: Secondary | ICD-10-CM

## 2023-08-01 DIAGNOSIS — N182 Chronic kidney disease, stage 2 (mild): Secondary | ICD-10-CM

## 2023-08-01 DIAGNOSIS — E1122 Type 2 diabetes mellitus with diabetic chronic kidney disease: Secondary | ICD-10-CM

## 2023-08-01 DIAGNOSIS — E1169 Type 2 diabetes mellitus with other specified complication: Secondary | ICD-10-CM

## 2023-08-01 NOTE — Telephone Encounter (Signed)
-----   Message from Gerry Krone sent at 07/12/2023  3:50 PM EDT ----- Regarding: Lab orders for Fri, 5.9.25 Patient is scheduled for CPX labs, please order future labs, Thanks , Anselmo Kings

## 2023-08-02 ENCOUNTER — Ambulatory Visit: Payer: Medicare Other | Admitting: Dermatology

## 2023-08-03 ENCOUNTER — Encounter (HOSPITAL_COMMUNITY): Payer: Self-pay

## 2023-08-03 ENCOUNTER — Other Ambulatory Visit (INDEPENDENT_AMBULATORY_CARE_PROVIDER_SITE_OTHER): Payer: Medicare Other

## 2023-08-03 DIAGNOSIS — Z79899 Other long term (current) drug therapy: Secondary | ICD-10-CM | POA: Diagnosis not present

## 2023-08-03 DIAGNOSIS — N182 Chronic kidney disease, stage 2 (mild): Secondary | ICD-10-CM | POA: Diagnosis not present

## 2023-08-03 DIAGNOSIS — E1169 Type 2 diabetes mellitus with other specified complication: Secondary | ICD-10-CM | POA: Diagnosis not present

## 2023-08-03 DIAGNOSIS — E785 Hyperlipidemia, unspecified: Secondary | ICD-10-CM | POA: Diagnosis not present

## 2023-08-03 DIAGNOSIS — E1122 Type 2 diabetes mellitus with diabetic chronic kidney disease: Secondary | ICD-10-CM

## 2023-08-03 DIAGNOSIS — I10 Essential (primary) hypertension: Secondary | ICD-10-CM

## 2023-08-03 DIAGNOSIS — D509 Iron deficiency anemia, unspecified: Secondary | ICD-10-CM

## 2023-08-03 LAB — CBC WITH DIFFERENTIAL/PLATELET
Basophils Absolute: 0 10*3/uL (ref 0.0–0.1)
Basophils Relative: 0.5 % (ref 0.0–3.0)
Eosinophils Absolute: 0.1 10*3/uL (ref 0.0–0.7)
Eosinophils Relative: 1.8 % (ref 0.0–5.0)
HCT: 44.8 % (ref 36.0–46.0)
Hemoglobin: 15 g/dL (ref 12.0–15.0)
Lymphocytes Relative: 21.7 % (ref 12.0–46.0)
Lymphs Abs: 1.6 10*3/uL (ref 0.7–4.0)
MCHC: 33.5 g/dL (ref 30.0–36.0)
MCV: 92.8 fl (ref 78.0–100.0)
Monocytes Absolute: 0.4 10*3/uL (ref 0.1–1.0)
Monocytes Relative: 6 % (ref 3.0–12.0)
Neutro Abs: 5 10*3/uL (ref 1.4–7.7)
Neutrophils Relative %: 70 % (ref 43.0–77.0)
Platelets: 202 10*3/uL (ref 150.0–400.0)
RBC: 4.82 Mil/uL (ref 3.87–5.11)
RDW: 13.3 % (ref 11.5–15.5)
WBC: 7.2 10*3/uL (ref 4.0–10.5)

## 2023-08-03 LAB — COMPREHENSIVE METABOLIC PANEL WITH GFR
ALT: 11 U/L (ref 0–35)
AST: 13 U/L (ref 0–37)
Albumin: 4.4 g/dL (ref 3.5–5.2)
Alkaline Phosphatase: 65 U/L (ref 39–117)
BUN: 18 mg/dL (ref 6–23)
CO2: 31 meq/L (ref 19–32)
Calcium: 10.1 mg/dL (ref 8.4–10.5)
Chloride: 102 meq/L (ref 96–112)
Creatinine, Ser: 0.91 mg/dL (ref 0.40–1.20)
GFR: 57.04 mL/min — ABNORMAL LOW (ref >60.00)
Glucose, Bld: 123 mg/dL — ABNORMAL HIGH (ref 70–99)
Potassium: 3.9 meq/L (ref 3.5–5.1)
Sodium: 143 meq/L (ref 135–145)
Total Bilirubin: 1.3 mg/dL — ABNORMAL HIGH (ref 0.2–1.2)
Total Protein: 6.8 g/dL (ref 6.0–8.3)

## 2023-08-03 LAB — HEMOGLOBIN A1C: Hgb A1c MFr Bld: 6.4 % (ref 4.6–6.5)

## 2023-08-03 LAB — LIPID PANEL
Cholesterol: 119 mg/dL (ref 0–200)
HDL: 51.5 mg/dL (ref >39.00)
LDL Cholesterol: 49 mg/dL (ref 0–99)
NonHDL: 67.85
Total CHOL/HDL Ratio: 2
Triglycerides: 93 mg/dL (ref 0.0–149.0)
VLDL: 18.6 mg/dL (ref 0.0–40.0)

## 2023-08-03 LAB — FERRITIN: Ferritin: 23.1 ng/mL (ref 10.0–291.0)

## 2023-08-03 LAB — VITAMIN B12: Vitamin B-12: 1179 pg/mL — ABNORMAL HIGH (ref 211–911)

## 2023-08-03 LAB — TSH: TSH: 1.04 u[IU]/mL (ref 0.35–5.50)

## 2023-08-03 LAB — IRON: Iron: 124 ug/dL (ref 42–145)

## 2023-08-03 LAB — VITAMIN D 25 HYDROXY (VIT D DEFICIENCY, FRACTURES): VITD: 50.96 ng/mL (ref 30.00–100.00)

## 2023-08-03 NOTE — Addendum Note (Signed)
 Addended by: Gerry Krone on: 08/03/2023 01:00 PM   Modules accepted: Orders

## 2023-08-06 ENCOUNTER — Other Ambulatory Visit (INDEPENDENT_AMBULATORY_CARE_PROVIDER_SITE_OTHER)

## 2023-08-06 DIAGNOSIS — N182 Chronic kidney disease, stage 2 (mild): Secondary | ICD-10-CM | POA: Diagnosis not present

## 2023-08-06 DIAGNOSIS — E1122 Type 2 diabetes mellitus with diabetic chronic kidney disease: Secondary | ICD-10-CM

## 2023-08-06 LAB — MICROALBUMIN / CREATININE URINE RATIO
Creatinine,U: 123.7 mg/dL
Microalb Creat Ratio: 33.9 mg/g — ABNORMAL HIGH (ref 0.0–30.0)
Microalb, Ur: 4.2 mg/dL — ABNORMAL HIGH (ref 0.0–1.9)

## 2023-08-10 ENCOUNTER — Encounter: Payer: Self-pay | Admitting: Family Medicine

## 2023-08-10 ENCOUNTER — Ambulatory Visit: Payer: Medicare Other | Admitting: Family Medicine

## 2023-08-10 VITALS — BP 138/85 | HR 84 | Temp 98.0°F | Ht <= 58 in | Wt 166.5 lb

## 2023-08-10 DIAGNOSIS — N182 Chronic kidney disease, stage 2 (mild): Secondary | ICD-10-CM

## 2023-08-10 DIAGNOSIS — E1169 Type 2 diabetes mellitus with other specified complication: Secondary | ICD-10-CM

## 2023-08-10 DIAGNOSIS — E1122 Type 2 diabetes mellitus with diabetic chronic kidney disease: Secondary | ICD-10-CM | POA: Diagnosis not present

## 2023-08-10 DIAGNOSIS — E66811 Obesity, class 1: Secondary | ICD-10-CM

## 2023-08-10 DIAGNOSIS — I10 Essential (primary) hypertension: Secondary | ICD-10-CM | POA: Diagnosis not present

## 2023-08-10 DIAGNOSIS — R809 Proteinuria, unspecified: Secondary | ICD-10-CM | POA: Diagnosis not present

## 2023-08-10 DIAGNOSIS — E6609 Other obesity due to excess calories: Secondary | ICD-10-CM

## 2023-08-10 DIAGNOSIS — Z79899 Other long term (current) drug therapy: Secondary | ICD-10-CM

## 2023-08-10 DIAGNOSIS — Z0000 Encounter for general adult medical examination without abnormal findings: Secondary | ICD-10-CM | POA: Diagnosis not present

## 2023-08-10 DIAGNOSIS — K219 Gastro-esophageal reflux disease without esophagitis: Secondary | ICD-10-CM

## 2023-08-10 DIAGNOSIS — E785 Hyperlipidemia, unspecified: Secondary | ICD-10-CM

## 2023-08-10 DIAGNOSIS — Z6834 Body mass index (BMI) 34.0-34.9, adult: Secondary | ICD-10-CM

## 2023-08-10 MED ORDER — SIMVASTATIN 40 MG PO TABS: 40.0000 mg | ORAL_TABLET | Freq: Every day | ORAL | 3 refills | Status: AC

## 2023-08-10 MED ORDER — LOSARTAN POTASSIUM 100 MG PO TABS: 100.0000 mg | ORAL_TABLET | Freq: Every day | ORAL | 3 refills | Status: AC

## 2023-08-10 MED ORDER — OMEPRAZOLE 20 MG PO CPDR: 20.0000 mg | DELAYED_RELEASE_CAPSULE | Freq: Every day | ORAL | 3 refills | Status: AC

## 2023-08-10 MED ORDER — METFORMIN HCL 500 MG PO TABS: 500.0000 mg | ORAL_TABLET | Freq: Every day | ORAL | 3 refills | Status: AC

## 2023-08-10 MED ORDER — FUROSEMIDE 40 MG PO TABS: 40.0000 mg | ORAL_TABLET | Freq: Every day | ORAL | 3 refills | Status: AC

## 2023-08-10 NOTE — Patient Instructions (Addendum)
 If you are interested in the new shingles vaccine (Shingrix) - call your local pharmacy to check on coverage and availability   Check into a covid vaccine also   Add some strength training to your routine, this is important for bone and brain health and can reduce your risk of falls and help your body use insulin  properly and regulate weight  Light weights, exercise bands , and internet videos are a good way to start  Yoga (chair or regular), machines , floor exercises or a gym with machines are also good options   Start with some 2 lb weight / chair routine on line or DVD - lots of program   I worry that you will loose muscle with this medication   B12 level is high  Take your B12 every other day   For blood pressure and kidney health go up on losartan  from 50 to 100 mg daily   Follow up in 3 months

## 2023-08-10 NOTE — Progress Notes (Signed)
 Subjective:    Patient ID: Gabriella Howe, female    DOB: 06/09/1934, 89 y.o.   MRN: [id number]  HPI  Here for health maintenance exam and to review chronic medical problems   Wt Readings from Last 3 Encounters:  08/10/23 166 lb 8 oz (75.5 kg)  07/30/23 160 lb (72.6 kg)  04/12/23 183 lb 2 oz (83.1 kg)   34.80 kg/m  Vitals:   08/10/23 1003 08/10/23 1029  BP: (!) 142/90 138/85  Pulse: 84   Temp: 98 F (36.7 C)   SpO2: 97%     Immunization History  Administered Date(s) Administered   Fluad Quad(high Dose 65+) 01/20/2020   Fluad Trivalent(High Dose 65+) 12/11/2022   Influenza Split 01/24/2011   Influenza Whole 05/10/2009   Influenza,inj,Quad PF,6+ Mos 11/28/2013, 04/14/2015, 04/20/2016, 01/26/2017, 05/06/2018   Influenza-Unspecified 04/27/2022   Moderna Covid-19 Fall Seasonal Vaccine 35yrs & older 04/27/2022   PFIZER(Purple Top)SARS-COV-2 Vaccination 05/02/2019, 05/23/2019, 03/16/2020   Pneumococcal Conjugate-13 11/28/2013   Pneumococcal Polysaccharide-23 03/28/2003    Health Maintenance Due  Topic Date Due   FOOT EXAM  05/12/2022   Tired-but no change from usual  Still works and this keeps her going     Shingrix vaccine -wants to get at pharmacy  Wants to get covid shot also   Reaction to tetanus shot in the past   Mammogram-no longer gets/ declines  Self breast exam- no lumps  Declines breast exam today   Gyn health No problems    Colon cancer screening -out aged  Has chronic constipation   Bone health  Dexa 2001, declines another  Falls- none  Fractures-none  Supplements -vitamin D   Last vitamin D  Lab Results  Component Value Date   VD25OH 50.96 08/03/2023    Exercise  Moving slower than she used to  [facility] with cane  Does climb steps    Mood    08/10/2023   10:08 AM 07/30/2023   11:09 AM 04/12/2023   11:09 AM 12/11/2022    2:01 PM 07/26/2022   11:13 AM  Depression screen PHQ 2/9  Decreased Interest 1 0 0 1 0  Down, Depressed,  Hopeless 1 0 0 1 0  PHQ - 2 Score 2 0 0 2 0  Altered sleeping 0 0 0 1   Tired, decreased energy 3 2 1 1    Change in appetite 2 0 0 0   Feeling bad or failure about yourself  0 0 0 0   Trouble concentrating 0 0 0 0   Moving slowly or fidgety/restless 0 0 0 0   Suicidal thoughts 0 0 0 0   PHQ-9 Score 7 2 1 4    Difficult doing work/chores Not difficult at all Not difficult at all Not difficult at all Somewhat difficult    HTN bp is stable today  No cp or palpitations or headaches or edema  No side effects to medicines  BP Readings from Last 3 Encounters:  08/10/23 138/85  07/30/23 130/84  04/12/23 130/84    Losartan  50 mg daily  Furosemide  40 mg daily for diastolic dysfunction  CKD2 Lab Results  Component Value Date   NA 143 08/03/2023   K 3.9 08/03/2023   CO2 31 08/03/2023   GLUCOSE 123 (H) 08/03/2023   BUN 18 08/03/2023   CREATININE 0.91 08/03/2023   CALCIUM 10.1 08/03/2023   GFR 57.04 (L) 08/03/2023   GFRNONAA >60 10/29/2020   GERD Omeprazole  20 mg daily   Lab Results  Component Value Date  VITAMINB12 1,179 (H) 08/03/2023    DM2 Lab Results  Component Value Date   HGBA1C 6.4 08/03/2023   HGBA1C 5.7 (A) 04/12/2023   HGBA1C 7.0 (A) 11/08/2022    Controlled  Metformin  500 mg daily  Glipizide  xl 5 mg daily in past-stopped this  Semaglutide  1 mg weekly (cannot go up on this due to constipation)   Lab Results  Component Value Date   LABMICR See below: 02/26/2020   LABMICR See below: 02/17/2020   MICROALBUR 4.2 (H) 08/06/2023   MICROALBUR 3.4 (H) 08/08/2022   5/12 - microalb radio 33.9    Not much appetite  Tries to get protein every day  Some pre prepared meals and also bean     Hyperlipidemia Lab Results  Component Value Date   CHOL 119 08/03/2023   CHOL 120 08/03/2022   CHOL 113 10/19/2021   Lab Results  Component Value Date   HDL 51.50 08/03/2023   HDL 49.70 08/03/2022   HDL 46.90 10/19/2021   Lab Results  Component Value Date    LDLCALC 49 08/03/2023   LDLCALC 45 08/03/2022   LDLCALC 30 10/19/2021   Lab Results  Component Value Date   TRIG 93.0 08/03/2023   TRIG 126.0 08/03/2022   TRIG 180.0 (H) 10/19/2021   Lab Results  Component Value Date   CHOLHDL 2 08/03/2023   CHOLHDL 2 08/03/2022   CHOLHDL 2 10/19/2021   No results found for: "LDLDIRECT" Simvastatin  40 mg daily   Lab Results  Component Value Date   TSH 1.04 08/03/2023   Lab Results  Component Value Date   WBC 7.2 08/03/2023   HGB 15.0 08/03/2023   HCT 44.8 08/03/2023   MCV 92.8 08/03/2023   PLT 202.0 08/03/2023   Lab Results  Component Value Date   ALT 11 08/03/2023   AST 13 08/03/2023   ALKPHOS 65 08/03/2023   BILITOT 1.3 (H) 08/03/2023       History of OSA  Patient Active Problem List   Diagnosis Date Noted   Microalbuminuria 08/10/2023   Constipation 04/12/2023   Head pain 02/13/2022   Neck pain 02/13/2022   Current use of proton pump inhibitor 10/19/2021   Kidney stones 11/02/2020   Asthma 10/23/2020   CKD (chronic kidney disease), stage II 10/23/2020   Pedal edema 08/05/2019   DJD (degenerative joint disease) 02/26/2019   Iron  deficiency anemia 04/30/2017   History of shingles 10/03/2016   Routine general medical examination at a health care facility 04/14/2015   Incomplete emptying of bladder 11/28/2012   Encounter for Medicare annual wellness exam 11/19/2012   History of kidney stones 11/19/2012   Fatigue 06/07/2011   DIASTOLIC HEART FAILURE, CHRONIC 07/29/2009   Hyperlipidemia associated with type 2 diabetes mellitus (HCC) 09/13/2006   Class 1 obesity due to excess calories with serious comorbidity and body mass index (BMI) of 34.0 to 34.9 in adult 09/10/2006   CARPAL TUNNEL SYNDROME 09/10/2006   Essential hypertension 09/10/2006   DIASTOLIC DYSFUNCTION 09/10/2006   GERD 09/10/2006   ROSACEA 09/10/2006   Degeneration of lumbar or lumbosacral intervertebral disc 09/10/2006   OSA (obstructive sleep apnea)  09/10/2006   History of colonic polyps 09/10/2006   Type II diabetes mellitus with renal manifestations (HCC) 09/06/2006   Past Medical History:  Diagnosis Date   Adenomatous colon polyp 2003   Benign neoplasm of colon    Carpal tunnel syndrome    much better   Chest pain, unspecified    CHF (congestive heart failure) (  HCC)    diastolic   Complication of anesthesia    careful in positioning due to back pain   Degeneration of intervertebral disc, site unspecified    Diabetes mellitus    type II   Encounter for long-term (current) use of other medications    Fluid overload    GERD (gastroesophageal reflux disease)    Heart disease, unspecified    History of kidney stones    with stent/hospitalized   Hyperlipidemia    Hypertension    Obesity, unspecified    Rosacea    Shortness of breath    Unspecified asthma(493.90)    Unspecified sleep apnea    no cpap/ sleeps sitting up   Past Surgical History:  Procedure Laterality Date   ABDOMINAL HYSTERECTOMY     partial, fibroids   BOTOX  INJECTION N/A 02/09/2020   Procedure: BOTOX  INJECTION;  Surgeon: Dustin Gimenez, MD;  Location: [facility];  Service: Urology;  Laterality: N/A;   CYSTOSCOPY WITH STENT PLACEMENT Left 02/18/2019   Procedure: CYSTOSCOPY WITH STENT PLACEMENT;  Surgeon: Geraline Knapp, MD;  Location: [facility];  Service: Urology;  Laterality: Left;   CYSTOSCOPY/URETEROSCOPY/HOLMIUM LASER/STENT PLACEMENT Left 04/01/2019   Procedure: CYSTOSCOPY/URETEROSCOPY/HOLMIUM LASER/STENT PLACEMENT;  Surgeon: Geraline Knapp, MD;  Location: [facility];  Service: Urology;  Laterality: Left;   ESOPHAGOGASTRODUODENOSCOPY  2000   TONSILLECTOMY     UMBILICAL HERNIA REPAIR  01/2004   URETERAL STENT PLACEMENT  2/13   for L sided stone/ ecoli sepsis   Social History   Tobacco Use   Smoking status: Never    Passive exposure: Past   Smokeless tobacco: Never  Vaping Use   Vaping status: Never Used  Substance Use Topics   Alcohol use:  No    Alcohol/week: 0.0 standard drinks of alcohol   Drug use: No   Family History  Problem Relation Age of Onset   Early death Father 21       train accident   Diabetes Mother    Heart disease Mother    Diabetes Sister    Diabetes Brother    Colon cancer Brother    Cancer Paternal Grandmother        unknown   Diabetes Sister    Allergies  Allergen Reactions   Rofecoxib     REACTION: reaction not known   Antihistamines, Chlorpheniramine-Type Palpitations   Sulfa Antibiotics Rash   Tetanus-Diphtheria Toxoids Td Other (See Comments)    REACTION: sick with fever   Current Outpatient Medications on File Prior to Visit  Medication Sig Dispense Refill   Accu-Chek Softclix Lancets lancets USE AS DIRECTED TO CHECK BLOOD SUGAR ONCE DAILY AND AS NEEDED. 100 each 3   Aspirin -Caffeine  (BC FAST PAIN RELIEF PO) Take 1 packet by mouth daily as needed (headache).     Cholecalciferol (VITAMIN D3) 50 MCG (2000 UT) capsule Take 1 capsule (2,000 Units total) by mouth daily. 90 capsule 3   Cyanocobalamin  (B-12 PO) Take by mouth.     glucose blood (ACCU-CHEK AVIVA PLUS) test strip USE AS DIRECTED TO CHECK BLOOD SUGAR ONCE DAILY AND AS NEEDED. 90 each 3   Pseudoephedrine HCl (SINUS & ALLERGY 12 HOUR PO) Take 1 tablet by mouth daily.      Semaglutide , 1 MG/DOSE, (OZEMPIC , 1 MG/DOSE,) 4 MG/3ML SOPN INJECT ONE MG UNDER THE SKIN ONCE A WEEK. 3 mL 3   triamcinolone  cream (KENALOG ) 0.1 % APPLY TO AFFECTED AREA(S)  TOPICALLY TWO TIMES A DAY 30 g 1   No  current facility-administered medications on file prior to visit.    Review of Systems  Constitutional:  Positive for fatigue. Negative for activity change, appetite change, fever and unexpected weight change.  HENT:  Negative for congestion, ear pain, rhinorrhea, sinus pressure and sore throat.   Eyes:  Negative for pain, redness and visual disturbance.  Respiratory:  Negative for cough, shortness of breath and wheezing.   Cardiovascular:  Negative for  chest pain and palpitations.  Gastrointestinal:  Negative for abdominal pain, blood in stool, constipation and diarrhea.  Endocrine: Negative for polydipsia and polyuria.  Genitourinary:  Negative for dysuria, frequency and urgency.  Musculoskeletal:  Positive for arthralgias. Negative for back pain and myalgias.  Skin:  Negative for pallor and rash.  Allergic/Immunologic: Negative for environmental allergies.  Neurological:  Negative for dizziness, syncope and headaches.  Hematological:  Negative for adenopathy. Does not bruise/bleed easily.  Psychiatric/Behavioral:  Negative for decreased concentration and dysphoric mood. The patient is not nervous/anxious.        Objective:   Physical Exam Constitutional:      General: She is not in acute distress.    Appearance: Normal appearance. She is well-developed. She is obese. She is not ill-appearing or diaphoretic.  HENT:     Head: Normocephalic and atraumatic.     Right Ear: Tympanic membrane, ear canal and external ear normal.     Left Ear: Tympanic membrane, ear canal and external ear normal.     Nose: Nose normal. No congestion.     Mouth/Throat:     Mouth: Mucous membranes are moist.     Pharynx: Oropharynx is clear. No posterior oropharyngeal erythema.  Eyes:     General: No scleral icterus.    Extraocular Movements: Extraocular movements intact.     Conjunctiva/sclera: Conjunctivae normal.     Pupils: Pupils are equal, round, and reactive to light.  Neck:     Thyroid : No thyromegaly.     Vascular: No carotid bruit or JVD.  Cardiovascular:     Rate and Rhythm: Normal rate and regular rhythm.     Pulses: Normal pulses.     Heart sounds: Normal heart sounds.     No gallop.  Pulmonary:     Effort: Pulmonary effort is normal. No respiratory distress.     Breath sounds: Normal breath sounds. No wheezing.     Comments: Good air exch Chest:     Chest wall: No tenderness.  Abdominal:     General: Bowel sounds are normal. There  is no distension or abdominal bruit.     Palpations: Abdomen is soft. There is no mass.     Tenderness: There is no abdominal tenderness.     Hernia: No hernia is present.  Genitourinary:    Comments: Declines breast exam  Musculoskeletal:        General: No tenderness. Normal range of motion.     Cervical back: Normal range of motion and neck supple. No rigidity. No muscular tenderness.     Right lower leg: No edema.     Left lower leg: No edema.     Comments: No kyphosis   Lymphadenopathy:     Cervical: No cervical adenopathy.  Skin:    General: Skin is warm and dry.     Coloration: Skin is not pale.     Findings: No erythema or rash.     Comments: Solar lentigines diffusely   Neurological:     Mental Status: She is alert. Mental status is at  baseline.     Cranial Nerves: No cranial nerve deficit.     Motor: No abnormal muscle tone.     Coordination: Coordination normal.     Gait: Gait normal.     Deep Tendon Reflexes: Reflexes are normal and symmetric. Reflexes normal.  Psychiatric:        Mood and Affect: Mood normal.        Cognition and Memory: Cognition and memory normal.           Assessment & Plan:   Problem List Items Addressed This Visit       Cardiovascular and Mediastinum   Essential hypertension   bp is stable today  No cp or palpitations or headaches or edema  No side effects to medicines  BP Readings from Last 3 Encounters:  08/10/23 138/85  07/30/23 130/84  04/12/23 130/84    For microalbuminuria will increase losartan  to 100 mg daily  Furosemide  40 mg daily (aldo for DD) Follow up planned         Relevant Medications   losartan  (COZAAR ) 100 MG tablet   furosemide  (LASIX ) 40 MG tablet   simvastatin  (ZOCOR ) 40 MG tablet     Digestive   GERD   Continues omeprazole  20  Has not been able to tolerate getting off of it  Watching vitamin levels Is working on weight loss/ this should help Encouraged to avoid triggers       Relevant  Medications   omeprazole  (PRILOSEC) 20 MG capsule     Endocrine   Type II diabetes mellitus with renal manifestations (HCC)   Lab Results  Component Value Date   HGBA1C 6.4 08/03/2023   HGBA1C 5.7 (A) 04/12/2023   HGBA1C 7.0 (A) 11/08/2022   Microalb ratio up to 33.9 , will increase losartan  to 100 and follow up 3 mo  Metformin  500 mg daily (watching renal numbers) On statin and arb  Reviewed low glycemic diet/need for protein       Relevant Medications   losartan  (COZAAR ) 100 MG tablet   metFORMIN  (GLUCOPHAGE ) 500 MG tablet   simvastatin  (ZOCOR ) 40 MG tablet   Hyperlipidemia associated with type 2 diabetes mellitus (HCC)   Disc goals for lipids and reasons to control them Rev last labs with pt Rev low sat fat diet in detail  LDL of 49  Continues simvastatin  40 mg daily       Relevant Medications   losartan  (COZAAR ) 100 MG tablet   furosemide  (LASIX ) 40 MG tablet   metFORMIN  (GLUCOPHAGE ) 500 MG tablet   simvastatin  (ZOCOR ) 40 MG tablet     Genitourinary   CKD (chronic kidney disease), stage II   GFR 57.04 DM and HTN  Microalb ratio is elevated   Encouraged good water intake Avoid nsaids as much as possible Increased losartan  from 50 to 100 mg daily        Other   Routine general medical examination at a health care facility - Primary   Reviewed health habits including diet and exercise and skin cancer prevention Reviewed appropriate screening tests for age  Also reviewed health mt list, fam hx and immunization status , as well as social and family history   See HPI Labs reviewed and ordered Health Maintenance  Topic Date Due   Complete foot exam   05/12/2022   COVID-19 Vaccine (5 - 2024-25 season) 08/25/2024*   Zoster (Shingles) Vaccine (1 of 2) 11/09/2024*   Flu Shot  10/26/2023   Hemoglobin A1C  02/03/2024  Eye exam for diabetics  07/22/2024   Medicare Annual Wellness Visit  07/29/2024   Pneumonia Vaccine  Completed   DEXA scan (bone density  measurement)  Completed   HPV Vaccine  Aged Out   Meningitis B Vaccine  Aged Out   DTaP/Tdap/Td vaccine  Discontinued  *Topic was postponed. The date shown is not the original due date.    Plans to get shingrix vaccine at pharmacy Cannot take tetanus shot  Declines breast cancer screen/mammo Declines dexa  Discussed fall prevention, supplements and exercise for bone density  PHQ 7 - mostly due to fatigue/ declines treatment       Microalbuminuria   Ratio of 33.9   Good DM control   Will increase losartan  to 100 mg daily  Follow up planned       Current use of proton pump inhibitor   Lab Results  Component Value Date   VITAMINB12 1,179 (H) 08/03/2023   Last vitamin D  Lab Results  Component Value Date   VD25OH 50.96 08/03/2023   Watching renal status      Class 1 obesity due to excess calories with serious comorbidity and body mass index (BMI) of 34.0 to 34.9 in adult   Discussed how this problem influences overall health and the risks it imposes  Reviewed plan for weight loss with lower calorie diet (via better food choices (lower glycemic and portion control) along with exercise building up to or more than 30 minutes 5 days per week including some aerobic activity and strength training   Has lost weight with GLP-1  semaglutide  1 mg weekly  Encouraged strongly to work on [employer], broadcasting/film/video       Relevant Medications   metFORMIN  (GLUCOPHAGE ) 500 MG tablet

## 2023-08-12 NOTE — Assessment & Plan Note (Signed)
 Lab Results  Component Value Date   HGBA1C 6.4 08/03/2023   HGBA1C 5.7 (A) 04/12/2023   HGBA1C 7.0 (A) 11/08/2022   Microalb ratio up to 33.9 , will increase losartan  to 100 and follow up 3 mo  Metformin  500 mg daily (watching renal numbers) On statin and arb  Reviewed low glycemic diet/need for protein

## 2023-08-12 NOTE — Assessment & Plan Note (Addendum)
 bp is stable today  No cp or palpitations or headaches or edema  No side effects to medicines  BP Readings from Last 3 Encounters:  08/10/23 138/85  07/30/23 130/84  04/12/23 130/84    For microalbuminuria will increase losartan  to 100 mg daily  Furosemide  40 mg daily (aldo for DD) Follow up planned

## 2023-08-12 NOTE — Assessment & Plan Note (Signed)
 Ratio of 33.9   Good DM control   Will increase losartan  to 100 mg daily  Follow up planned

## 2023-08-12 NOTE — Assessment & Plan Note (Signed)
 Reviewed health habits including diet and exercise and skin cancer prevention Reviewed appropriate screening tests for age  Also reviewed health mt list, fam hx and immunization status , as well as social and family history   See HPI Labs reviewed and ordered Health Maintenance  Topic Date Due   Complete foot exam   05/12/2022   COVID-19 Vaccine (5 - 2024-25 season) 08/25/2024*   Zoster (Shingles) Vaccine (1 of 2) 11/09/2024*   Flu Shot  10/26/2023   Hemoglobin A1C  02/03/2024   Eye exam for diabetics  07/22/2024   Medicare Annual Wellness Visit  07/29/2024   Pneumonia Vaccine  Completed   DEXA scan (bone density measurement)  Completed   HPV Vaccine  Aged Out   Meningitis B Vaccine  Aged Out   DTaP/Tdap/Td vaccine  Discontinued  *Topic was postponed. The date shown is not the original due date.    Plans to get shingrix vaccine at pharmacy Cannot take tetanus shot  Declines breast cancer screen/mammo Declines dexa  Discussed fall prevention, supplements and exercise for bone density  PHQ 7 - mostly due to fatigue/ declines treatment

## 2023-08-12 NOTE — Assessment & Plan Note (Signed)
 Discussed how this problem influences overall health and the risks it imposes  Reviewed plan for weight loss with lower calorie diet (via better food choices (lower glycemic and portion control) along with exercise building up to or more than 30 minutes 5 days per week including some aerobic activity and strength training   Has lost weight with GLP-1  semaglutide  1 mg weekly  Encouraged strongly to work on [employer], broadcasting/film/video

## 2023-08-12 NOTE — Assessment & Plan Note (Signed)
 Disc goals for lipids and reasons to control them Rev last labs with pt Rev low sat fat diet in detail  LDL of 49  Continues simvastatin  40 mg daily

## 2023-08-12 NOTE — Assessment & Plan Note (Signed)
 Lab Results  Component Value Date   VITAMINB12 1,179 (H) 08/03/2023   Last vitamin D  Lab Results  Component Value Date   VD25OH 50.96 08/03/2023   Watching renal status

## 2023-08-12 NOTE — Assessment & Plan Note (Signed)
 Continues omeprazole  20  Has not been able to tolerate getting off of it  Watching vitamin levels Is working on weight loss/ this should help Encouraged to avoid triggers

## 2023-08-12 NOTE — Assessment & Plan Note (Signed)
 GFR 57.04 DM and HTN  Microalb ratio is elevated   Encouraged good water intake Avoid nsaids as much as possible Increased losartan  from 50 to 100 mg daily

## 2023-09-10 ENCOUNTER — Ambulatory Visit: Admitting: Dermatology

## 2023-10-22 ENCOUNTER — Ambulatory Visit: Payer: Self-pay | Admitting: Family Medicine

## 2023-10-22 ENCOUNTER — Ambulatory Visit (INDEPENDENT_AMBULATORY_CARE_PROVIDER_SITE_OTHER): Admitting: Internal Medicine

## 2023-10-22 ENCOUNTER — Encounter: Payer: Self-pay | Admitting: Internal Medicine

## 2023-10-22 VITALS — BP 130/80 | HR 86 | Temp 97.9°F | Ht <= 58 in | Wt 163.0 lb

## 2023-10-22 DIAGNOSIS — R5383 Other fatigue: Secondary | ICD-10-CM

## 2023-10-22 NOTE — Telephone Encounter (Signed)
 FYI Only or Action Required?: FYI only for provider.  Patient was last seen in primary care on 08/10/2023 by Randeen Laine LABOR, MD.  Called Nurse Triage reporting Fatigue.  Symptoms began several weeks ago.  Triage Disposition: See HCP Within 4 Hours (Or PCP Triage)  Patient/caregiver understands and will follow disposition?: Yes                       Copied from CRM [id number]. Topic: Clinical - Red Word Triage >> Oct 22, 2023 10:38 AM Gabriella Howe wrote: Red Word that prompted transfer to Nurse Triage: Extreme Fatigue: Patient feeling very weak. very fatigued, and unable to eat. Said she is feeling horrible, she said it has been going for about 2 weeks and she said it has been getting worse by the day. Reason for Disposition  [1] MODERATE weakness (e.g., interferes with work, school, normal activities) AND [2] cause unknown  (Exceptions: Weakness from acute minor illness or poor fluid intake; weakness is chronic and not worse.)  Answer Assessment - Initial Assessment Questions DESCRIPTION: Describe how you are feeling.     Very weak, absolutely no energy, lack of appetite SEVERITY: How bad is it?  Can you stand and walk?     Yes ONSET: When did these symptoms begin? (e.g., hours, days, weeks, months)     3 weeks, seems like it gets worse every day CAUSE: What do you think is causing the weakness or fatigue? (e.g., not drinking enough fluids, medical problem, trouble sleeping)     Trying to stay hydrated; confirms urination at least once every 12 hours; no difficulty sleeping  OTHER SYMPTOMS: Do you have any other symptoms? (e.g., chest pain, fever, cough, SOB, vomiting, diarrhea, bleeding, other areas of pain)     Denies SOB, chest pain; feels like she has congestion in lungs at times  Pt is on Ozempic  (been on it since August 2024) and states she has last about 50 lbs. Pt is wondering if the dosage needs to be changed.  Protocols used: Weakness  (Generalized) and Fatigue-A-AH

## 2023-10-22 NOTE — Telephone Encounter (Signed)
 Aware, will watch for correspondence Thanks for seeing her

## 2023-10-22 NOTE — Progress Notes (Signed)
 Subjective:    Patient ID: Gabriella Howe, female    DOB: 10-29-1934, 89 y.o.   MRN: [id number]  HPI Here due to fatigue  Has felt really bad for 2.5 weeks now Always feels tired---but worse recently Some better today Feels shaky Can't eat--has lost 45# on ozempic  (taking for a year) AM nausea and constipation on this  Now really worse---like I can't raise my hands off the desk No fever Lots of mucus--AM nasal congestion---improves with allergy med Does have some sneezing spells  Current Outpatient Medications on File Prior to Visit  Medication Sig Dispense Refill   Accu-Chek Softclix Lancets lancets USE AS DIRECTED TO CHECK BLOOD SUGAR ONCE DAILY AND AS NEEDED. 100 each 3   Aspirin -Caffeine  (BC FAST PAIN RELIEF PO) Take 1 packet by mouth daily as needed (headache).     Cholecalciferol (VITAMIN D3) 50 MCG (2000 UT) capsule Take 1 capsule (2,000 Units total) by mouth daily. 90 capsule 3   Cyanocobalamin  (B-12 PO) Take by mouth.     Docusate Calcium (STOOL SOFTENER PO) Take by mouth.     furosemide  (LASIX ) 40 MG tablet Take 1 tablet (40 mg total) by mouth daily. 90 tablet 3   glucose blood (ACCU-CHEK AVIVA PLUS) test strip USE AS DIRECTED TO CHECK BLOOD SUGAR ONCE DAILY AND AS NEEDED. 90 each 3   losartan  (COZAAR ) 100 MG tablet Take 1 tablet (100 mg total) by mouth daily. 90 tablet 3   metFORMIN  (GLUCOPHAGE ) 500 MG tablet Take 1 tablet (500 mg total) by mouth daily with breakfast. 90 tablet 3   omeprazole  (PRILOSEC) 20 MG capsule Take 1 capsule (20 mg total) by mouth daily. 90 capsule 3   Pseudoephedrine HCl (SINUS & ALLERGY 12 HOUR PO) Take 1 tablet by mouth daily.      Semaglutide , 1 MG/DOSE, (OZEMPIC , 1 MG/DOSE,) 4 MG/3ML SOPN INJECT ONE MG UNDER THE SKIN ONCE A WEEK. 3 mL 3   simvastatin  (ZOCOR ) 40 MG tablet Take 1 tablet (40 mg total) by mouth at bedtime. 90 tablet 3   triamcinolone  cream (KENALOG ) 0.1 % APPLY TO AFFECTED AREA(S)  TOPICALLY TWO TIMES A DAY 30 g 1   No  current facility-administered medications on file prior to visit.    Allergies  Allergen Reactions   Rofecoxib     REACTION: reaction not known   Antihistamines, Chlorpheniramine-Type Palpitations   Sulfa Antibiotics Rash   Tetanus-Diphtheria Toxoids Td Other (See Comments)    REACTION: sick with fever    Past Medical History:  Diagnosis Date   Adenomatous colon polyp 2003   Benign neoplasm of colon    Carpal tunnel syndrome    much better   Chest pain, unspecified    CHF (congestive heart failure) (HCC)    diastolic   Complication of anesthesia    careful in positioning due to back pain   Degeneration of intervertebral disc, site unspecified    Diabetes mellitus    type II   Encounter for long-term (current) use of other medications    Fluid overload    GERD (gastroesophageal reflux disease)    Heart disease, unspecified    History of kidney stones    with stent/hospitalized   Hyperlipidemia    Hypertension    Obesity, unspecified    Rosacea    Shortness of breath    Unspecified asthma(493.90)    Unspecified sleep apnea    no cpap/ sleeps sitting up    Past Surgical History:  Procedure Laterality Date  ABDOMINAL HYSTERECTOMY     partial, fibroids   BOTOX  INJECTION N/A 02/09/2020   Procedure: BOTOX  INJECTION;  Surgeon: Penne Knee, MD;  Location: [facility];  Service: Urology;  Laterality: N/A;   CYSTOSCOPY WITH STENT PLACEMENT Left 02/18/2019   Procedure: CYSTOSCOPY WITH STENT PLACEMENT;  Surgeon: Twylla Glendia BROCKS, MD;  Location: [facility];  Service: Urology;  Laterality: Left;   CYSTOSCOPY/URETEROSCOPY/HOLMIUM LASER/STENT PLACEMENT Left 04/01/2019   Procedure: CYSTOSCOPY/URETEROSCOPY/HOLMIUM LASER/STENT PLACEMENT;  Surgeon: Twylla Glendia BROCKS, MD;  Location: [facility];  Service: Urology;  Laterality: Left;   ESOPHAGOGASTRODUODENOSCOPY  2000   TONSILLECTOMY     UMBILICAL HERNIA REPAIR  01/2004   URETERAL STENT PLACEMENT  2/13   for L sided stone/ ecoli sepsis     Family History  Problem Relation Age of Onset   Early death Father 74       train accident   Diabetes Mother    Heart disease Mother    Diabetes Sister    Diabetes Brother    Colon cancer Brother    Cancer Paternal Grandmother        unknown   Diabetes Sister     Social History   Socioeconomic History   Marital status: Widowed    Spouse name: Not on file   Number of children: Not on file   Years of education: Not on file   Highest education level: Not on file  Occupational History   Not on file  Tobacco Use   Smoking status: Never    Passive exposure: Past   Smokeless tobacco: Never  Vaping Use   Vaping status: Never Used  Substance and Sexual Activity   Alcohol use: No    Alcohol/week: 0.0 standard drinks of alcohol   Drug use: No   Sexual activity: Never  Other Topics Concern   Not on file  Social History Narrative   Not on file   Social Drivers of Health   Financial Resource Strain: Low Risk  (07/30/2023)   Overall Financial Resource Strain (CARDIA)    Difficulty of Paying Living Expenses: Not hard at all  Food Insecurity: No Food Insecurity (07/30/2023)   Hunger Vital Sign    Worried About Running Out of Food in the Last Year: Never true    Ran Out of Food in the Last Year: Never true  Transportation Needs: No Transportation Needs (07/30/2023)   PRAPARE - Administrator, Civil Service (Medical): No    Lack of Transportation (Non-Medical): No  Physical Activity: Insufficiently Active (07/30/2023)   Exercise Vital Sign    Days of Exercise per Week: 2 days    Minutes of Exercise per Session: 20 min  Stress: No Stress Concern Present (07/30/2023)   [employer] of Occupational Health - Occupational Stress Questionnaire    Feeling of Stress : Not at all  Social Connections: Moderately Isolated (07/30/2023)   Social Connection and Isolation Panel    Frequency of Communication with Friends and Family: More than three times a week    Frequency of  Social Gatherings with Friends and Family: More than three times a week    Attends Religious Services: More than 4 times per year    Active Member of [employer] or Organizations: No    Attends Banker Meetings: Never    Marital Status: Widowed  Intimate Partner Violence: Not At Risk (07/30/2023)   Humiliation, Afraid, Rape, and Kick questionnaire    Fear of Current or Ex-Partner: No    Emotionally  Abused: No    Physically Abused: No    Sexually Abused: No   Review of Systems No dysuria or hematuria No red or suspicious skin areas Some degree of sadness---but not daily and nothing new Air conditioning is fine Lives alone No new stressors---still works  Surveyor, quantity for [employer]) Sleeps okay --in chair (which is better for her arthritis). Arthritis is better with her weight loss    Objective:   Physical Exam Constitutional:      Appearance: Normal appearance.  Cardiovascular:     Rate and Rhythm: Normal rate and regular rhythm.     Heart sounds: No murmur heard.    No gallop.  Pulmonary:     Effort: Pulmonary effort is normal.     Breath sounds: Normal breath sounds. No wheezing or rales.  Abdominal:     Palpations: Abdomen is soft.     Tenderness: There is no abdominal tenderness.  Musculoskeletal:     Cervical back: Neck supple.     Right lower leg: No edema.     Left lower leg: No edema.  Lymphadenopathy:     Cervical: No cervical adenopathy.  Neurological:     Mental Status: She is alert.  Psychiatric:        Mood and Affect: Mood normal.        Behavior: Behavior normal.            Assessment & Plan:

## 2023-10-22 NOTE — Assessment & Plan Note (Signed)
 Vague No evidence of infection or acute illness No depression and sleeps okay Still works ?related to the heat? ?related to ozempic   Will check labs If no answers and doesn't improve, may want to decrease or hold the ozempic  for a while

## 2023-10-23 LAB — COMPREHENSIVE METABOLIC PANEL WITH GFR
ALT: 8 U/L (ref 0–35)
AST: 11 U/L (ref 0–37)
Albumin: 4.2 g/dL (ref 3.5–5.2)
Alkaline Phosphatase: 68 U/L (ref 39–117)
BUN: 14 mg/dL (ref 6–23)
CO2: 33 meq/L — ABNORMAL HIGH (ref 19–32)
Calcium: 9.9 mg/dL (ref 8.4–10.5)
Chloride: 100 meq/L (ref 96–112)
Creatinine, Ser: 1.04 mg/dL (ref 0.40–1.20)
GFR: 48.52 mL/min — ABNORMAL LOW (ref >60.00)
Glucose, Bld: 148 mg/dL — ABNORMAL HIGH (ref 70–99)
Potassium: 4.3 meq/L (ref 3.5–5.1)
Sodium: 145 meq/L (ref 135–145)
Total Bilirubin: 1.4 mg/dL — ABNORMAL HIGH (ref 0.2–1.2)
Total Protein: 6.6 g/dL (ref 6.0–8.3)

## 2023-10-23 LAB — CBC
HCT: 39.3 % (ref 36.0–46.0)
Hemoglobin: 13.6 g/dL (ref 12.0–15.0)
MCHC: 34.5 g/dL (ref 30.0–36.0)
MCV: 90.2 fl (ref 78.0–100.0)
Platelets: 272 K/uL (ref 150.0–400.0)
RBC: 4.36 Mil/uL (ref 3.87–5.11)
RDW: 13 % (ref 11.5–15.5)
WBC: 8.2 K/uL (ref 4.0–10.5)

## 2023-10-23 LAB — TSH: TSH: 0.92 u[IU]/mL (ref 0.35–5.50)

## 2023-10-24 ENCOUNTER — Ambulatory Visit: Payer: Self-pay | Admitting: Internal Medicine

## 2023-10-24 ENCOUNTER — Encounter: Payer: Self-pay | Admitting: Family Medicine

## 2023-10-24 MED ORDER — SEMAGLUTIDE(0.25 OR 0.5MG/DOS) 2 MG/1.5ML ~~LOC~~ SOPN
0.5000 mg | PEN_INJECTOR | SUBCUTANEOUS | 2 refills | Status: DC
Start: 2023-10-24 — End: 2024-01-23

## 2023-11-09 ENCOUNTER — Ambulatory Visit: Admitting: Family Medicine

## 2024-01-22 ENCOUNTER — Other Ambulatory Visit: Payer: Self-pay | Admitting: Family Medicine

## 2024-01-23 NOTE — Telephone Encounter (Signed)
 I sent it  Please schedule follow up

## 2024-01-23 NOTE — Telephone Encounter (Signed)
 Last filled on 04/26/23 #1.5 mL/ 2 refill  CPE was on 08/10/23, pt cancelled her f/u on 11/09/23 with PCP, no future appts

## 2024-01-29 ENCOUNTER — Encounter: Payer: Self-pay | Admitting: Family Medicine

## 2024-01-29 ENCOUNTER — Ambulatory Visit (INDEPENDENT_AMBULATORY_CARE_PROVIDER_SITE_OTHER): Admitting: Family Medicine

## 2024-01-29 VITALS — BP 135/75 | HR 70 | Temp 97.9°F | Ht <= 58 in | Wt 160.4 lb

## 2024-01-29 DIAGNOSIS — E1169 Type 2 diabetes mellitus with other specified complication: Secondary | ICD-10-CM | POA: Diagnosis not present

## 2024-01-29 DIAGNOSIS — E785 Hyperlipidemia, unspecified: Secondary | ICD-10-CM

## 2024-01-29 DIAGNOSIS — Z7985 Long-term (current) use of injectable non-insulin antidiabetic drugs: Secondary | ICD-10-CM

## 2024-01-29 DIAGNOSIS — Z23 Encounter for immunization: Secondary | ICD-10-CM

## 2024-01-29 DIAGNOSIS — R809 Proteinuria, unspecified: Secondary | ICD-10-CM | POA: Diagnosis not present

## 2024-01-29 DIAGNOSIS — Z7984 Long term (current) use of oral hypoglycemic drugs: Secondary | ICD-10-CM

## 2024-01-29 DIAGNOSIS — E1122 Type 2 diabetes mellitus with diabetic chronic kidney disease: Secondary | ICD-10-CM

## 2024-01-29 DIAGNOSIS — I10 Essential (primary) hypertension: Secondary | ICD-10-CM

## 2024-01-29 LAB — POCT GLYCOSYLATED HEMOGLOBIN (HGB A1C): Hemoglobin A1C: 5.9 % — AB (ref 4.0–5.6)

## 2024-01-29 NOTE — Patient Instructions (Addendum)
 Anything you can do to add muscle -do it Add some strength training to your routine, this is important for bone and brain health and can reduce your risk of falls and help your body use insulin  properly and regulate weight  Light weights, exercise bands , and internet videos are a good way to start  Yoga (chair or regular), machines , floor exercises or a gym with machines are also good options   You need extra to avoid muscle loss from the ozempic    You need protein with every meal and snack  (you need it in the am)  The following are examples of protein in diet  Meat  Fish  Eggs  Dairy products  Soy products  Oat milk  Almond milk Nuts and nut butters  Legumes  Dried beans   Continue current medicines  Glad you are feeling better   If you get low glucose readings let me know

## 2024-01-29 NOTE — Assessment & Plan Note (Signed)
 Disc goals for lipids and reasons to control them Rev last labs with pt Rev low sat fat diet in detail  LDL of 49  Continues simvastatin  40 mg daily

## 2024-01-29 NOTE — Progress Notes (Signed)
 Subjective:    Patient ID: Gabriella Howe, female    DOB: 07-Nov-1934, 89 y.o.   MRN: [id number]  HPI  Wt Readings from Last 3 Encounters:  01/29/24 160 lb 6 oz (72.7 kg)  10/22/23 163 lb (73.9 kg)  08/10/23 166 lb 8 oz (75.5 kg)   33.52 kg/m  Vitals:   01/29/24 0924 01/29/24 0952  BP: (!) 150/80 135/75  Pulse: 70   Temp: 97.9 F (36.6 C)   SpO2: 98%    Pt presents for follow up of  DM HTN Hyperlipidemia   HTN bp is stable today  No cp or palpitations or headaches or edema  No side effects to medicines  BP Readings from Last 3 Encounters:  01/29/24 135/75  10/22/23 130/80  08/10/23 138/85    Losartan  100 mg daily  Furosemide   40 mg daily  (also for diastolic HF)  Blood pressure home-130s systolic   Lab Results  Component Value Date   NA 145 10/22/2023   K 4.3 10/22/2023   CO2 33 (H) 10/22/2023   GLUCOSE 148 (H) 10/22/2023   BUN 14 10/22/2023   CREATININE 1.04 10/22/2023   CALCIUM 9.9 10/22/2023   GFR 48.52 (L) 10/22/2023   GFRNONAA >60 10/29/2020    DM2 Diabetes Home sugar results   DM diet  Eats fairly healthy  Getting produce   Exercise  Stairs  Some lifting at home   Metformin  500 mg daily Semaglutide  0.5 mg weekly  (feels better- was nauseated and tired on 1 mg)    Lab Results  Component Value Date   HGBA1C 5.9 (A) 01/29/2024   HGBA1C 6.4 08/03/2023   HGBA1C 5.7 (A) 04/12/2023   Lab Results  Component Value Date   LABMICR See below: 02/26/2020   LABMICR See below: 02/17/2020   MICROALBUR 4.2 (H) 08/06/2023   MICROALBUR 0.2 01/14/2008    Renal protection-arb  Last eye exam - 06/2023    Hyperlipidemia Lab Results  Component Value Date   CHOL 119 08/03/2023   HDL 51.50 08/03/2023   LDLCALC 49 08/03/2023   TRIG 93.0 08/03/2023   CHOLHDL 2 08/03/2023   Simvastatin  40 mg daily   Patient Active Problem List   Diagnosis Date Noted   Microalbuminuria 08/10/2023   Constipation 04/12/2023   Head pain 02/13/2022   Neck  pain 02/13/2022   Current use of proton pump inhibitor 10/19/2021   Kidney stones 11/02/2020   Asthma 10/23/2020   CKD (chronic kidney disease), stage II 10/23/2020   Pedal edema 08/05/2019   DJD (degenerative joint disease) 02/26/2019   Iron  deficiency anemia 04/30/2017   History of shingles 10/03/2016   Routine general medical examination at a health care facility 04/14/2015   Incomplete emptying of bladder 11/28/2012   Encounter for Medicare annual wellness exam 11/19/2012   History of kidney stones 11/19/2012   Fatigue 06/07/2011   DIASTOLIC HEART FAILURE, CHRONIC 07/29/2009   Hyperlipidemia associated with type 2 diabetes mellitus (HCC) 09/13/2006   Class 1 obesity due to excess calories with serious comorbidity and body mass index (BMI) of 34.0 to 34.9 in adult 09/10/2006   CARPAL TUNNEL SYNDROME 09/10/2006   Essential hypertension 09/10/2006   DIASTOLIC DYSFUNCTION 09/10/2006   GERD 09/10/2006   ROSACEA 09/10/2006   Degeneration of lumbar or lumbosacral intervertebral disc 09/10/2006   OSA (obstructive sleep apnea) 09/10/2006   History of colonic polyps 09/10/2006   Type II diabetes mellitus with renal manifestations (HCC) 09/06/2006   Past Medical History:  Diagnosis  Date   Adenomatous colon polyp 2003   Benign neoplasm of colon    Carpal tunnel syndrome    much better   Chest pain, unspecified    CHF (congestive heart failure) (HCC)    diastolic   Complication of anesthesia    careful in positioning due to back pain   Degeneration of intervertebral disc, site unspecified    Diabetes mellitus    type II   Encounter for long-term (current) use of other medications    Fluid overload    GERD (gastroesophageal reflux disease)    Heart disease, unspecified    History of kidney stones    with stent/hospitalized   Hyperlipidemia    Hypertension    Obesity, unspecified    Rosacea    Shortness of breath    Unspecified asthma(493.90)    Unspecified sleep apnea     no cpap/ sleeps sitting up   Past Surgical History:  Procedure Laterality Date   ABDOMINAL HYSTERECTOMY     partial, fibroids   BOTOX  INJECTION N/A 02/09/2020   Procedure: BOTOX  INJECTION;  Surgeon: Penne Knee, MD;  Location: [facility];  Service: Urology;  Laterality: N/A;   CYSTOSCOPY WITH STENT PLACEMENT Left 02/18/2019   Procedure: CYSTOSCOPY WITH STENT PLACEMENT;  Surgeon: Twylla Glendia BROCKS, MD;  Location: [facility];  Service: Urology;  Laterality: Left;   CYSTOSCOPY/URETEROSCOPY/HOLMIUM LASER/STENT PLACEMENT Left 04/01/2019   Procedure: CYSTOSCOPY/URETEROSCOPY/HOLMIUM LASER/STENT PLACEMENT;  Surgeon: Twylla Glendia BROCKS, MD;  Location: [facility];  Service: Urology;  Laterality: Left;   ESOPHAGOGASTRODUODENOSCOPY  2000   TONSILLECTOMY     UMBILICAL HERNIA REPAIR  01/2004   URETERAL STENT PLACEMENT  2/13   for L sided stone/ ecoli sepsis   Social History   Tobacco Use   Smoking status: Never    Passive exposure: Past   Smokeless tobacco: Never  Vaping Use   Vaping status: Never Used  Substance Use Topics   Alcohol use: No    Alcohol/week: 0.0 standard drinks of alcohol   Drug use: No   Family History  Problem Relation Age of Onset   Early death Father 31       train accident   Diabetes Mother    Heart disease Mother    Diabetes Sister    Diabetes Brother    Colon cancer Brother    Cancer Paternal Grandmother        unknown   Diabetes Sister    Allergies  Allergen Reactions   Rofecoxib     REACTION: reaction not known   Antihistamines, Chlorpheniramine-Type Palpitations   Sulfa Antibiotics Rash   Tetanus-Diphtheria Toxoids Td Other (See Comments)    REACTION: sick with fever   Current Outpatient Medications on File Prior to Visit  Medication Sig Dispense Refill   Accu-Chek Softclix Lancets lancets USE AS DIRECTED TO CHECK BLOOD SUGAR ONCE DAILY AND AS NEEDED. 100 each 3   Aspirin -Caffeine  (BC FAST PAIN RELIEF PO) Take 1 packet by mouth daily as needed (headache).      Cholecalciferol (VITAMIN D3) 50 MCG (2000 UT) capsule Take 1 capsule (2,000 Units total) by mouth daily. 90 capsule 3   Cyanocobalamin  (B-12 PO) Take by mouth.     Docusate Calcium (STOOL SOFTENER PO) Take by mouth.     furosemide  (LASIX ) 40 MG tablet Take 1 tablet (40 mg total) by mouth daily. 90 tablet 3   glucose blood (ACCU-CHEK AVIVA PLUS) test strip USE AS DIRECTED TO CHECK BLOOD SUGAR ONCE DAILY AND AS NEEDED.  90 each 3   losartan  (COZAAR ) 100 MG tablet Take 1 tablet (100 mg total) by mouth daily. 90 tablet 3   metFORMIN  (GLUCOPHAGE ) 500 MG tablet Take 1 tablet (500 mg total) by mouth daily with breakfast. 90 tablet 3   omeprazole  (PRILOSEC) 20 MG capsule Take 1 capsule (20 mg total) by mouth daily. 90 capsule 3   Pseudoephedrine HCl (SINUS & ALLERGY 12 HOUR PO) Take 1 tablet by mouth daily.      Semaglutide ,0.25 or 0.5MG /DOS, (OZEMPIC , 0.25 OR 0.5 MG/DOSE,) 2 MG/3ML SOPN INJECT 0.5 MG INTO THE SKIN ONCE A WEEK. 3 mL 0   simvastatin  (ZOCOR ) 40 MG tablet Take 1 tablet (40 mg total) by mouth at bedtime. 90 tablet 3   triamcinolone  cream (KENALOG ) 0.1 % APPLY TO AFFECTED AREA(S)  TOPICALLY TWO TIMES A DAY 30 g 1   No current facility-administered medications on file prior to visit.    Review of Systems  Constitutional:  Positive for fatigue. Negative for activity change, appetite change, fever and unexpected weight change.  HENT:  Negative for congestion, ear pain, rhinorrhea, sinus pressure and sore throat.   Eyes:  Negative for pain, redness and visual disturbance.  Respiratory:  Negative for cough, shortness of breath and wheezing.   Cardiovascular:  Negative for chest pain and palpitations.  Gastrointestinal:  Negative for abdominal pain, blood in stool, constipation and diarrhea.  Endocrine: Negative for polydipsia and polyuria.  Genitourinary:  Negative for dysuria, frequency and urgency.  Musculoskeletal:  Positive for arthralgias. Negative for back pain and myalgias.  Skin:   Negative for pallor and rash.  Allergic/Immunologic: Negative for environmental allergies.  Neurological:  Negative for dizziness, syncope and headaches.  Hematological:  Negative for adenopathy. Does not bruise/bleed easily.  Psychiatric/Behavioral:  Negative for decreased concentration and dysphoric mood. The patient is not nervous/anxious.        Objective:   Physical Exam Constitutional:      General: She is not in acute distress.    Appearance: Normal appearance. She is well-developed. She is obese. She is not ill-appearing or diaphoretic.  HENT:     Head: Normocephalic and atraumatic.  Eyes:     Conjunctiva/sclera: Conjunctivae normal.     Pupils: Pupils are equal, round, and reactive to light.  Neck:     Thyroid : No thyromegaly.     Vascular: No carotid bruit or JVD.  Cardiovascular:     Rate and Rhythm: Normal rate and regular rhythm.     Heart sounds: Normal heart sounds.     No gallop.  Pulmonary:     Effort: Pulmonary effort is normal. No respiratory distress.     Breath sounds: Normal breath sounds. No wheezing or rales.  Abdominal:     General: There is no distension or abdominal bruit.     Palpations: Abdomen is soft.  Musculoskeletal:     Cervical back: Normal range of motion and neck supple.     Right lower leg: No edema.     Left lower leg: No edema.  Lymphadenopathy:     Cervical: No cervical adenopathy.  Skin:    General: Skin is warm and dry.     Coloration: Skin is not pale.     Findings: No rash.  Neurological:     Mental Status: She is alert.     Sensory: No sensory deficit.     Coordination: Coordination normal.     Deep Tendon Reflexes: Reflexes are normal and symmetric. Reflexes normal.  Psychiatric:  Mood and Affect: Mood normal.           Assessment & Plan:   Problem List Items Addressed This Visit       Cardiovascular and Mediastinum   Essential hypertension   bp is stable today  No cp or palpitations or headaches or  edema  No side effects to medicines  BP Readings from Last 3 Encounters:  01/29/24 135/75  10/22/23 130/80  08/10/23 138/85    Losartan  100 mg daily Furosemide  40 mg daily (aldo for DD) Follow up planned History of microalbuminuria            Endocrine   Type II diabetes mellitus with renal manifestations (HCC) - Primary   Lab Results  Component Value Date   HGBA1C 5.9 (A) 01/29/2024   HGBA1C 6.4 08/03/2023   HGBA1C 5.7 (A) 04/12/2023   Microalbuminuria - we did increase losartan  to 100 mg daily   Semaglutide  0.5 mg weekly (tolerates much better than 1 mg) Encouraged protein and more strength building exercise Metformin  500 mg once daily in am  No low readings  Oph utd  On statin and arb Normal foot exam  Follow up annual exam 6 mo       Relevant Orders   POCT HgB A1C (Completed)   Hyperlipidemia associated with type 2 diabetes mellitus (HCC)   Disc goals for lipids and reasons to control them Rev last labs with pt Rev low sat fat diet in detail  LDL of 49  Continues simvastatin  40 mg daily         Other   Microalbuminuria   Losartan  was increase to 100 mg  Blood pressure is fairly controlled/lower at home  Dm control better  Also glp-1 Watching ckd       Other Visit Diagnoses       Need for influenza vaccination       Relevant Orders   Flu vaccine HIGH DOSE PF(Fluzone Trivalent) (Completed)

## 2024-01-29 NOTE — Assessment & Plan Note (Signed)
 bp is stable today  No cp or palpitations or headaches or edema  No side effects to medicines  BP Readings from Last 3 Encounters:  01/29/24 135/75  10/22/23 130/80  08/10/23 138/85    Losartan  100 mg daily Furosemide  40 mg daily (aldo for DD) Follow up planned History of microalbuminuria

## 2024-01-29 NOTE — Assessment & Plan Note (Signed)
 Losartan  was increase to 100 mg  Blood pressure is fairly controlled/lower at home  Dm control better  Also glp-1 Watching ckd

## 2024-01-29 NOTE — Assessment & Plan Note (Signed)
 Lab Results  Component Value Date   HGBA1C 5.9 (A) 01/29/2024   HGBA1C 6.4 08/03/2023   HGBA1C 5.7 (A) 04/12/2023   Microalbuminuria - we did increase losartan  to 100 mg daily   Semaglutide  0.5 mg weekly (tolerates much better than 1 mg) Encouraged protein and more strength building exercise Metformin  500 mg once daily in am  No low readings  Oph utd  On statin and arb Normal foot exam  Follow up annual exam 6 mo

## 2024-02-26 ENCOUNTER — Other Ambulatory Visit: Payer: Self-pay | Admitting: Family Medicine

## 2024-02-27 NOTE — Telephone Encounter (Signed)
 CPE scheduled 08/11/24  Last filled on 01/23/24 #3 mL/ 0 refills

## 2024-07-30 ENCOUNTER — Ambulatory Visit

## 2024-08-04 ENCOUNTER — Other Ambulatory Visit

## 2024-08-11 ENCOUNTER — Encounter: Admitting: Family Medicine
